# Patient Record
Sex: Female | Born: 1975 | Race: Black or African American | Hispanic: No | Marital: Single | Smoking: Current every day smoker
Health system: Southern US, Community
[De-identification: ages and names within clinical notes are randomized; demographics above are authoritative.]

## PROBLEM LIST (undated history)

## (undated) DIAGNOSIS — Z720 Tobacco use: Secondary | ICD-10-CM

## (undated) DIAGNOSIS — D61818 Other pancytopenia: Secondary | ICD-10-CM

## (undated) DIAGNOSIS — J449 Chronic obstructive pulmonary disease, unspecified: Secondary | ICD-10-CM

## (undated) DIAGNOSIS — N183 Chronic kidney disease, stage 3 unspecified: Secondary | ICD-10-CM

## (undated) DIAGNOSIS — B20 Human immunodeficiency virus [HIV] disease: Secondary | ICD-10-CM

## (undated) DIAGNOSIS — N186 End stage renal disease: Secondary | ICD-10-CM

## (undated) DIAGNOSIS — Z91199 Patient's noncompliance with other medical treatment and regimen due to unspecified reason: Secondary | ICD-10-CM

## (undated) DIAGNOSIS — I429 Cardiomyopathy, unspecified: Secondary | ICD-10-CM

## (undated) DIAGNOSIS — I420 Dilated cardiomyopathy: Secondary | ICD-10-CM

## (undated) DIAGNOSIS — I5020 Unspecified systolic (congestive) heart failure: Secondary | ICD-10-CM

## (undated) DIAGNOSIS — K7460 Unspecified cirrhosis of liver: Secondary | ICD-10-CM

## (undated) DIAGNOSIS — R9431 Abnormal electrocardiogram [ECG] [EKG]: Secondary | ICD-10-CM

## (undated) DIAGNOSIS — Z992 Dependence on renal dialysis: Secondary | ICD-10-CM

## (undated) DIAGNOSIS — I447 Left bundle-branch block, unspecified: Secondary | ICD-10-CM

## (undated) DIAGNOSIS — K769 Liver disease, unspecified: Secondary | ICD-10-CM

## (undated) DIAGNOSIS — I509 Heart failure, unspecified: Secondary | ICD-10-CM

## (undated) DIAGNOSIS — Z7901 Long term (current) use of anticoagulants: Secondary | ICD-10-CM

## (undated) DIAGNOSIS — I10 Essential (primary) hypertension: Secondary | ICD-10-CM

## (undated) DIAGNOSIS — K219 Gastro-esophageal reflux disease without esophagitis: Secondary | ICD-10-CM

## (undated) DIAGNOSIS — U071 COVID-19: Secondary | ICD-10-CM

## (undated) DIAGNOSIS — I749 Embolism and thrombosis of unspecified artery: Secondary | ICD-10-CM

## (undated) DIAGNOSIS — B1920 Unspecified viral hepatitis C without hepatic coma: Secondary | ICD-10-CM

## (undated) DIAGNOSIS — G4700 Insomnia, unspecified: Secondary | ICD-10-CM

## (undated) DIAGNOSIS — R161 Splenomegaly, not elsewhere classified: Secondary | ICD-10-CM

## (undated) HISTORY — DX: Unspecified systolic (congestive) heart failure: I50.20

## (undated) HISTORY — DX: Tobacco use: Z72.0

## (undated) HISTORY — DX: Embolism and thrombosis of unspecified artery: I74.9

## (undated) HISTORY — DX: Left bundle-branch block, unspecified: I44.7

## (undated) HISTORY — DX: Other pancytopenia: D61.818

## (undated) HISTORY — PX: CHOLECYSTECTOMY: SHX55

## (undated) HISTORY — DX: Human immunodeficiency virus (HIV) disease: B20

## (undated) HISTORY — DX: Chronic kidney disease, stage 3 unspecified: N18.30

## (undated) HISTORY — DX: Cardiomyopathy, unspecified: I42.9

---

## 2013-06-15 DIAGNOSIS — Z21 Asymptomatic human immunodeficiency virus [HIV] infection status: Secondary | ICD-10-CM

## 2013-06-15 DIAGNOSIS — B20 Human immunodeficiency virus [HIV] disease: Secondary | ICD-10-CM

## 2013-06-15 HISTORY — DX: Asymptomatic human immunodeficiency virus (hiv) infection status: Z21

## 2013-06-15 HISTORY — DX: Human immunodeficiency virus (HIV) disease: B20

## 2013-11-26 DIAGNOSIS — I5043 Acute on chronic combined systolic (congestive) and diastolic (congestive) heart failure: Secondary | ICD-10-CM | POA: Insufficient documentation

## 2013-11-26 DIAGNOSIS — I509 Heart failure, unspecified: Secondary | ICD-10-CM | POA: Insufficient documentation

## 2013-11-26 DIAGNOSIS — I10 Essential (primary) hypertension: Secondary | ICD-10-CM | POA: Insufficient documentation

## 2013-11-26 DIAGNOSIS — I5023 Acute on chronic systolic (congestive) heart failure: Secondary | ICD-10-CM | POA: Insufficient documentation

## 2013-11-27 DIAGNOSIS — R161 Splenomegaly, not elsewhere classified: Secondary | ICD-10-CM | POA: Insufficient documentation

## 2013-11-27 DIAGNOSIS — J189 Pneumonia, unspecified organism: Secondary | ICD-10-CM | POA: Insufficient documentation

## 2013-11-27 DIAGNOSIS — E871 Hypo-osmolality and hyponatremia: Secondary | ICD-10-CM | POA: Insufficient documentation

## 2013-11-27 DIAGNOSIS — R591 Generalized enlarged lymph nodes: Secondary | ICD-10-CM | POA: Insufficient documentation

## 2013-11-27 DIAGNOSIS — E876 Hypokalemia: Secondary | ICD-10-CM | POA: Insufficient documentation

## 2013-11-28 DIAGNOSIS — Z21 Asymptomatic human immunodeficiency virus [HIV] infection status: Secondary | ICD-10-CM | POA: Insufficient documentation

## 2013-11-28 DIAGNOSIS — Z720 Tobacco use: Secondary | ICD-10-CM | POA: Insufficient documentation

## 2013-11-28 DIAGNOSIS — B20 Human immunodeficiency virus [HIV] disease: Secondary | ICD-10-CM | POA: Insufficient documentation

## 2013-11-29 DIAGNOSIS — B1920 Unspecified viral hepatitis C without hepatic coma: Secondary | ICD-10-CM | POA: Insufficient documentation

## 2015-03-14 DIAGNOSIS — Z9119 Patient's noncompliance with other medical treatment and regimen: Secondary | ICD-10-CM | POA: Insufficient documentation

## 2015-03-14 DIAGNOSIS — Z91199 Patient's noncompliance with other medical treatment and regimen due to unspecified reason: Secondary | ICD-10-CM | POA: Insufficient documentation

## 2015-03-14 DIAGNOSIS — R9431 Abnormal electrocardiogram [ECG] [EKG]: Secondary | ICD-10-CM | POA: Insufficient documentation

## 2015-03-14 DIAGNOSIS — N179 Acute kidney failure, unspecified: Secondary | ICD-10-CM | POA: Insufficient documentation

## 2015-03-14 DIAGNOSIS — D696 Thrombocytopenia, unspecified: Secondary | ICD-10-CM | POA: Insufficient documentation

## 2015-07-12 DIAGNOSIS — R809 Proteinuria, unspecified: Secondary | ICD-10-CM | POA: Insufficient documentation

## 2016-03-03 DIAGNOSIS — R87610 Atypical squamous cells of undetermined significance on cytologic smear of cervix (ASC-US): Secondary | ICD-10-CM | POA: Insufficient documentation

## 2016-03-03 DIAGNOSIS — N879 Dysplasia of cervix uteri, unspecified: Secondary | ICD-10-CM | POA: Insufficient documentation

## 2017-03-17 DIAGNOSIS — R519 Headache, unspecified: Secondary | ICD-10-CM | POA: Insufficient documentation

## 2017-08-07 ENCOUNTER — Inpatient Hospital Stay: Payer: Self-pay

## 2017-08-07 ENCOUNTER — Encounter: Payer: Self-pay | Admitting: Emergency Medicine

## 2017-08-07 ENCOUNTER — Other Ambulatory Visit: Payer: Self-pay

## 2017-08-07 ENCOUNTER — Emergency Department: Payer: Self-pay

## 2017-08-07 ENCOUNTER — Inpatient Hospital Stay
Admission: EM | Admit: 2017-08-07 | Discharge: 2017-08-09 | DRG: 292 | Disposition: A | Discharge status: Home / Self Care | Payer: Medicaid Other | Attending: Internal Medicine | Admitting: Internal Medicine

## 2017-08-07 DIAGNOSIS — N179 Acute kidney failure, unspecified: Secondary | ICD-10-CM | POA: Diagnosis present

## 2017-08-07 DIAGNOSIS — I5022 Chronic systolic (congestive) heart failure: Secondary | ICD-10-CM | POA: Diagnosis present

## 2017-08-07 DIAGNOSIS — F17200 Nicotine dependence, unspecified, uncomplicated: Secondary | ICD-10-CM | POA: Diagnosis present

## 2017-08-07 DIAGNOSIS — E875 Hyperkalemia: Secondary | ICD-10-CM | POA: Diagnosis present

## 2017-08-07 DIAGNOSIS — I447 Left bundle-branch block, unspecified: Secondary | ICD-10-CM | POA: Diagnosis present

## 2017-08-07 DIAGNOSIS — Z9119 Patient's noncompliance with other medical treatment and regimen: Secondary | ICD-10-CM

## 2017-08-07 DIAGNOSIS — I10 Essential (primary) hypertension: Secondary | ICD-10-CM | POA: Diagnosis present

## 2017-08-07 DIAGNOSIS — R918 Other nonspecific abnormal finding of lung field: Secondary | ICD-10-CM | POA: Diagnosis present

## 2017-08-07 DIAGNOSIS — Z79899 Other long term (current) drug therapy: Secondary | ICD-10-CM

## 2017-08-07 DIAGNOSIS — Z882 Allergy status to sulfonamides status: Secondary | ICD-10-CM

## 2017-08-07 DIAGNOSIS — R0789 Other chest pain: Secondary | ICD-10-CM | POA: Diagnosis present

## 2017-08-07 DIAGNOSIS — R809 Proteinuria, unspecified: Secondary | ICD-10-CM | POA: Diagnosis present

## 2017-08-07 DIAGNOSIS — Z9114 Patient's other noncompliance with medication regimen: Secondary | ICD-10-CM

## 2017-08-07 DIAGNOSIS — Z8619 Personal history of other infectious and parasitic diseases: Secondary | ICD-10-CM

## 2017-08-07 DIAGNOSIS — I429 Cardiomyopathy, unspecified: Secondary | ICD-10-CM | POA: Diagnosis present

## 2017-08-07 DIAGNOSIS — Z862 Personal history of diseases of the blood and blood-forming organs and certain disorders involving the immune mechanism: Secondary | ICD-10-CM

## 2017-08-07 DIAGNOSIS — R9431 Abnormal electrocardiogram [ECG] [EKG]: Secondary | ICD-10-CM

## 2017-08-07 DIAGNOSIS — I160 Hypertensive urgency: Secondary | ICD-10-CM | POA: Diagnosis present

## 2017-08-07 DIAGNOSIS — I130 Hypertensive heart and chronic kidney disease with heart failure and stage 1 through stage 4 chronic kidney disease, or unspecified chronic kidney disease: Principal | ICD-10-CM | POA: Diagnosis present

## 2017-08-07 DIAGNOSIS — Z21 Asymptomatic human immunodeficiency virus [HIV] infection status: Secondary | ICD-10-CM | POA: Diagnosis present

## 2017-08-07 DIAGNOSIS — N183 Chronic kidney disease, stage 3 (moderate): Secondary | ICD-10-CM | POA: Diagnosis present

## 2017-08-07 HISTORY — DX: Essential (primary) hypertension: I10

## 2017-08-07 LAB — POCT PREGNANCY, URINE: PREG TEST UR: NEGATIVE

## 2017-08-07 LAB — POTASSIUM: POTASSIUM: 4.3 mmol/L (ref 3.5–5.1)

## 2017-08-07 LAB — BASIC METABOLIC PANEL
Anion gap: 5 (ref 5–15)
BUN: 32 mg/dL — AB (ref 6–20)
CHLORIDE: 108 mmol/L (ref 101–111)
CO2: 21 mmol/L — ABNORMAL LOW (ref 22–32)
Calcium: 8.6 mg/dL — ABNORMAL LOW (ref 8.9–10.3)
Creatinine, Ser: 1.9 mg/dL — ABNORMAL HIGH (ref 0.44–1.00)
GFR calc Af Amer: 37 mL/min — ABNORMAL LOW (ref >60)
GFR calc non Af Amer: 32 mL/min — ABNORMAL LOW (ref >60)
Glucose, Bld: 89 mg/dL (ref 65–99)
Potassium: 5.9 mmol/L — ABNORMAL HIGH (ref 3.5–5.1)
SODIUM: 134 mmol/L — AB (ref 135–145)

## 2017-08-07 LAB — URINALYSIS, COMPLETE (UACMP) WITH MICROSCOPIC
Bacteria, UA: NONE SEEN
Bilirubin Urine: NEGATIVE
Glucose, UA: NEGATIVE mg/dL
KETONES UR: NEGATIVE mg/dL
LEUKOCYTES UA: NEGATIVE
Nitrite: NEGATIVE
PROTEIN: 30 mg/dL — AB
Specific Gravity, Urine: 1.011 (ref 1.005–1.030)
pH: 6 (ref 5.0–8.0)

## 2017-08-07 LAB — CBC
HCT: 39.7 % (ref 35.0–47.0)
HCT: 39.7 % (ref 35.0–47.0)
Hemoglobin: 13.4 g/dL (ref 12.0–16.0)
Hemoglobin: 13.3 g/dL (ref 12.0–16.0)
MCH: 34.3 pg — ABNORMAL HIGH (ref 26.0–34.0)
MCH: 33.6 pg (ref 26.0–34.0)
MCHC: 33.8 g/dL (ref 32.0–36.0)
MCHC: 33.5 g/dL (ref 32.0–36.0)
MCV: 101.3 fL — AB (ref 80.0–100.0)
MCV: 100.4 fL — AB (ref 80.0–100.0)
PLATELETS: 112 10*3/uL — AB (ref 150–440)
Platelets: 126 10*3/uL — ABNORMAL LOW (ref 150–440)
RBC: 3.92 MIL/uL (ref 3.80–5.20)
RBC: 3.96 MIL/uL (ref 3.80–5.20)
RDW: 13.9 % (ref 11.5–14.5)
RDW: 14.2 % (ref 11.5–14.5)
WBC: 4.9 10*3/uL (ref 3.6–11.0)
WBC: 6.6 10*3/uL (ref 3.6–11.0)

## 2017-08-07 LAB — TROPONIN I
Troponin I: <0.03 ng/mL (ref <0.03)
Troponin I: <0.03 ng/mL (ref <0.03)

## 2017-08-07 LAB — CREATININE, SERUM
Creatinine, Ser: 1.53 mg/dL — ABNORMAL HIGH (ref 0.44–1.00)
GFR calc Af Amer: 48 mL/min — ABNORMAL LOW (ref >60)
GFR calc non Af Amer: 42 mL/min — ABNORMAL LOW (ref >60)

## 2017-08-07 LAB — URINE DRUG SCREEN, QUALITATIVE (ARMC ONLY)
AMPHETAMINES, UR SCREEN: NOT DETECTED
BARBITURATES, UR SCREEN: NOT DETECTED
BENZODIAZEPINE, UR SCRN: NOT DETECTED
CANNABINOID 50 NG, UR ~~LOC~~: POSITIVE — AB
Cocaine Metabolite,Ur ~~LOC~~: NOT DETECTED
MDMA (Ecstasy)Ur Screen: NOT DETECTED
Methadone Scn, Ur: NOT DETECTED
Opiate, Ur Screen: NOT DETECTED
Phencyclidine (PCP) Ur S: NOT DETECTED
Tricyclic, Ur Screen: NOT DETECTED

## 2017-08-07 LAB — LIPASE, BLOOD
LIPASE: 57 U/L — AB (ref 11–51)
Lipase: 50 U/L (ref 11–51)

## 2017-08-07 LAB — HEPATIC FUNCTION PANEL
ALBUMIN: 3.7 g/dL (ref 3.5–5.0)
ALK PHOS: 57 U/L (ref 38–126)
ALT: 24 U/L (ref 14–54)
AST: 35 U/L (ref 15–41)
BILIRUBIN TOTAL: 0.6 mg/dL (ref 0.3–1.2)
Bilirubin, Direct: <0.1 mg/dL — ABNORMAL LOW (ref 0.1–0.5)
TOTAL PROTEIN: 8.8 g/dL — AB (ref 6.5–8.1)

## 2017-08-07 LAB — BRAIN NATRIURETIC PEPTIDE: B Natriuretic Peptide: 378 pg/mL — ABNORMAL HIGH (ref 0.0–100.0)

## 2017-08-07 LAB — GLUCOSE, CAPILLARY
GLUCOSE-CAPILLARY: 99 mg/dL (ref 65–99)
Glucose-Capillary: 58 mg/dL — ABNORMAL LOW (ref 65–99)

## 2017-08-07 LAB — PROCALCITONIN

## 2017-08-07 MED ORDER — CALCIUM GLUCONATE 10 % IV SOLN
1.0000 g | Freq: Once | INTRAVENOUS | Status: AC
Start: 2017-08-07 — End: 2017-08-07
  Administered 2017-08-07: 1 g via INTRAVENOUS
  Filled 2017-08-07: qty 10

## 2017-08-07 MED ORDER — ASPIRIN EC 325 MG PO TBEC
325.0000 mg | DELAYED_RELEASE_TABLET | Freq: Every day | ORAL | Status: DC
  Administered 2017-08-07 – 2017-08-09 (×3): 325 mg via ORAL
  Filled 2017-08-07 (×3): qty 1

## 2017-08-07 MED ORDER — SODIUM CHLORIDE 0.9% FLUSH
3.0000 mL | Freq: Two times a day (BID) | INTRAVENOUS | Status: DC
  Administered 2017-08-07 – 2017-08-09 (×4): 3 mL via INTRAVENOUS

## 2017-08-07 MED ORDER — SODIUM BICARBONATE 8.4 % IV SOLN
50.0000 meq | Freq: Once | INTRAVENOUS | Status: AC
  Administered 2017-08-07: 50 meq via INTRAVENOUS
  Filled 2017-08-07: qty 50

## 2017-08-07 MED ORDER — SODIUM CHLORIDE 0.9% FLUSH
3.0000 mL | INTRAVENOUS | Status: DC | PRN
  Administered 2017-08-08: 3 mL via INTRAVENOUS
  Filled 2017-08-07: qty 3

## 2017-08-07 MED ORDER — DEXTROSE 50 % IV SOLN
1.0000 | Freq: Once | INTRAVENOUS | Status: AC
  Administered 2017-08-07: 50 mL via INTRAVENOUS
  Filled 2017-08-07: qty 50

## 2017-08-07 MED ORDER — HYDROCODONE-ACETAMINOPHEN 5-325 MG PO TABS
1.0000 | ORAL_TABLET | ORAL | Status: DC | PRN
  Administered 2017-08-08 – 2017-08-09 (×3): 2 via ORAL
  Filled 2017-08-07: qty 2
  Filled 2017-08-07 (×2): qty 1
  Filled 2017-08-07: qty 2

## 2017-08-07 MED ORDER — SODIUM POLYSTYRENE SULFONATE 15 GM/60ML PO SUSP
30.0000 g | Freq: Once | ORAL | Status: AC
  Administered 2017-08-07: 30 g via ORAL
  Filled 2017-08-07: qty 120

## 2017-08-07 MED ORDER — AMLODIPINE BESYLATE 10 MG PO TABS
10.0000 mg | ORAL_TABLET | Freq: Every day | ORAL | Status: DC
  Administered 2017-08-07 – 2017-08-09 (×3): 10 mg via ORAL
  Filled 2017-08-07: qty 2
  Filled 2017-08-07 (×2): qty 1

## 2017-08-07 MED ORDER — SODIUM CHLORIDE 0.9 % IV SOLN: 250.0000 mL | INTRAVENOUS | Status: DC | PRN

## 2017-08-07 MED ORDER — MORPHINE SULFATE (PF) 2 MG/ML IV SOLN: 2.0000 mg | INTRAVENOUS | Status: DC | PRN

## 2017-08-07 MED ORDER — HYDRALAZINE HCL 20 MG/ML IJ SOLN
10.0000 mg | INTRAMUSCULAR | Status: DC | PRN
  Administered 2017-08-07: 10 mg via INTRAVENOUS
  Filled 2017-08-07: qty 1

## 2017-08-07 MED ORDER — CLONIDINE HCL 0.1 MG PO TABS: 0.1000 mg | ORAL_TABLET | Freq: Once | ORAL | Status: DC

## 2017-08-07 MED ORDER — HEPARIN SODIUM (PORCINE) 5000 UNIT/ML IJ SOLN
5000.0000 [IU] | Freq: Three times a day (TID) | INTRAMUSCULAR | Status: DC
  Administered 2017-08-07 – 2017-08-09 (×6): 5000 [IU] via SUBCUTANEOUS
  Filled 2017-08-07 (×6): qty 1

## 2017-08-07 MED ORDER — NITROGLYCERIN 0.4 MG SL SUBL
0.4000 mg | SUBLINGUAL_TABLET | SUBLINGUAL | Status: DC | PRN
Start: 2017-08-07 — End: 2017-08-09

## 2017-08-07 MED ORDER — HYDRALAZINE HCL 50 MG PO TABS
50.0000 mg | ORAL_TABLET | ORAL | Status: AC
  Administered 2017-08-07: 50 mg via ORAL
  Filled 2017-08-07: qty 1

## 2017-08-07 MED ORDER — ONDANSETRON HCL 4 MG/2ML IJ SOLN: 4.0000 mg | Freq: Four times a day (QID) | INTRAMUSCULAR | Status: DC | PRN

## 2017-08-07 MED ORDER — HYDRALAZINE HCL 20 MG/ML IJ SOLN
20.0000 mg | INTRAMUSCULAR | Status: DC | PRN
  Administered 2017-08-07 – 2017-08-08 (×2): 20 mg via INTRAVENOUS
  Filled 2017-08-07 (×2): qty 1

## 2017-08-07 MED ORDER — NICOTINE 14 MG/24HR TD PT24
14.0000 mg | MEDICATED_PATCH | Freq: Every day | TRANSDERMAL | Status: DC
  Administered 2017-08-07 – 2017-08-09 (×3): 14 mg via TRANSDERMAL
  Filled 2017-08-07 (×3): qty 1

## 2017-08-07 MED ORDER — CARVEDILOL 12.5 MG PO TABS
12.5000 mg | ORAL_TABLET | Freq: Two times a day (BID) | ORAL | Status: DC
  Administered 2017-08-07 – 2017-08-08 (×2): 12.5 mg via ORAL
  Filled 2017-08-07 (×2): qty 1

## 2017-08-07 MED ORDER — ACETAMINOPHEN 325 MG PO TABS
650.0000 mg | ORAL_TABLET | Freq: Four times a day (QID) | ORAL | Status: DC | PRN
Start: 2017-08-07 — End: 2017-08-09
  Filled 2017-08-07: qty 2

## 2017-08-07 MED ORDER — INSULIN ASPART 100 UNIT/ML ~~LOC~~ SOLN
4.0000 [IU] | Freq: Once | SUBCUTANEOUS | Status: AC
  Administered 2017-08-07: 4 [IU] via INTRAVENOUS
  Filled 2017-08-07: qty 1

## 2017-08-07 MED ORDER — ACETAMINOPHEN 650 MG RE SUPP: 650.0000 mg | Freq: Four times a day (QID) | RECTAL | Status: DC | PRN

## 2017-08-07 MED ORDER — ONDANSETRON HCL 4 MG PO TABS: 4.0000 mg | ORAL_TABLET | Freq: Four times a day (QID) | ORAL | Status: DC | PRN

## 2017-08-07 MED ORDER — DOCUSATE SODIUM 100 MG PO CAPS
100.0000 mg | ORAL_CAPSULE | Freq: Two times a day (BID) | ORAL | Status: DC
  Filled 2017-08-07 (×3): qty 1

## 2017-08-07 NOTE — Consult Note (Signed)
Reason for Consult: Chest pain Referring Physician: [facility] hospitalist primary care at [place] is an 42 y.o. female.  HPI: Patient 42 year old female known congestive heart failure hepatitis C noncompliance chronic renal insufficiency smoking severe hypertension reportedly complains of poorly controlled hypertension over the last 5 days.  Patient also complains of vague left chest pressure made better with activity and ambulation worse with sitting still.  Denies much in way of shortness of breath or diaphoresis but came to the emergency room and most recent EKG now suggested left bundle branch block which had not been noticed before.  Patient also also was found to have hyperkalemia denies any known coronary disease.  Past Medical History:  Diagnosis Date  . Hypertension     Past Surgical History:  Procedure Laterality Date  . CHOLECYSTECTOMY      No family history on file.  Social History:  reports that she has been smoking.  she has never used smokeless tobacco. She reports that she does not drink alcohol or use drugs.  Allergies:  Allergies  Allergen Reactions  . Sulfa Antibiotics     Medications: I have reviewed the patient's current medications.  Results for orders placed or performed during the hospital encounter of 08/07/17 (from the past 48 hour(s))  Basic metabolic panel     Status: Abnormal   Collection Time: 08/07/17 10:20 AM  Result Value Ref Range   Sodium 134 (L) 135 - 145 mmol/L   Potassium 5.9 (H) 3.5 - 5.1 mmol/L   Chloride 108 101 - 111 mmol/L   CO2 21 (L) 22 - 32 mmol/L   Glucose, Bld 89 65 - 99 mg/dL   BUN 32 (H) 6 - 20 mg/dL   Creatinine, Ser 1.90 (H) 0.44 - 1.00 mg/dL   Calcium 8.6 (L) 8.9 - 10.3 mg/dL   GFR calc non Af Amer 32 (L) >60 mL/min   GFR calc Af Amer 37 (L) >60 mL/min    Comment: (NOTE) The eGFR has been calculated using the CKD EPI equation. This calculation has not been validated in all clinical situations. eGFR's  persistently <60 mL/min signify possible Chronic Kidney Disease.    Anion gap 5 5 - 15    Comment: Performed at [facility], [address] [place], [place] [address]  CBC     Status: Abnormal   Collection Time: 08/07/17 10:20 AM  Result Value Ref Range   WBC 4.9 3.6 - 11.0 K/uL   RBC 3.92 3.80 - 5.20 MIL/uL   Hemoglobin 13.4 12.0 - 16.0 g/dL   HCT 39.7 35.0 - 47.0 %   MCV 101.3 (H) 80.0 - 100.0 fL   MCH 34.3 (H) 26.0 - 34.0 pg   MCHC 33.8 32.0 - 36.0 g/dL   RDW 13.9 11.5 - 14.5 %   Platelets 126 (L) 150 - 440 K/uL    Comment: Performed at [facility], [address] [place], [place] [address]  Troponin I     Status: None   Collection Time: 08/07/17 10:20 AM  Result Value Ref Range   Troponin I <0.03 <0.03 ng/mL    Comment: Performed at [facility], [address] [place], [place] [address]  Hepatic function panel     Status: Abnormal   Collection Time: 08/07/17 10:20 AM  Result Value Ref Range   Total Protein 8.8 (H) 6.5 - 8.1 g/dL   Albumin 3.7 3.5 - 5.0 g/dL   AST 35 15 - 41 U/L  ALT 24 14 - 54 U/L   Alkaline Phosphatase 57 38 - 126 U/L   Total Bilirubin 0.6 0.3 - 1.2 mg/dL   Bilirubin, Direct <0.1 (L) 0.1 - 0.5 mg/dL   Indirect Bilirubin NOT CALCULATED 0.3 - 0.9 mg/dL    Comment: Performed at [facility], [address] [place], [place] [address]  Lipase, blood     Status: None   Collection Time: 08/07/17 10:20 AM  Result Value Ref Range   Lipase 50 11 - 51 U/L    Comment: Performed at [facility], [address] [place], [place] [address]  Urinalysis, Complete w Microscopic     Status: Abnormal   Collection Time: 08/07/17 11:35 AM  Result Value Ref Range   Color, Urine YELLOW (A) YELLOW   APPearance CLEAR (A) CLEAR   Specific Gravity, Urine 1.011 1.005 - 1.030   pH 6.0 5.0 - 8.0   Glucose, UA NEGATIVE NEGATIVE mg/dL   Hgb urine dipstick MODERATE (A) NEGATIVE   Bilirubin Urine NEGATIVE NEGATIVE    Ketones, ur NEGATIVE NEGATIVE mg/dL   Protein, ur 30 (A) NEGATIVE mg/dL   Nitrite NEGATIVE NEGATIVE   Leukocytes, UA NEGATIVE NEGATIVE   RBC / HPF 0-5 0 - 5 RBC/hpf   WBC, UA 0-5 0 - 5 WBC/hpf   Bacteria, UA NONE SEEN NONE SEEN   Squamous Epithelial / LPF 0-5 (A) NONE SEEN    Comment: Performed at [facility], [address] [place], [place] [address]  Pregnancy, urine POC     Status: None   Collection Time: 08/07/17 12:37 PM  Result Value Ref Range   Preg Test, Ur NEGATIVE NEGATIVE    Comment:        THE SENSITIVITY OF THIS METHODOLOGY IS >24 mIU/mL   Glucose, capillary     Status: Abnormal   Collection Time: 08/07/17  1:02 PM  Result Value Ref Range   Glucose-Capillary 58 (L) 65 - 99 mg/dL  Potassium     Status: None   Collection Time: 08/07/17  3:30 PM  Result Value Ref Range   Potassium 4.3 3.5 - 5.1 mmol/L    Comment: Performed at [facility], [address] [place], [place] [address]  Lipase, blood     Status: Abnormal   Collection Time: 08/07/17  3:30 PM  Result Value Ref Range   Lipase 57 (H) 11 - 51 U/L    Comment: Performed at [facility], [address] [place], [place] [address]  CBC     Status: Abnormal   Collection Time: 08/07/17  3:30 PM  Result Value Ref Range   WBC 6.6 3.6 - 11.0 K/uL   RBC 3.96 3.80 - 5.20 MIL/uL   Hemoglobin 13.3 12.0 - 16.0 g/dL   HCT 39.7 35.0 - 47.0 %   MCV 100.4 (H) 80.0 - 100.0 fL   MCH 33.6 26.0 - 34.0 pg   MCHC 33.5 32.0 - 36.0 g/dL   RDW 14.2 11.5 - 14.5 %   Platelets 112 (L) 150 - 440 K/uL    Comment: Performed at [facility], [address] [place], [place] [address]  Creatinine, serum     Status: Abnormal   Collection Time: 08/07/17  3:30 PM  Result Value Ref Range   Creatinine, Ser 1.53 (H) 0.44 - 1.00 mg/dL   GFR calc non Af Amer 42 (L) >60 mL/min   GFR calc Af Amer 48 (L) >60 mL/min    Comment: (NOTE) The  eGFR has been calculated using the CKD EPI equation. This  calculation has not been validated in all clinical situations. eGFR's persistently <60 mL/min signify possible Chronic Kidney Disease. Performed at [facility], [address] [place], [place] [address]   Troponin I     Status: None   Collection Time: 08/07/17  3:30 PM  Result Value Ref Range   Troponin I <0.03 <0.03 ng/mL    Comment: Performed at [facility], [address] [place], [place] [address]  Brain natriuretic peptide     Status: Abnormal   Collection Time: 08/07/17  3:30 PM  Result Value Ref Range   B Natriuretic Peptide 378.0 (H) 0.0 - 100.0 pg/mL    Comment: Performed at [facility], [address] [place], [place] [address]  Procalcitonin - Baseline     Status: None   Collection Time: 08/07/17  3:30 PM  Result Value Ref Range   Procalcitonin <0.10 ng/mL    Comment:        Interpretation: PCT (Procalcitonin) <= 0.5 ng/mL: Systemic infection (sepsis) is not likely. Local bacterial infection is possible. (NOTE)       Sepsis PCT Algorithm           Lower Respiratory Tract                                      Infection PCT Algorithm    ----------------------------     ----------------------------         PCT < 0.25 ng/mL                PCT < 0.10 ng/mL         Strongly encourage             Strongly discourage   discontinuation of antibiotics    initiation of antibiotics    ----------------------------     -----------------------------       PCT 0.25 - 0.50 ng/mL            PCT 0.10 - 0.25 ng/mL               OR       >80% decrease in PCT            Discourage initiation of                                            antibiotics      Encourage discontinuation           of antibiotics    ----------------------------     -----------------------------         PCT >= 0.50 ng/mL              PCT 0.26 - 0.50 ng/mL               AND        <80% decrease in PCT             Encourage initiation of                                              antibiotics  Encourage continuation           of antibiotics    ----------------------------     -----------------------------        PCT >= 0.50 ng/mL                  PCT > 0.50 ng/mL               AND         increase in PCT                  Strongly encourage                                      initiation of antibiotics    Strongly encourage escalation           of antibiotics                                     -----------------------------                                           PCT <= 0.25 ng/mL                                                 OR                                        > 80% decrease in PCT                                     Discontinue / Do not initiate                                             antibiotics Performed at [facility], [address] [place], [place] [address]   Glucose, capillary     Status: None   Collection Time: 08/07/17  5:24 PM  Result Value Ref Range   Glucose-Capillary 99 65 - 99 mg/dL    Dg Chest 2 View  Result Date: 08/07/2017 CLINICAL DATA:  Left chest pain EXAM: CHEST  2 VIEW COMPARISON:  None FINDINGS: Indistinct asymmetric opacities at the left lung base best appreciated on the frontal projection. The lungs appear otherwise clear. Cardiac and mediastinal margins appear normal. No pleural effusion. IMPRESSION: 1. Indistinct streaky opacities at the left lung base. The appearance is nonspecific but early bronchopneumonia is a possibility. Electronically Signed   By: Van Clines M.D.   On: 08/07/2017 11:12   Ct Chest Wo Contrast  Result Date: 08/07/2017 CLINICAL DATA:  42 year old female with LEFT chest pain for 4 days. EXAM: CT CHEST WITHOUT CONTRAST TECHNIQUE: Multidetector CT imaging of the chest was performed following the standard protocol without IV contrast. COMPARISON:  08/07/2017 chest radiograph FINDINGS:  Cardiovascular: Heart size normal. No evidence of thoracic aortic aneurysm or pericardial  effusion. Mediastinum/Nodes: No enlarged mediastinal or axillary lymph nodes. Thyroid gland, trachea, and esophagus demonstrate no significant findings. Lungs/Pleura: Lungs are clear. No pleural effusion or pneumothorax. Upper Abdomen: No acute abnormality. Musculoskeletal: No chest wall mass or suspicious bone lesions identified. IMPRESSION: Unremarkable noncontrast chest CT.  No evidence of pneumonia. Electronically Signed   By: Margarette Canada M.D.   On: 08/07/2017 18:49    Review of Systems  Constitutional: Positive for malaise/fatigue.  HENT: Positive for congestion.   Eyes: Negative.   Respiratory: Positive for shortness of breath.   Cardiovascular: Positive for chest pain.  Gastrointestinal: Positive for heartburn.  Genitourinary: Negative.   Musculoskeletal: Positive for myalgias.  Skin: Negative.   Neurological: Positive for weakness.  Endo/Heme/Allergies: Negative.   Psychiatric/Behavioral: Negative.    Blood pressure (!) 171/97, pulse 93, temperature 98.6 F (37 C), temperature source Oral, resp. rate 18, height 5' 3"  (1.6 m), weight 142 lb 11.2 oz (64.7 kg), last menstrual period 08/06/2017, SpO2 100 %. Physical Exam  Nursing note and vitals reviewed. Constitutional: She is oriented to person, place, and time. She appears well-developed and well-nourished.  HENT:  Head: Normocephalic and atraumatic.  Eyes: Conjunctivae and EOM are normal. Pupils are equal, round, and reactive to light.  Neck: Normal range of motion. Neck supple.  Cardiovascular: Normal rate and regular rhythm.  Murmur heard. Respiratory: Effort normal and breath sounds normal.  GI: Soft. Bowel sounds are normal.  Musculoskeletal: Normal range of motion.  Neurological: She is alert and oriented to person, place, and time. She has normal reflexes.  Skin: Skin is warm and dry.  Psychiatric: She has a normal mood and affect.    Assessment/Plan: Chest pain Congestive heart failure by history  compensated Hypertension Hepatitis C Chronic renal insufficiency Hyperkalemia New left bundle branch block Abnormal chest x-ray Smoking . Plan Agree with admit to telemetry follow-up EKGs troponin Consider echocardiogram for assessment of left ventricular function Consider nephrology consult for renal insufficiency Atypical chest pain unclear consider functional study do not recommend cardiac cath Recommend patient refrain from tobacco abuse Treat hyperkalemia with Kayexalate Consider functional study either as an inpatient outpatient  Laiden Milles D Tracy Gerken 08/07/2017, 7:39 PM

## 2017-08-07 NOTE — ED Notes (Signed)
Attempted to call report and was advised that nurse would need to return the call

## 2017-08-07 NOTE — ED Notes (Signed)
Admitting doctor notified of pt BP and he states that he will increase apresoline orders

## 2017-08-07 NOTE — H&P (Signed)
[place] [facility] [place] NAME: Adaliah Hiegel    MR#:  [id number]  DATE OF BIRTH:  1976-04-08  DATE OF ADMISSION:  08/07/2017  PRIMARY CARE PHYSICIAN: No primary care provider on file.   REQUESTING/REFERRING PHYSICIAN:   CHIEF COMPLAINT:   Chief Complaint  Patient presents with  . chest discomfort  Informant-the patient, the patient's significant other, medical records from [place]: Karigan Cloninger  is a 42 y.o. female with a known history systolic congestive heart failure, hepatitis C, HIV diagnosed in 2015-patient denies this, noncompliance of medical management, chronic kidney disease with proteinuria, chronic tobacco smoking abuse/dependency, splenomegaly, severe hypertension, presented to the emergency room with 4-5-day history of left chest pressure/pain, made better with activity/ambulation, worse when sitting still, not associated with shortness of breath or diaphoresis, in the emergency room patient was found to have new left bundle branch block on EKG with peaked T waves, chest x-ray noted for left early pneumonia, potassium 5.9, creatinine 1.9-baseline unknown, no other records in our system other than today's studies, we do have records via care everywhere with [facility], patient evaluated in the emergency room with her significant other, patient stated that she moved to the area within the last month, patient is now been admitted for acute malignant hypertension, atypical chest pain with new left bundle branch block, abnormal x-ray of the chest, and hyperkalemia.  PAST MEDICAL HISTORY:   Past Medical History:  Diagnosis Date  . Hypertension   See HPI  PAST SURGICAL HISTORY:  Past Surgical History:  Procedure Laterality Date  . CHOLECYSTECTOMY      SOCIAL HISTORY:  Social History   Tobacco Use  . Smoking status: Current Some Day Smoker  . Smokeless tobacco: Never Used  Substance Use Topics   . Alcohol use: No    Frequency: Never    FAMILY HISTORY:  Hypertension  DRUG ALLERGIES:  Allergies  Allergen Reactions  . Sulfa Antibiotics     REVIEW OF SYSTEMS:   CONSTITUTIONAL: No fever, fatigue or weakness.  EYES: No blurred or double vision.  EARS, NOSE, AND THROAT: No tinnitus or ear pain.  RESPIRATORY: No cough, shortness of breath, wheezing or hemoptysis.  CARDIOVASCULAR: + chest pain,no orthopnea, edema.  GASTROINTESTINAL: No nausea, vomiting, diarrhea or abdominal pain.  GENITOURINARY: No dysuria, hematuria.  ENDOCRINE: No polyuria, nocturia,  HEMATOLOGY: No anemia, easy bruising or bleeding SKIN: No rash or lesion. MUSCULOSKELETAL: No joint pain or arthritis.   NEUROLOGIC: No tingling, numbness, weakness.  PSYCHIATRY: No anxiety or depression.   MEDICATIONS AT HOME:  Prior to Admission medications   Not on File      PHYSICAL EXAMINATION:   VITAL SIGNS: Blood pressure (!) 203/123, pulse 82, temperature 98.6 F (37 C), temperature source Oral, resp. rate 15, weight 60.3 kg (133 lb), last menstrual period 08/06/2017, SpO2 98 %.  GENERAL:  42 y.o.-year-old patient lying in the bed with no acute distress.  EYES: Pupils equal, round, reactive to light and accommodation. No scleral icterus. Extraocular muscles intact.  HEENT: Head atraumatic, normocephalic. Oropharynx and nasopharynx clear.  NECK:  Supple, no jugular venous distention. No thyroid enlargement, no tenderness.  LUNGS: Normal breath sounds bilaterally, no wheezing, rales,rhonchi or crepitation. No use of accessory muscles of respiration.  CARDIOVASCULAR: S1, S2 normal. No murmurs, rubs, or gallops.  ABDOMEN: Soft, nontender, nondistended. Bowel sounds present. No organomegaly or mass.  EXTREMITIES: No pedal edema, cyanosis, or clubbing.  NEUROLOGIC:  Cranial nerves II through XII are intact. Muscle strength 5/5 in all extremities. Sensation intact. Gait not checked.  PSYCHIATRIC: The patient is  alert and oriented x 3.  SKIN: No obvious rash, lesion, or ulcer.   LABORATORY PANEL:   CBC Recent Labs  Lab 08/07/17 1020  WBC 4.9  HGB 13.4  HCT 39.7  PLT 126*  MCV 101.3*  MCH 34.3*  MCHC 33.8  RDW 13.9   ------------------------------------------------------------------------------------------------------------------  Chemistries  Recent Labs  Lab 08/07/17 1020  NA 134*  K 5.9*  CL 108  CO2 21*  GLUCOSE 89  BUN 32*  CREATININE 1.90*  CALCIUM 8.6*  AST 35  ALT 24  ALKPHOS 57  BILITOT 0.6   ------------------------------------------------------------------------------------------------------------------ CrCl cannot be calculated (Unknown ideal weight.). ------------------------------------------------------------------------------------------------------------------ No results for input(s): TSH, T4TOTAL, T3FREE, THYROIDAB in the last 72 hours.  Invalid input(s): FREET3   Coagulation profile No results for input(s): INR, PROTIME in the last 168 hours. ------------------------------------------------------------------------------------------------------------------- No results for input(s): DDIMER in the last 72 hours. -------------------------------------------------------------------------------------------------------------------  Cardiac Enzymes Recent Labs  Lab 08/07/17 1020  TROPONINI <0.03   ------------------------------------------------------------------------------------------------------------------ Invalid input(s): POCBNP  ---------------------------------------------------------------------------------------------------------------  Urinalysis    Component Value Date/Time   COLORURINE YELLOW (A) 08/07/2017 1135   APPEARANCEUR CLEAR (A) 08/07/2017 1135   LABSPEC 1.011 08/07/2017 1135   PHURINE 6.0 08/07/2017 1135   GLUCOSEU NEGATIVE 08/07/2017 1135   HGBUR MODERATE (A) 08/07/2017 1135   BILIRUBINUR NEGATIVE 08/07/2017 1135    KETONESUR NEGATIVE 08/07/2017 1135   PROTEINUR 30 (A) 08/07/2017 1135   NITRITE NEGATIVE 08/07/2017 1135   LEUKOCYTESUR NEGATIVE 08/07/2017 1135     RADIOLOGY: Dg Chest 2 View  Result Date: 08/07/2017 CLINICAL DATA:  Left chest pain EXAM: CHEST  2 VIEW COMPARISON:  None FINDINGS: Indistinct asymmetric opacities at the left lung base best appreciated on the frontal projection. The lungs appear otherwise clear. Cardiac and mediastinal margins appear normal. No pleural effusion. IMPRESSION: 1. Indistinct streaky opacities at the left lung base. The appearance is nonspecific but early bronchopneumonia is a possibility. Electronically Signed   By: Van Clines M.D.   On: 08/07/2017 11:12    EKG: Orders placed or performed during the hospital encounter of 08/07/17  . ED EKG within 10 minutes  . ED EKG within 10 minutes    IMPRESSION AND PLAN: 1 acute malignant hypertension With associated atypical chest pain, chronic kidney disease-most likely representing hypertensive nephrosclerosis, and chronic systolic congestive heart failure Most likely secondary to chronic noncompliance of medical management Admit to regular nursing for bed, Coreg twice daily given history of systolic congestive heart failure, Norvasc daily, IV hydralazine as needed systolic blood pressure greater than 160, low-sodium diet, vitals per routine, make changes as per necessary  2 acute atypical CP w / new left BBB ?  Secondary to above Aspirin daily, supplemental oxygen as needed, nitrates as needed, morphine IV as needed breakthrough pain, Coreg, avoid ACE/ARB therapy given renal insufficiency, check echocardiogram, rule out acute coronary syndrome with cardiac enzymes x3 sets, consult cardiology for expert opinion, and all other plans as stated above  3 acute hyperkalemia Did receive IV sodium bicarb, Kayexalate, IV insulin, calcium gluconate in the emergency room Recheck potassium level at 1500 hrs.  4 probable  chronic hypertensive nephrosclerosis Nephrology consult with expert opinion, strict I&O monitoring, daily weights  5 history of HIV, per [facility] records Patient disputes this Check HIV screen, CD4 count, and consider infectious disease consultation if confirmed  6 acute abnormal  chest x-ray ?  Early left pneumonia though patient is without infectious symptomatology Check CT chest for further evaluation  7 chronic tobacco smoking abuse/dependency Nicotine patch daily with cessation counseling  8 chronic systolic congestive heart failure without exacerbation Ejection fraction 45% based on [facility] records Will repeat echocardiogram, aspirin daily  9 history of hepatitis C Stable  10 history of splenomegaly Stable   All the records are reviewed and case discussed with ED provider. Management plans discussed with the patient, family and they are in agreement.  CODE STATUS:full Code Status History    This patient does not have a recorded code status. Please follow your organizational policy for patients in this situation.       TOTAL TIME TAKING CARE OF THIS PATIENT: 45 minutes of critical care time was used to interview patient and her significant other, discuss plan of care with subspecialty staff/ancillary staff, review outside medical records as well as medical chart, all questions answered.    Avel Peace Isabeau Mccalla M.D on 08/07/2017   Between 7am to 6pm - Pager - [phone]  After 6pm go to [email or web address] - password [email or web address] [facility]  Office  [phone]  CC: Primary care physician; No primary care provider on file.   Note: This dictation was prepared with Dragon dictation along with smaller phrase technology. Any transcriptional errors that result from this process are unintentional.

## 2017-08-07 NOTE — ED Provider Notes (Signed)
[facility] Department Provider Note   ____________________________________________   First MD Initiated Contact with Patient 08/07/17 1124     (approximate)  I have reviewed the triage vital signs and the nursing notes.   HISTORY  Chief Complaint chest discomfort    HPI Rebecca Bridges is a 42 y.o. female a history of cardia myopathy, hepatitis, renal insufficiency, severe hypertension  Patient presents today for evaluation of slight discomfort in her left mid chest.  She reports is very hard to understand the feeling and describe it.  Does not radiate.  Not associated any shortness of breath.  Spin persistent for about 4-5 days.  She reports it feels like a "gas bubble" underneath her left breast.  It is not notably painful, she reported seems to have some relief with taking things like antacids.  Also discussed the patient, she reports she had follow-up with [facility], they have noted that she has had slightly elevated potassium in the past.  Also has been working to try to get her blood pressure improved.  She recently ran out of her lisinopril.  Denies headache.  No nausea or vomiting.  No fevers or chills.  Denies pregnancy.  Currently on her menstrual cycle.  Reports no abdominal pain, or other feels like a "bubble" feeling just below her left breast.  Past Medical History:  Diagnosis Date  . Hypertension     Patient Active Problem List   Diagnosis Date Noted  . HTN (hypertension), malignant 08/07/2017    Past Surgical History:  Procedure Laterality Date  . CHOLECYSTECTOMY      Prior to Admission medications   Medication Sig Start Date End Date Taking? Authorizing Provider  amLODipine (NORVASC) 5 MG tablet Take 5 mg by mouth daily.   Yes [provider]  dapsone 100 MG tablet Take 100 mg by mouth daily.   Yes [provider]  dolutegravir (TIVICAY) 50 MG tablet Take 50 mg by mouth daily.   Yes [provider]  emtricitabine-tenofovir AF (DESCOVY) 200-25 MG tablet Take 1 tablet by mouth daily.   Yes [provider]  lisinopril (PRINIVIL,ZESTRIL) 40 MG tablet Take 40 mg by mouth daily.   Yes [provider]    Allergies Sulfa antibiotics  No family history on file.  Social History Social History   Tobacco Use  . Smoking status: Current Some Day Smoker  . Smokeless tobacco: Never Used  Substance Use Topics  . Alcohol use: No    Frequency: Never  . Drug use: No    Review of Systems Constitutional: No fever/chills Eyes: No visual changes. ENT: No sore throat. Cardiovascular: See HPI Respiratory: Denies shortness of breath. Gastrointestinal: No abdominal pain.  No nausea, no vomiting.  No diarrhea.  No constipation. Genitourinary: Negative for dysuria. Musculoskeletal: Negative for back pain. Skin: Negative for rash. Neurological: Negative for headaches, focal weakness or numbness.    ____________________________________________   PHYSICAL EXAM:  VITAL SIGNS: ED Triage Vitals  Enc Vitals Group     BP 08/07/17 1021 (!) 180/123     Pulse Rate 08/07/17 1021 85     Resp 08/07/17 1021 18     Temp 08/07/17 1021 98.6 F (37 C)     Temp Source 08/07/17 1021 Oral     SpO2 08/07/17 1021 100 %     Weight 08/07/17 1005 133 lb (60.3 kg)     Height --      Head Circumference --  Peak Flow --      Pain Score 08/07/17 1005 7     Pain Loc --      Pain Edu? --      Excl. in [place]? --     Constitutional: Alert and oriented. Well appearing and in no acute distress. Eyes: Conjunctivae are normal. Head: Atraumatic. Nose: No congestion/rhinnorhea. Mouth/Throat: Mucous membranes are moist. Neck: No stridor.   Cardiovascular: Normal rate, regular rhythm. Grossly normal heart sounds.  Good peripheral circulation.  Keloid noted across the anterior chest. Respiratory: Normal respiratory effort.  No retractions. Lungs CTAB. Gastrointestinal: Soft and  nontender. No distention. Musculoskeletal: No lower extremity tenderness nor edema. Neurologic:  Normal speech and language. No gross focal neurologic deficits are appreciated.  Skin:  Skin is warm, dry and intact. No rash noted. Psychiatric: Mood and affect are normal. Speech and behavior are normal.  ____________________________________________   LABS (all labs ordered are listed, but only abnormal results are displayed)  Labs Reviewed  BASIC METABOLIC PANEL - Abnormal; Notable for the following components:      Result Value   Sodium 134 (*)    Potassium 5.9 (*)    CO2 21 (*)    BUN 32 (*)    Creatinine, Ser 1.90 (*)    Calcium 8.6 (*)    GFR calc non Af Amer 32 (*)    GFR calc Af Amer 37 (*)    All other components within normal limits  CBC - Abnormal; Notable for the following components:   MCV 101.3 (*)    MCH 34.3 (*)    Platelets 126 (*)    All other components within normal limits  HEPATIC FUNCTION PANEL - Abnormal; Notable for the following components:   Total Protein 8.8 (*)    Bilirubin, Direct <0.1 (*)    All other components within normal limits  URINALYSIS, COMPLETE (UACMP) WITH MICROSCOPIC - Abnormal; Notable for the following components:   Color, Urine YELLOW (*)    APPearance CLEAR (*)    Hgb urine dipstick MODERATE (*)    Protein, ur 30 (*)    Squamous Epithelial / LPF 0-5 (*)    All other components within normal limits  TROPONIN I  LIPASE, BLOOD  HELPER T-LYMPH-CD4 ([facility] ONLY)  POTASSIUM  LIPASE, BLOOD  POCT PREGNANCY, URINE  POC URINE PREG, ED  CBG MONITORING, ED   ____________________________________________  EKG  EKG reviewed interrupt me at 10:20 AM Heart rate 90 QRS 120 500 Normal sinus rhythm, notable peaking of the T waves in V3 through V5.  Left bundle branch appearance.  No scar Bosa criteria are noted.  Concerning for the possibility of hyperacute T wave versus hyperkalemia.  No old is available for  comparison. ____________________________________________  RADIOLOGY    Chest x-ray reviewed, streaky opacity left lung base.  In review, the patient denies any systemic symptoms, denies fever, normal white count, no productive cough.  I do not believe this clinically represents infectious etiology, though I cannot be certain it seems unlikely.  She does also report she had previous fluid removed from his lung, this could be scarring in nature I do not know for certain.  Discussed with the hospitalist, made aware of x-ray findings. ____________________________________________   PROCEDURES  Procedure(s) performed: None  Procedures  Critical Care performed: Yes, see critical care note(s)  CRITICAL CARE Performed by: Delman Kitten   Total critical care time: 35 minutes  Critical care time was exclusive of separately billable procedures and treating other  patients.  Critical care was necessary to treat or prevent imminent or life-threatening deterioration.  Critical care was time spent personally by me on the following activities: development of treatment plan with patient and/or surrogate as well as nursing, discussions with consultants, evaluation of patient's response to treatment, examination of patient, obtaining history from patient or surrogate, ordering and performing treatments and interventions, ordering and review of laboratory studies, ordering and review of radiographic studies, pulse oximetry and re-evaluation of patient's condition.  ____________________________________________   INITIAL IMPRESSION / ASSESSMENT AND PLAN / ED COURSE  Pertinent labs & imaging results that were available during my care of the patient were reviewed by me and considered in my medical decision making (see chart for details).  Patient presents for evaluation of left-sided chest discomfort is very hard to describe is been ongoing for 4-5 days.  Review of her symptoms a seem very atypical of ACS,  her troponin is normal.  EKG demonstrates a peak T waves appearance and her potassium is notably elevated at 5.9.  I reviewed [facility] records, and this appears to be a acute hyperkalemia.  I am not certain of why chest discomfort, may not be related, given her very reassuring clinical examination I would be concerned about both her chest pressure and also the hyperkalemia which may or may not be related.  Discussed her case and care with Dr. Candiss Norse of nephrology, advises treatment of hyperkalemia, admission and starting hydralazine for blood pressure management at this time, with holding her other antihypertensives.  Clinical Course as of Aug 08 1243  Sat Aug 07, 2017  1020 Reviewed triage EKG.  Normal sinus rhythm with a left bundle branch block, no old for comparison, however no evidence of meeting [place] for STEMI or obvious ischemia noted.  [MQ]  1124 Potassium: (!) 5.9 [MQ]  1137 Creatinine: (!) 1.90 [MQ]    Clinical Course User Index [MQ] Delman Kitten, MD   ----------------------------------------- 12:50 PM on 08/07/2017 -----------------------------------------  Patient admitted to the hospital.  Repeat glucose ordered at this time.  Alert oriented, in no distress.   ----------------------------------------- 2:22 PM on 08/07/2017 -----------------------------------------  Patient given juice, slight asymptomatic hypoglycemia did occur earlier after insulin and dextrose.  Patient blood pressure improved 180/24.  Patient be admitted to hospital for further workup.  Currently resting comfortably in no distress.  ____________________________________________   FINAL CLINICAL IMPRESSION(S) / ED DIAGNOSES  Final diagnoses:  Hyperkalemia  Electrocardiogram showing T wave abnormalities  Chest discomfort      NEW MEDICATIONS STARTED DURING THIS VISIT:  New Prescriptions   No medications on file     Note:  This document was prepared using Dragon voice recognition software and may  include unintentional dictation errors.     Delman Kitten, MD 08/07/17 [phone]

## 2017-08-07 NOTE — ED Notes (Signed)
Pt had hamburger and fries in room - advised to eat at this time - pt also drinking sprite and orange juice given

## 2017-08-07 NOTE — ED Notes (Signed)
Pt reports chest pain for 3-4 days that has increasingly gotten worse with no radiation - no relief from gas-x - denies N/V - denies dizziness or headaches

## 2017-08-07 NOTE — ED Triage Notes (Signed)
Chest discomfort for four days.

## 2017-08-07 NOTE — ED Notes (Signed)
Floor need BP more controlled to take report - releasing hydralazine IV order at this time

## 2017-08-08 ENCOUNTER — Inpatient Hospital Stay (HOSPITAL_COMMUNITY)
Admit: 2017-08-08 | Discharge: 2017-08-08 | Disposition: A | Discharge status: Home / Self Care | Payer: Self-pay | Attending: Family Medicine | Admitting: Family Medicine

## 2017-08-08 DIAGNOSIS — I517 Cardiomegaly: Secondary | ICD-10-CM

## 2017-08-08 LAB — BASIC METABOLIC PANEL
ANION GAP: 7 (ref 5–15)
BUN: 23 mg/dL — ABNORMAL HIGH (ref 6–20)
CALCIUM: 8.4 mg/dL — AB (ref 8.9–10.3)
CO2: 21 mmol/L — AB (ref 22–32)
CREATININE: 1.48 mg/dL — AB (ref 0.44–1.00)
Chloride: 110 mmol/L (ref 101–111)
GFR calc Af Amer: 50 mL/min — ABNORMAL LOW (ref >60)
GFR calc non Af Amer: 43 mL/min — ABNORMAL LOW (ref >60)
GLUCOSE: 89 mg/dL (ref 65–99)
Potassium: 4.2 mmol/L (ref 3.5–5.1)
Sodium: 138 mmol/L (ref 135–145)

## 2017-08-08 LAB — ECHOCARDIOGRAM COMPLETE
Area-P 1/2: 5.5 cm2
CHL CUP MV DEC (S): 137
E decel time: 137 msec
E/e' ratio: 23.23
FS: 22 % — AB (ref 28–44)
HEIGHTINCHES: 63 in
IV/PV OW: 0.83
LA diam end sys: 34 mm
LA vol A4C: 21.6 ml
LA vol: 33.1 mL
LADIAMINDEX: 2.01 cm/m2
LASIZE: 34 mm
LAVOLIN: 19.6 mL/m2
LV E/e' medial: 23.23
LV e' LATERAL: 4.03 cm/s
LVEEAVG: 23.23
Lateral S' vel: 15.6 cm/s
MV Peak grad: 4 mmHg
MV pk E vel: 93.6 m/s
MVPKAVEL: 108 m/s
MVSPHT: 40 ms
PW: 14.5 mm — AB (ref 0.6–1.1)
TAPSE: 24.8 mm
TDI e' lateral: 4.03
TDI e' medial: 4.57
WEIGHTICAEL: 2241.6 [oz_av]

## 2017-08-08 LAB — TROPONIN I

## 2017-08-08 LAB — LIPID PANEL
Cholesterol: 126 mg/dL (ref 0–200)
HDL: 40 mg/dL — AB (ref >40)
LDL Cholesterol: 61 mg/dL (ref 0–99)
Total CHOL/HDL Ratio: 3.2 RATIO
Triglycerides: 126 mg/dL (ref <150)
VLDL: 25 mg/dL (ref 0–40)

## 2017-08-08 MED ORDER — FLUTICASONE PROPIONATE 50 MCG/ACT NA SUSP
1.0000 | Freq: Every day | NASAL | Status: DC
  Administered 2017-08-09: 1 via NASAL
  Filled 2017-08-08: qty 16

## 2017-08-08 MED ORDER — LORATADINE 10 MG PO TABS
10.0000 mg | ORAL_TABLET | Freq: Every day | ORAL | Status: DC
  Administered 2017-08-08 – 2017-08-09 (×2): 10 mg via ORAL
  Filled 2017-08-08 (×2): qty 1

## 2017-08-08 MED ORDER — LISINOPRIL 20 MG PO TABS: 40.0000 mg | ORAL_TABLET | Freq: Every day | ORAL | Status: DC

## 2017-08-08 MED ORDER — LISINOPRIL 20 MG PO TABS
40.0000 mg | ORAL_TABLET | Freq: Every day | ORAL | Status: DC
  Administered 2017-08-08 – 2017-08-09 (×2): 40 mg via ORAL
  Filled 2017-08-08 (×2): qty 2

## 2017-08-08 MED ORDER — CARVEDILOL 25 MG PO TABS
25.0000 mg | ORAL_TABLET | Freq: Two times a day (BID) | ORAL | Status: DC
  Administered 2017-08-08 – 2017-08-09 (×2): 25 mg via ORAL
  Filled 2017-08-08 (×2): qty 1

## 2017-08-08 MED ORDER — ISOSORBIDE MONONITRATE ER 30 MG PO TB24
30.0000 mg | ORAL_TABLET | Freq: Every day | ORAL | Status: DC
  Administered 2017-08-08: 30 mg via ORAL
  Filled 2017-08-08: qty 1

## 2017-08-08 NOTE — Consult Note (Signed)
Date: 08/08/2017                  Patient Name:  Rebecca Bridges  MRN: [id number]  DOB: Dec 21, 1975  Age / Sex: 42 y.o., female         PCP: Delton See, RN                 Service Requesting Consult: IM/ Demetrios Loll, MD                 Reason for Consult: HTN, ARF            History of Present Illness: Patient is a 42 y.o. female with medical problems of severe hypertension, who was admitted to [facility] on 08/07/2017 for evaluation of uncontrolled blood pressure and chest pain.  According to her chart, patient also has chronic systolic congestive heart failure, hepatitis C and HIV which patient did not volunteer during her interview Patient states that she has been having worsening chest pain off and on for the past 3-4 days. In the ER evaluation showed a severely elevated blood pressure up to 223/122.  Patient was admitted for further evaluation and management.  Patient did admit that she ran out of her lisinopril.  She states she was taking amlodipine and carvedilol.  Medications: Outpatient medications: Medications Prior to Admission  Medication Sig Dispense Refill Last Dose  . amLODipine (NORVASC) 5 MG tablet Take 5 mg by mouth daily.   Past Week at Unknown time  . dapsone 100 MG tablet Take 100 mg by mouth daily.   Past Week at Unknown time  . dolutegravir (TIVICAY) 50 MG tablet Take 50 mg by mouth daily.   Past Week at Unknown time  . emtricitabine-tenofovir AF (DESCOVY) 200-25 MG tablet Take 1 tablet by mouth daily.   Past Week at Unknown time  . lisinopril (PRINIVIL,ZESTRIL) 40 MG tablet Take 40 mg by mouth daily.   Past Week at Unknown time    Current medications: Current Facility-Administered Medications  Medication Dose Route Frequency Provider Last Rate Last Dose  . 0.9 %  sodium chloride infusion  250 mL Intravenous PRN Salary, Montell D, MD      . acetaminophen (TYLENOL) tablet 650 mg  650 mg Oral Q6H PRN Salary, Montell D, MD       Or  . acetaminophen (TYLENOL)  suppository 650 mg  650 mg Rectal Q6H PRN Salary, Montell D, MD      . amLODipine (NORVASC) tablet 10 mg  10 mg Oral Daily Salary, Montell D, MD   10 mg at 08/08/17 0906  . aspirin EC tablet 325 mg  325 mg Oral Daily Salary, Holly Bodily D, MD   325 mg at 08/08/17 0906  . carvedilol (COREG) tablet 25 mg  25 mg Oral BID WC Demetrios Loll, MD      . docusate sodium (COLACE) capsule 100 mg  100 mg Oral BID Salary, Montell D, MD      . heparin injection 5,000 Units  5,000 Units Subcutaneous Q8H Salary, Montell D, MD   5,000 Units at 08/08/17 0531  . hydrALAZINE (APRESOLINE) injection 20 mg  20 mg Intravenous Q4H PRN Salary, Holly Bodily D, MD   20 mg at 08/08/17 0532  . HYDROcodone-acetaminophen (NORCO/VICODIN) 5-325 MG per tablet 1-2 tablet  1-2 tablet Oral Q4H PRN Gorden Harms, MD   2 tablet at 08/08/17 0907  . lisinopril (PRINIVIL,ZESTRIL) tablet 40 mg  40 mg Oral Daily Demetrios Loll, MD      .  loratadine (CLARITIN) tablet 10 mg  10 mg Oral Daily Salary, Montell D, MD   10 mg at 08/08/17 0906  . morphine 2 MG/ML injection 2 mg  2 mg Intravenous Q2H PRN Salary, Montell D, MD      . nicotine (NICODERM CQ - dosed in mg/24 hours) patch 14 mg  14 mg Transdermal Daily Salary, Montell D, MD   14 mg at 08/08/17 0907  . nitroGLYCERIN (NITROSTAT) SL tablet 0.4 mg  0.4 mg Sublingual Q5 min PRN Salary, Montell D, MD      . ondansetron (ZOFRAN) tablet 4 mg  4 mg Oral Q6H PRN Salary, Montell D, MD       Or  . ondansetron (ZOFRAN) injection 4 mg  4 mg Intravenous Q6H PRN Salary, Montell D, MD      . sodium chloride flush (NS) 0.9 % injection 3 mL  3 mL Intravenous Q12H Salary, Montell D, MD   3 mL at 08/08/17 0908  . sodium chloride flush (NS) 0.9 % injection 3 mL  3 mL Intravenous PRN Salary, Holly Bodily D, MD   3 mL at 08/08/17 0532      Allergies: Allergies  Allergen Reactions  . Sulfa Antibiotics       Past Medical History: Past Medical History:  Diagnosis Date  . Hypertension      Past Surgical  History: Past Surgical History:  Procedure Laterality Date  . CHOLECYSTECTOMY       Family History: No family history on file.   Social History: Social History   Socioeconomic History  . Marital status: Single    Spouse name: Not on file  . Number of children: Not on file  . Years of education: Not on file  . Highest education level: Not on file  Social Needs  . Financial resource strain: Not on file  . Food insecurity - worry: Not on file  . Food insecurity - inability: Not on file  . Transportation needs - medical: Not on file  . Transportation needs - non-medical: Not on file  Occupational History  . Not on file  Tobacco Use  . Smoking status: Current Some Day Smoker  . Smokeless tobacco: Never Used  Substance and Sexual Activity  . Alcohol use: No    Frequency: Never  . Drug use: No  . Sexual activity: Not on file  Other Topics Concern  . Not on file  Social History Narrative  . Not on file     Review of Systems: Gen: Denies fevers or chills HEENT: Denies vision or hearing problems CV: Chest pain upon presentation but resolved at present Resp: No shortness of breath or cough or sputum production GI: No nausea or vomiting GU : No blood in the urine, never had kidney stone MS: Denies acute complaints Derm:  No problems reported Psych: No complaints Heme: No complaints Neuro: No complaints Endocrine.  No complaints  Vital Signs: Blood pressure (!) 175/108, pulse 96, temperature 98 F (36.7 C), temperature source Oral, resp. rate 18, height 5\' 3"  (1.6 m), weight 63.5 kg (140 lb 1.6 oz), last menstrual period 08/06/2017, SpO2 100 %.   Intake/Output Summary (Last 24 hours) at 08/08/2017 1000 Last data filed at 08/08/2017 1062 Gross per 24 hour  Intake 580 ml  Output -  Net 580 ml    Weight trends: Filed Weights   08/07/17 1005 08/07/17 1529 08/08/17 0449  Weight: 60.3 kg (133 lb) 64.7 kg (142 lb 11.2 oz) 63.5 kg (140 lb 1.6 oz)  Physical  Exam: General:  Well-appearing, sitting up in the bed  HEENT  anicteric, moist oral mucous membranes  Neck:  Supple  Lungs:  Clear to auscultation bilaterally  Heart::  Regular rhythm, no rub or gallop  Abdomen:  Soft, nontender  Extremities:  No peripheral edema  Neurologic:  Alert, oriented  Skin:  No acute rashes             Lab results: Basic Metabolic Panel: Recent Labs  Lab 08/07/17 1020 08/07/17 1530 08/08/17 0423  NA 134*  --  138  K 5.9* 4.3 4.2  CL 108  --  110  CO2 21*  --  21*  GLUCOSE 89  --  89  BUN 32*  --  23*  CREATININE 1.90* 1.53* 1.48*  CALCIUM 8.6*  --  8.4*    Liver Function Tests: Recent Labs  Lab 08/07/17 1020  AST 35  ALT 24  ALKPHOS 57  BILITOT 0.6  PROT 8.8*  ALBUMIN 3.7   Recent Labs  Lab 08/07/17 1530  LIPASE 57*   No results for input(s): AMMONIA in the last 168 hours.  CBC: Recent Labs  Lab 08/07/17 1020 08/07/17 1530  WBC 4.9 6.6  HGB 13.4 13.3  HCT 39.7 39.7  MCV 101.3* 100.4*  PLT 126* 112*    Cardiac Enzymes: Recent Labs  Lab 08/08/17 0423  TROPONINI <0.03    BNP: Invalid input(s): POCBNP  CBG: Recent Labs  Lab 08/07/17 1302 08/07/17 1724  GLUCAP 58* 99    Microbiology: No results found for this or any previous visit (from the past 720 hour(s)).   Coagulation Studies: No results for input(s): LABPROT, INR in the last 72 hours.  Urinalysis: Recent Labs    08/07/17 1135  COLORURINE YELLOW*  LABSPEC 1.011  PHURINE 6.0  GLUCOSEU NEGATIVE  HGBUR MODERATE*  BILIRUBINUR NEGATIVE  KETONESUR NEGATIVE  PROTEINUR 30*  NITRITE NEGATIVE  LEUKOCYTESUR NEGATIVE        Imaging: Dg Chest 2 View  Result Date: 08/07/2017 CLINICAL DATA:  Left chest pain EXAM: CHEST  2 VIEW COMPARISON:  None FINDINGS: Indistinct asymmetric opacities at the left lung base best appreciated on the frontal projection. The lungs appear otherwise clear. Cardiac and mediastinal margins appear normal. No pleural  effusion. IMPRESSION: 1. Indistinct streaky opacities at the left lung base. The appearance is nonspecific but early bronchopneumonia is a possibility. Electronically Signed   By: Van Clines M.D.   On: 08/07/2017 11:12   Ct Chest Wo Contrast  Result Date: 08/07/2017 CLINICAL DATA:  42 year old female with LEFT chest pain for 4 days. EXAM: CT CHEST WITHOUT CONTRAST TECHNIQUE: Multidetector CT imaging of the chest was performed following the standard protocol without IV contrast. COMPARISON:  08/07/2017 chest radiograph FINDINGS: Cardiovascular: Heart size normal. No evidence of thoracic aortic aneurysm or pericardial effusion. Mediastinum/Nodes: No enlarged mediastinal or axillary lymph nodes. Thyroid gland, trachea, and esophagus demonstrate no significant findings. Lungs/Pleura: Lungs are clear. No pleural effusion or pneumothorax. Upper Abdomen: No acute abnormality. Musculoskeletal: No chest wall mass or suspicious bone lesions identified. IMPRESSION: Unremarkable noncontrast chest CT.  No evidence of pneumonia. Electronically Signed   By: Margarette Canada M.D.   On: 08/07/2017 18:49      Assessment & Plan: Pt is a 42 y.o. African-American  female with severe hypertension, admitted for evaluation of chest pain and accelerated hypertension.  Review of records from care everywhere show patient has CHF, HIV, hepatitis C.  Patient did not volunteer this information  during interview  1.  Acute kidney injury 2.  Chronic kidney disease stage III, baseline creatinine 1.3/GFR 55 from October 2018 3.  Severe hypertension  Severe hypertension is likely secondary to patient not taking her medications as prescribed.  She states she ran out of lisinopril.  Blood pressure is now much better controlled with restarting her home medications.  Serum creatinine is also improving. Obtain renal artery duplex Low-salt diet Patient will follow up with her PMD at [facility].    LOS: [place] 2/24/201910:00  AM  [place], [place]  Note: This note was prepared with Dragon dictation. Any transcription errors are unintentional

## 2017-08-08 NOTE — Progress Notes (Addendum)
[place] at [place] NAME: Rebecca Bridges    MR#:  [id number]  DATE OF BIRTH:  May 06, 1976  SUBJECTIVE:  CHIEF COMPLAINT:   Chief Complaint  Patient presents with  . chest discomfort   The patient has no complaints.  Blood pressure was still high this morning. REVIEW OF SYSTEMS:  Review of Systems  Constitutional: Negative for chills, fever and malaise/fatigue.  HENT: Negative for sore throat.   Eyes: Negative for blurred vision and double vision.  Respiratory: Negative for cough, hemoptysis, shortness of breath, wheezing and stridor.   Cardiovascular: Negative for chest pain, palpitations, orthopnea and leg swelling.  Gastrointestinal: Negative for abdominal pain, blood in stool, diarrhea, melena, nausea and vomiting.  Genitourinary: Negative for dysuria, flank pain and hematuria.  Musculoskeletal: Negative for back pain and joint pain.  Neurological: Negative for dizziness, sensory change, focal weakness, seizures, loss of consciousness, weakness and headaches.  Endo/Heme/Allergies: Negative for polydipsia.  Psychiatric/Behavioral: Negative for depression. The patient is not nervous/anxious.     DRUG ALLERGIES:   Allergies  Allergen Reactions  . Sulfa Antibiotics    VITALS:  Blood pressure 120/79, pulse (!) 101, temperature 98 F (36.7 C), temperature source Oral, resp. rate 18, height 5\' 3"  (1.6 m), weight 140 lb 1.6 oz (63.5 kg), last menstrual period 08/06/2017, SpO2 99 %. PHYSICAL EXAMINATION:  Physical Exam  Constitutional: She is oriented to person, place, and time and well-developed, well-nourished, and in no distress.  HENT:  Head: Normocephalic.  Mouth/Throat: Oropharynx is clear and moist.  Eyes: Conjunctivae and EOM are normal. Pupils are equal, round, and reactive to light. No scleral icterus.  Neck: Normal range of motion. Neck supple. No JVD present. No tracheal deviation present.  Cardiovascular: Normal rate,  regular rhythm and normal heart sounds. Exam reveals no gallop.  No murmur heard. Pulmonary/Chest: Effort normal and breath sounds normal. No respiratory distress. She has no wheezes. She has no rales.  Abdominal: Soft. Bowel sounds are normal. She exhibits no distension. There is no tenderness. There is no rebound.  Musculoskeletal: Normal range of motion. She exhibits no edema or tenderness.  Neurological: She is alert and oriented to person, place, and time. No cranial nerve deficit.  Skin: No rash noted. No erythema.  Psychiatric: Affect normal.   LABORATORY PANEL:  Female CBC Recent Labs  Lab 08/07/17 1530  WBC 6.6  HGB 13.3  HCT 39.7  PLT 112*   ------------------------------------------------------------------------------------------------------------------ Chemistries  Recent Labs  Lab 08/07/17 1020  08/08/17 0423  NA 134*  --  138  K 5.9*   < > 4.2  CL 108  --  110  CO2 21*  --  21*  GLUCOSE 89  --  89  BUN 32*  --  23*  CREATININE 1.90*   < > 1.48*  CALCIUM 8.6*  --  8.4*  AST 35  --   --   ALT 24  --   --   ALKPHOS 57  --   --   BILITOT 0.6  --   --    < > = values in this interval not displayed.   RADIOLOGY:  Ct Chest Wo Contrast  Result Date: 08/07/2017 CLINICAL DATA:  42 year old female with LEFT chest pain for 4 days. EXAM: CT CHEST WITHOUT CONTRAST TECHNIQUE: Multidetector CT imaging of the chest was performed following the standard protocol without IV contrast. COMPARISON:  08/07/2017 chest radiograph FINDINGS: Cardiovascular: Heart size normal. No evidence of thoracic aortic  aneurysm or pericardial effusion. Mediastinum/Nodes: No enlarged mediastinal or axillary lymph nodes. Thyroid gland, trachea, and esophagus demonstrate no significant findings. Lungs/Pleura: Lungs are clear. No pleural effusion or pneumothorax. Upper Abdomen: No acute abnormality. Musculoskeletal: No chest wall mass or suspicious bone lesions identified. IMPRESSION: Unremarkable  noncontrast chest CT.  No evidence of pneumonia. Electronically Signed   By: Margarette Canada M.D.   On: 08/07/2017 18:49   ASSESSMENT AND PLAN:   1 acute malignant hypertension With associated atypical chest pain Most likely secondary to noncompliance of medical management She said that she ran out of hypertension medication. Increased Coreg to 25 mg twice daily, resumed lisinopril, continue Norvasc daily, IV hydralazine as needed. Patient will follow up with her PMD at [facility].  2 acute atypical CP w / new left BBB ?  Secondary to above Aspirin daily, nitrates as needed, morphine IV as needed  Echocardiogram: EF 40-45%, uled out acute coronary syndrome. No further cardiac workup at this time and may need functional workup as outpatient per Dr. Clayborn Bigness.  3 Acute hyperkalemia Improved with IV sodium bicarb, Kayexalate, IV insulin, calcium gluconate in the emergency room  4 ARF on CKD, improving. Follow-up renal arterial duplex and BMP,  resume lisinopril per Dr. Candiss Norse.  5 history of HIV, per [facility] records Patient disputes this Check HIV screen, CD4 count, and consider infectious disease consultation if confirmed  6 acute abnormal chest x-ray  CT chest is unremarkable.  7  tobacco abuse.  Smoking cessation was counseled for 4 minutes, nicotine patch.  8 chronic systolic congestive heart failure without exacerbation Ejection fraction 45% based on [facility] records The patient was on Lasix and spironolactone per [facility] records. The patient has been taking the medications.  9 history of hepatitis C Stable  10 history of splenomegaly Stable Discussed with Dr. Candiss Norse. All the records are reviewed and case discussed with Care Management/Social Worker. Management plans discussed with the patient, family and they are in agreement.  CODE STATUS: Full Code  TOTAL TIME TAKING CARE OF THIS PATIENT: 32 minutes.   More than 50% of the time was spent in  counseling/coordination of care: YES  POSSIBLE D/C IN 1 DAYS, DEPENDING ON CLINICAL CONDITION.   Demetrios Loll M.D on 08/08/2017 at 3:50 PM  Between 7am to 6pm - Pager - [phone]  After 6pm go to [email or web address] - [facility]

## 2017-08-08 NOTE — Plan of Care (Signed)
Blood pressure trending down.  Will continue to monitor.

## 2017-08-09 ENCOUNTER — Inpatient Hospital Stay: Payer: Self-pay

## 2017-08-09 LAB — BASIC METABOLIC PANEL
ANION GAP: 8 (ref 5–15)
BUN: 20 mg/dL (ref 6–20)
CALCIUM: 8.5 mg/dL — AB (ref 8.9–10.3)
CHLORIDE: 107 mmol/L (ref 101–111)
CO2: 19 mmol/L — ABNORMAL LOW (ref 22–32)
CREATININE: 1.74 mg/dL — AB (ref 0.44–1.00)
GFR calc non Af Amer: 36 mL/min — ABNORMAL LOW (ref >60)
GFR, EST AFRICAN AMERICAN: 41 mL/min — AB (ref >60)
Glucose, Bld: 90 mg/dL (ref 65–99)
Potassium: 4.2 mmol/L (ref 3.5–5.1)
SODIUM: 134 mmol/L — AB (ref 135–145)

## 2017-08-09 LAB — HELPER T-LYMPH-CD4 (ARMC ONLY)
% CD 4 Pos. Lymph.: 19.8 % — ABNORMAL LOW (ref 30.8–58.5)
Absolute CD 4 Helper: 158 /uL — ABNORMAL LOW (ref 359–1519)
BASOS: 0 %
Basophils Absolute: 0 10*3/uL (ref 0.0–0.2)
EOS (ABSOLUTE): 0.1 10*3/uL (ref 0.0–0.4)
Eos: 1 %
HEMATOCRIT: 39.2 % (ref 34.0–46.6)
Hemoglobin: 13 g/dL (ref 11.1–15.9)
Immature Grans (Abs): 0 10*3/uL (ref 0.0–0.1)
Immature Granulocytes: 0 %
Lymphocytes Absolute: 0.8 10*3/uL (ref 0.7–3.1)
Lymphs: 11 %
MCH: 33.1 pg — AB (ref 26.6–33.0)
MCHC: 33.2 g/dL (ref 31.5–35.7)
MCV: 100 fL — AB (ref 79–97)
MONOS ABS: 0.4 10*3/uL (ref 0.1–0.9)
Monocytes: 6 %
NEUTROS ABS: 5.7 10*3/uL (ref 1.4–7.0)
NEUTROS PCT: 82 %
Platelets: 145 10*3/uL — ABNORMAL LOW (ref 150–379)
RBC: 3.93 x10E6/uL (ref 3.77–5.28)
RDW: 14.2 % (ref 12.3–15.4)
WBC: 7 10*3/uL (ref 3.4–10.8)

## 2017-08-09 MED ORDER — HYDRALAZINE HCL 50 MG PO TABS
50.0000 mg | ORAL_TABLET | Freq: Three times a day (TID) | ORAL | Status: DC
  Administered 2017-08-09: 50 mg via ORAL
  Filled 2017-08-09: qty 1

## 2017-08-09 MED ORDER — HYDRALAZINE HCL 50 MG PO TABS: 50.0000 mg | ORAL_TABLET | Freq: Three times a day (TID) | ORAL | 1 refills | Status: DC

## 2017-08-09 MED ORDER — CARVEDILOL 25 MG PO TABS: 25.0000 mg | ORAL_TABLET | Freq: Two times a day (BID) | ORAL | 1 refills | Status: DC

## 2017-08-09 MED ORDER — LISINOPRIL 40 MG PO TABS: 40.0000 mg | ORAL_TABLET | Freq: Every day | ORAL | 0 refills | Status: DC

## 2017-08-09 MED ORDER — HYDRALAZINE HCL 25 MG PO TABS: 25.0000 mg | ORAL_TABLET | Freq: Three times a day (TID) | ORAL | Status: DC

## 2017-08-09 MED ORDER — AMLODIPINE BESYLATE 10 MG PO TABS: 10.0000 mg | ORAL_TABLET | Freq: Every day | ORAL | 1 refills | Status: DC

## 2017-08-09 NOTE — Progress Notes (Signed)
[facility]  ROUNDING NOTE   Subjective:   BP 145/93  Objective:  Vital signs in last 24 hours:  Temp:  [98.7 F (37.1 C)] 98.7 F (37.1 C) (02/25 0335) Pulse Rate:  [77-95] 90 (02/25 1435) Resp:  [16-18] 16 (02/25 1435) BP: (130-171)/(86-111) 145/93 (02/25 1435) SpO2:  [97 %-98 %] 97 % (02/25 0827)  Weight change:  Filed Weights   08/07/17 1005 08/07/17 1529 08/08/17 0449  Weight: 60.3 kg (133 lb) 64.7 kg (142 lb 11.2 oz) 63.5 kg (140 lb 1.6 oz)    Intake/Output: I/O last 3 completed shifts: In: 240 [P.O.:240] Out: 900 [Urine:900]   Intake/Output this shift:  No intake/output data recorded.  Physical Exam: General: NAD,   Head: Normocephalic, atraumatic. Moist oral mucosal membranes  Eyes: Anicteric, PERRL  Neck: Supple, trachea midline  Lungs:  Clear to auscultation  Heart: Regular rate and rhythm  Abdomen:  Soft, nontender,   Extremities: no peripheral edema.  Neurologic: Nonfocal, moving all four extremities  Skin: No lesions        Basic Metabolic Panel: Recent Labs  Lab 08/07/17 1020 08/07/17 1530 08/08/17 0423 08/09/17 0528  NA 134*  --  138 134*  K 5.9* 4.3 4.2 4.2  CL 108  --  110 107  CO2 21*  --  21* 19*  GLUCOSE 89  --  89 90  BUN 32*  --  23* 20  CREATININE 1.90* 1.53* 1.48* 1.74*  CALCIUM 8.6*  --  8.4* 8.5*    Liver Function Tests: Recent Labs  Lab 08/07/17 1020  AST 35  ALT 24  ALKPHOS 57  BILITOT 0.6  PROT 8.8*  ALBUMIN 3.7   Recent Labs  Lab 08/07/17 1020 08/07/17 1530  LIPASE 50 57*   No results for input(s): AMMONIA in the last 168 hours.  CBC: Recent Labs  Lab 08/07/17 1020 08/07/17 1530  WBC 4.9 7.0  6.6  NEUTROABS  --  5.7  HGB 13.4 13.3  13.0  HCT 39.7 39.7  39.2  MCV 101.3* 100.4*  100*  PLT 126* 112*  145*    Cardiac Enzymes: Recent Labs  Lab 08/07/17 1020 08/07/17 1530 08/07/17 2147 08/08/17 0423  TROPONINI <0.03 <0.03 <0.03 <0.03    BNP: Invalid input(s):  POCBNP  CBG: Recent Labs  Lab 08/07/17 1302 08/07/17 1724  GLUCAP 58* 99    Microbiology: No results found for this or any previous visit.  Coagulation Studies: No results for input(s): LABPROT, INR in the last 72 hours.  Urinalysis: Recent Labs    08/07/17 1135  COLORURINE YELLOW*  LABSPEC 1.011  PHURINE 6.0  GLUCOSEU NEGATIVE  HGBUR MODERATE*  BILIRUBINUR NEGATIVE  KETONESUR NEGATIVE  PROTEINUR 30*  NITRITE NEGATIVE  LEUKOCYTESUR NEGATIVE      Imaging: Ct Chest Wo Contrast  Result Date: 08/07/2017 CLINICAL DATA:  42 year old female with LEFT chest pain for 4 days. EXAM: CT CHEST WITHOUT CONTRAST TECHNIQUE: Multidetector CT imaging of the chest was performed following the standard protocol without IV contrast. COMPARISON:  08/07/2017 chest radiograph FINDINGS: Cardiovascular: Heart size normal. No evidence of thoracic aortic aneurysm or pericardial effusion. Mediastinum/Nodes: No enlarged mediastinal or axillary lymph nodes. Thyroid gland, trachea, and esophagus demonstrate no significant findings. Lungs/Pleura: Lungs are clear. No pleural effusion or pneumothorax. Upper Abdomen: No acute abnormality. Musculoskeletal: No chest wall mass or suspicious bone lesions identified. IMPRESSION: Unremarkable noncontrast chest CT.  No evidence of pneumonia. Electronically Signed   By: Margarette Canada M.D.   On:  08/07/2017 18:49   US Renal Artery Duplex Complete  Result Date: 08/09/2017 CLINICAL DATA:  Hypertension and chronic kidney disease. EXAM: RENAL/URINARY TRACT ULTRASOUND RENAL DUPLEX DOPPLER ULTRASOUND COMPARISON:  None. FINDINGS: Right Kidney: Length: 8.9 cm. Mildly increased cortical echogenicity without evidence of significant atrophy, hydronephrosis or focal lesion. Left Kidney: Length: 9.4 cm. Mildly increased cortical echogenicity without evidence of significant atrophy, hydronephrosis or focal lesion. Bladder:  Unremarkable. RENAL DUPLEX ULTRASOUND Right Renal Artery  Velocities: Origin:  167 cm/sec Mid:  89 cm/sec Hilum:  85 cm/sec Interlobar:  29 cm/sec Arcuate:  20 cm/sec Left Renal Artery Velocities: Origin:  88 cm/sec Mid:  145 cm/sec Hilum:  69 cm/sec Interlobar:  34 cm/sec Arcuate:  19 2 cm/sec Aortic Velocity:  114 cm/sec Right Renal-Aortic Ratios: Origin: 1.5 Mid:  0.8 Hilum: 0.7 Interlobar: 0.3 Arcuate: 0.2 Left Renal-Aortic Ratios: Origin: 0.8 Mid: 1.3 Hilum: 0.6 Interlobar: 0.3 Arcuate: 0.2 Renal veins are normally patent bilaterally. No evidence of significant velocity elevation or elevated renal artery ratios to suggest significant renal artery stenosis bilaterally. IMPRESSION: 1. Both kidneys demonstrate increased cortical echogenicity suggestive of underlying chronic kidney disease. No evidence of hydronephrosis. 2. No evidence of renal artery stenosis bilaterally by duplex ultrasound. Electronically Signed   By: Aletta Edouard M.D.   On: 08/09/2017 11:21     Medications:   . sodium chloride     . amLODipine  10 mg Oral Daily  . aspirin EC  325 mg Oral Daily  . carvedilol  25 mg Oral BID WC  . docusate sodium  100 mg Oral BID  . fluticasone  1 spray Each Nare Daily  . heparin  5,000 Units Subcutaneous Q8H  . hydrALAZINE  50 mg Oral Q8H  . lisinopril  40 mg Oral Daily  . loratadine  10 mg Oral Daily  . nicotine  14 mg Transdermal Daily  . sodium chloride flush  3 mL Intravenous Q12H   sodium chloride, acetaminophen **OR** acetaminophen, hydrALAZINE, HYDROcodone-acetaminophen, morphine injection, nitroGLYCERIN, ondansetron **OR** ondansetron (ZOFRAN) IV, sodium chloride flush  Assessment/ Plan:  Ms. Braylie Badami is a 42 y.o. black female with hypertension, congestive heart failure, HIV, hepatitis C admitted for hypertensive urgency.   Restarted on her home blood pressure medications  1. Acute renal failure on chronic kidney disease stage III with proteinuria. Baseline creatinine 1.3, GFR of 55 on 03/2017.  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   2. Hypertension: urgency with chest pain on admissio - Restart home regimen of lisinopril, furosemide, amlodipine and carvedilol.    LOS: 2 Valerian Jewel 2/25/20194:06 PM

## 2017-08-09 NOTE — Progress Notes (Signed)
Recheck BP 165/103/ MD made aware/ orders to monitor and recheck in 1 hour

## 2017-08-09 NOTE — Discharge Instructions (Signed)
Heart healthy diet Compliance to medication Smoking cessation.

## 2017-08-09 NOTE — Discharge Summary (Signed)
[place] at [place] NAME: Rebecca Bridges    MR#:  [id number]  DATE OF BIRTH:  Jan 01, 1976  DATE OF ADMISSION:  08/07/2017   ADMITTING PHYSICIAN: Gorden Harms, MD  DATE OF DISCHARGE: 08/09/2017 PRIMARY CARE PHYSICIAN: Delton See, RN   ADMISSION DIAGNOSIS:  Hyperkalemia [E87.5] Chest discomfort [R07.89] Electrocardiogram showing T wave abnormalities [R94.31] DISCHARGE DIAGNOSIS:  Active Problems:   HTN (hypertension), malignant  SECONDARY DIAGNOSIS:   Past Medical History:  Diagnosis Date  . Hypertension    HOSPITAL COURSE:   1acute malignant hypertension.  Better controlled. With associated atypical chest pain Most likely secondary to noncompliance of medical management She said that she ran out of hypertension medication. Increased Coreg to 25 mg twice daily, resumed lisinopril, continue Norvasc daily, IV hydralazine as needed. Added P.o. hydralazine 50 mg every 8 hours. Patient will follow up with her PMD at [facility].  2acute atypical CP w /new leftBBB ?Secondary to above Aspirin daily, nitrates as needed, morphine IV as needed  Echocardiogram: EF 40-45%, uled out acute coronary syndrome. No further cardiac workup at this time and may need functional workup as outpatient per Dr. Clayborn Bigness.  3Acute hyperkalemia Improved with IV sodium bicarb, Kayexalate, IV insulin, calcium gluconate in the emergency room  4ARF on CKD, stable. Unremarkable renal arterial duplex, resumed lisinopril per Dr. Candiss Norse.  5history of [facility] records Follow-up with [facility] physician.  Continue home HIV medication.  6acute abnormal chest x-ray  CT chest is unremarkable.  7 tobacco abuse.  Smoking cessation was counseled for 4 minutes, nicotine patch.  8chronic systolic congestive heart failure without exacerbation Ejection fraction 45% based on [facility] records The patient was on Lasix and  spironolactone per [facility] records. The patient said she has not taking his medications.  Hold on Lasix and spironolactone due to hyperkalemia and renal function.  9history of hepatitis C Stable  10history of splenomegaly Stable DISCHARGE CONDITIONS:  Stable, discharge to home today. CONSULTS OBTAINED:  Treatment Team:  Yolonda Kida, MD Murlean Iba, MD DRUG ALLERGIES:   Allergies  Allergen Reactions  . Sulfa Antibiotics    DISCHARGE MEDICATIONS:   Allergies as of 08/09/2017      Reactions   Sulfa Antibiotics       Medication List    TAKE these medications   amLODipine 10 MG tablet Commonly known as:  NORVASC Take 1 tablet (10 mg total) by mouth daily. What changed:    medication strength  how much to take   carvedilol 25 MG tablet Commonly known as:  COREG Take 1 tablet (25 mg total) by mouth 2 (two) times daily with a meal.   dapsone 100 MG tablet Take 100 mg by mouth daily.   DESCOVY 200-25 MG tablet Generic drug:  emtricitabine-tenofovir AF Take 1 tablet by mouth daily.   hydrALAZINE 50 MG tablet Commonly known as:  APRESOLINE Take 1 tablet (50 mg total) by mouth every 8 (eight) hours.   lisinopril 40 MG tablet Commonly known as:  PRINIVIL,ZESTRIL Take 1 tablet (40 mg total) by mouth daily.   TIVICAY 50 MG tablet Generic drug:  dolutegravir Take 50 mg by mouth daily.        DISCHARGE INSTRUCTIONS:  See AVS.  If you experience worsening of your admission symptoms, develop shortness of breath, life threatening emergency, suicidal or homicidal thoughts you must seek medical attention immediately by calling 911 or calling your MD immediately  if symptoms less  severe.  You Must read complete instructions/literature along with all the possible adverse reactions/side effects for all the Medicines you take and that have been prescribed to you. Take any new Medicines after you have completely understood and accpet all the possible adverse  reactions/side effects.   Please note  You were cared for by a hospitalist during your hospital stay. If you have any questions about your discharge medications or the care you received while you were in the hospital after you are discharged, you can call the unit and asked to speak with the hospitalist on call if the hospitalist that took care of you is not available. Once you are discharged, your primary care physician will handle any further medical issues. Please note that NO REFILLS for any discharge medications will be authorized once you are discharged, as it is imperative that you return to your primary care physician (or establish a relationship with a primary care physician if you do not have one) for your aftercare needs so that they can reassess your need for medications and monitor your lab values.    On the day of Discharge:  VITAL SIGNS:  Blood pressure (!) 145/93, pulse 90, temperature 98.7 F (37.1 C), temperature source Oral, resp. rate 16, height 5\' 3"  (1.6 m), weight 140 lb 1.6 oz (63.5 kg), last menstrual period 08/06/2017, SpO2 97 %. PHYSICAL EXAMINATION:  GENERAL:  42 y.o.-year-old patient lying in the bed with no acute distress.  EYES: Pupils equal, round, reactive to light and accommodation. No scleral icterus. Extraocular muscles intact.  HEENT: Head atraumatic, normocephalic. Oropharynx and nasopharynx clear.  NECK:  Supple, no jugular venous distention. No thyroid enlargement, no tenderness.  LUNGS: Normal breath sounds bilaterally, no wheezing, rales,rhonchi or crepitation. No use of accessory muscles of respiration.  CARDIOVASCULAR: S1, S2 normal. No murmurs, rubs, or gallops.  ABDOMEN: Soft, non-tender, non-distended. Bowel sounds present. No organomegaly or mass.  EXTREMITIES: No pedal edema, cyanosis, or clubbing.  NEUROLOGIC: Cranial nerves II through XII are intact. Muscle strength 5/5 in all extremities. Sensation intact. Gait not checked.  PSYCHIATRIC: The  patient is alert and oriented x 3.  SKIN: No obvious rash, lesion, or ulcer.  DATA REVIEW:   CBC Recent Labs  Lab 08/07/17 1530  WBC 6.6  HGB 13.3  HCT 39.7  PLT 112*    Chemistries  Recent Labs  Lab 08/07/17 1020  08/09/17 0528  NA 134*   < > 134*  K 5.9*   < > 4.2  CL 108   < > 107  CO2 21*   < > 19*  GLUCOSE 89   < > 90  BUN 32*   < > 20  CREATININE 1.90*   < > 1.74*  CALCIUM 8.6*   < > 8.5*  AST 35  --   --   ALT 24  --   --   ALKPHOS 57  --   --   BILITOT 0.6  --   --    < > = values in this interval not displayed.     Microbiology Results  No results found for this or any previous visit.  RADIOLOGY:  US Renal Artery Duplex Complete  Result Date: 08/09/2017 CLINICAL DATA:  Hypertension and chronic kidney disease. EXAM: RENAL/URINARY TRACT ULTRASOUND RENAL DUPLEX DOPPLER ULTRASOUND COMPARISON:  None. FINDINGS: Right Kidney: Length: 8.9 cm. Mildly increased cortical echogenicity without evidence of significant atrophy, hydronephrosis or focal lesion. Left Kidney: Length: 9.4 cm. Mildly increased cortical echogenicity without  evidence of significant atrophy, hydronephrosis or focal lesion. Bladder:  Unremarkable. RENAL DUPLEX ULTRASOUND Right Renal Artery Velocities: Origin:  167 cm/sec Mid:  89 cm/sec Hilum:  85 cm/sec Interlobar:  29 cm/sec Arcuate:  20 cm/sec Left Renal Artery Velocities: Origin:  88 cm/sec Mid:  145 cm/sec Hilum:  69 cm/sec Interlobar:  34 cm/sec Arcuate:  19 2 cm/sec Aortic Velocity:  114 cm/sec Right Renal-Aortic Ratios: Origin: 1.5 Mid:  0.8 Hilum: 0.7 Interlobar: 0.3 Arcuate: 0.2 Left Renal-Aortic Ratios: Origin: 0.8 Mid: 1.3 Hilum: 0.6 Interlobar: 0.3 Arcuate: 0.2 Renal veins are normally patent bilaterally. No evidence of significant velocity elevation or elevated renal artery ratios to suggest significant renal artery stenosis bilaterally. IMPRESSION: 1. Both kidneys demonstrate increased cortical echogenicity suggestive of underlying chronic  kidney disease. No evidence of hydronephrosis. 2. No evidence of renal artery stenosis bilaterally by duplex ultrasound. Electronically Signed   By: Aletta Edouard M.D.   On: 08/09/2017 11:21     Management plans discussed with the patient, family and they are in agreement.  CODE STATUS: Full Code   TOTAL TIME TAKING CARE OF THIS PATIENT: 35 minutes.    Demetrios Loll M.D on 08/09/2017 at 3:13 PM  Between 7am to 6pm - Pager - [phone]  After 6pm go to [email or web address] - Proofreader  [facility]  Office  [phone]  CC: Primary care physician; Delton See, RN   Note: This dictation was prepared with Dragon dictation along with smaller phrase technology. Any transcriptional errors that result from this process are unintentional.

## 2017-08-09 NOTE — Progress Notes (Signed)
Discharge instructions explained to pt/ verbalized an understanding/ iv and tele removed / RX given to pt/ care mag assisted with RX fill/ will transport off unit when ride arrives

## 2017-08-09 NOTE — Care Management (Addendum)
Patient moved to [place] one month ago.  She does not have insurance "but I had something when I lived in [place] but I don't know what it was."  Says she receives assistance through [employer] for her hep C medications.  Provided patient with application for [facility]. Instructed her on how to complete.  [facility] and instructed her on location so can obtain meds at discharge. Also had her an appointment made at sliding scale clinic- [facility].  It is documented in patient record patient is HIV positive. Patient denies she is HIV positive

## 2017-08-10 LAB — HIV 1/2 AB DIFFERENTIATION
HIV 1 Ab: POSITIVE — AB
HIV 2 Ab: NEGATIVE

## 2017-08-10 LAB — HIV ANTIBODY (ROUTINE TESTING W REFLEX): HIV SCREEN 4TH GENERATION: REACTIVE — AB

## 2017-08-11 ENCOUNTER — Telehealth: Payer: Self-pay | Admitting: Infectious Disease

## 2017-08-11 NOTE — Telephone Encounter (Signed)
Patient with known HIV. Had test done as part of admission orders but is followed at [facility] where last VL was <20

## 2017-08-23 ENCOUNTER — Ambulatory Visit: Payer: Medicaid Other | Admitting: Pharmacy Technician

## 2017-09-02 ENCOUNTER — Telehealth: Payer: Self-pay | Admitting: Pharmacy Technician

## 2017-09-02 NOTE — Telephone Encounter (Signed)
Patient failed to provide requested financial information.  Patient had an eligibility appt on 08/23/17 at 9:00a.m.  Patient was a no show to the appointment.  Attempted to call patient.  Patient has not returned phone call.  No additional medication assistance will be provided by [facility] without the required proof of income documentation.  Patient notified by letter.  [facility]

## 2018-05-05 ENCOUNTER — Emergency Department
Admission: EM | Admit: 2018-05-05 | Discharge: 2018-05-05 | Disposition: A | Discharge status: Home / Self Care | Payer: Medicaid Other | Attending: Emergency Medicine | Admitting: Emergency Medicine

## 2018-05-05 ENCOUNTER — Encounter: Payer: Self-pay | Admitting: Emergency Medicine

## 2018-05-05 ENCOUNTER — Emergency Department: Payer: Medicaid Other

## 2018-05-05 DIAGNOSIS — D696 Thrombocytopenia, unspecified: Secondary | ICD-10-CM | POA: Insufficient documentation

## 2018-05-05 DIAGNOSIS — F17200 Nicotine dependence, unspecified, uncomplicated: Secondary | ICD-10-CM | POA: Insufficient documentation

## 2018-05-05 DIAGNOSIS — I10 Essential (primary) hypertension: Secondary | ICD-10-CM

## 2018-05-05 DIAGNOSIS — Z79899 Other long term (current) drug therapy: Secondary | ICD-10-CM | POA: Insufficient documentation

## 2018-05-05 DIAGNOSIS — J40 Bronchitis, not specified as acute or chronic: Secondary | ICD-10-CM

## 2018-05-05 DIAGNOSIS — B20 Human immunodeficiency virus [HIV] disease: Secondary | ICD-10-CM | POA: Insufficient documentation

## 2018-05-05 LAB — CBC WITH DIFFERENTIAL/PLATELET
Abs Immature Granulocytes: 0.01 10*3/uL (ref 0.00–0.07)
Basophils Absolute: 0 10*3/uL (ref 0.0–0.1)
Basophils Relative: 1 %
EOS ABS: 0.1 10*3/uL (ref 0.0–0.5)
EOS PCT: 2 %
HEMATOCRIT: 40.9 % (ref 36.0–46.0)
HEMOGLOBIN: 13.3 g/dL (ref 12.0–15.0)
Immature Granulocytes: 0 %
LYMPHS ABS: 0.9 10*3/uL (ref 0.7–4.0)
LYMPHS PCT: 19 %
MCH: 31.7 pg (ref 26.0–34.0)
MCHC: 32.5 g/dL (ref 30.0–36.0)
MCV: 97.4 fL (ref 80.0–100.0)
MONO ABS: 0.4 10*3/uL (ref 0.1–1.0)
Monocytes Relative: 8 %
NRBC: 0 % (ref 0.0–0.2)
Neutro Abs: 3.4 10*3/uL (ref 1.7–7.7)
Neutrophils Relative %: 70 %
PLATELETS: 118 10*3/uL — AB (ref 150–400)
RBC: 4.2 MIL/uL (ref 3.87–5.11)
RDW: 13.3 % (ref 11.5–15.5)
WBC: 4.9 10*3/uL (ref 4.0–10.5)

## 2018-05-05 LAB — BRAIN NATRIURETIC PEPTIDE: B NATRIURETIC PEPTIDE 5: 587 pg/mL — AB (ref 0.0–100.0)

## 2018-05-05 LAB — COMPREHENSIVE METABOLIC PANEL
ALT: 19 U/L (ref 0–44)
ANION GAP: 5 (ref 5–15)
AST: 35 U/L (ref 15–41)
Albumin: 3.4 g/dL — ABNORMAL LOW (ref 3.5–5.0)
Alkaline Phosphatase: 51 U/L (ref 38–126)
BUN: 23 mg/dL — ABNORMAL HIGH (ref 6–20)
CHLORIDE: 110 mmol/L (ref 98–111)
CO2: 19 mmol/L — AB (ref 22–32)
CREATININE: 1.57 mg/dL — AB (ref 0.44–1.00)
Calcium: 8 mg/dL — ABNORMAL LOW (ref 8.9–10.3)
GFR calc non Af Amer: 40 mL/min — ABNORMAL LOW (ref >60)
GFR, EST AFRICAN AMERICAN: 46 mL/min — AB (ref >60)
Glucose, Bld: 96 mg/dL (ref 70–99)
Potassium: 5.1 mmol/L (ref 3.5–5.1)
SODIUM: 134 mmol/L — AB (ref 135–145)
Total Bilirubin: 0.6 mg/dL (ref 0.3–1.2)
Total Protein: 9.2 g/dL — ABNORMAL HIGH (ref 6.5–8.1)

## 2018-05-05 LAB — TROPONIN I: Troponin I: <0.03 ng/mL (ref <0.03)

## 2018-05-05 MED ORDER — LISINOPRIL 40 MG PO TABS: 40.0000 mg | ORAL_TABLET | Freq: Every day | ORAL | 0 refills | Status: DC

## 2018-05-05 MED ORDER — CARVEDILOL 25 MG PO TABS: 25.0000 mg | ORAL_TABLET | Freq: Two times a day (BID) | ORAL | 1 refills | Status: DC

## 2018-05-05 MED ORDER — AMLODIPINE BESYLATE 10 MG PO TABS: 10.0000 mg | ORAL_TABLET | Freq: Every day | ORAL | 0 refills | Status: DC

## 2018-05-05 MED ORDER — LISINOPRIL 10 MG PO TABS
40.0000 mg | ORAL_TABLET | Freq: Once | ORAL | Status: AC
  Administered 2018-05-05: 40 mg via ORAL
  Filled 2018-05-05: qty 4

## 2018-05-05 MED ORDER — AMLODIPINE BESYLATE 10 MG PO TABS: 10.0000 mg | ORAL_TABLET | Freq: Every day | ORAL | 1 refills | Status: DC

## 2018-05-05 MED ORDER — AMLODIPINE BESYLATE 5 MG PO TABS
10.0000 mg | ORAL_TABLET | Freq: Once | ORAL | Status: AC
  Administered 2018-05-05: 10 mg via ORAL
  Filled 2018-05-05: qty 2

## 2018-05-05 NOTE — ED Notes (Signed)
First Nurse Note: Patient complaining of [place] worse when she lies down.  Hx of CHF.  Alert and oriented, speaking in full sentences.

## 2018-05-05 NOTE — ED Triage Notes (Signed)
Pt reports this am she started with SOB at 4am. Pt reports when she tries to lay down it gets worse and she cant breathe. Denies CP, dizziness, cough or other sx's.

## 2018-05-05 NOTE — ED Provider Notes (Signed)
[facility] Emergency Department Provider Note ____________________________________________   I have reviewed the triage vital signs and the triage nursing note.  HISTORY  Chief Complaint Shortness of Breath   Historian Patient  HPI Rebecca Bridges is a 42 y.o. female with a history of hypertension, been off of lisinopril sounds like for several months, presents to the ED with shortness of breath that she noticed while laying flat when she woke up this morning.  She was wondering whether or not she may have fluid on her lungs.  No cough or fevers or sputum production.  No lower extremity edema.  No chest pain.  Symptoms are mild and currently at rest not short of breath.   HIV pos -has not been on medications for over 4 months.  She is recently returned to this area.  She had previously followed at [facility] but has not followed in quite some time.    Past Medical History:  Diagnosis Date  . Hypertension     Patient Active Problem List   Diagnosis Date Noted  . HTN (hypertension), malignant 08/07/2017    Past Surgical History:  Procedure Laterality Date  . CHOLECYSTECTOMY      Prior to Admission medications   Medication Sig Start Date End Date Taking? Authorizing Provider  amLODipine (NORVASC) 10 MG tablet Take 1 tablet (10 mg total) by mouth daily. 05/05/18   Lisa Roca, MD  carvedilol (COREG) 25 MG tablet Take 1 tablet (25 mg total) by mouth 2 (two) times daily with a meal. 05/05/18   Lisa Roca, MD  dapsone 100 MG tablet Take 100 mg by mouth daily.    [provider]  dolutegravir (TIVICAY) 50 MG tablet Take 50 mg by mouth daily.    [provider]  emtricitabine-tenofovir AF (DESCOVY) 200-25 MG tablet Take 1 tablet by mouth daily.    [provider]  hydrALAZINE (APRESOLINE) 50 MG tablet Take 1 tablet (50 mg total) by mouth every 8 (eight) hours. 08/09/17   Demetrios Loll, MD  lisinopril (PRINIVIL,ZESTRIL) 40 MG tablet  Take 1 tablet (40 mg total) by mouth daily. 05/05/18   Lisa Roca, MD    Allergies  Allergen Reactions  . Sulfa Antibiotics     No family history on file.  Social History Social History   Tobacco Use  . Smoking status: Current Some Day Smoker  . Smokeless tobacco: Never Used  Substance Use Topics  . Alcohol use: No    Frequency: Never  . Drug use: No    Review of Systems  Constitutional: Negative for fever. Eyes: Negative for visual changes. ENT: Negative for sore throat. Cardiovascular: Negative for chest pain. Respiratory: Positive as per HPI for shortness of breath. Gastrointestinal: Negative for abdominal pain, vomiting and diarrhea. Genitourinary: Negative for dysuria. Musculoskeletal: Negative for back pain. Skin: Negative for rash. Neurological: Negative for headache.  ____________________________________________   PHYSICAL EXAM:  VITAL SIGNS: ED Triage Vitals  Enc Vitals Group     BP 05/05/18 0939 (!) 187/115     Pulse Rate 05/05/18 0939 (!) 115     Resp 05/05/18 0939 19     Temp 05/05/18 0939 98.2 F (36.8 C)     Temp Source 05/05/18 0939 Oral     SpO2 05/05/18 0939 100 %     Weight 05/05/18 0939 122 lb (55.3 kg)     Height 05/05/18 0939 5\' 3"  (1.6 m)     Head Circumference --      Peak Flow --  Pain Score 05/05/18 0946 0     Pain Loc --      Pain Edu? --      Excl. in [place]? --      Constitutional: Alert and oriented.  HEENT      Head: Normocephalic and atraumatic.      Eyes: Conjunctivae are normal. Pupils equal and round.       Ears:         Nose: No congestion/rhinnorhea.      Mouth/Throat: Mucous membranes are moist.      Neck: No stridor. Cardiovascular/Chest: Normal rate, regular rhythm.  No murmurs, rubs, or gallops. Respiratory: Normal respiratory effort without tachypnea nor retractions. Breath sounds are clear and equal bilaterally. No wheezes/rales/rhonchi. Gastrointestinal: Soft. No distention, no guarding, no rebound.  Nontender.    Genitourinary/rectal:Deferred Musculoskeletal: Nontender with normal range of motion in all extremities. No joint effusions.  No lower extremity tenderness.  No edema. Neurologic:  Normal speech and language. No gross or focal neurologic deficits are appreciated. Skin:  Skin is warm, dry and intact. No rash noted. Psychiatric: Mood and affect are normal. Speech and behavior are normal. Patient exhibits appropriate insight and judgment.   ____________________________________________  LABS (pertinent positives/negatives) I, Lisa Roca, MD the attending physician have reviewed the labs noted below.  Labs Reviewed  CBC WITH DIFFERENTIAL/PLATELET - Abnormal; Notable for the following components:      Result Value   Platelets 118 (*)    All other components within normal limits  COMPREHENSIVE METABOLIC PANEL - Abnormal; Notable for the following components:   Sodium 134 (*)    CO2 19 (*)    BUN 23 (*)    Creatinine, Ser 1.57 (*)    Calcium 8.0 (*)    Total Protein 9.2 (*)    Albumin 3.4 (*)    GFR calc non Af Amer 40 (*)    GFR calc Af Amer 46 (*)    All other components within normal limits  BRAIN NATRIURETIC PEPTIDE - Abnormal; Notable for the following components:   B Natriuretic Peptide 587.0 (*)    All other components within normal limits  TROPONIN I    ____________________________________________    EKG I, Lisa Roca, MD, the attending physician have personally viewed and interpreted all ECGs.  106 bpm.  Sinus tachycardia.  Left axis deviation.  Left bundle branch block.  EKG is similar to prior. ____________________________________________  RADIOLOGY   Chest x-ray two-view:  IMPRESSION: Mild bilateral peribronchial cuffing noted suggesting mild bronchitis. __________________________________________  PROCEDURES  Procedure(s) performed: None  Procedures  Critical Care performed: None   ____________________________________________  ED  COURSE / ASSESSMENT AND PLAN  Pertinent labs & imaging results that were available during my care of the patient were reviewed by me and considered in my medical decision making (see chart for details).    Asymptomatic here, she does have severe hypertension and has been off medications which she states was lisinopril and she was not sure the dose.  In chart review, it appears that earlier this year she had been discharged from the hospital on Coreg, lisinopril, as well as Norvasc.  I am to go ahead and give her a dose of lisinopril in Norvasc right now.  Shortness of breath upon lying flat this morning, but not at rest now.  EKG shows left bundle branch block which is similar to prior.  No pleuritic chest pain or chest pain at all.  No leg swelling.  Not suspicious for PE.  Chest x-ray without signs of pneumonia.  I do not have high suspicion for PCP.  Discussed with infectious disease physician, given that we do not have any recent CD4 count or viral load, however given symptoms does not seem highly likely and was not recommended to pursue this further at this point time.  I was given the number to give the patient for following up.  Social work was able to discuss with patient resources for medication management, primary care physician, as well as going to [facility] for infectious disease.  Pancytopenia appears to be chronic.  CONSULTATIONS:   Infectious disease physician -- ok to treat as bronchitis -low suspicion clinically for PCP based on x-ray and no hypoxia and no fevers.  Did give me the number for Almyra Free the case manager at [facility] for infectious disease follow-up.   Patient / Family / Caregiver informed of clinical course, medical decision-making process, and agree with plan.   I discussed return precautions, follow-up instructions, and discharge instructions with patient and/or family.  Discharge Instructions: Return to the emergency  department immediately for any fever, shortness of breath, chest pain, dizziness or passing out, or any other symptoms concerning to you.  ___________________________________________   FINAL CLINICAL IMPRESSION(S) / ED DIAGNOSES   Final diagnoses:  Essential hypertension  Bronchitis  Thrombocytopenia ([place])      ___________________________________________         Note: This dictation was prepared with Dragon dictation. Any transcriptional errors that result from this process are unintentional    Lisa Roca, MD 05/05/18 1520

## 2018-05-05 NOTE — Care Management Note (Signed)
Case Management Note  Patient Details  Name: Rebecca Bridges MRN: [id number] Date of Birth: 07-17-1975  Subjective/Objective:   Patient is being seen in the ED for shortness of breath.  Patient has been off of her BP medications, she does not have insurance.  Patient reports she has not seen a doctor since the end of last year.  RNCM gave patient ODC/MM application and a list of other [employer].  Information for Home Care providers HIV case management also given, patient has not been seeing a specialist and needs to follow up for HIV management.  Number for HIV Case manager is [phone].  Patient gave permission for [facility] to refer to MM and [place].  Patient reports that she lives alone, she drives, and she works at [employer]  Dr. Reita Cliche is discharging patient home with prescriptions for lisinopril, norvasc and metoprolol.  All three of the medications are available on the $4 list at [facility].  Patient reports that she can afford the $4 medications.                 Doran Clay [facility] [phone]  Action/Plan: Patient will follow up outpatient.    Expected Discharge Date:                  Expected Discharge Plan:  Home/Self Care  In-House Referral:     Discharge planning Services  CM Consult  Post Acute Care Choice:    Choice offered to:     DME Arranged:    DME Agency:     HH Arranged:    [place] Agency:     Status of Service:  Completed, signed off  If discussed at [employer] of Stay Meetings, dates discussed:    Additional Comments:  Shelbie Hutching, RN 05/05/2018, 3:27 PM

## 2018-05-05 NOTE — Discharge Instructions (Signed)
Return to the emergency department immediately for any fever, shortness of breath, chest pain, dizziness or passing out, or any other symptoms concerning to you.

## 2018-05-09 ENCOUNTER — Telehealth: Payer: Self-pay | Admitting: Pharmacy Technician

## 2018-05-09 NOTE — Telephone Encounter (Signed)
Provided patient with new patient packet to obtain [facility] services.  Patient understands that [facility] must receive 2019 financial documentation in order to determine eligibility and continued medication assistance.  [place] [facility]

## 2018-05-18 ENCOUNTER — Telehealth: Payer: Self-pay

## 2018-05-18 NOTE — Telephone Encounter (Signed)
Called and left msg about eligibility.

## 2018-11-21 ENCOUNTER — Other Ambulatory Visit: Payer: Self-pay

## 2018-11-21 ENCOUNTER — Emergency Department
Admission: EM | Admit: 2018-11-21 | Discharge: 2018-11-21 | Disposition: A | Discharge status: Home / Self Care | Payer: Medicaid Other | Attending: Emergency Medicine | Admitting: Emergency Medicine

## 2018-11-21 DIAGNOSIS — R109 Unspecified abdominal pain: Secondary | ICD-10-CM | POA: Insufficient documentation

## 2018-11-21 DIAGNOSIS — Z5321 Procedure and treatment not carried out due to patient leaving prior to being seen by health care provider: Secondary | ICD-10-CM | POA: Insufficient documentation

## 2018-11-21 LAB — CBC
HCT: 40.7 % (ref 36.0–46.0)
Hemoglobin: 13.3 g/dL (ref 12.0–15.0)
MCH: 32.4 pg (ref 26.0–34.0)
MCHC: 32.7 g/dL (ref 30.0–36.0)
MCV: 99 fL (ref 80.0–100.0)
Platelets: 116 10*3/uL — ABNORMAL LOW (ref 150–400)
RBC: 4.11 MIL/uL (ref 3.87–5.11)
RDW: 15 % (ref 11.5–15.5)
WBC: 3.5 10*3/uL — ABNORMAL LOW (ref 4.0–10.5)
nRBC: 0 % (ref 0.0–0.2)

## 2018-11-21 LAB — COMPREHENSIVE METABOLIC PANEL
ALT: 11 U/L (ref 0–44)
AST: 21 U/L (ref 15–41)
Albumin: 3.3 g/dL — ABNORMAL LOW (ref 3.5–5.0)
Alkaline Phosphatase: 57 U/L (ref 38–126)
Anion gap: 7 (ref 5–15)
BUN: 25 mg/dL — ABNORMAL HIGH (ref 6–20)
CO2: 19 mmol/L — ABNORMAL LOW (ref 22–32)
Calcium: 8.1 mg/dL — ABNORMAL LOW (ref 8.9–10.3)
Chloride: 105 mmol/L (ref 98–111)
Creatinine, Ser: 1.77 mg/dL — ABNORMAL HIGH (ref 0.44–1.00)
GFR calc Af Amer: 40 mL/min — ABNORMAL LOW (ref >60)
GFR calc non Af Amer: 35 mL/min — ABNORMAL LOW (ref >60)
Glucose, Bld: 103 mg/dL — ABNORMAL HIGH (ref 70–99)
Potassium: 4.3 mmol/L (ref 3.5–5.1)
Sodium: 131 mmol/L — ABNORMAL LOW (ref 135–145)
Total Bilirubin: 0.8 mg/dL (ref 0.3–1.2)
Total Protein: 9.3 g/dL — ABNORMAL HIGH (ref 6.5–8.1)

## 2018-11-21 LAB — LIPASE, BLOOD: Lipase: 36 U/L (ref 11–51)

## 2018-11-21 LAB — POCT PREGNANCY, URINE: Preg Test, Ur: NEGATIVE

## 2018-11-21 LAB — URINALYSIS, COMPLETE (UACMP) WITH MICROSCOPIC
Bilirubin Urine: NEGATIVE
Glucose, UA: NEGATIVE mg/dL
Ketones, ur: NEGATIVE mg/dL
Leukocytes,Ua: NEGATIVE
Nitrite: NEGATIVE
Protein, ur: 100 mg/dL — AB
Specific Gravity, Urine: 1.014 (ref 1.005–1.030)
pH: 6 (ref 5.0–8.0)

## 2018-11-21 MED ORDER — SODIUM CHLORIDE 0.9% FLUSH: 3.0000 mL | Freq: Once | INTRAVENOUS | Status: DC

## 2018-11-21 NOTE — ED Triage Notes (Signed)
Pt in from home d/t diarrhea and L sided abd pain/tightness; denies vomiting but states nausea. A&Ox4.

## 2018-11-21 NOTE — ED Notes (Signed)
Lav/grn tubes sent to lab.

## 2018-11-21 NOTE — ED Notes (Signed)
Pt unable to eat well as of last 2 days; N/D; LUQ pain. States mother has had kidney stones. Pt has had gallbladder removed.

## 2019-01-08 ENCOUNTER — Other Ambulatory Visit: Payer: Self-pay

## 2019-01-08 ENCOUNTER — Emergency Department: Payer: Self-pay

## 2019-01-08 ENCOUNTER — Encounter: Payer: Self-pay | Admitting: Emergency Medicine

## 2019-01-08 ENCOUNTER — Emergency Department
Admission: EM | Admit: 2019-01-08 | Discharge: 2019-01-08 | Disposition: A | Discharge status: Home / Self Care | Payer: Self-pay | Attending: Emergency Medicine | Admitting: Emergency Medicine

## 2019-01-08 DIAGNOSIS — Z20828 Contact with and (suspected) exposure to other viral communicable diseases: Secondary | ICD-10-CM | POA: Insufficient documentation

## 2019-01-08 DIAGNOSIS — I110 Hypertensive heart disease with heart failure: Secondary | ICD-10-CM | POA: Insufficient documentation

## 2019-01-08 DIAGNOSIS — R0602 Shortness of breath: Secondary | ICD-10-CM | POA: Insufficient documentation

## 2019-01-08 DIAGNOSIS — F17200 Nicotine dependence, unspecified, uncomplicated: Secondary | ICD-10-CM | POA: Insufficient documentation

## 2019-01-08 DIAGNOSIS — I10 Essential (primary) hypertension: Secondary | ICD-10-CM

## 2019-01-08 DIAGNOSIS — F419 Anxiety disorder, unspecified: Secondary | ICD-10-CM | POA: Insufficient documentation

## 2019-01-08 DIAGNOSIS — I509 Heart failure, unspecified: Secondary | ICD-10-CM | POA: Insufficient documentation

## 2019-01-08 DIAGNOSIS — Z79899 Other long term (current) drug therapy: Secondary | ICD-10-CM | POA: Insufficient documentation

## 2019-01-08 HISTORY — DX: Heart failure, unspecified: I50.9

## 2019-01-08 LAB — TROPONIN I (HIGH SENSITIVITY): Troponin I (High Sensitivity): 18 ng/L — ABNORMAL HIGH (ref <18)

## 2019-01-08 MED ORDER — CLONIDINE HCL 0.1 MG PO TABS
0.2000 mg | ORAL_TABLET | Freq: Once | ORAL | Status: AC
  Administered 2019-01-08: 0.2 mg via ORAL
  Filled 2019-01-08: qty 2

## 2019-01-08 MED ORDER — AMLODIPINE BESYLATE 10 MG PO TABS: 10.0000 mg | ORAL_TABLET | Freq: Every day | ORAL | 0 refills | Status: DC

## 2019-01-08 MED ORDER — LISINOPRIL 40 MG PO TABS: 40.0000 mg | ORAL_TABLET | Freq: Every day | ORAL | 0 refills | Status: DC

## 2019-01-08 MED ORDER — HYDROCHLOROTHIAZIDE 12.5 MG PO TABS: 12.5000 mg | ORAL_TABLET | Freq: Every day | ORAL | 0 refills | Status: DC

## 2019-01-08 NOTE — ED Triage Notes (Signed)
Pt states that she has hx/o HTN, pt has been out of blood pressure medication x 1 month. Pt states that she just moved to [place] and her PCP was at [facility].

## 2019-01-08 NOTE — ED Notes (Signed)
Ambulated pt. PT states she feels short of breath but no obvious work of breathing noted. Oxygen saturation greater than 93%

## 2019-01-08 NOTE — ED Triage Notes (Signed)
Pt to ED via POV c/o shortness of breath. Pt states that she feels more short of breath when laying flat. Pt states that she has also had some chest tightness. Pt reports hx/o CHF. Pt denies swelling in her extremities. Pt appears anxious in triage and is tearful.

## 2019-01-08 NOTE — Discharge Instructions (Signed)
Your COVID test results should be back within the next couple of days.  You should quarantine yourself until you await the results.  If the test is negative, you may resume your normal daily activities.  If the test is positive, you will need to quarantine yourself for 2 weeks +72 hours after symptoms have resolved without any medications.  You need to establish a primary care provider as soon as possible.  You have been given another medication for 1 month which should provide you with enough time to get an appointment.  Return to the emergency department for any symptom that changes or worsens if you are unable to schedule appointment.

## 2019-01-08 NOTE — ED Provider Notes (Signed)
[facility] Emergency Department Provider Note  ___________________________________________   None    (approximate)  I have reviewed the triage vital signs and the nursing notes.   HISTORY  Chief Complaint Shortness of Breath  HPI Rebecca Bridges is a 43 y.o. female who presents to the emergency department for evaluation of hypertension. She has been out of amlodipine and carvedilol for the past month since she moved to [place].  Her primary care provider is at [facility].  She states that over the past 2 or 3 nights, she has been very short of breath when lying down.  This scares her and causes her to have significant anxiety.  She also states that she has a history of congestive heart failure.  She denies increase in shortness of breath with exertion and denies any extremity swelling.  She is also denying chest pain.  No known exposure to COVID-19, but she states that she does work in [employer].     Past Medical History:  Diagnosis Date  . CHF (congestive heart failure) ([place])   . Hypertension     Patient Active Problem List   Diagnosis Date Noted  . HTN (hypertension), malignant 08/07/2017    Past Surgical History:  Procedure Laterality Date  . CHOLECYSTECTOMY      Prior to Admission medications   Medication Sig Start Date End Date Taking? Authorizing Provider  amLODipine (NORVASC) 10 MG tablet Take 1 tablet (10 mg total) by mouth daily. 01/08/19   Laquan Ludden, Johnette Abraham B, FNP  carvedilol (COREG) 25 MG tablet Take 1 tablet (25 mg total) by mouth 2 (two) times daily with a meal. 05/05/18   Lisa Roca, MD  dapsone 100 MG tablet Take 100 mg by mouth daily.    [provider]  dolutegravir (TIVICAY) 50 MG tablet Take 50 mg by mouth daily.    [provider]  emtricitabine-tenofovir AF (DESCOVY) 200-25 MG tablet Take 1 tablet by mouth daily.    [provider]  hydrALAZINE (APRESOLINE) 50 MG tablet Take 1 tablet (50 mg total) by mouth  every 8 (eight) hours. 08/09/17   Demetrios Loll, MD  hydrochlorothiazide (HYDRODIURIL) 12.5 MG tablet Take 1 tablet (12.5 mg total) by mouth daily. 01/08/19   Marton Malizia B, FNP  lisinopril (ZESTRIL) 40 MG tablet Take 1 tablet (40 mg total) by mouth daily. 01/08/19   Criston Chancellor, Johnette Abraham B, FNP    Allergies Sulfa antibiotics  No family history on file.  Social History Social History   Tobacco Use  . Smoking status: Current Every Day Smoker  . Smokeless tobacco: Never Used  Substance Use Topics  . Alcohol use: No    Frequency: Never  . Drug use: No    Review of Systems  Constitutional: No fever/chills Eyes: No visual changes. ENT: No sore throat. Cardiovascular: Denies chest pain. Respiratory: Positive for shortness of breath. Gastrointestinal: No abdominal pain.  No nausea, no vomiting.  No diarrhea.  No constipation. Genitourinary: Negative for dysuria. Musculoskeletal: Negative for back pain. Skin: Negative for rash. Neurological: Negative for headaches, focal weakness or numbness. ____________________________________________   PHYSICAL EXAM:  VITAL SIGNS: ED Triage Vitals  Enc Vitals Group     BP 01/08/19 1415 (!) 163/118     Pulse Rate 01/08/19 1415 (!) 104     Resp --      Temp 01/08/19 1415 98.7 F (37.1 C)     Temp Source 01/08/19 1415 Oral     SpO2 01/08/19 1415 100 %  Weight 01/08/19 1417 115 lb (52.2 kg)     Height 01/08/19 1417 5\' 3"  (1.6 m)     Head Circumference --      Peak Flow --      Pain Score 01/08/19 1417 0     Pain Loc --      Pain Edu? --      Excl. in [place]? --     Constitutional: Alert and oriented. Well appearing and in no acute distress. Eyes: Conjunctivae are normal. PERRL. Head: Atraumatic. Nose: No congestion/rhinnorhea. Mouth/Throat: Mucous membranes are moist.  Oropharynx non-erythematous. Neck: No stridor.   Cardiovascular: Normal rate, regular rhythm. Grossly normal heart sounds.  Good peripheral circulation. No edema of the  lower extremities. Respiratory: Normal respiratory effort.  No retractions. Lungs CTAB. Gastrointestinal: Soft and nontender. No distention. No abdominal bruits. No CVA tenderness. Musculoskeletal: No lower extremity tenderness nor edema.  No joint effusions. Neurologic:  Normal speech and language. No gross focal neurologic deficits are appreciated. No gait instability. Skin:  Skin is warm, dry and intact. No rash noted. Psychiatric: Mood and affect are normal. Speech and behavior are normal.  ____________________________________________   LABS (all labs ordered are listed, but only abnormal results are displayed)  Labs Reviewed  NOVEL CORONAVIRUS, NAA (HOSPITAL ORDER, SEND-OUT TO REF LAB)  CBC WITH DIFFERENTIAL/PLATELET  BASIC METABOLIC PANEL  BRAIN NATRIURETIC PEPTIDE  TROPONIN I (HIGH SENSITIVITY)  TROPONIN I (HIGH SENSITIVITY)   ____________________________________________  EKG  ED ECG REPORT I, Abrham Maslowski, FNP-BC personally viewed and interpreted this ECG.   Date: 01/08/2019  EKG Time: 1426  Rate: 100  Rhythm: unchanged from previous tracings, sinus tachycardia  Axis: left  Intervals:none  ST&T Change: no ST change. No ST elevation  ____________________________________________  RADIOLOGY  ED MD interpretation: Cardiomegaly without acute changes.  Official radiology report(s): Dg Chest 2 View  Result Date: 01/08/2019 CLINICAL DATA:  History of CHF, shortness of breath EXAM: CHEST - 2 VIEW COMPARISON:  05/05/2018 FINDINGS: Cardiomegaly. Both lungs are clear. The visualized skeletal structures are unremarkable. IMPRESSION: Cardiomegaly without acute abnormality of the lungs. Electronically Signed   By: Eddie Candle M.D.   On: 01/08/2019 14:46    ____________________________________________   PROCEDURES  Procedure(s) performed: None  Procedures  Critical Care performed: No  ____________________________________________   INITIAL IMPRESSION /  ASSESSMENT AND PLAN / ED COURSE     43 year old female presenting to the emergency department for treatment and evaluation of hypertension and shortness of breath that happens at night when lying down.  She has not been taking her blood pressure medications for the past month or so.  Protocol labs were started.  Chest x-ray results are back and reassuring.  Catapres will be given and we will keep an eye on her blood pressure.  Because of the shortness of breath and the fact that she does work outside the home, a send out COVID-19 test will be performed as well.  Patient felt better after blood pressure started to come down. I will refill her BP meds but advised her that she will have to find a PCP for additional refills. She was advised to return to the ER for any symptom of concern. She was advised to stay home until her COVID-19 test is back and if negative she can resume her normal daily activities.  If the test is positive, she will need to quarantine herself for 2 weeks +72 hours after her last symptom without medication.  Patient verbalized understanding.  Rebecca Bridges  was evaluated in Emergency Department on 01/08/2019 for the symptoms described in the history of present illness. She was evaluated in the context of the global COVID-19 pandemic, which necessitated consideration that the patient might be at risk for infection with the SARS-CoV-2 virus that causes COVID-19. Institutional protocols and algorithms that pertain to the evaluation of patients at risk for COVID-19 are in a state of rapid change based on information released by regulatory bodies including the CDC and federal and state organizations. These policies and algorithms were followed during the patient's care in the ED.  ____________________________________________   FINAL CLINICAL IMPRESSION(S) / ED DIAGNOSES  Final diagnoses:  Shortness of breath  Hypertension, unspecified type     ED Discharge Orders         Ordered     amLODipine (NORVASC) 10 MG tablet  Daily     01/08/19 1723    lisinopril (ZESTRIL) 40 MG tablet  Daily     01/08/19 1723    hydrochlorothiazide (HYDRODIURIL) 12.5 MG tablet  Daily     01/08/19 1723           Note:  This document was prepared using Dragon voice recognition software and may include unintentional dictation errors.    Victorino Dike, FNP 01/08/19 1913    Earleen Newport, MD 01/08/19 2020

## 2019-01-10 LAB — NOVEL CORONAVIRUS, NAA (HOSP ORDER, SEND-OUT TO REF LAB; TAT 18-24 HRS): SARS-CoV-2, NAA: NOT DETECTED

## 2019-01-24 ENCOUNTER — Other Ambulatory Visit: Payer: Self-pay

## 2019-01-24 ENCOUNTER — Inpatient Hospital Stay: Payer: Medicaid Other

## 2019-01-24 ENCOUNTER — Emergency Department: Payer: Medicaid Other

## 2019-01-24 ENCOUNTER — Encounter: Payer: Self-pay | Admitting: Emergency Medicine

## 2019-01-24 ENCOUNTER — Inpatient Hospital Stay
Admission: EM | Admit: 2019-01-24 | Discharge: 2019-01-27 | DRG: 271 | Disposition: A | Discharge status: Home / Self Care | Payer: Medicaid Other | Attending: Internal Medicine | Admitting: Internal Medicine

## 2019-01-24 DIAGNOSIS — I700 Atherosclerosis of aorta: Secondary | ICD-10-CM | POA: Diagnosis present

## 2019-01-24 DIAGNOSIS — I743 Embolism and thrombosis of arteries of the lower extremities: Principal | ICD-10-CM

## 2019-01-24 DIAGNOSIS — I151 Hypertension secondary to other renal disorders: Secondary | ICD-10-CM

## 2019-01-24 DIAGNOSIS — B1920 Unspecified viral hepatitis C without hepatic coma: Secondary | ICD-10-CM | POA: Diagnosis present

## 2019-01-24 DIAGNOSIS — I5022 Chronic systolic (congestive) heart failure: Secondary | ICD-10-CM | POA: Diagnosis present

## 2019-01-24 DIAGNOSIS — Z882 Allergy status to sulfonamides status: Secondary | ICD-10-CM | POA: Diagnosis not present

## 2019-01-24 DIAGNOSIS — I130 Hypertensive heart and chronic kidney disease with heart failure and stage 1 through stage 4 chronic kidney disease, or unspecified chronic kidney disease: Secondary | ICD-10-CM | POA: Diagnosis present

## 2019-01-24 DIAGNOSIS — I998 Other disorder of circulatory system: Secondary | ICD-10-CM

## 2019-01-24 DIAGNOSIS — N179 Acute kidney failure, unspecified: Secondary | ICD-10-CM

## 2019-01-24 DIAGNOSIS — I447 Left bundle-branch block, unspecified: Secondary | ICD-10-CM | POA: Diagnosis present

## 2019-01-24 DIAGNOSIS — E871 Hypo-osmolality and hyponatremia: Secondary | ICD-10-CM | POA: Diagnosis present

## 2019-01-24 DIAGNOSIS — B159 Hepatitis A without hepatic coma: Secondary | ICD-10-CM | POA: Diagnosis present

## 2019-01-24 DIAGNOSIS — D72819 Decreased white blood cell count, unspecified: Secondary | ICD-10-CM | POA: Diagnosis present

## 2019-01-24 DIAGNOSIS — F17210 Nicotine dependence, cigarettes, uncomplicated: Secondary | ICD-10-CM | POA: Diagnosis present

## 2019-01-24 DIAGNOSIS — Z8249 Family history of ischemic heart disease and other diseases of the circulatory system: Secondary | ICD-10-CM

## 2019-01-24 DIAGNOSIS — M549 Dorsalgia, unspecified: Secondary | ICD-10-CM | POA: Diagnosis present

## 2019-01-24 DIAGNOSIS — I420 Dilated cardiomyopathy: Secondary | ICD-10-CM | POA: Diagnosis present

## 2019-01-24 DIAGNOSIS — I509 Heart failure, unspecified: Secondary | ICD-10-CM

## 2019-01-24 DIAGNOSIS — Z7901 Long term (current) use of anticoagulants: Secondary | ICD-10-CM

## 2019-01-24 DIAGNOSIS — I513 Intracardiac thrombosis, not elsewhere classified: Secondary | ICD-10-CM | POA: Diagnosis present

## 2019-01-24 DIAGNOSIS — I739 Peripheral vascular disease, unspecified: Secondary | ICD-10-CM | POA: Diagnosis present

## 2019-01-24 DIAGNOSIS — Z20828 Contact with and (suspected) exposure to other viral communicable diseases: Secondary | ICD-10-CM | POA: Diagnosis present

## 2019-01-24 DIAGNOSIS — N189 Chronic kidney disease, unspecified: Secondary | ICD-10-CM

## 2019-01-24 DIAGNOSIS — M79605 Pain in left leg: Secondary | ICD-10-CM

## 2019-01-24 DIAGNOSIS — N183 Chronic kidney disease, stage 3 (moderate): Secondary | ICD-10-CM | POA: Diagnosis present

## 2019-01-24 LAB — COMPREHENSIVE METABOLIC PANEL
ALT: 19 U/L (ref 0–44)
AST: 35 U/L (ref 15–41)
Albumin: 3.4 g/dL — ABNORMAL LOW (ref 3.5–5.0)
Alkaline Phosphatase: 63 U/L (ref 38–126)
Anion gap: 5 (ref 5–15)
BUN: 43 mg/dL — ABNORMAL HIGH (ref 6–20)
CO2: 21 mmol/L — ABNORMAL LOW (ref 22–32)
Calcium: 8.2 mg/dL — ABNORMAL LOW (ref 8.9–10.3)
Chloride: 103 mmol/L (ref 98–111)
Creatinine, Ser: 2.22 mg/dL — ABNORMAL HIGH (ref 0.44–1.00)
GFR calc Af Amer: 31 mL/min — ABNORMAL LOW (ref >60)
GFR calc non Af Amer: 26 mL/min — ABNORMAL LOW (ref >60)
Glucose, Bld: 94 mg/dL (ref 70–99)
Potassium: 4.4 mmol/L (ref 3.5–5.1)
Sodium: 129 mmol/L — ABNORMAL LOW (ref 135–145)
Total Bilirubin: 0.6 mg/dL (ref 0.3–1.2)
Total Protein: 9.8 g/dL — ABNORMAL HIGH (ref 6.5–8.1)

## 2019-01-24 LAB — CBC WITH DIFFERENTIAL/PLATELET
Abs Immature Granulocytes: 0.01 10*3/uL (ref 0.00–0.07)
Basophils Absolute: 0 10*3/uL (ref 0.0–0.1)
Basophils Relative: 1 %
Eosinophils Absolute: 0.1 10*3/uL (ref 0.0–0.5)
Eosinophils Relative: 2 %
HCT: 39 % (ref 36.0–46.0)
Hemoglobin: 12.8 g/dL (ref 12.0–15.0)
Immature Granulocytes: 0 %
Lymphocytes Relative: 13 %
Lymphs Abs: 0.6 10*3/uL — ABNORMAL LOW (ref 0.7–4.0)
MCH: 33.3 pg (ref 26.0–34.0)
MCHC: 32.8 g/dL (ref 30.0–36.0)
MCV: 101.6 fL — ABNORMAL HIGH (ref 80.0–100.0)
Monocytes Absolute: 0.3 10*3/uL (ref 0.1–1.0)
Monocytes Relative: 7 %
Neutro Abs: 3.4 10*3/uL (ref 1.7–7.7)
Neutrophils Relative %: 77 %
Platelets: 108 10*3/uL — ABNORMAL LOW (ref 150–400)
RBC: 3.84 MIL/uL — ABNORMAL LOW (ref 3.87–5.11)
RDW: 13.4 % (ref 11.5–15.5)
WBC: 4.4 10*3/uL (ref 4.0–10.5)
nRBC: 0 % (ref 0.0–0.2)

## 2019-01-24 LAB — PROTIME-INR
INR: 1.1 (ref 0.8–1.2)
Prothrombin Time: 13.9 seconds (ref 11.4–15.2)

## 2019-01-24 LAB — BRAIN NATRIURETIC PEPTIDE: B Natriuretic Peptide: 854 pg/mL — ABNORMAL HIGH (ref 0.0–100.0)

## 2019-01-24 LAB — HEPARIN LEVEL (UNFRACTIONATED): Heparin Unfractionated: 0.21 IU/mL — ABNORMAL LOW (ref 0.30–0.70)

## 2019-01-24 LAB — APTT: aPTT: 36 seconds (ref 24–36)

## 2019-01-24 LAB — SARS CORONAVIRUS 2 (TAT 6-24 HRS): SARS Coronavirus 2: NEGATIVE

## 2019-01-24 LAB — POCT PREGNANCY, URINE: Preg Test, Ur: NEGATIVE

## 2019-01-24 MED ORDER — MORPHINE SULFATE (PF) 2 MG/ML IV SOLN: 2.0000 mg | INTRAVENOUS | Status: DC | PRN

## 2019-01-24 MED ORDER — NICOTINE 21 MG/24HR TD PT24
21.0000 mg | MEDICATED_PATCH | Freq: Every day | TRANSDERMAL | Status: DC
  Administered 2019-01-24 – 2019-01-27 (×2): 21 mg via TRANSDERMAL
  Filled 2019-01-24 (×4): qty 1

## 2019-01-24 MED ORDER — MORPHINE SULFATE (PF) 4 MG/ML IV SOLN
4.0000 mg | INTRAVENOUS | Status: DC | PRN
  Administered 2019-01-24 (×2): 4 mg via INTRAVENOUS
  Filled 2019-01-24 (×2): qty 1

## 2019-01-24 MED ORDER — SODIUM CHLORIDE 0.9 % IV SOLN: INTRAVENOUS | Status: AC

## 2019-01-24 MED ORDER — HEPARIN (PORCINE) 25000 UT/250ML-% IV SOLN
750.0000 [IU]/h | INTRAVENOUS | Status: DC
  Administered 2019-01-24: 14:00:00 600 [IU]/h via INTRAVENOUS
  Filled 2019-01-24: qty 250

## 2019-01-24 MED ORDER — HEPARIN BOLUS VIA INFUSION
800.0000 [IU] | Freq: Once | INTRAVENOUS | Status: AC
  Administered 2019-01-24: 23:00:00 800 [IU] via INTRAVENOUS
  Filled 2019-01-24: qty 800

## 2019-01-24 MED ORDER — CEFAZOLIN SODIUM-DEXTROSE 2-4 GM/100ML-% IV SOLN
2.0000 g | INTRAVENOUS | Status: DC
  Filled 2019-01-24: qty 100

## 2019-01-24 MED ORDER — ONDANSETRON HCL 4 MG/2ML IJ SOLN
4.0000 mg | Freq: Once | INTRAMUSCULAR | Status: AC
  Administered 2019-01-24: 13:00:00 4 mg via INTRAVENOUS

## 2019-01-24 MED ORDER — SODIUM BICARBONATE 8.4 % IV SOLN
INTRAVENOUS | Status: DC
  Administered 2019-01-24 – 2019-01-25 (×2): via INTRAVENOUS
  Filled 2019-01-24 (×4): qty 100

## 2019-01-24 MED ORDER — IOHEXOL 350 MG/ML SOLN
100.0000 mL | Freq: Once | INTRAVENOUS | Status: AC | PRN
  Administered 2019-01-24: 100 mL via INTRAVENOUS

## 2019-01-24 MED ORDER — SODIUM CHLORIDE 0.9 % IV BOLUS
250.0000 mL | Freq: Once | INTRAVENOUS | Status: AC
  Administered 2019-01-24: 250 mL via INTRAVENOUS

## 2019-01-24 MED ORDER — CARVEDILOL 25 MG PO TABS
25.0000 mg | ORAL_TABLET | Freq: Two times a day (BID) | ORAL | Status: DC
  Administered 2019-01-24 – 2019-01-27 (×6): 25 mg via ORAL
  Filled 2019-01-24 (×6): qty 1

## 2019-01-24 MED ORDER — ONDANSETRON HCL 4 MG/2ML IJ SOLN
INTRAMUSCULAR | Status: AC
  Administered 2019-01-24: 13:00:00 4 mg via INTRAVENOUS
  Filled 2019-01-24: qty 2

## 2019-01-24 MED ORDER — HEPARIN BOLUS VIA INFUSION
3000.0000 [IU] | Freq: Once | INTRAVENOUS | Status: AC
  Administered 2019-01-24: 3000 [IU] via INTRAVENOUS
  Filled 2019-01-24: qty 3000

## 2019-01-24 MED ORDER — DOCUSATE SODIUM 100 MG PO CAPS: 100.0000 mg | ORAL_CAPSULE | Freq: Two times a day (BID) | ORAL | Status: DC | PRN

## 2019-01-24 MED ORDER — AMLODIPINE BESYLATE 10 MG PO TABS
10.0000 mg | ORAL_TABLET | Freq: Every day | ORAL | Status: DC
  Administered 2019-01-25 – 2019-01-27 (×3): 10 mg via ORAL
  Filled 2019-01-24: qty 2
  Filled 2019-01-24 (×3): qty 1

## 2019-01-24 MED ORDER — OXYCODONE-ACETAMINOPHEN 5-325 MG PO TABS
1.0000 | ORAL_TABLET | ORAL | Status: DC | PRN
  Administered 2019-01-24 – 2019-01-26 (×4): 1 via ORAL
  Filled 2019-01-24 (×4): qty 1

## 2019-01-24 NOTE — ED Provider Notes (Addendum)
[facility] Emergency Department Provider Note    First MD Initiated Contact with Patient 01/24/19 1007     (approximate)  I have reviewed the triage vital signs and the nursing notes.   HISTORY  Chief Complaint Leg Pain    HPI Rebecca Bridges is a 43 y.o. female below listed past medical history as well as a history of smoking presents to the ER for evaluation of sudden onset of worsening left foot pain coldness and achiness.  States that she has had similar issues in the past but was generally frequent in nature this is the most severe enlarged induration.  Denies any chest pain.  States she been compliant with her medications but is not on anticoagulation.  Denies any nausea or vomiting.  No recent fevers.    Past Medical History:  Diagnosis Date   CHF (congestive heart failure) (HCC)    Chronic kidney disease    Hypertension    History reviewed. No pertinent family history. 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 Authorizing Provider  amLODipine (NORVASC) 10 MG tablet Take 1 tablet (10 mg total) by mouth daily. 01/08/19  Yes Triplett, Cari B, FNP  carvedilol (COREG) 25 MG tablet Take 1 tablet (25 mg total) by mouth 2 (two) times daily with a meal. 05/05/18  Yes Lisa Roca, MD  hydrochlorothiazide (HYDRODIURIL) 12.5 MG tablet Take 1 tablet (12.5 mg total) by mouth daily. 01/08/19  Yes Triplett, Cari B, FNP  lisinopril (ZESTRIL) 40 MG tablet Take 1 tablet (40 mg total) by mouth daily. 01/08/19  Yes Triplett, Cari B, FNP    Allergies Sulfa antibiotics    Social History Social History   Tobacco Use   Smoking status: Current Every Day Smoker   Smokeless tobacco: Never Used  Substance Use Topics   Alcohol use: No    Frequency:  Never   Drug use: No    Review of Systems Patient denies headaches, rhinorrhea, blurry vision, numbness, shortness of breath, chest pain, edema, cough, abdominal pain, nausea, vomiting, diarrhea, dysuria, fevers, rashes or hallucinations unless otherwise stated above in HPI. ____________________________________________   PHYSICAL EXAM:  VITAL SIGNS: Vitals:   01/24/19 1330 01/24/19 1400  BP: (!) 160/107 (!) 149/115  Pulse: 79 89  Resp: (!) 26 (!) 24  Temp:    SpO2: 100% 99%    Constitutional: Alert and oriented.  Eyes: Conjunctivae are normal.  Head: Atraumatic. Nose: No congestion/rhinnorhea. Mouth/Throat: Mucous membranes are moist.   Neck: No stridor. Painless ROM.  Cardiovascular: Normal rate, regular rhythm. Grossly normal heart sounds.  Good peripheral circulation. Respiratory: Normal respiratory effort.  No retractions. Lungs CTAB. Gastrointestinal: Soft and nontender. No distention. No abdominal bruits. No CVA tenderness. Genitourinary: deferred Musculoskeletal: Both feet are slightly cool to touch with delayed cap refill greater than 3 seconds.  Does have some erythematous changes to the toes of the left leg.  Unable to palpate DP pulse of the left foot.  Absent DP Doppler signal but there is triphasic PT.  Palpable popliteal bilaterally.  Calf compartments are soft. Neurologic:  Normal speech and language. No gross focal neurologic deficits are appreciated. No facial droop Skin:  Skin is warm, dry and intact. No rash noted. Psychiatric: Mood and affect are normal. Speech and  behavior are normal.  ____________________________________________   LABS (all labs ordered are listed, but only abnormal results are displayed)  Results for orders placed or performed during the hospital encounter of 01/24/19 (from the past 24 hour(s))  CBC with Differential     Status: Abnormal   Collection Time: 01/24/19  9:12 AM  Result Value Ref Range   WBC 4.4 4.0 - 10.5 K/uL   RBC  3.84 (L) 3.87 - 5.11 MIL/uL   Hemoglobin 12.8 12.0 - 15.0 g/dL   HCT 39.0 36.0 - 46.0 %   MCV 101.6 (H) 80.0 - 100.0 fL   MCH 33.3 26.0 - 34.0 pg   MCHC 32.8 30.0 - 36.0 g/dL   RDW 13.4 11.5 - 15.5 %   Platelets 108 (L) 150 - 400 K/uL   nRBC 0.0 0.0 - 0.2 %   Neutrophils Relative % 77 %   Neutro Abs 3.4 1.7 - 7.7 K/uL   Lymphocytes Relative 13 %   Lymphs Abs 0.6 (L) 0.7 - 4.0 K/uL   Monocytes Relative 7 %   Monocytes Absolute 0.3 0.1 - 1.0 K/uL   Eosinophils Relative 2 %   Eosinophils Absolute 0.1 0.0 - 0.5 K/uL   Basophils Relative 1 %   Basophils Absolute 0.0 0.0 - 0.1 K/uL   Immature Granulocytes 0 %   Abs Immature Granulocytes 0.01 0.00 - 0.07 K/uL  Comprehensive metabolic panel     Status: Abnormal   Collection Time: 01/24/19  9:12 AM  Result Value Ref Range   Sodium 129 (L) 135 - 145 mmol/L   Potassium 4.4 3.5 - 5.1 mmol/L   Chloride 103 98 - 111 mmol/L   CO2 21 (L) 22 - 32 mmol/L   Glucose, Bld 94 70 - 99 mg/dL   BUN 43 (H) 6 - 20 mg/dL   Creatinine, Ser 2.22 (H) 0.44 - 1.00 mg/dL   Calcium 8.2 (L) 8.9 - 10.3 mg/dL   Total Protein 9.8 (H) 6.5 - 8.1 g/dL   Albumin 3.4 (L) 3.5 - 5.0 g/dL   AST 35 15 - 41 U/L   ALT 19 0 - 44 U/L   Alkaline Phosphatase 63 38 - 126 U/L   Total Bilirubin 0.6 0.3 - 1.2 mg/dL   GFR calc non Af Amer 26 (L) >60 mL/min   GFR calc Af Amer 31 (L) >60 mL/min   Anion gap 5 5 - 15  Brain natriuretic peptide     Status: Abnormal   Collection Time: 01/24/19 11:23 AM  Result Value Ref Range   B Natriuretic Peptide 854.0 (H) 0.0 - 100.0 pg/mL  APTT     Status: None   Collection Time: 01/24/19 12:58 PM  Result Value Ref Range   aPTT 36 24 - 36 seconds  Protime-INR     Status: None   Collection Time: 01/24/19 12:58 PM  Result Value Ref Range   Prothrombin Time 13.9 11.4 - 15.2 seconds   INR 1.1 0.8 - 1.2  Pregnancy, urine POC     Status: None   Collection Time: 01/24/19  2:26 PM  Result Value Ref Range   Preg Test, Ur NEGATIVE NEGATIVE    ____________________________________________  EKG My review and personal interpretation at Time: 10:39   Indication: leg pain  Rate: 90  Rhythm: sinus Axis: left Other: lbbb, st and t wave abn in V6 otherwise no significant changes since previous ____________________________________________  RADIOLOGY  I personally reviewed all radiographic images ordered to evaluate for the above acute complaints  and reviewed radiology reports and findings.  These findings were personally discussed with the patient.  Please see medical record for radiology report.  ____________________________________________   PROCEDURES  Procedure(s) performed:  Procedures    Critical Care performed: no ____________________________________________   INITIAL IMPRESSION / ASSESSMENT AND PLAN / ED COURSE  Pertinent labs & imaging results that were available during my care of the patient were reviewed by me and considered in my medical decision making (see chart for details).   DDX: limb ischemia, cellulitis, chf, aki, electrolyte abn, dissection  Rebecca Bridges is a 43 y.o. who presents to the ED with worsening rest pain with evidence of decreased perfusion in the left lower extremity.  Does not seem to have critical limb ischemia at this point as she does have present PT Doppler signal but absent DP and given her rest pain certainly concerning for PAD.  Also concerning for possible thromboembolic process given her history of CHF.  Not currently on any anticoagulation.  She is in a sinus rhythm.  Denies any chest pain or pressure at this time.  Does have evidence of AKI which may be resulting in decreased perfusion distally.  Nevertheless given her comorbidities and acute worsening of pain do feel she would benefit from hospitalization for heparinization IV fluids and consultation.  Clinical Course as of Jan 23 1506  Tue Jan 24, 2019  1351 Patient now complaining of worsening back pain.  Will give additional pain  medication.  She is having new onset back pain with hypertension and left leg ischemia will order CT angiogram to evaluate for dissection.  Given patient's renal dysfunction discussed the risk and benefits and I do believe that we need to emergently exclude dissection.  Bedside ultrasound does not show any evidence of AAA.   [PR]  [id number] Fortunately do not see any evidence of aneurysm or dissection on CTA.  Patient will be appropriate for admission to hospitalist for further medical management.   [PR]    Clinical Course User Index [PR] Merlyn Lot, MD    The patient was evaluated in Emergency Department today for the symptoms described in the history of present illness. He/she was evaluated in the context of the global COVID-19 pandemic, which necessitated consideration that the patient might be at risk for infection with the SARS-CoV-2 virus that causes COVID-19. Institutional protocols and algorithms that pertain to the evaluation of patients at risk for COVID-19 are in a state of rapid change based on information released by regulatory bodies including the CDC and federal and state organizations. These policies and algorithms were followed during the patient's care in the ED.  As part of my medical decision making, I reviewed the following data within the [place] notes reviewed and incorporated, Labs reviewed, notes from prior ED visits and [place] Controlled Substance Database   ____________________________________________   FINAL CLINICAL IMPRESSION(S) / ED DIAGNOSES  Final diagnoses:  Left leg pain      NEW MEDICATIONS STARTED DURING THIS VISIT:  New Prescriptions   No medications on file     Note:  This document was prepared using Dragon voice recognition software and may include unintentional dictation errors.    Merlyn Lot, MD 01/24/19 1304    Merlyn Lot, MD 01/24/19 [phone]

## 2019-01-24 NOTE — ED Notes (Signed)
Spoke with Dr. Corky Downs regarding patient care, see orders.

## 2019-01-24 NOTE — ED Notes (Signed)
To pt's room to start 2nd IV and begin heparin drip.  Pt c/o severe back pain that hurts "all over"  Pt is unable to get comfortable in bed and is rolling from side to side.  Hospitalist in room to evaluate for admission and aware of same.  Discussed pt condition with Dr. Quentin Cornwall, verbal order to repeat prn Morphine and will CT to evaluate for dissection.

## 2019-01-24 NOTE — Progress Notes (Signed)
[place] for heparin Indication: limb ischemia  Allergies  Allergen Reactions  . Sulfa Antibiotics Rash    Patient Measurements: Height: 5\' 3"  (160 cm) Weight: 115 lb (52.2 kg) IBW/kg (Calculated) : 52.4 Heparin Dosing Weight: 52.2 kg  Vital Signs: Temp: 98 F (36.7 C) (08/11 2034) Temp Source: Oral (08/11 2034) BP: 114/88 (08/11 2034) Pulse Rate: 80 (08/11 2034)  Labs: Recent Labs    01/24/19 0912 01/24/19 1258 01/24/19 2209  HGB 12.8  --   --   HCT 39.0  --   --   PLT 108*  --   --   APTT  --  36  --   LABPROT  --  13.9  --   INR  --  1.1  --   HEPARINUNFRC  --   --  0.21*  CREATININE 2.22*  --   --     Estimated Creatinine Clearance: 27.2 mL/min (A) (by C-G formula based on SCr of 2.22 mg/dL (H)).   Medical History: Past Medical History:  Diagnosis Date  . CHF (congestive heart failure) ([place])   . Chronic kidney disease   . Hypertension     Assessment: 43 year old female presented with leg numbness, feet cold to touch. No anticoagulation listed on home meds. Pharmacy consulted to dose heparin for limb ischemia.  Goal of Therapy:  Heparin level 0.3-0.7 units/ml Monitor platelets by anticoagulation protocol: Yes   Plan:  08/11 @ 2300 HL 0.21 subtherapeutic. Will rebolus w/ heparin 800 units IV x 1 and increase rate to 750 units/hr and will recheck HL w/ am labs, baseline labs were WNL, will recheck CBC w/ am labs.  Pharmacy to follow and adjust as indicated.  Tobie Lords, PharmD Clinical Pharmacist 01/24/2019,11:00 PM

## 2019-01-24 NOTE — Progress Notes (Signed)
[place] for heparin Indication: limb ischemia  Allergies  Allergen Reactions  . Sulfa Antibiotics     Patient Measurements: Height: 5\' 3"  (160 cm) Weight: 115 lb (52.2 kg) IBW/kg (Calculated) : 52.4 Heparin Dosing Weight: 52.2 kg  Vital Signs: Temp: 98.9 F (37.2 C) (08/11 0859) Temp Source: Oral (08/11 0859) BP: 133/97 (08/11 1113) Pulse Rate: 87 (08/11 1113)  Labs: Recent Labs    01/24/19 0912  HGB 12.8  HCT 39.0  PLT 108*  CREATININE 2.22*    Estimated Creatinine Clearance: 27.2 mL/min (A) (by C-G formula based on SCr of 2.22 mg/dL (H)).   Medical History: Past Medical History:  Diagnosis Date  . CHF (congestive heart failure) ([place])   . Hypertension     Assessment: 43 year old female presented with leg numbness, feet cold to touch. No anticoagulation listed on home meds. Pharmacy consulted to dose heparin for limb ischemia.  Goal of Therapy:  Heparin level 0.3-0.7 units/ml Monitor platelets by anticoagulation protocol: Yes   Plan:  Baseline labs ordered. Will give heparin 3000 unit bolus followed by heparin drip at 600 units/hr. HL ordered for 2200. Will monitor CBC closely as platelets 108, Hg normal. CBC with morning labs.  Pharmacy to follow and adjust as indicated.  Tawnya Crook, PharmD Clinical Pharmacist 01/24/2019,12:53 PM

## 2019-01-24 NOTE — ED Notes (Signed)
Attempted to call report

## 2019-01-24 NOTE — ED Triage Notes (Addendum)
Pt presents to ED via POV with c/o leg numbness, pt states L leg cold to touch and numbness to her toes. +pedal pulses, +sensation, +movement on assessment. Pt ambulatory without difficulty at this time. Bilateral feet noted to be cool to the touch, bilateral legs noted to be warm to palpation.

## 2019-01-24 NOTE — ED Notes (Signed)
ED TO INPATIENT HANDOFF REPORT  ED Nurse Name and Phone #: Damen [place] [phone]  S Name/Age/Gender Rebecca Bridges 43 y.o. female Room/Bed: ED09A/ED09A  Code Status   Code Status: Prior  Home/SNF/Other Home Patient oriented to: self, place, time and situation Is this baseline? Yes   Triage Complete: Triage complete  Chief Complaint left leg numbness;pain  Triage Note Pt presents to ED via POV with c/o leg numbness, pt states L leg cold to touch and numbness to her toes. +pedal pulses, +sensation, +movement on assessment. Pt ambulatory without difficulty at this time. Bilateral feet noted to be cool to the touch, bilateral legs noted to be warm to palpation.    Allergies Allergies  Allergen Reactions  . Sulfa Antibiotics Rash    Level of Care/Admitting Diagnosis ED Disposition    ED Disposition Condition [facility] Area: [place] [100120]  Level of Care: Med-Surg [16]  Covid Evaluation: Asymptomatic Screening Protocol (No Symptoms)  Diagnosis: Left leg pain [321812]  Admitting Physician: Vaughan Basta [id number]  Attending Physician: Vaughan Basta [id number]  Estimated length of stay: past midnight tomorrow  Certification:: I certify this patient will need inpatient services for at least 2 midnights  PT Class (Do Not Modify): Inpatient [101]  PT Acc Code (Do Not Modify): Private [1]       B Medical/Surgery History Past Medical History:  Diagnosis Date  . CHF (congestive heart failure) ([place])   . Chronic kidney disease   . Hypertension    Past Surgical History:  Procedure Laterality Date  . CHOLECYSTECTOMY       A IV Location/Drains/Wounds Patient Lines/Drains/Airways Status   Active Line/Drains/Airways    Name:   Placement date:   Placement time:   Site:   Days:   Peripheral IV 01/24/19 Right Forearm   01/24/19    1124    Forearm   less than 1   Peripheral IV 01/24/19 Wrist   01/24/19    1343    Wrist   less than  1          Intake/Output Last 24 hours No intake or output data in the 24 hours ending 01/24/19 1551  Labs/Imaging Results for orders placed or performed during the hospital encounter of 01/24/19 (from the past 48 hour(s))  CBC with Differential     Status: Abnormal   Collection Time: 01/24/19  9:12 AM  Result Value Ref Range   WBC 4.4 4.0 - 10.5 K/uL   RBC 3.84 (L) 3.87 - 5.11 MIL/uL   Hemoglobin 12.8 12.0 - 15.0 g/dL   HCT 39.0 36.0 - 46.0 %   MCV 101.6 (H) 80.0 - 100.0 fL   MCH 33.3 26.0 - 34.0 pg   MCHC 32.8 30.0 - 36.0 g/dL   RDW 13.4 11.5 - 15.5 %   Platelets 108 (L) 150 - 400 K/uL    Comment: Immature Platelet Fraction may be clinically indicated, consider ordering this additional test [id number]    nRBC 0.0 0.0 - 0.2 %   Neutrophils Relative % 77 %   Neutro Abs 3.4 1.7 - 7.7 K/uL   Lymphocytes Relative 13 %   Lymphs Abs 0.6 (L) 0.7 - 4.0 K/uL   Monocytes Relative 7 %   Monocytes Absolute 0.3 0.1 - 1.0 K/uL   Eosinophils Relative 2 %   Eosinophils Absolute 0.1 0.0 - 0.5 K/uL   Basophils Relative 1 %   Basophils Absolute 0.0 0.0 - 0.1 K/uL   Immature Granulocytes  0 %   Abs Immature Granulocytes 0.01 0.00 - 0.07 K/uL    Comment: Performed at [facility], [address] [place], [place] [address]  Comprehensive metabolic panel     Status: Abnormal   Collection Time: 01/24/19  9:12 AM  Result Value Ref Range   Sodium 129 (L) 135 - 145 mmol/L   Potassium 4.4 3.5 - 5.1 mmol/L   Chloride 103 98 - 111 mmol/L   CO2 21 (L) 22 - 32 mmol/L   Glucose, Bld 94 70 - 99 mg/dL   BUN 43 (H) 6 - 20 mg/dL   Creatinine, Ser 2.22 (H) 0.44 - 1.00 mg/dL   Calcium 8.2 (L) 8.9 - 10.3 mg/dL   Total Protein 9.8 (H) 6.5 - 8.1 g/dL   Albumin 3.4 (L) 3.5 - 5.0 g/dL   AST 35 15 - 41 U/L   ALT 19 0 - 44 U/L   Alkaline Phosphatase 63 38 - 126 U/L   Total Bilirubin 0.6 0.3 - 1.2 mg/dL   GFR calc non Af Amer 26 (L) >60 mL/min   GFR calc Af Amer 31 (L) >60 mL/min   Anion gap 5  5 - 15    Comment: Performed at [facility], [address] [place], [place] [address]  Brain natriuretic peptide     Status: Abnormal   Collection Time: 01/24/19 11:23 AM  Result Value Ref Range   B Natriuretic Peptide 854.0 (H) 0.0 - 100.0 pg/mL    Comment: Performed at [facility], [address] [place], [place] [address]  APTT     Status: None   Collection Time: 01/24/19 12:58 PM  Result Value Ref Range   aPTT 36 24 - 36 seconds    Comment: Performed at [facility], [address] [place], [place] [address]  Protime-INR     Status: None   Collection Time: 01/24/19 12:58 PM  Result Value Ref Range   Prothrombin Time 13.9 11.4 - 15.2 seconds   INR 1.1 0.8 - 1.2    Comment: (NOTE) INR goal varies based on device and disease states. Performed at [facility], [address] [place], [place] [address]   Pregnancy, urine POC     Status: None   Collection Time: 01/24/19  2:26 PM  Result Value Ref Range   Preg Test, Ur NEGATIVE NEGATIVE    Comment:        THE SENSITIVITY OF THIS METHODOLOGY IS >24 mIU/mL    US Venous Img Lower Unilateral Left  Result Date: 01/24/2019 CLINICAL DATA:  Pain EXAM: LEFT LOWER EXTREMITY VENOUS DOPPLER ULTRASOUND TECHNIQUE: Gray-scale sonography with compression, as well as color and duplex ultrasound, were performed to evaluate the deep venous system from the level of the common femoral vein through the popliteal and proximal calf veins. COMPARISON:  None FINDINGS: Normal compressibility of the common femoral, superficial femoral, and popliteal veins, as well as the proximal calf veins. No filling defects to suggest DVT on grayscale or color Doppler imaging. Doppler waveforms show normal direction of venous flow, normal respiratory phasicity and response to augmentation. Survey views of the contralateral common femoral vein are unremarkable. IMPRESSION: No femoropopliteal and no calf DVT in the visualized calf  veins. If clinical symptoms are inconsistent or if there are persistent or worsening symptoms, further imaging (possibly involving the iliac veins) may be warranted. Electronically Signed   By: Lucrezia Europe M.D.   On: 01/24/2019 10:07   Dg Chest Portable 1  View  Result Date: 01/24/2019 CLINICAL DATA:  Congestive heart failure EXAM: PORTABLE CHEST 1 VIEW COMPARISON:  01/08/2019 FINDINGS: Cardiomegaly. Pulmonary vasculature is within normal limits. No focal consolidation. No pleural effusion. No pneumothorax. No acute osseous findings. IMPRESSION: Cardiomegaly.  Lungs are clear. Electronically Signed   By: Davina Poke M.D.   On: 01/24/2019 11:04    Pending Labs Unresulted Labs (From admission, onward)    Start     Ordered   01/25/19 0500  CBC  Tomorrow morning,   STAT     01/24/19 1301   01/24/19 2200  Heparin level (unfractionated)  Once-Timed,   STAT     01/24/19 1301   01/24/19 1050  SARS CORONAVIRUS 2 Nasal Swab Aptima Multi Swab  (Asymptomatic/Tier 2 Patients Labs)  ONCE - STAT,   STAT    Question Answer Comment  Is this test for diagnosis or screening Screening   Symptomatic for COVID-19 as defined by CDC No   Hospitalized for COVID-19 No   Admitted to ICU for COVID-19 No   Previously tested for COVID-19 Yes   Resident in a congregate (group) care setting No   Employed in healthcare setting No   Pregnant No      01/24/19 1050   Signed and Held  HIV antibody (Routine Testing)  Once,   R     Signed and Held   Signed and Held  Basic metabolic panel  Tomorrow morning,   R     Signed and Held   Signed and Held  CBC  Tomorrow morning,   R     Signed and Held          Vitals/Pain Today's Vitals   01/24/19 1300 01/24/19 1330 01/24/19 1330 01/24/19 1400  BP: (!) 143/106 (!) 160/107  (!) 149/115  Pulse:  79  89  Resp: 15 (!) 26  (!) 24  Temp:      TempSrc:      SpO2:  100%  99%  Weight:      Height:      PainSc:   4      Isolation Precautions No active  isolations  Medications Medications  heparin ADULT infusion 100 units/mL (25000 units/227mL sodium chloride 0.45%) (600 Units/hr Intravenous New Bag/Given 01/24/19 1353)  0.9 %  sodium chloride infusion (has no administration in time range)  morphine 2 MG/ML injection 2 mg (has no administration in time range)  oxyCODONE-acetaminophen (PERCOCET/ROXICET) 5-325 MG per tablet 1 tablet (has no administration in time range)  nicotine (NICODERM CQ - dosed in mg/24 hours) patch 21 mg (has no administration in time range)  sodium bicarbonate 100 mEq in dextrose 5 % 1,000 mL infusion (has no administration in time range)  heparin bolus via infusion 3,000 Units (3,000 Units Intravenous Bolus from Bag 01/24/19 1354)  sodium chloride 0.9 % bolus 250 mL (250 mLs Intravenous New Bag/Given 01/24/19 1351)  ondansetron (ZOFRAN) injection 4 mg (4 mg Intravenous Given 01/24/19 1320)  iohexol (OMNIPAQUE) 350 MG/ML injection 100 mL (100 mLs Intravenous Contrast Given 01/24/19 1434)    Mobility walks with person assist Low fall risk   Focused Assessments no dorsal pedal pulse palpable   R Recommendations: See Admitting Provider Note  Report given to:   Additional Notes:

## 2019-01-24 NOTE — H&P (Signed)
[facility] [place] [facility] NAME: Rebecca Bridges    MR#:  [id number]  DATE OF BIRTH:  1975-08-14  DATE OF ADMISSION:  01/24/2019  PRIMARY CARE PHYSICIAN: Patient, No Pcp Per   REQUESTING/REFERRING PHYSICIAN: Quentin Cornwall  CHIEF COMPLAINT:   Chief Complaint  Patient presents with  . Leg Pain    HISTORY OF PRESENT ILLNESS: Rebecca Bridges  is a 43 y.o. female with a known history of CHF, chronic kidney disease stage III, hypertension-started having left leg pain suddenly today and concern with this she came to emergency room.  She denies any injuries to her leg.  She denies any associated fever chills cough or shortness of breath. In the ER they found her left leg to be slightly cold so ultrasound was ordered which did not show any DVT. ER physician suggested to admit to hospitalist service after starting on heparin drip for suspected vascular issues.  They also noted her renal function was slightly worse than her baseline. Meanwhile she had received 1 injection for her pain by ER and when I went to see her she was complaining of severe back pain all over her back, she was not able to stay steady in her bed with the pain and constantly moving.  I spoke to ER physician about this and we agreed to get a CT angiogram to rule out aortic aneurysm rupture.  She received the CT scan and that was reported negative as per ER physician and then he suggested to admit to hospitalist service to monitor her. By the time I saw her again, her back pain was resolved.  Her leg pain was much better.  PAST MEDICAL HISTORY:   Past Medical History:  Diagnosis Date  . CHF (congestive heart failure) ([place])   . Chronic kidney disease   . Hypertension     PAST SURGICAL HISTORY:  Past Surgical History:  Procedure Laterality Date  . CHOLECYSTECTOMY      SOCIAL HISTORY:  Social History   Tobacco Use  . Smoking status: Current Every Day Smoker  . Smokeless tobacco: Never Used   Substance Use Topics  . Alcohol use: No    Frequency: Never    FAMILY HISTORY:  Family History  Problem Relation Age of Onset  . Hypertension Mother     DRUG ALLERGIES:  Allergies  Allergen Reactions  . Sulfa Antibiotics Rash    REVIEW OF SYSTEMS:   CONSTITUTIONAL: No fever, fatigue or weakness.  EYES: No blurred or double vision.  EARS, NOSE, AND THROAT: No tinnitus or ear pain.  RESPIRATORY: No cough, shortness of breath, wheezing or hemoptysis.  CARDIOVASCULAR: No chest pain, orthopnea, edema.  GASTROINTESTINAL: No nausea, vomiting, diarrhea or abdominal pain.  GENITOURINARY: No dysuria, hematuria.  ENDOCRINE: No polyuria, nocturia,  HEMATOLOGY: No anemia, easy bruising or bleeding SKIN: No rash or lesion. MUSCULOSKELETAL: No joint pain or arthritis.   NEUROLOGIC: No tingling, numbness, weakness.  PSYCHIATRY: No anxiety or depression.   MEDICATIONS AT HOME:  Prior to Admission medications   Medication Sig Start Date End Date Taking? Authorizing Provider  amLODipine (NORVASC) 10 MG tablet Take 1 tablet (10 mg total) by mouth daily. 01/08/19  Yes Triplett, Cari B, FNP  carvedilol (COREG) 25 MG tablet Take 1 tablet (25 mg total) by mouth 2 (two) times daily with a meal. 05/05/18  Yes Lisa Roca, MD  hydrochlorothiazide (HYDRODIURIL) 12.5 MG tablet Take 1 tablet (12.5 mg total) by mouth daily. 01/08/19  Yes Triplett, Johnette Abraham  B, FNP  lisinopril (ZESTRIL) 40 MG tablet Take 1 tablet (40 mg total) by mouth daily. 01/08/19  Yes Triplett, Cari B, FNP      PHYSICAL EXAMINATION:   VITAL SIGNS: Blood pressure (!) 149/115, pulse 89, temperature 98.9 F (37.2 C), temperature source Oral, resp. rate (!) 24, height 5\' 3"  (1.6 m), weight 52.2 kg, last menstrual period 01/14/2019, SpO2 99 %.  GENERAL:  43 y.o.-year-old patient lying in the bed with no acute distress.  EYES: Pupils equal, round, reactive to light and accommodation. No scleral icterus. Extraocular muscles intact.   HEENT: Head atraumatic, normocephalic. Oropharynx and nasopharynx clear.  NECK:  Supple, no jugular venous distention. No thyroid enlargement, no tenderness.  LUNGS: Normal breath sounds bilaterally, no wheezing, rales,rhonchi or crepitation. No use of accessory muscles of respiration.  CARDIOVASCULAR: S1, S2 normal. No murmurs, rubs, or gallops.  ABDOMEN: Soft, nontender, nondistended. Bowel sounds present. No organomegaly or mass.  EXTREMITIES: No pedal edema, cyanosis, or clubbing.  Left leg is slightly cool to touch and pulses are weak. NEUROLOGIC: Cranial nerves II through XII are intact. Muscle strength 5/5 in all extremities. Sensation intact. Gait not checked.  PSYCHIATRIC: The patient is alert and oriented x 3.  SKIN: No obvious rash, lesion, or ulcer.   LABORATORY PANEL:   CBC Recent Labs  Lab 01/24/19 0912  WBC 4.4  HGB 12.8  HCT 39.0  PLT 108*  MCV 101.6*  MCH 33.3  MCHC 32.8  RDW 13.4  LYMPHSABS 0.6*  MONOABS 0.3  EOSABS 0.1  BASOSABS 0.0   ------------------------------------------------------------------------------------------------------------------  Chemistries  Recent Labs  Lab 01/24/19 0912  NA 129*  K 4.4  CL 103  CO2 21*  GLUCOSE 94  BUN 43*  CREATININE 2.22*  CALCIUM 8.2*  AST 35  ALT 19  ALKPHOS 63  BILITOT 0.6   ------------------------------------------------------------------------------------------------------------------ estimated creatinine clearance is 27.2 mL/min (A) (by C-G formula based on SCr of 2.22 mg/dL (H)). ------------------------------------------------------------------------------------------------------------------ No results for input(s): TSH, T4TOTAL, T3FREE, THYROIDAB in the last 72 hours.  Invalid input(s): FREET3   Coagulation profile Recent Labs  Lab 01/24/19 1258  INR 1.1   ------------------------------------------------------------------------------------------------------------------- No results  for input(s): DDIMER in the last 72 hours. -------------------------------------------------------------------------------------------------------------------  Cardiac Enzymes No results for input(s): CKMB, TROPONINI, MYOGLOBIN in the last 168 hours.  Invalid input(s): CK ------------------------------------------------------------------------------------------------------------------ Invalid input(s): POCBNP  ---------------------------------------------------------------------------------------------------------------  Urinalysis    Component Value Date/Time   COLORURINE YELLOW (A) 11/21/2018 1722   APPEARANCEUR CLOUDY (A) 11/21/2018 1722   LABSPEC 1.014 11/21/2018 1722   PHURINE 6.0 11/21/2018 1722   GLUCOSEU NEGATIVE 11/21/2018 1722   HGBUR LARGE (A) 11/21/2018 1722   BILIRUBINUR NEGATIVE 11/21/2018 1722   KETONESUR NEGATIVE 11/21/2018 1722   PROTEINUR 100 (A) 11/21/2018 1722   NITRITE NEGATIVE 11/21/2018 1722   LEUKOCYTESUR NEGATIVE 11/21/2018 1722     RADIOLOGY: US Venous Img Lower Unilateral Left  Result Date: 01/24/2019 CLINICAL DATA:  Pain EXAM: LEFT LOWER EXTREMITY VENOUS DOPPLER ULTRASOUND TECHNIQUE: Gray-scale sonography with compression, as well as color and duplex ultrasound, were performed to evaluate the deep venous system from the level of the common femoral vein through the popliteal and proximal calf veins. COMPARISON:  None FINDINGS: Normal compressibility of the common femoral, superficial femoral, and popliteal veins, as well as the proximal calf veins. No filling defects to suggest DVT on grayscale or color Doppler imaging. Doppler waveforms show normal direction of venous flow, normal respiratory phasicity and response to augmentation. Survey views of the contralateral common  femoral vein are unremarkable. IMPRESSION: No femoropopliteal and no calf DVT in the visualized calf veins. If clinical symptoms are inconsistent or if there are persistent or worsening  symptoms, further imaging (possibly involving the iliac veins) may be warranted. Electronically Signed   By: Lucrezia Europe M.D.   On: 01/24/2019 10:07   Dg Chest Portable 1 View  Result Date: 01/24/2019 CLINICAL DATA:  Congestive heart failure EXAM: PORTABLE CHEST 1 VIEW COMPARISON:  01/08/2019 FINDINGS: Cardiomegaly. Pulmonary vasculature is within normal limits. No focal consolidation. No pleural effusion. No pneumothorax. No acute osseous findings. IMPRESSION: Cardiomegaly.  Lungs are clear. Electronically Signed   By: Davina Poke M.D.   On: 01/24/2019 11:04    EKG: Orders placed or performed during the hospital encounter of 01/24/19  . ED EKG  . ED EKG    IMPRESSION AND PLAN:  *Left leg pain Nontraumatic Suspected embolic event by ER physician. Started on heparin IV drip. I will give PRN pain medication for support. I have contacted vascular doctor for consult.  *Back pain She had this complaint immediately after receiving morphine injection. The pain was significant. CT scan angiogram was done which did not report any acute abnormalities like aneurysm or rupture as per ER physician. Official report is still awaited. Currently the pain is resolved.  *Acute on chronic renal failure Baseline creatinine appears to be around 1.5. CKD stage III As she also had CT angiogram, I would start on bicarb drip and IV fluid Nephrology consult. Hold hydrochlorothiazide and lisinopril. Monitor renal function and get ultrasound renal.  *Hypertension Continue amlodipine and carvedilol. Hold hydrochlorothiazide and lisinopril.  *Active smoking Counseled to quit smoking for 4 minutes and offered nicotine patch.    All the records are reviewed and case discussed with ED provider. Management plans discussed with the patient, family and they are in agreement.  CODE STATUS: Full code Code Status History    Date Active Date Inactive Code Status Order ID Comments User Context    08/07/2017 1529 08/09/2017 1854 Full Code [id number]  SalaryAvel Peace, MD Inpatient   Advance Care Planning Activity       TOTAL TIME TAKING CARE OF THIS PATIENT: 45 minutes.    Vaughan Basta M.D on 01/24/2019   Between 7am to 6pm - Pager - [phone]  After 6pm go to [email or web address] - password [email or web address] [facility]  Office  [phone]  CC: Primary care physician; Patient, No Pcp Per   Note: This dictation was prepared with Dragon dictation along with smaller phrase technology. Any transcriptional errors that result from this process are unintentional.

## 2019-01-24 NOTE — Progress Notes (Signed)
Family Meeting Note  Advance Directive:yes  Today a meeting took place with the Patient.   The following clinical team members were present during this meeting:MD  The following were discussed:Patient's diagnosis: Acute on chronic renal failure stage III, left leg pain, hypertension, active smoking, Patient's progosis: Unable to determine and Goals for treatment: Full Code  Patient states she would like her father to make her decisions in any adverse event. She would like to be a full code.  Additional follow-up to be provided: Vascular, nephrology  Time spent during discussion:20 minutes  Vaughan Basta, MD

## 2019-01-24 NOTE — Consult Note (Signed)
[facility] Vascular Consult Note  MRN : [id number]  Rebecca Bridges is a 43 y.o. ([birth year]) female who presents with chief complaint of  Chief Complaint  Patient presents with  . Leg Pain   History of Present Illness:  The patient is a 43 year old female with a past medical history of hypertension and acute on chronic renal failure who presented [facility]'s emergency department with a chief complaint of left lower extremity pain.  The patient endorses a history of her discomfort starting earlier this a.m.  The patient notes she has experienced this type of pain in the past however this morning it became progressively more painful which is prompted her to seek medical attention.  Once in the ED the patient began to experience severe back pain and subsequently underwent a CT angio of the chest, abdomen, and pelvis with runoff.   Patient denies any recent surgery or trauma to the legs.  Patient denies any recent immobility.  Patient denies any bleeding/clotting disorders.  She denies any fever, nausea vomiting.  [place] duplex negative for DVT to the bilateral legs CTA of the chest abdomen and pelvis without any vascular pathology. CTA with runoff shows   Right Lower Extremity: Anterior tibial, peroneal and posterior tibial patent  Left lower extremity: Anterior tibial and peroneal are patent with distal  occlusion of the posterior tibial.  The patient is doing much better now.  Patient was sleeping upon arrival to her room.  Seems as if her pain has resolved at this time.  Vascular surgery was consulted by Dr. Anselm Jungling for further recommendations Current Facility-Administered Medications  Medication Dose Route Frequency Provider Last Rate Last Dose  . 0.9 %  sodium chloride infusion   Intravenous Continuous Vaughan Basta, MD      . heparin ADULT infusion 100 units/mL (25000 units/264mL sodium chloride 0.45%)  600 Units/hr Intravenous  Continuous Tawnya Crook, RPH 6 mL/hr at 01/24/19 1353 600 Units/hr at 01/24/19 1353  . morphine 2 MG/ML injection 2 mg  2 mg Intravenous Q4H PRN Vaughan Basta, MD      . nicotine (NICODERM CQ - dosed in mg/24 hours) patch 21 mg  21 mg Transdermal Daily Vaughan Basta, MD      . oxyCODONE-acetaminophen (PERCOCET/ROXICET) 5-325 MG per tablet 1 tablet  1 tablet Oral Q4H PRN Vaughan Basta, MD      . sodium bicarbonate 100 mEq in dextrose 5 % 1,000 mL infusion   Intravenous Continuous Vaughan Basta, MD       Current Outpatient Medications  Medication Sig Dispense Refill  . amLODipine (NORVASC) 10 MG tablet Take 1 tablet (10 mg total) by mouth daily. 30 tablet 0  . carvedilol (COREG) 25 MG tablet Take 1 tablet (25 mg total) by mouth 2 (two) times daily with a meal. 60 tablet 1  . hydrochlorothiazide (HYDRODIURIL) 12.5 MG tablet Take 1 tablet (12.5 mg total) by mouth daily. 30 tablet 0  . lisinopril (ZESTRIL) 40 MG tablet Take 1 tablet (40 mg total) by mouth daily. 30 tablet 0   Past Medical History:  Diagnosis Date  . CHF (congestive heart failure) ([place])   . Chronic kidney disease   . Hypertension    Past Surgical History:  Procedure Laterality Date  . CHOLECYSTECTOMY     Social History Social History   Tobacco Use  . Smoking status: Current Every Day Smoker  . Smokeless tobacco: Never Used  Substance Use Topics  . Alcohol use: No  Frequency: Never  . Drug use: No   Family History Family History  Problem Relation Age of Onset  . Hypertension Mother   Denies any family history of peripheral artery disease, venous disease or renal disease.  Allergies  Allergen Reactions  . Sulfa Antibiotics Rash   REVIEW OF SYSTEMS (Negative unless checked)  Constitutional: [] Weight loss  [] Fever  [] Chills Cardiac: [] Chest pain   [] Chest pressure   [] Palpitations   [] Shortness of breath when laying flat   [] Shortness of breath at rest   [] Shortness of  breath with exertion. Vascular:  [x] Pain in legs with walking   [x] Pain in legs at rest   [x] Pain in legs when laying flat   [] Claudication   [] Pain in feet when walking  [] Pain in feet at rest  [] Pain in feet when laying flat   [] History of DVT   [] Phlebitis   [] Swelling in legs   [] Varicose veins   [] Non-healing ulcers Pulmonary:   [] Uses home oxygen   [] Productive cough   [] Hemoptysis   [] Wheeze  [] COPD   [] Asthma Neurologic:  [] Dizziness  [] Blackouts   [] Seizures   [] History of stroke   [] History of TIA  [] Aphasia   [] Temporary blindness   [] Dysphagia   [] Weakness or numbness in arms   [] Weakness or numbness in legs Musculoskeletal:  [] Arthritis   [] Joint swelling   [] Joint pain   [] Low back pain Hematologic:  [] Easy bruising  [] Easy bleeding   [] Hypercoagulable state   [] Anemic  [] Hepatitis Gastrointestinal:  [] Blood in stool   [] Vomiting blood  [] Gastroesophageal reflux/heartburn   [] Difficulty swallowing. Genitourinary:  [x] Chronic kidney disease   [] Difficult urination  [] Frequent urination  [] Burning with urination   [] Blood in urine Skin:  [] Rashes   [] Ulcers   [] Wounds Psychological:  [] History of anxiety   []  History of major depression.  Physical Examination  Vitals:   01/24/19 1230 01/24/19 1300 01/24/19 1330 01/24/19 1400  BP: (!) 149/107 (!) 143/106 (!) 160/107 (!) 149/115  Pulse: 88  79 89  Resp: 18 15 (!) 26 (!) 24  Temp:      TempSrc:      SpO2: 100%  100% 99%  Weight:      Height:       Body mass index is 20.37 kg/m. Gen:  WD/WN, NAD Head: [place]/AT, No temporalis wasting. Prominent temp pulse not noted. Ear/Nose/Throat: Hearing grossly intact, nares w/o erythema or drainage, oropharynx w/o Erythema/Exudate Eyes: Sclera non-icteric, conjunctiva clear Neck: Trachea midline.  No JVD.  Pulmonary:  Good air movement, respirations not labored, equal bilaterally.  Cardiac: RRR, normal S1, S2. Vascular:  Vessel Right Left  Radial Palpable Palpable  Ulnar Palpable Palpable   Brachial Palpable Palpable  Carotid Palpable, without bruit Palpable, without bruit  Aorta Not palpable N/A  Femoral Palpable Palpable  Popliteal Palpable Palpable  PT Palpable Faint  DP Palpable Faint   Left Lower Extremity: Thigh soft.  Calf soft.  Extremity is warm distally approximately to about mid calf and becomes cooler.  Faint pedal pulses.  Reddish tint to the tips of the toes.  Motor/sensory is intact.  Right lower extremity: Thigh soft.  Calf soft.  Extremities warm distally approximately at about mid calf and becomes cooler.  Palpable pedal pulses noted.  Motor/sensory is intact.  Gastrointestinal: soft, non-tender/non-distended. No guarding/reflex.  Musculoskeletal: M/S 5/5 throughout.  Extremities without ischemic changes.  No deformity or atrophy. Minimal edema. Neurologic: Sensation grossly intact in extremities.  Symmetrical.  Speech is fluent. Motor exam  as listed above. Psychiatric: Judgment intact, Mood & affect appropriate for pt's clinical situation. Dermatologic: No rashes or ulcers noted.  No cellulitis or open wounds. Lymph : No Cervical, Axillary, or Inguinal lymphadenopathy.  CBC Lab Results  Component Value Date   WBC 4.4 01/24/2019   HGB 12.8 01/24/2019   HCT 39.0 01/24/2019   MCV 101.6 (H) 01/24/2019   PLT 108 (L) 01/24/2019   BMET    Component Value Date/Time   NA 129 (L) 01/24/2019 0912   K 4.4 01/24/2019 0912   CL 103 01/24/2019 0912   CO2 21 (L) 01/24/2019 0912   GLUCOSE 94 01/24/2019 0912   BUN 43 (H) 01/24/2019 0912   CREATININE 2.22 (H) 01/24/2019 0912   CALCIUM 8.2 (L) 01/24/2019 0912   GFRNONAA 26 (L) 01/24/2019 0912   GFRAA 31 (L) 01/24/2019 0912   Estimated Creatinine Clearance: 27.2 mL/min (A) (by C-G formula based on SCr of 2.22 mg/dL (H)).  COAG Lab Results  Component Value Date   INR 1.1 01/24/2019   Radiology Dg Chest 2 View  Result Date: 01/08/2019 CLINICAL DATA:  History of CHF, shortness of breath EXAM: CHEST - 2  VIEW COMPARISON:  05/05/2018 FINDINGS: Cardiomegaly. Both lungs are clear. The visualized skeletal structures are unremarkable. IMPRESSION: Cardiomegaly without acute abnormality of the lungs. Electronically Signed   By: Eddie Candle M.D.   On: 01/08/2019 14:46   US Venous Img Lower Unilateral Left  Result Date: 01/24/2019 CLINICAL DATA:  Pain EXAM: LEFT LOWER EXTREMITY VENOUS DOPPLER ULTRASOUND TECHNIQUE: Gray-scale sonography with compression, as well as color and duplex ultrasound, were performed to evaluate the deep venous system from the level of the common femoral vein through the popliteal and proximal calf veins. COMPARISON:  None FINDINGS: Normal compressibility of the common femoral, superficial femoral, and popliteal veins, as well as the proximal calf veins. No filling defects to suggest DVT on grayscale or color Doppler imaging. Doppler waveforms show normal direction of venous flow, normal respiratory phasicity and response to augmentation. Survey views of the contralateral common femoral vein are unremarkable. IMPRESSION: No femoropopliteal and no calf DVT in the visualized calf veins. If clinical symptoms are inconsistent or if there are persistent or worsening symptoms, further imaging (possibly involving the iliac veins) may be warranted. Electronically Signed   By: Lucrezia Europe M.D.   On: 01/24/2019 10:07   Dg Chest Portable 1 View  Result Date: 01/24/2019 CLINICAL DATA:  Congestive heart failure EXAM: PORTABLE CHEST 1 VIEW COMPARISON:  01/08/2019 FINDINGS: Cardiomegaly. Pulmonary vasculature is within normal limits. No focal consolidation. No pleural effusion. No pneumothorax. No acute osseous findings. IMPRESSION: Cardiomegaly.  Lungs are clear. Electronically Signed   By: Davina Poke M.D.   On: 01/24/2019 11:04   Assessment/Plan The patient is a 43 year old female with a past medical history of hypertension and acute on chronic renal failure who presented [place] emergency department with a chief complaint of left lower extremity pain. 1.  Possible peripheral artery disease: Patient states left lower extremity pain occurs on an intermittent basis.  Acute onset of left lower extremity pain this a.m. prompting her to seek medical attention.  At this time, the pain has resolved.  Faint pedal pulses to the left foot on exam.  CTA with patent anterior tib and peroneal arteries with distal occlusion of the posterior tibial (reviewed CTA with [place]).  There are no signs of emergent ischemia to the left lower extremity at this time.  Agree with  initiation of heparin and observation overnight.  If her physical exam remains unchanged and patient continues to be asymptomatic do not recommend any vascular intervention at this time and would see in the outpatient setting for continued surveillance. 2.  Hypertension: Seems to be noncompliant at times with medication.  Strongly encouraged patient to take medications to prevent any further damage to her heart and kidneys.  Last renal ultrasound without any renal artery stenosis.  Awaiting renal ultrasound from today. 3.  Acute on chronic renal failure: Progression of renal failure with increasing creatinine.  Patient is currently undergoing an ultrasound.   Discussed with Dr. Francene Castle, PA-C  01/24/2019 4:10 PM  This note was created with Dragon medical transcription system.  Any error is purely unintentional

## 2019-01-24 NOTE — Progress Notes (Signed)
[facility]  Daily Progress Note   Subjective   Patient seen and reexamined.  At the present time she states her left foot is not in the severe pain that it was earlier today.  "It just feels cold".  I have personally reviewed the CT angiogram and I have also discussed the study with Dr. Vernard Gambles.  We concur that there appears to be occlusion of the posterior tibial artery as well as the tibioperoneal trunk.  There is poor reconstitution of the posterior tibial but the peroneal reconstitutes quite proximally and appears to be patent down to the ankle.  Dr. Vernard Gambles pointed out for me although the anterior tibial appears to be patent with fairly good filling the entire way down essentially at the origin on a single slice of the scan there is a gap this to could be consistent with embolic material causing a short segment occlusion.  After discussion with Dr. Vernard Gambles I do concur with his findings.  Objective Vitals:   01/24/19 1330 01/24/19 1400 01/24/19 1530 01/24/19 1652  BP: (!) 160/107 (!) 149/115 118/90 (!) 142/108  Pulse: 79 89 81 82  Resp: (!) 26 (!) 24 18 20   Temp:    (!) 97.5 F (36.4 C)  TempSrc:    Oral  SpO2: 100% 99% 97% 100%  Weight:      Height:       No intake or output data in the 24 hours ending 01/24/19 1748  PULM  Normal effort , no use of accessory muscles CV  No JVD, RRR Abd      No distended, nontender VASC  the left popliteal is a 2+ and very strong pulse dorsalis pedis and posterior tibial pulses are nonpalpable on the left.  The left foot is cooler than the right but not dramatically so.  The foot is not mottled.  She is motor and sensory intact.  Laboratory CBC    Component Value Date/Time   WBC 4.4 01/24/2019 0912   HGB 12.8 01/24/2019 0912   HGB 13.0 08/07/2017 1530   HCT 39.0 01/24/2019 0912   HCT 39.2 08/07/2017 1530   PLT 108 (L) 01/24/2019 0912   PLT 145 (L) 08/07/2017 1530    BMET    Component Value Date/Time   NA 129  (L) 01/24/2019 0912   K 4.4 01/24/2019 0912   CL 103 01/24/2019 0912   CO2 21 (L) 01/24/2019 0912   GLUCOSE 94 01/24/2019 0912   BUN 43 (H) 01/24/2019 0912   CREATININE 2.22 (H) 01/24/2019 0912   CALCIUM 8.2 (L) 01/24/2019 0912   GFRNONAA 26 (L) 01/24/2019 0912   GFRAA 31 (L) 01/24/2019 0912    Assessment/Planning:  Arterial embolization to the tibial vessels left lower extremity:    At this point the patient has evidence by both physical examination as well as radiologic studies that is most consistent with embolization to the distal left lower extremity.  Given the findings of the CT scan there does not appear to be a more proximal arterial source and I would have to suspect a cardiac etiology.    At the present time her foot is compensated the pain that she was experiencing this morning has essentially subsided she has motor sensory intact and by my examination does not appear to back have a threatened foot.  Given her renal insufficiency with her creatinine this morning of 2.2 I would elect to hydrate her overnight as she is already had 100+ cc of dye for  her CT angiogram I would maintain her on a heparin drip to prevent further embolization and aid with distal perfusion of her left lower extremity.  I will plan pending tomorrow's labs for left lower extremity angiography with intervention and revascularization.    Risks and benefits were reviewed with the patient all questions have been answered patient agrees to proceed.    Hortencia Pilar  01/24/2019, 5:48 PM

## 2019-01-25 ENCOUNTER — Other Ambulatory Visit (INDEPENDENT_AMBULATORY_CARE_PROVIDER_SITE_OTHER): Payer: Self-pay | Admitting: Vascular Surgery

## 2019-01-25 ENCOUNTER — Inpatient Hospital Stay: Payer: Medicaid Other

## 2019-01-25 ENCOUNTER — Encounter
Admission: EM | Disposition: A | Discharge status: Home / Self Care | Payer: Self-pay | Source: Home / Self Care | Attending: Internal Medicine

## 2019-01-25 DIAGNOSIS — I998 Other disorder of circulatory system: Secondary | ICD-10-CM

## 2019-01-25 DIAGNOSIS — I749 Embolism and thrombosis of unspecified artery: Secondary | ICD-10-CM

## 2019-01-25 HISTORY — PX: LOWER EXTREMITY ANGIOGRAPHY: CATH118251

## 2019-01-25 HISTORY — DX: Embolism and thrombosis of unspecified artery: I74.9

## 2019-01-25 LAB — CBC
HCT: 36.7 % (ref 36.0–46.0)
Hemoglobin: 11.9 g/dL — ABNORMAL LOW (ref 12.0–15.0)
MCH: 33 pg (ref 26.0–34.0)
MCHC: 32.4 g/dL (ref 30.0–36.0)
MCV: 101.7 fL — ABNORMAL HIGH (ref 80.0–100.0)
Platelets: 86 10*3/uL — ABNORMAL LOW (ref 150–400)
RBC: 3.61 MIL/uL — ABNORMAL LOW (ref 3.87–5.11)
RDW: 13.3 % (ref 11.5–15.5)
WBC: 2.1 10*3/uL — ABNORMAL LOW (ref 4.0–10.5)
nRBC: 0 % (ref 0.0–0.2)

## 2019-01-25 LAB — BASIC METABOLIC PANEL
Anion gap: 7 (ref 5–15)
BUN: 33 mg/dL — ABNORMAL HIGH (ref 6–20)
CO2: 22 mmol/L (ref 22–32)
Calcium: 7.6 mg/dL — ABNORMAL LOW (ref 8.9–10.3)
Chloride: 101 mmol/L (ref 98–111)
Creatinine, Ser: 1.9 mg/dL — ABNORMAL HIGH (ref 0.44–1.00)
GFR calc Af Amer: 37 mL/min — ABNORMAL LOW (ref >60)
GFR calc non Af Amer: 32 mL/min — ABNORMAL LOW (ref >60)
Glucose, Bld: 99 mg/dL (ref 70–99)
Potassium: 4.7 mmol/L (ref 3.5–5.1)
Sodium: 130 mmol/L — ABNORMAL LOW (ref 135–145)

## 2019-01-25 LAB — HEPARIN LEVEL (UNFRACTIONATED)
Heparin Unfractionated: 0.32 IU/mL (ref 0.30–0.70)
Heparin Unfractionated: 0.13 IU/mL — ABNORMAL LOW (ref 0.30–0.70)

## 2019-01-25 SURGERY — LOWER EXTREMITY ANGIOGRAPHY
Anesthesia: Moderate Sedation | Laterality: Left

## 2019-01-25 MED ORDER — OXYCODONE HCL 5 MG PO TABS: 5.0000 mg | ORAL_TABLET | ORAL | Status: DC | PRN

## 2019-01-25 MED ORDER — FENTANYL CITRATE (PF) 100 MCG/2ML IJ SOLN
INTRAMUSCULAR | Status: AC
  Administered 2019-01-25: 16:00:00
  Filled 2019-01-25: qty 2

## 2019-01-25 MED ORDER — MIDAZOLAM HCL 5 MG/5ML IJ SOLN
INTRAMUSCULAR | Status: AC
  Administered 2019-01-25: 16:00:00
  Filled 2019-01-25: qty 5

## 2019-01-25 MED ORDER — SODIUM CHLORIDE 0.9% FLUSH
3.0000 mL | Freq: Two times a day (BID) | INTRAVENOUS | Status: DC
  Administered 2019-01-25 – 2019-01-27 (×4): 3 mL via INTRAVENOUS

## 2019-01-25 MED ORDER — IODIXANOL 320 MG/ML IV SOLN
INTRAVENOUS | Status: DC | PRN
  Administered 2019-01-25: 17:00:00 30 mL via INTRAVENOUS

## 2019-01-25 MED ORDER — MIDAZOLAM HCL 2 MG/ML PO SYRP
8.0000 mg | ORAL_SOLUTION | Freq: Once | ORAL | Status: DC | PRN
  Filled 2019-01-25: qty 4

## 2019-01-25 MED ORDER — FAMOTIDINE 20 MG PO TABS: 40.0000 mg | ORAL_TABLET | Freq: Once | ORAL | Status: DC | PRN

## 2019-01-25 MED ORDER — SODIUM CHLORIDE 0.9% FLUSH: 3.0000 mL | INTRAVENOUS | Status: DC | PRN

## 2019-01-25 MED ORDER — ACETAMINOPHEN 325 MG PO TABS: 650.0000 mg | ORAL_TABLET | Freq: Four times a day (QID) | ORAL | Status: DC | PRN

## 2019-01-25 MED ORDER — HEPARIN SODIUM (PORCINE) 1000 UNIT/ML IJ SOLN
INTRAMUSCULAR | Status: DC | PRN
  Administered 2019-01-25: 5000 [IU] via INTRAVENOUS

## 2019-01-25 MED ORDER — HYDROMORPHONE HCL 1 MG/ML IJ SOLN: 1.0000 mg | Freq: Once | INTRAMUSCULAR | Status: DC | PRN

## 2019-01-25 MED ORDER — HEPARIN (PORCINE) 25000 UT/250ML-% IV SOLN
750.0000 [IU]/h | INTRAVENOUS | Status: DC
  Administered 2019-01-25 (×2): 750 [IU]/h via INTRAVENOUS
  Filled 2019-01-25: qty 250

## 2019-01-25 MED ORDER — CEFAZOLIN SODIUM-DEXTROSE 1-4 GM/50ML-% IV SOLN
1.0000 g | Freq: Once | INTRAVENOUS | Status: AC
  Administered 2019-01-25: 1 g via INTRAVENOUS
  Filled 2019-01-25: qty 50

## 2019-01-25 MED ORDER — SODIUM CHLORIDE 0.9 % IV BOLUS
INTRAVENOUS | Status: AC | PRN
  Administered 2019-01-25: 500 mL via INTRAVENOUS

## 2019-01-25 MED ORDER — NITROGLYCERIN 1 MG/10 ML FOR IR/CATH LAB
INTRA_ARTERIAL | Status: DC | PRN
  Administered 2019-01-25 (×2): 100 ug via INTRA_ARTERIAL

## 2019-01-25 MED ORDER — SODIUM CHLORIDE 0.9 % IV SOLN: 250.0000 mL | INTRAVENOUS | Status: DC | PRN

## 2019-01-25 MED ORDER — SODIUM CHLORIDE 0.9 % IV SOLN
INTRAVENOUS | Status: DC
  Administered 2019-01-25: 14:00:00 via INTRAVENOUS

## 2019-01-25 MED ORDER — ACETAMINOPHEN 325 MG PO TABS: 650.0000 mg | ORAL_TABLET | ORAL | Status: DC | PRN

## 2019-01-25 MED ORDER — CLOPIDOGREL BISULFATE 75 MG PO TABS
75.0000 mg | ORAL_TABLET | Freq: Every day | ORAL | Status: DC
  Administered 2019-01-26 – 2019-01-27 (×2): 75 mg via ORAL
  Filled 2019-01-25 (×2): qty 1

## 2019-01-25 MED ORDER — DIPHENHYDRAMINE HCL 50 MG/ML IJ SOLN: 50.0000 mg | Freq: Once | INTRAMUSCULAR | Status: DC | PRN

## 2019-01-25 MED ORDER — HEPARIN SODIUM (PORCINE) 1000 UNIT/ML IJ SOLN
INTRAMUSCULAR | Status: AC
  Administered 2019-01-25: 16:00:00
  Filled 2019-01-25: qty 1

## 2019-01-25 MED ORDER — FENTANYL CITRATE (PF) 100 MCG/2ML IJ SOLN
INTRAMUSCULAR | Status: DC | PRN
  Administered 2019-01-25: 25 ug via INTRAVENOUS
  Administered 2019-01-25: 50 ug via INTRAVENOUS

## 2019-01-25 MED ORDER — METHYLPREDNISOLONE SODIUM SUCC 125 MG IJ SOLR: 125.0000 mg | Freq: Once | INTRAMUSCULAR | Status: DC | PRN

## 2019-01-25 MED ORDER — ONDANSETRON HCL 4 MG/2ML IJ SOLN: 4.0000 mg | Freq: Four times a day (QID) | INTRAMUSCULAR | Status: DC | PRN

## 2019-01-25 MED ORDER — MIDAZOLAM HCL 2 MG/2ML IJ SOLN
INTRAMUSCULAR | Status: DC | PRN
  Administered 2019-01-25: 1 mg via INTRAVENOUS
  Administered 2019-01-25: 2 mg via INTRAVENOUS

## 2019-01-25 MED ORDER — MORPHINE SULFATE (PF) 2 MG/ML IV SOLN: 2.0000 mg | INTRAVENOUS | Status: DC | PRN

## 2019-01-25 MED ORDER — SODIUM CHLORIDE 0.9 % IV SOLN
INTRAVENOUS | Status: AC
  Administered 2019-01-25: 18:00:00 via INTRAVENOUS

## 2019-01-25 SURGICAL SUPPLY — 23 items
BALLN ULTRVRSE 2.5X40X150 (BALLOONS) ×3
BALLN ULTRVRSE 3X100X150 (BALLOONS) ×3
BALLOON ULTRVRSE 150X40X2.5 (BALLOONS) ×1 IMPLANT
BALLOON ULTRVRSE 3X100X150 (BALLOONS) ×1 IMPLANT
CANISTER PENUMBRA ENGINE (MISCELLANEOUS) ×3 IMPLANT
CATH BEACON 5 .035 65 RIM TIP (CATHETERS) ×3 IMPLANT
CATH INDIGO CAT RX KIT (CATHETERS) ×3 IMPLANT
CATH INDIGO CAT6 KIT (CATHETERS) ×3 IMPLANT
CATH VERT 5FR 125CM (CATHETERS) ×3 IMPLANT
DEVICE PRESTO INFLATION (MISCELLANEOUS) ×3 IMPLANT
DEVICE STARCLOSE SE CLOSURE (Vascular Products) ×3 IMPLANT
GUIDEWIRE PFTE-COATED .018X300 (WIRE) ×3 IMPLANT
NEEDLE ENTRY 21GA 7CM ECHOTIP (NEEDLE) ×3 IMPLANT
PACK ANGIOGRAPHY (CUSTOM PROCEDURE TRAY) ×3 IMPLANT
SET INTRO CAPELLA COAXIAL (SET/KITS/TRAYS/PACK) ×3 IMPLANT
SHEATH BRITE TIP 5FRX11 (SHEATH) ×3 IMPLANT
SHEATH PINNACLE ST 6F 45CM (SHEATH) ×3 IMPLANT
SYR MEDRAD MARK 7 150ML (SYRINGE) ×3 IMPLANT
TUBING CONTRAST HIGH PRESS 72 (TUBING) ×3 IMPLANT
VALVE HEMO TOUHY BORST Y (ADAPTER) ×3 IMPLANT
WIRE J 3MM .035X145CM (WIRE) ×3 IMPLANT
WIRE MAGIC TORQUE 260C (WIRE) ×3 IMPLANT
WIRE RUNTHROUGH .014X300CM (WIRE) ×3 IMPLANT

## 2019-01-25 NOTE — Progress Notes (Signed)
[facility] for heparin Indication: limb ischemia  Allergies  Allergen Reactions  . Sulfa Antibiotics Rash    Patient Measurements: Height: 5\' 3"  (160 cm) Weight: 115 lb (52.2 kg) IBW/kg (Calculated) : 52.4 Heparin Dosing Weight: 52.2 kg  Vital Signs: Temp: 97.8 F (36.6 C) (08/12 0439) Temp Source: Oral (08/12 0439) BP: 122/92 (08/12 0439) Pulse Rate: 71 (08/12 0439)  Labs: Recent Labs    01/24/19 0912 01/24/19 1258 01/24/19 2209 01/25/19 0503  HGB 12.8  --   --  11.9*  HCT 39.0  --   --  36.7  PLT 108*  --   --  86*  APTT  --  36  --   --   LABPROT  --  13.9  --   --   INR  --  1.1  --   --   HEPARINUNFRC  --   --  0.21* 0.32  CREATININE 2.22*  --   --  1.90*    Estimated Creatinine Clearance: 31.8 mL/min (A) (by C-G formula based on SCr of 1.9 mg/dL (H)).   Medical History: Past Medical History:  Diagnosis Date  . CHF (congestive heart failure) ([place])   . Chronic kidney disease   . Hypertension     Assessment: 43 year old female presented with leg numbness, feet cold to touch. No anticoagulation listed on home meds. Pharmacy consulted to dose heparin for limb ischemia.  Goal of Therapy:  Heparin level 0.3-0.7 units/ml Monitor platelets by anticoagulation protocol: Yes   Plan:  08/12 @ 0500 HL 0.32 therapeutic. Will continue rate at 750 units/hr and will recheck HL @ 1100, f/u CBC stable; however, plts went down from 108 >> 86, will continue to monitor.  Pharmacy to follow and adjust as indicated.  Tobie Lords, PharmD Clinical Pharmacist 01/25/2019,6:30 AM

## 2019-01-25 NOTE — Progress Notes (Signed)
[facility] for heparin Indication: limb ischemia  Patient Measurements: Height: 5\' 3"  (160 cm) Weight: 115 lb (52.2 kg) IBW/kg (Calculated) : 52.4 Heparin Dosing Weight: 52.2 kg  Vital Signs: Temp: 97.8 F (36.6 C) (08/12 0439) Temp Source: Oral (08/12 0439) BP: 122/92 (08/12 0439) Pulse Rate: 71 (08/12 0439)  Labs: Recent Labs    01/24/19 0912 01/24/19 1258 01/24/19 2209 01/25/19 0503  HGB 12.8  --   --  11.9*  HCT 39.0  --   --  36.7  PLT 108*  --   --  86*  APTT  --  36  --   --   LABPROT  --  13.9  --   --   INR  --  1.1  --   --   HEPARINUNFRC  --   --  0.21* 0.32  CREATININE 2.22*  --   --  1.90*    Estimated Creatinine Clearance: 31.8 mL/min (A) (by C-G formula based on SCr of 1.9 mg/dL (H)).   Medical History: Past Medical History:  Diagnosis Date  . CHF (congestive heart failure) ([place])   . Chronic kidney disease   . Hypertension     Assessment: 43 year old female presented with leg numbness, feet cold to touch. No anticoagulation listed on home meds. Pharmacy consulted to dose heparin for limb ischemia.  Goal of Therapy:  Heparin level 0.3-0.7 units/ml Monitor platelets by anticoagulation protocol: Yes   Plan:   HL 0.13 at 1101: subtherapeutic:   Vascular surgery intervention requiring lower extremity angiography prior to any rate change  If vascular surgery wants to continue heparin after procedure rate will be adjusted based on previous level  Pharmacy to follow and adjust as indicated.  Dallie Piles, PharmD Clinical Pharmacist 01/25/2019,8:54 AM

## 2019-01-25 NOTE — Progress Notes (Signed)
Per Dr. Delana Meyer to restart heparin gtt at previous rate 750 units/hr, no bolus at 1800. Reported off to [facility] RN 2 C

## 2019-01-25 NOTE — Op Note (Signed)
[facility] Percutaneous Study/Intervention Procedural Note   Date of Surgery: 01/25/2019  Surgeon: Hortencia Pilar  Pre-operative Diagnosis: Ischemia left lower extremity likely secondary to embolic disease bilateral  left lower extremity  Post-operative diagnosis: Same  Procedure(s) Performed: 1. Introduction catheter into left lower extremity 3rd order catheter placement  2. Contrast injection left lower extremity for distal runoff with additional 3rd order  3. Thromboembolectomy using the penumbra Cat Rx              4. Percutaneous transluminal angioplasty left anterior tibial to 3 mm  5. Star close closure right common femoral arteriotomy  Anesthesia: Conscious sedation was administered under my direct supervision by the interventional radiology RN. IV Versed plus fentanyl were utilized. Continuous ECG, pulse oximetry and blood pressure was monitored throughout the entire procedure.  Conscious sedation was for a total of approximately 1 hour.  Sheath: 6 [place] destination sheath right common femoral retrograde  Contrast: 30 cc  Fluoroscopy Time: 6.2 minutes  Indications: Rebecca Bridges presents with increasing pain of the left lower extremity.  CT angiogram is consistent with embolization of the posterior tibial, peroneal proximally, tibioperoneal trunk and the origin of the anterior tibial.  This suggests the patient is having limb threatening ischemia. The risks and benefits are reviewed all questions answered patient agrees to proceed.  Procedure:Rebecca Bridges is a 43 y.o. y.o. female who was identified and appropriate procedural time out was performed. The patient was then placed supine on the table and prepped and draped in the usual sterile fashion.   Ultrasound was placed in the sterile sleeve and the right groin was evaluated the right common femoral artery was echolucent and pulsatile  indicating patency. Image was recorded for the permanent record and under real-time visualization a microneedle was inserted into the common femoral artery followed by the microwire and then the micro-sheath. A J-wire was then advanced through the micro-sheath and a 5 [place] sheath was then inserted over a J-wire. J-wire was then advanced and a rim catheter is used to hook the aortic bifurcation.  J-wire was then advanced down to the groin and the catheter is advanced.  Wires then negotiated into the SFA followed by the rim catheter.  This is done without contrast.  This is because the CT angiogram demonstrated that the aorta iliac and femoral-popliteal systems appeared to be free of any significant disease.  Stiff angled Glidewire was then introduced in the rim catheter was removed.  5000 units of heparin was then given and allowed to circulate and a 6 [place] destination sheath was advanced up and over the bifurcation and positioned in the femoral artery  Kumpe catheter was then advanced down to the level of the tibial plateau and hand-injection of contrast using dilute Visipaque was performed this demonstrated patency of the distal popliteal there is occlusion of the tibioperoneal trunk there is occlusion of the posterior tibial and peroneal which are nonvisualized throughout this field there is occlusion of the anterior tibial at its origin extending for 2 to 3 cm the anterior tibial then reconstitutes and appears patent down to the ankle this confirms findings from the CT angiogram.  A 0.018 advantage wire was then negotiated into the anterior tibial.  Initially the CAT 6 device was utilized at the origin of the anterior tibial but this would not track across the lesion.  I selected a 2.5 mm x 40 mm ultra versed balloon and angioplasty was performed of the proximal anterior tibial.  Inflation  was to 10 atm for 1 minute.  Follow-up imaging demonstrated very modest improvement.  I then introduced the  penumbra Cat Rx and made 3 passes through the anterior tibial.  I also instilled approximately 200 mcg of nitroglycerin intra-arterially.  Follow-up imaging now demonstrated there was patency but greater than 70% residual stenosis throughout the proximal anterior tibial.  I selected a 3 mm x 100 mm Ultraverse balloon advanced across the affected areas and inflated it to 6 atm for 1 full minute.  Follow-up imaging demonstrated wide patency of the anterior tibial.  There is less than 5% residual stenosis throughout the artery.  Distal runoff is also assessed and there is now been recruitment of the distal two thirds of the peroneal artery as well.  Pedal arch fills via the anterior tibial and dorsalis pedis.  Given her renal dysfunction and the very modest amount of contrast were used I elected to terminate the procedure at this point and can discuss attempts at salvaging the peroneal artery with the patient at another day which will minimize the contrast exposure.  After review of the final images the sheath is pulled into the right external iliac oblique of the common femoral is obtained and a Star close device deployed. There no immediate Complications.  Findings:  As noted above the distal half of the popliteal artery is widely patent consistent with the findings of the CT angiogram.  There is occlusion over a fairly short segment of the anterior tibial from its origin extending approximately 2 to 3 cm.  There is occlusion of the distal tibioperoneal trunk posterior tibial and on the initial images the peroneal is completely nonvisualized as well.  Following a combination of thromboembolectomy and angioplasty of the anterior tibial to a maximum of 3 mm in diameter there is now wide patency of the anterior tibial with recruitment of the peroneal and filling of the pedal arch.   Summary: Successful recanalization left lower extremity for limb salvage   Disposition: Patient was  taken to the recovery room in stable condition having tolerated the procedure well.  Belenda Cruise  01/25/2019,5:17 PM

## 2019-01-25 NOTE — Progress Notes (Signed)
[facility] [place] NAME: Rebecca Bridges    MR#:  [id number]  DATE OF BIRTH:  [birth year]  SUBJECTIVE:  CHIEF COMPLAINT:   Chief Complaint  Patient presents with   Leg Pain   Better left leg pain.  On heparin drip. REVIEW OF SYSTEMS:  Review of Systems  Constitutional: Negative for chills, fever and malaise/fatigue.  HENT: Negative for sore throat.   Eyes: Negative for blurred vision and double vision.  Respiratory: Negative for cough, hemoptysis, shortness of breath, wheezing and stridor.   Cardiovascular: Negative for chest pain, palpitations, orthopnea and leg swelling.  Gastrointestinal: Negative for abdominal pain, blood in stool, diarrhea, melena, nausea and vomiting.  Genitourinary: Negative for dysuria, flank pain and hematuria.  Musculoskeletal: Negative for back pain and joint pain.       Left leg pain.  Skin: Negative for rash.  Neurological: Negative for dizziness, sensory change, focal weakness, seizures, loss of consciousness, weakness and headaches.  Endo/Heme/Allergies: Negative for polydipsia.  Psychiatric/Behavioral: Negative for depression. The patient is not nervous/anxious.     DRUG ALLERGIES:   Allergies  Allergen Reactions   Sulfa Antibiotics Rash   VITALS:  Blood pressure 122/90, pulse 75, temperature 98.3 F (36.8 C), temperature source Oral, resp. rate 16, height 5\' 3"  (1.6 m), weight 52.2 kg, last menstrual period 01/14/2019, SpO2 99 %. PHYSICAL EXAMINATION:  Physical Exam Constitutional:      General: She is not in acute distress. HENT:     Head: Normocephalic.     Mouth/Throat:     Mouth: Mucous membranes are moist.  Eyes:     General: No scleral icterus.    Conjunctiva/sclera: Conjunctivae normal.     Pupils: Pupils are equal, round, and reactive to light.  Neck:     Musculoskeletal: Normal range of motion and neck supple.     Vascular: No JVD.     Trachea: No tracheal deviation.    Cardiovascular:     Rate and Rhythm: Normal rate and regular rhythm.     Heart sounds: Normal heart sounds. No murmur. No gallop.   Pulmonary:     Effort: Pulmonary effort is normal. No respiratory distress.     Breath sounds: Normal breath sounds. No wheezing or rales.  Abdominal:     General: Bowel sounds are normal. There is no distension.     Palpations: Abdomen is soft.     Tenderness: There is no abdominal tenderness. There is no rebound.  Musculoskeletal: Normal range of motion.        General: No tenderness.     Right lower leg: No edema.     Left lower leg: No edema.  Skin:    Findings: No erythema or rash.  Neurological:     Mental Status: She is alert and oriented to person, place, and time.     Cranial Nerves: No cranial nerve deficit.  Psychiatric:        Mood and Affect: Mood normal.    LABORATORY PANEL:  Female CBC Recent Labs  Lab 01/25/19 0503  WBC 2.1*  HGB 11.9*  HCT 36.7  PLT 86*   ------------------------------------------------------------------------------------------------------------------ Chemistries  Recent Labs  Lab 01/24/19 0912 01/25/19 0503  NA 129* 130*  K 4.4 4.7  CL 103 101  CO2 21* 22  GLUCOSE 94 99  BUN 43* 33*  CREATININE 2.22* 1.90*  CALCIUM 8.2* 7.6*  AST 35  --   ALT 19  --   ALKPHOS  63  --   BILITOT 0.6  --    RADIOLOGY:  Dg Chest 2 View  Result Date: 01/25/2019 CLINICAL DATA:  CHF EXAM: CHEST - 2 VIEW COMPARISON:  Chest CT from yesterday FINDINGS: Cardiomegaly. There is no edema, consolidation, effusion, or pneumothorax. Cholecystectomy clips. IMPRESSION: Cardiomegaly without failure. Electronically Signed   By: Monte Fantasia M.D.   On: 01/25/2019 07:10   US Renal  Result Date: 01/24/2019 CLINICAL DATA:  Acute on chronic renal failure EXAM: RENAL / URINARY TRACT ULTRASOUND COMPLETE COMPARISON:  August 09, 2017 FINDINGS: Right Kidney: Renal measurements: 9.0 x 3.8 x 5.3 cm = volume: 95 mL. There is increased  echogenicity seen throughout the renal parenchyma. No mass or hydronephrosis. Left Kidney: Renal measurements: 9.8 x 4.5 x 4.7 cm = volume: 106 mL. There is increased echogenicity within the renal parenchyma. No mass or hydronephrosis. Bladder: Appears normal for degree of bladder distention. Normal bilateral ureteral jets. IMPRESSION: Findings which could be suggestive of acute kidney injury. No hydronephrosis. Electronically Signed   By: Prudencio Pair M.D.   On: 01/24/2019 17:10   ASSESSMENT AND PLAN:   Arterial embolization to the tibial vessels left lower extremity. Continue heparin IV drip.  PRN pain medication for support. CT angiogram  1. 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 2. Mild aortoiliac  atherosclerosis Dr. Clinton Quant a cardiac etiology.  *Back pain Resolved.  *Acute on CKD stage III. Baseline creatinine appears to be around 1.5. Improving with IV fluid Hold hydrochlorothiazide and lisinopril. Renal ultrasound did not report hydronephrosis.  Hyponatremia.  Normal saline IV and follow-up sodium level.  *Hypertension Continue amlodipine and carvedilol. Hold hydrochlorothiazide and lisinopril.  *Active smoking Counseled to quit smoking for 4 minutes and offered nicotine patch.  All the records are reviewed and case discussed with Care Management/Social Worker. Management plans discussed with the patient, family and they are in agreement.  CODE STATUS: Full Code  TOTAL TIME TAKING CARE OF THIS PATIENT: 27 minutes.   More than 50% of the time was spent in counseling/coordination of care: YES  POSSIBLE D/C IN 2 DAYS, DEPENDING ON CLINICAL CONDITION.   Demetrios Loll M.D on 01/25/2019 at 3:20 PM  Between 7am to 6pm - Pager - [phone]  After 6pm go to [email or web address] - [facility]

## 2019-01-25 NOTE — Progress Notes (Signed)
[facility]  ROUNDING NOTE   Subjective:  Patient known to [place] from prior admission. Presents now with LLE embolic disease. Found to have acute renal failure in the setting chronic kidney disease.  Objective:  Vital signs in last 24 hours:  Temp:  [97.5 F (36.4 C)-98.3 F (36.8 C)] 98.3 F (36.8 C) (08/12 1341) Pulse Rate:  [71-82] 75 (08/12 1341) Resp:  [16-20] 16 (08/12 1341) BP: (113-142)/(88-108) 122/90 (08/12 1341) SpO2:  [99 %-100 %] 99 % (08/12 1341)  Weight change:  Filed Weights   01/24/19 0900  Weight: 52.2 kg    Intake/Output: I/O last 3 completed shifts: In: 1205 [I.V.:1205] Out: -    Intake/Output this shift:  No intake/output data recorded.  Physical Exam: General: No acute distress  Head: Normocephalic, atraumatic. Moist oral mucosal membranes  Eyes: Anicteric  Neck: Supple, trachea midline  Lungs:  Clear to auscultation, normal effort  Heart: S1S2 no rubs  Abdomen:  Soft, nontender, bowel sounds present  Extremities: No peripheral edema.  Neurologic: Awake, alert, following commands  Skin: No lesions       Basic Metabolic Panel: Recent Labs  Lab 01/24/19 0912 01/25/19 0503  NA 129* 130*  K 4.4 4.7  CL 103 101  CO2 21* 22  GLUCOSE 94 99  BUN 43* 33*  CREATININE 2.22* 1.90*  CALCIUM 8.2* 7.6*    Liver Function Tests: Recent Labs  Lab 01/24/19 0912  AST 35  ALT 19  ALKPHOS 63  BILITOT 0.6  PROT 9.8*  ALBUMIN 3.4*   No results for input(s): LIPASE, AMYLASE in the last 168 hours. No results for input(s): AMMONIA in the last 168 hours.  CBC: Recent Labs  Lab 01/24/19 0912 01/25/19 0503  WBC 4.4 2.1*  NEUTROABS 3.4  --   HGB 12.8 11.9*  HCT 39.0 36.7  MCV 101.6* 101.7*  PLT 108* 86*    Cardiac Enzymes: No results for input(s): CKTOTAL, CKMB, CKMBINDEX, TROPONINI in the last 168 hours.  BNP: Invalid input(s): POCBNP  CBG: No results for input(s): GLUCAP in the last 168  hours.  Microbiology: Results for orders placed or performed during the hospital encounter of 01/24/19  SARS CORONAVIRUS 2 Nasal Swab Aptima Multi Swab     Status: None   Collection Time: 01/24/19 11:23 AM   Specimen: Aptima Multi Swab; Nasal Swab  Result Value Ref Range Status   SARS Coronavirus 2 NEGATIVE NEGATIVE Final    Comment: (NOTE) SARS-CoV-2 target nucleic acids are NOT DETECTED. The SARS-CoV-2 RNA is generally detectable in upper and lower respiratory specimens during the acute phase of infection. Negative results do not preclude SARS-CoV-2 infection, do not rule out co-infections with other pathogens, and should not be used as the sole basis for treatment or other patient management decisions. Negative results must be combined with clinical observations, patient history, and epidemiological information. The expected result is Negative. Fact Sheet for Patients: SugarRoll.be Fact Sheet for Healthcare Providers: https://www.woods-mathews.com/ This test is not yet approved or cleared by the [place] FDA and  has been authorized for detection and/or diagnosis of SARS-CoV-2 by FDA under an Emergency Use Authorization (EUA). This EUA will remain  in effect (meaning this test can be used) for the duration of the COVID-19 declaration under Section 56 4(b)(1) of the Act, 21 U.S.C. section 360bbb-3(b)(1), unless the authorization is terminated or revoked sooner. Performed at [facility], [place] [address] [place], [place] [address]     Coagulation Studies: Recent Labs  01/24/19 1258  LABPROT 13.9  INR 1.1    Urinalysis: No results for input(s): COLORURINE, LABSPEC, PHURINE, GLUCOSEU, HGBUR, BILIRUBINUR, KETONESUR, PROTEINUR, UROBILINOGEN, NITRITE, LEUKOCYTESUR in the last 72 hours.  Invalid input(s): APPERANCEUR    Imaging: Dg Chest 2 View  Result Date: 01/25/2019 CLINICAL DATA:  CHF EXAM: CHEST - 2 VIEW  COMPARISON:  Chest CT from yesterday FINDINGS: Cardiomegaly. There is no edema, consolidation, effusion, or pneumothorax. Cholecystectomy clips. IMPRESSION: Cardiomegaly without failure. Electronically Signed   By: Monte Fantasia M.D.   On: 01/25/2019 07:10   Ct Angio Aortobifemoral W And/or Wo Contrast  Result Date: 01/24/2019 CLINICAL DATA:  Chest pain.  Left foot pain. EXAM: CT ANGIOGRAM CHEST CT ANGIOGRAPHY OF ABDOMINAL AORTA WITH ILIOFEMORAL RUNOFF TECHNIQUE: Multidetector CT imaging of the chest, abdomen, pelvis and lower extremities was performed using the standard protocol during bolus administration of intravenous contrast. Multiplanar CT image reconstructions and MIPs were obtained to evaluate the vascular anatomy. CONTRAST:  129mL OMNIPAQUE IOHEXOL 350 MG/ML SOLN COMPARISON:  08/07/2017 FINDINGS: CTA CHEST FINDINGS Cardiovascular: Mild cardiomegaly with suspected left ventricular enlargement. No pericardial effusion. The RV is nondilated. Satisfactory opacification of pulmonary arteries noted, and there is no evidence of pulmonary emboli. Adequate contrast opacification of the thoracic aorta with no evidence of dissection, aneurysm, or stenosis. There is classic 3-vessel brachiocephalic arch anatomy without proximal stenosis. Mild atheromatous plaque in the descending thoracic aorta. Mediastinum/Nodes: No hilar or mediastinal adenopathy. Lungs/Pleura: No pleural effusion. No pneumothorax. Lungs are clear. Musculoskeletal: No chest wall abnormality. No acute or significant osseous findings. Review of the MIP images confirms the above findings. CTA ABDOMEN AND PELVIS FINDINGS VASCULAR Aorta: Mild calcified atheromatous plaque. No dissection, aneurysm, or stenosis. Celiac: Patent without evidence of aneurysm, dissection, vasculitis or significant stenosis. SMA: Patent without evidence of aneurysm, dissection, vasculitis or significant stenosis. Renals: Both renal arteries are patent without evidence of  aneurysm, dissection, vasculitis, fibromuscular dysplasia or significant stenosis. IMA: Patent without evidence of aneurysm, dissection, vasculitis or significant stenosis. RIGHT Lower Extremity Inflow: Mild nonocclusive plaque in the common and internal iliac arteries. External iliac widely patent. No aneurysm or stenosis. Outflow: Common femoral patent Deep femoral branches patent SFA patent Popliteal patent Runoff: Dominant posterior tibial runoff across the ankle. Peroneal is seen to the ankle. Anterior tibial is diminutive, not seen beyond the lower calf. LEFT Lower Extremity Inflow: Minimal calcified plaque.  No aneurysm or stenosis. Outflow: Common femoral patent. Deep femoral branches patent. SFA patent Popliteal patent Runoff: Anterior tibial origin occlusion over approximately 2 cm, reconstituted distally and patent to the ankle. Occlusion of the tibioperoneal trunk with collateral reconstitution of contiguous peroneal runoff. A diminutive posterior tibial artery is seen to above the ankle. Veins: No obvious venous abnormality within the limitations of this arterial phase study. Review of the MIP images confirms the above findings. NON-VASCULAR Hepatobiliary: No focal liver abnormality is seen. Status post cholecystectomy. No biliary dilatation. Pancreas: Unremarkable. No pancreatic ductal dilatation or surrounding inflammatory changes. Spleen: Normal in size without focal abnormality. Adrenals/Urinary Tract: Adrenals and kidneys unremarkable. Urinary bladder incompletely distended, mildly thick-walled. Stomach/Bowel: Stomach, small bowel unremarkable. Appendix not visualized. No pericecal inflammatory/edematous change. The colon is nondistended, unremarkable. Lymphatic: No abdominal or pelvic adenopathy. A few subcentimeter left para-aortic and pericaval nodes. Reproductive: Uterus and bilateral adnexa are unremarkable. Other: No ascites.  No free air. Musculoskeletal: No acute or significant osseous  findings. IMPRESSION: VASCULAR 1. 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 2.  Mild aortoiliac  atherosclerosis (ICD10-170.0). NON-VASCULAR 1. No acute findings. Electronically Signed   By: Lucrezia Europe M.D.   On: 01/24/2019 16:33   US Renal  Result Date: 01/24/2019 CLINICAL DATA:  Acute on chronic renal failure EXAM: RENAL / URINARY TRACT ULTRASOUND COMPLETE COMPARISON:  August 09, 2017 FINDINGS: Right Kidney: Renal measurements: 9.0 x 3.8 x 5.3 cm = volume: 95 mL. There is increased echogenicity seen throughout the renal parenchyma. No mass or hydronephrosis. Left Kidney: Renal measurements: 9.8 x 4.5 x 4.7 cm = volume: 106 mL. There is increased echogenicity within the renal parenchyma. No mass or hydronephrosis. Bladder: Appears normal for degree of bladder distention. Normal bilateral ureteral jets. IMPRESSION: Findings which could be suggestive of acute kidney injury. No hydronephrosis. Electronically Signed   By: Prudencio Pair M.D.   On: 01/24/2019 17:10   US Venous Img Lower Unilateral Left  Result Date: 01/24/2019 CLINICAL DATA:  Pain EXAM: LEFT LOWER EXTREMITY VENOUS DOPPLER ULTRASOUND TECHNIQUE: Gray-scale sonography with compression, as well as color and duplex ultrasound, were performed to evaluate the deep venous system from the level of the common femoral vein through the popliteal and proximal calf veins. COMPARISON:  None FINDINGS: Normal compressibility of the common femoral, superficial femoral, and popliteal veins, as well as the proximal calf veins. No filling defects to suggest DVT on grayscale or color Doppler imaging. Doppler waveforms show normal direction of venous flow, normal respiratory phasicity and response to augmentation. Survey views of the contralateral common femoral vein are unremarkable. IMPRESSION: No femoropopliteal and no calf DVT in the visualized calf veins. If clinical symptoms are inconsistent or if there are  persistent or worsening symptoms, further imaging (possibly involving the iliac veins) may be warranted. Electronically Signed   By: Lucrezia Europe M.D.   On: 01/24/2019 10:07   Dg Chest Portable 1 View  Result Date: 01/24/2019 CLINICAL DATA:  Congestive heart failure EXAM: PORTABLE CHEST 1 VIEW COMPARISON:  01/08/2019 FINDINGS: Cardiomegaly. Pulmonary vasculature is within normal limits. No focal consolidation. No pleural effusion. No pneumothorax. No acute osseous findings. IMPRESSION: Cardiomegaly.  Lungs are clear. Electronically Signed   By: Davina Poke M.D.   On: 01/24/2019 11:04   Ct Angio Chest/abd/pel For Dissection W And/or Wo Contrast  Result Date: 01/24/2019 CLINICAL DATA:  Chest pain.  Left foot pain. EXAM: CT ANGIOGRAM CHEST CT ANGIOGRAPHY OF ABDOMINAL AORTA WITH ILIOFEMORAL RUNOFF TECHNIQUE: Multidetector CT imaging of the chest, abdomen, pelvis and lower extremities was performed using the standard protocol during bolus administration of intravenous contrast. Multiplanar CT image reconstructions and MIPs were obtained to evaluate the vascular anatomy. CONTRAST:  152mL OMNIPAQUE IOHEXOL 350 MG/ML SOLN COMPARISON:  08/07/2017 FINDINGS: CTA CHEST FINDINGS Cardiovascular: Mild cardiomegaly with suspected left ventricular enlargement. No pericardial effusion. The RV is nondilated. Satisfactory opacification of pulmonary arteries noted, and there is no evidence of pulmonary emboli. Adequate contrast opacification of the thoracic aorta with no evidence of dissection, aneurysm, or stenosis. There is classic 3-vessel brachiocephalic arch anatomy without proximal stenosis. Mild atheromatous plaque in the descending thoracic aorta. Mediastinum/Nodes: No hilar or mediastinal adenopathy. Lungs/Pleura: No pleural effusion. No pneumothorax. Lungs are clear. Musculoskeletal: No chest wall abnormality. No acute or significant osseous findings. Review of the MIP images confirms the above findings. CTA ABDOMEN  AND PELVIS FINDINGS VASCULAR Aorta: Mild calcified atheromatous plaque. No dissection, aneurysm, or stenosis. Celiac: Patent without evidence of aneurysm, dissection, vasculitis or significant stenosis. SMA: Patent without evidence of aneurysm, dissection, vasculitis or significant stenosis.  Renals: Both renal arteries are patent without evidence of aneurysm, dissection, vasculitis, fibromuscular dysplasia or significant stenosis. IMA: Patent without evidence of aneurysm, dissection, vasculitis or significant stenosis. RIGHT Lower Extremity Inflow: Mild nonocclusive plaque in the common and internal iliac arteries. External iliac widely patent. No aneurysm or stenosis. Outflow: Common femoral patent Deep femoral branches patent SFA patent Popliteal patent Runoff: Dominant posterior tibial runoff across the ankle. Peroneal is seen to the ankle. Anterior tibial is diminutive, not seen beyond the lower calf. LEFT Lower Extremity Inflow: Minimal calcified plaque.  No aneurysm or stenosis. Outflow: Common femoral patent. Deep femoral branches patent. SFA patent Popliteal patent Runoff: Anterior tibial origin occlusion over approximately 2 cm, reconstituted distally and patent to the ankle. Occlusion of the tibioperoneal trunk with collateral reconstitution of contiguous peroneal runoff. A diminutive posterior tibial artery is seen to above the ankle. Veins: No obvious venous abnormality within the limitations of this arterial phase study. Review of the MIP images confirms the above findings. NON-VASCULAR Hepatobiliary: No focal liver abnormality is seen. Status post cholecystectomy. No biliary dilatation. Pancreas: Unremarkable. No pancreatic ductal dilatation or surrounding inflammatory changes. Spleen: Normal in size without focal abnormality. Adrenals/Urinary Tract: Adrenals and kidneys unremarkable. Urinary bladder incompletely distended, mildly thick-walled. Stomach/Bowel: Stomach, small bowel unremarkable. Appendix  not visualized. No pericecal inflammatory/edematous change. The colon is nondistended, unremarkable. Lymphatic: No abdominal or pelvic adenopathy. A few subcentimeter left para-aortic and pericaval nodes. Reproductive: Uterus and bilateral adnexa are unremarkable. Other: No ascites.  No free air. Musculoskeletal: No acute or significant osseous findings. IMPRESSION: VASCULAR 1. 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 2. Mild aortoiliac  atherosclerosis (ICD10-170.0). NON-VASCULAR 1. No acute findings. Electronically Signed   By: Lucrezia Europe M.D.   On: 01/24/2019 16:33     Medications:   . sodium chloride    . sodium chloride 75 mL/hr at 01/25/19 1353  .  ceFAZolin (ANCEF) IV    . [MAR Hold]  ceFAZolin (ANCEF) IV    . heparin 750 Units/hr (01/25/19 0600)   . [MAR Hold] amLODipine  10 mg Oral Daily  . [MAR Hold] carvedilol  25 mg Oral BID WC  . [MAR Hold] nicotine  21 mg Transdermal Daily   [MAR Hold] acetaminophen, diphenhydrAMINE, [MAR Hold] docusate sodium, famotidine, [MAR Hold]  HYDROmorphone (DILAUDID) injection, methylPREDNISolone (SOLU-MEDROL) injection, midazolam, [MAR Hold]  morphine injection, [MAR Hold] ondansetron (ZOFRAN) IV, [MAR Hold] oxyCODONE-acetaminophen  Assessment/ Plan:  43 y.o. female with hypertension, congestive heart failure, HIV, hepatitis C admitted with LLE embolic disease.    1.  Acute renal failure/chronic kidney disease stage III baseline creatinine 1.7.the patient's presenting creatinine was found to be 2.22 and now down to 1.9.  Baseline creatinine appears to be 1.7.  Continue IV fluid hydration with 0.9 normal saline at 75 cc per hour.  Patient to be exposed to contrast today.  Continue to monitor renal parameters closely after vascular evaluation.  2.  Hypertension.blood pressure significantly improved and currently 106/80.  Maintain the patient on amlodipine and carvedilol.  Consider adding an ARB once  renal function is stabilized.      LOS: 1 Rebecca Bridges 8/12/20203:43 PM

## 2019-01-26 ENCOUNTER — Other Ambulatory Visit: Payer: Self-pay

## 2019-01-26 ENCOUNTER — Encounter: Payer: Self-pay | Admitting: Vascular Surgery

## 2019-01-26 DIAGNOSIS — N179 Acute kidney failure, unspecified: Secondary | ICD-10-CM

## 2019-01-26 DIAGNOSIS — I420 Dilated cardiomyopathy: Secondary | ICD-10-CM

## 2019-01-26 DIAGNOSIS — N183 Chronic kidney disease, stage 3 (moderate): Secondary | ICD-10-CM

## 2019-01-26 DIAGNOSIS — I743 Embolism and thrombosis of arteries of the lower extremities: Principal | ICD-10-CM

## 2019-01-26 DIAGNOSIS — I998 Other disorder of circulatory system: Secondary | ICD-10-CM

## 2019-01-26 DIAGNOSIS — F17200 Nicotine dependence, unspecified, uncomplicated: Secondary | ICD-10-CM

## 2019-01-26 LAB — BASIC METABOLIC PANEL
Anion gap: 4 — ABNORMAL LOW (ref 5–15)
BUN: 27 mg/dL — ABNORMAL HIGH (ref 6–20)
CO2: 24 mmol/L (ref 22–32)
Calcium: 7.5 mg/dL — ABNORMAL LOW (ref 8.9–10.3)
Chloride: 102 mmol/L (ref 98–111)
Creatinine, Ser: 1.83 mg/dL — ABNORMAL HIGH (ref 0.44–1.00)
GFR calc Af Amer: 39 mL/min — ABNORMAL LOW (ref >60)
GFR calc non Af Amer: 33 mL/min — ABNORMAL LOW (ref >60)
Glucose, Bld: 92 mg/dL (ref 70–99)
Potassium: 4.9 mmol/L (ref 3.5–5.1)
Sodium: 130 mmol/L — ABNORMAL LOW (ref 135–145)

## 2019-01-26 LAB — TROPONIN I (HIGH SENSITIVITY)
Troponin I (High Sensitivity): 13 ng/L (ref <18)
Troponin I (High Sensitivity): 13 ng/L (ref <18)

## 2019-01-26 LAB — LIPID PANEL
Cholesterol: 123 mg/dL (ref 0–200)
HDL: 34 mg/dL — ABNORMAL LOW (ref >40)
LDL Cholesterol: 61 mg/dL (ref 0–99)
Total CHOL/HDL Ratio: 3.6 RATIO
Triglycerides: 138 mg/dL (ref <150)
VLDL: 28 mg/dL (ref 0–40)

## 2019-01-26 LAB — HEMOGLOBIN A1C
Hgb A1c MFr Bld: 5.5 % (ref 4.8–5.6)
Mean Plasma Glucose: 111.15 mg/dL

## 2019-01-26 LAB — CBC
HCT: 35.3 % — ABNORMAL LOW (ref 36.0–46.0)
Hemoglobin: 11.5 g/dL — ABNORMAL LOW (ref 12.0–15.0)
MCH: 33.3 pg (ref 26.0–34.0)
MCHC: 32.6 g/dL (ref 30.0–36.0)
MCV: 102.3 fL — ABNORMAL HIGH (ref 80.0–100.0)
Platelets: 90 10*3/uL — ABNORMAL LOW (ref 150–400)
RBC: 3.45 MIL/uL — ABNORMAL LOW (ref 3.87–5.11)
RDW: 13.3 % (ref 11.5–15.5)
WBC: 2.7 10*3/uL — ABNORMAL LOW (ref 4.0–10.5)
nRBC: 0 % (ref 0.0–0.2)

## 2019-01-26 LAB — HEPARIN LEVEL (UNFRACTIONATED)
Heparin Unfractionated: 0.51 IU/mL (ref 0.30–0.70)
Heparin Unfractionated: 0.52 IU/mL (ref 0.30–0.70)

## 2019-01-26 LAB — TSH: TSH: 2.831 u[IU]/mL (ref 0.350–4.500)

## 2019-01-26 LAB — MAGNESIUM: Magnesium: 1.9 mg/dL (ref 1.7–2.4)

## 2019-01-26 MED ORDER — CLOPIDOGREL BISULFATE 75 MG PO TABS: 75.0000 mg | ORAL_TABLET | Freq: Every day | ORAL | Status: DC

## 2019-01-26 MED ORDER — HYDRALAZINE HCL 25 MG PO TABS
25.0000 mg | ORAL_TABLET | Freq: Three times a day (TID) | ORAL | Status: DC
  Administered 2019-01-26 – 2019-01-27 (×4): 25 mg via ORAL
  Filled 2019-01-26 (×4): qty 1

## 2019-01-26 MED ORDER — APIXABAN 5 MG PO TABS
5.0000 mg | ORAL_TABLET | Freq: Two times a day (BID) | ORAL | Status: DC
  Administered 2019-01-26 – 2019-01-27 (×3): 5 mg via ORAL
  Filled 2019-01-26 (×3): qty 1

## 2019-01-26 MED ORDER — APIXABAN 5 MG PO TABS: 5.0000 mg | ORAL_TABLET | Freq: Two times a day (BID) | ORAL | 1 refills | Status: DC

## 2019-01-26 MED ORDER — CLOPIDOGREL BISULFATE 75 MG PO TABS: 75.0000 mg | ORAL_TABLET | Freq: Every day | ORAL | 1 refills | Status: DC

## 2019-01-26 MED ORDER — HYDRALAZINE HCL 25 MG PO TABS: 25.0000 mg | ORAL_TABLET | Freq: Three times a day (TID) | ORAL | 0 refills | Status: DC

## 2019-01-26 NOTE — Progress Notes (Signed)
[facility] NAME: Rebecca Bridges    MR#:  [id number]  DATE OF BIRTH:  01/11/1976  SUBJECTIVE:  CHIEF COMPLAINT:   Chief Complaint  Patient presents with  . Leg Pain   The patient has no leg pain.  Heparin drip is changed to Eliquis. REVIEW OF SYSTEMS:  Review of Systems  Constitutional: Negative for chills, fever and malaise/fatigue.  HENT: Negative for sore throat.   Eyes: Negative for blurred vision and double vision.  Respiratory: Negative for cough, hemoptysis, shortness of breath, wheezing and stridor.   Cardiovascular: Negative for chest pain, palpitations, orthopnea and leg swelling.  Gastrointestinal: Negative for abdominal pain, blood in stool, diarrhea, melena, nausea and vomiting.  Genitourinary: Negative for dysuria, flank pain and hematuria.  Musculoskeletal: Negative for back pain and joint pain.       .  Skin: Negative for rash.  Neurological: Negative for dizziness, sensory change, focal weakness, seizures, loss of consciousness, weakness and headaches.  Endo/Heme/Allergies: Negative for polydipsia.  Psychiatric/Behavioral: Negative for depression. The patient is not nervous/anxious.     DRUG ALLERGIES:   Allergies  Allergen Reactions  . Sulfa Antibiotics Rash   VITALS:  Blood pressure 108/83, pulse 71, temperature 98.3 F (36.8 C), temperature source Oral, resp. rate 20, height 5\' 3"  (1.6 m), weight 52.2 kg, last menstrual period 01/14/2019, SpO2 100 %. PHYSICAL EXAMINATION:  Physical Exam Constitutional:      General: She is not in acute distress. HENT:     Head: Normocephalic.     Mouth/Throat:     Mouth: Mucous membranes are moist.  Eyes:     General: No scleral icterus.    Conjunctiva/sclera: Conjunctivae normal.     Pupils: Pupils are equal, round, and reactive to light.  Neck:     Musculoskeletal: Normal range of motion and neck supple.     Vascular: No JVD.     Trachea: No tracheal  deviation.  Cardiovascular:     Rate and Rhythm: Normal rate and regular rhythm.     Pulses: Normal pulses.     Heart sounds: Normal heart sounds. No murmur. No gallop.   Pulmonary:     Effort: Pulmonary effort is normal. No respiratory distress.     Breath sounds: Normal breath sounds. No wheezing or rales.  Abdominal:     General: Bowel sounds are normal. There is no distension.     Palpations: Abdomen is soft.     Tenderness: There is no abdominal tenderness. There is no rebound.  Musculoskeletal: Normal range of motion.        General: No tenderness.     Right lower leg: No edema.     Left lower leg: No edema.  Skin:    Findings: No erythema or rash.  Neurological:     Mental Status: She is alert and oriented to person, place, and time.     Cranial Nerves: No cranial nerve deficit.  Psychiatric:        Mood and Affect: Mood normal.    LABORATORY PANEL:  Female CBC Recent Labs  Lab 01/26/19 0035  WBC 2.7*  HGB 11.5*  HCT 35.3*  PLT 90*   ------------------------------------------------------------------------------------------------------------------ Chemistries  Recent Labs  Lab 01/24/19 0912  01/26/19 0035  NA 129*   < > 130*  K 4.4   < > 4.9  CL 103   < > 102  CO2 21*   < > 24  GLUCOSE 94   < >  92  BUN 43*   < > 27*  CREATININE 2.22*   < > 1.83*  CALCIUM 8.2*   < > 7.5*  MG  --   --  1.9  AST 35  --   --   ALT 19  --   --   ALKPHOS 63  --   --   BILITOT 0.6  --   --    < > = values in this interval not displayed.   RADIOLOGY:  No results found. ASSESSMENT AND PLAN:   Arterial embolization to the tibial vessels left lower extremity. She is treated with heparin IV drip.  Changed to Eliquis.  PRN pain medication for support. CT angiogram  1. 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 2. Mild aortoiliac  atherosclerosis Dr. Clinton Quant a cardiac etiology.   Cardiology consult and  echocardiogram.  Chronic systolic CHF with ejection fraction 40% to 50%. Stable.  Diuretics is on hold due to acute on chronic renal failure.  *Back pain Resolved.  *Acute on CKD stage III. Baseline creatinine appears to be around 1.5. Improving with IV fluid Hold hydrochlorothiazide and lisinopril. Renal ultrasound did not report hydronephrosis.  Hyponatremia.  Treated with normal saline.  Looks like chronic.  *Hypertension Continue amlodipine and carvedilol. Hold hydrochlorothiazide and lisinopril.  *Active smoking Counseled to quit smoking for 4 minutes and offered nicotine patch.  Leukopenia.  Stable.  Unclear etiology.  May follow-up hematologist as outpatient.  All the records are reviewed and case discussed with Care Management/Social Worker. Management plans discussed with the patient, family and they are in agreement.  CODE STATUS: Full Code  TOTAL TIME TAKING CARE OF THIS PATIENT: 27 minutes.   More than 50% of the time was spent in counseling/coordination of care: YES  POSSIBLE D/C IN 2 DAYS, DEPENDING ON CLINICAL CONDITION.   Demetrios Loll M.D on 01/26/2019 at 3:08 PM  Between 7am to 6pm - Pager - [phone]  After 6pm go to [email or web address] - [facility]

## 2019-01-26 NOTE — TOC Initial Note (Signed)
Transition of Care [facility]) - Initial/Assessment Note    Patient Details  Name: Rebecca Bridges MRN: [id number] Date of Birth: 10-22-1975  Transition of [facility]) CM/SW Contact:    Beverly Sessions, RN Phone Number: 01/26/2019, 4:03 PM  Clinical Narrative:                 Patient status post Arterial embolization to the tibial vessels left lower extremity.  Initially plan was for patient to discharge today on Eliquis and Plavix.  RNCM confirmed both of these medications are available at [facility]  And faxed in prescriptions.   Due to Cardiology recommendations discharge was cancelled for today.  RNCM did not obtain medications from [facility]  In the event that discharge medications change prior to discharge.  RNCM will reassess   Patient states that she does not have a PCP.  She is willing to be set up with local sliding scale clinic.  Added this information to follow up section for appointment to be made at [facility] or "sister" clinic prior to discharge.   RNCM explained to the patient that we would obtain her first 30 days of medication from [facility] , however she would need a PCP in order to continue to follow her to write for any additional medication or refills.       Expected Discharge Plan: Home/Self Care Barriers to Discharge: Continued Medical Work up   Patient Goals and CMS Choice        Expected Discharge Plan and Services Expected Discharge Plan: Home/Self Care   Discharge Planning Services: CM Consult     Expected Discharge Date: 01/26/19                                    Prior Living Arrangements/Services     Patient language and need for interpreter reviewed:: Yes Do you feel safe going back to the place where you live?: Yes            Criminal Activity/Legal Involvement Pertinent to Current Situation/Hospitalization: No - Comment as needed  Activities of Daily Living Home Assistive  Devices/Equipment: None ADL Screening (condition at time of admission) Patient's cognitive ability adequate to safely complete daily activities?: Yes Is the patient deaf or have difficulty hearing?: No Does the patient have difficulty seeing, even when wearing glasses/contacts?: No Does the patient have difficulty concentrating, remembering, or making decisions?: No Patient able to express need for assistance with ADLs?: Yes Does the patient have difficulty dressing or bathing?: No Independently performs ADLs?: Yes (appropriate for developmental age) Does the patient have difficulty walking or climbing stairs?: No Weakness of Legs: Left Weakness of Arms/Hands: None  Permission Sought/Granted                  Emotional Assessment       Orientation: : Oriented to Self, Oriented to Place, Oriented to  Time, Oriented to Situation   Psych Involvement: No (comment)  Admission diagnosis:  CHF (congestive heart failure) (HCC) [I50.9] Left leg pain [M79.605] Acute on chronic renal failure ([place]) [N17.9, N18.9] Patient Active Problem List   Diagnosis Date Noted  . Left leg pain 01/24/2019  . Acute on chronic renal failure ([place]) 01/24/2019  . HTN (hypertension), malignant 08/07/2017   PCP:  Patient, No Pcp Per Pharmacy:   [facility] [place], [address] [place]  [address] [place] [address] Phone: [phone] Fax: [phone]  [facility] [id number], [place] [address] [place] [address] Phone: [phone] Fax: [phone]     Social Determinants of Health (SDOH) Interventions    Readmission Risk Interventions No flowsheet data found.

## 2019-01-26 NOTE — Consult Note (Signed)
Cardiology Consultation:   Patient ID: Rebecca Bridges MRN: [id number]; DOB: 09/14/1975  Admit date: 01/24/2019 Date of Consult: 01/26/2019  Primary Care Provider: Patient, No Pcp Per Primary Cardiologist: [facility], Dr. Rockey Situ Primary Electrophysiologist:  None    Patient Profile:   Rebecca Bridges is a 43 y.o. female with a hx of congestive heart failure with reduced ejection fraction, HTN, current smoker (1 pack daily for 2 days), hepatitis A, noncompliant/chronic renal insufficiency,  who is being seen today for the evaluation of cardiac etiology of lower extremity thrombotic event at the request of Dr. Bridgett Larsson.  History of Present Illness:   Rebecca Bridges is a 43 yo female with PMH as above.  She has a current history of smoking at 1 pack every 2 days; however, she reported that she intends to quit and stay on the nicotine patch.  She reports occasional alcohol use with 3-4 shots/ day once every other week.  She was seen 08/07/2017 by [facility] cardiology for complaints of vague left chest pressure that improved with activity and ambulation but felt worse at rest.  She denied shortness of breath or diaphoresis.  EKG showed possible left bundle branch block, which was presumed to be new.  Echocardiogram was performed and showed mild concentric hypertrophy with EF reduced 45% to 50%.  No regional wall motion abnormalities.  Doppler parameters were consistent with abnormal left ventricular relaxation (G1 DD).  She was not felt to need a cardiac catheterization that admission.  Recommendations were to quit smoking and consider a functional study as an inpatient or outpatient.  Since that time, patient denied chest pain, shortness of breath, dyspnea on exertion, presyncope, or syncope.  She reported that she sometimes feels her heart racing at rest for an unknown amount of time with associated palpitations or "fluttering."  This racing HR and palpitations used to occur frequently; however, since restarting her blood  pressure medications, she felt the frequency of racing HR/palpitations had decreased to once every 3 weeks. She denied associated chest pain, presyncope, syncope, or SOB/DOE with her racing HR and palpitations. She reported she has never been told that she has atrial fibrillation or an abnormal heart rhythm. She did note her snoring occasionally wakes her from sleep but is uncertain if she stops breathing at night. She never has had a sleep study performed and has no known h/o OSA. She reported she has not followed with a cardiologist, as well as intermittent noncompliance with her antihypertensive medications; however, she denied worsening heart failure symptoms. No reported orthopnea, LEE, abdominal distention, SOB, DOE, or early satiety. She reported she does not exercise regularly or make conscious diet changes. Rebecca Bridges been compliant with her antihypertensives since restarting them earlier in August 2020.   On 01/24/2019, she presented to [facility] ER with onset of progressive L foot pain with associated coolness. The pain was not new for her but had become significantly worse since earlier that morning. No reported chest pain, SOB, racing HR, palpitations, presyncope, or syncope.  Duplex US of lower extremities was performed and negative for bilateral lower extremities.  In the ED, she also had severe back pain and underwent a CTA of chest, abdomen, and pelvis without acute findings. CTA with runoff showed occlusion of the posterior tibial artery, as well as the tibioperoneal trunk.. She was seen by vascular surgery for ischemia of the left lower extremity, which was thought likely secondary to embolic disease. Given the CT scans were without a more proximal arterial source of the occlusion,  suspicion was for cardiac etiology.  On 8/13, she underwent thromboembolectomy and percutaneous transluminal angioplasty of the left anterior tibial artery to 3 mm.  She was then transitioned from heparin to Eliquis 5 mg twice  daily and considered to be stable from a vascular standpoint. Cardiology was then consulted for further evaluation regarding possible cardiac etiology of the arterial embolization.   Today, the patient continues to deny CP. No recent racing HR or palpitations. No SOB or DOE with ambulation earlier in the day. EKG was performed and NSR, 76 bpm, LAD, severe LVH (progressed from earlier LAD with IVCD), and changes associated with repolarization but no acute ST/T changes from previous EKG. Echo ordered and pending. HS Tn negative at 13.    Heart Pathway Score:     Past Medical History:  Diagnosis Date   CHF (congestive heart failure) (HCC)    Chronic kidney disease    Hypertension     Past Surgical History:  Procedure Laterality Date   CHOLECYSTECTOMY     LOWER EXTREMITY ANGIOGRAPHY Left 01/25/2019   Procedure: Lower Extremity Angiography;  Surgeon: Katha Cabal, MD;  Location: [facility];  Service: Cardiovascular;  Laterality: Left;     Home Medications:  Prior to Admission medications   Medication Sig Start Date End Date Taking? Authorizing Provider  amLODipine (NORVASC) 10 MG tablet Take 1 tablet (10 mg total) by mouth daily. 01/08/19  Yes Triplett, Cari B, FNP  carvedilol (COREG) 25 MG tablet Take 1 tablet (25 mg total) by mouth 2 (two) times daily with a meal. 05/05/18  Yes Lisa Roca, MD  hydrochlorothiazide (HYDRODIURIL) 12.5 MG tablet Take 1 tablet (12.5 mg total) by mouth daily. 01/08/19  Yes Triplett, Cari B, FNP  lisinopril (ZESTRIL) 40 MG tablet Take 1 tablet (40 mg total) by mouth daily. 01/08/19  Yes Triplett, Cari B, FNP  apixaban (ELIQUIS) 5 MG TABS tablet Take 1 tablet (5 mg total) by mouth 2 (two) times daily. 01/26/19   Demetrios Loll, MD  clopidogrel (PLAVIX) 75 MG tablet Take 1 tablet (75 mg total) by mouth daily with breakfast. 01/27/19   Demetrios Loll, MD  hydrALAZINE (APRESOLINE) 25 MG tablet Take 1 tablet (25 mg total) by mouth every 8 (eight) hours.  01/26/19   Demetrios Loll, MD    Inpatient Medications: Scheduled Meds:  amLODipine  10 mg Oral Daily   apixaban  5 mg Oral BID   carvedilol  25 mg Oral BID WC   clopidogrel  75 mg Oral Q breakfast   hydrALAZINE  25 mg Oral Q8H   nicotine  21 mg Transdermal Daily   sodium chloride flush  3 mL Intravenous Q12H   Continuous Infusions:  sodium chloride     PRN Meds: sodium chloride, acetaminophen, docusate sodium, HYDROmorphone (DILAUDID) injection, morphine injection, morphine injection, ondansetron (ZOFRAN) IV, oxyCODONE, oxyCODONE-acetaminophen, sodium chloride flush  Allergies:    Allergies  Allergen Reactions   Sulfa Antibiotics Rash    Social History:   Social History   Socioeconomic History   Marital status: Single    Spouse name: Not on file   Number of children: Not on file   Years of education: Not on file   Highest education level: Not on file  Occupational History   Not on file  Social Needs   Financial resource strain: Not on file   Food insecurity    Worry: Not on file    Inability: Not on file   Transportation needs  Medical: Not on file    Non-medical: Not on file  Tobacco Use   Smoking status: Current Every Day Smoker   Smokeless tobacco: Never Used  Substance and Sexual Activity   Alcohol use: No    Frequency: Never   Drug use: No   Sexual activity: Not on file  Lifestyle   Physical activity    Days per week: Not on file    Minutes per session: Not on file   Stress: Not on file  Relationships   Social connections    Talks on phone: Not on file    Gets together: Not on file    Attends religious service: Not on file    Active member of club or organization: Not on file    Attends meetings of clubs or organizations: Not on file    Relationship status: Not on file   Intimate partner violence    Fear of current or ex partner: Not on file    Emotionally abused: Not on file    Physically abused: Not on file    Forced  sexual activity: Not on file  Other Topics Concern   Not on file  Social History Narrative   Not on file    Family History:    Family History  Problem Relation Age of Onset   Hypertension Mother      ROS:  Please see the history of present illness.  Review of Systems  Constitutional: Negative for diaphoresis.  Respiratory: Negative for hemoptysis and shortness of breath.   Cardiovascular: Negative for chest pain, palpitations, orthopnea and leg swelling.  Gastrointestinal: Negative for abdominal pain, blood in stool, melena, nausea and vomiting.  Musculoskeletal: Negative for falls.  Neurological: Negative for dizziness, loss of consciousness and weakness.  All other systems reviewed and are negative.   All other ROS reviewed and negative.     Physical Exam/Data:   Vitals:   01/25/19 2016 01/26/19 0503 01/26/19 0800 01/26/19 1231  BP: 103/79 (!) 120/94 (!) 131/100 108/83  Pulse: 75 75  71  Resp: 18 18  20   Temp: 98.6 F (37 C) 98.1 F (36.7 C)  98.3 F (36.8 C)  TempSrc: Oral Oral  Oral  SpO2: 100% 100%  100%  Weight:      Height:        Intake/Output Summary (Last 24 hours) at 01/26/2019 1540 Last data filed at 01/26/2019 0300 Gross per 24 hour  Intake 2844.27 ml  Output --  Net 2844.27 ml   Last 3 Weights 01/24/2019 01/08/2019 11/21/2018  Weight (lbs) 115 lb 115 lb 120 lb  Weight (kg) 52.164 kg 52.164 kg 54.432 kg     Body mass index is 20.37 kg/m.  General:  Well nourished, well developed, in no acute distress HEENT: normal Neck: no JVD Vascular: No carotid bruits; radial pulses 1+ bilaterally Cardiac:  normal S1, S2; RRR; no murmur  Lungs:  clear to auscultation bilaterally, no wheezing, rhonchi or rales  Abd: soft, nontender, no hepatomegaly  Ext: no edema Musculoskeletal:  No deformities, BUE and BLE strength normal and equal Skin: warm and dry  Neuro:  No focal abnormalities noted Psych:  Normal affect   EKG:  The EKG was personally reviewed  and demonstrates:  EKG was performed and NSR, 76 bpm, LAD, severe LVH (progressed from earlier LAD with IVCD), and changes associated with repolarization but no acute ST/T changes from previous EKG.   Telemetry:  Telemetry was personally reviewed and demonstrates:  Not on telemetry.  Nurses currently putting on telemetry.  Relevant CV Studies: Updated echo pending  TTE 08/08/2017 Study Conclusions - Left ventricle: The cavity size was normal. There was mild   concentric hypertrophy. Systolic function was mildly reduced. The   estimated ejection fraction was in the range of 45% to 50%. Wall   motion was normal; there were no regional wall motion   abnormalities. Doppler parameters are consistent with abnormal   left ventricular relaxation (grade 1 diastolic dysfunction). - Right ventricle: Systolic function was normal. - Pulmonary arteries: Systolic pressure could not be accurately   estimated.  Laboratory Data:  High Sensitivity Troponin:   Recent Labs  Lab 01/08/19 1431  TROPONINIHS 18*     Cardiac EnzymesNo results for input(s): TROPONINI in the last 168 hours. No results for input(s): TROPIPOC in the last 168 hours.  Chemistry Recent Labs  Lab 01/24/19 0912 01/25/19 0503 01/26/19 0035  NA 129* 130* 130*  K 4.4 4.7 4.9  CL 103 101 102  CO2 21* 22 24  GLUCOSE 94 99 92  BUN 43* 33* 27*  CREATININE 2.22* 1.90* 1.83*  CALCIUM 8.2* 7.6* 7.5*  GFRNONAA 26* 32* 33*  GFRAA 31* 37* 39*  ANIONGAP 5 7 4*    Recent Labs  Lab 01/24/19 0912  PROT 9.8*  ALBUMIN 3.4*  AST 35  ALT 19  ALKPHOS 63  BILITOT 0.6   Hematology Recent Labs  Lab 01/24/19 0912 01/25/19 0503 01/26/19 0035  WBC 4.4 2.1* 2.7*  RBC 3.84* 3.61* 3.45*  HGB 12.8 11.9* 11.5*  HCT 39.0 36.7 35.3*  MCV 101.6* 101.7* 102.3*  MCH 33.3 33.0 33.3  MCHC 32.8 32.4 32.6  RDW 13.4 13.3 13.3  PLT 108* 86* 90*   BNP Recent Labs  Lab 01/24/19 1123  BNP 854.0*    DDimer No results for input(s): DDIMER  in the last 168 hours.   Radiology/Studies:  Dg Chest 2 View  Result Date: 01/25/2019 CLINICAL DATA:  CHF EXAM: CHEST - 2 VIEW COMPARISON:  Chest CT from yesterday FINDINGS: Cardiomegaly. There is no edema, consolidation, effusion, or pneumothorax. Cholecystectomy clips. IMPRESSION: Cardiomegaly without failure. Electronically Signed   By: Monte Fantasia M.D.   On: 01/25/2019 07:10   Ct Angio Aortobifemoral W And/or Wo Contrast  Result Date: 01/24/2019 CLINICAL DATA:  Chest pain.  Left foot pain. EXAM: CT ANGIOGRAM CHEST CT ANGIOGRAPHY OF ABDOMINAL AORTA WITH ILIOFEMORAL RUNOFF TECHNIQUE: Multidetector CT imaging of the chest, abdomen, pelvis and lower extremities was performed using the standard protocol during bolus administration of intravenous contrast. Multiplanar CT image reconstructions and MIPs were obtained to evaluate the vascular anatomy. CONTRAST:  115mL OMNIPAQUE IOHEXOL 350 MG/ML SOLN COMPARISON:  08/07/2017 FINDINGS: CTA CHEST FINDINGS Cardiovascular: Mild cardiomegaly with suspected left ventricular enlargement. No pericardial effusion. The RV is nondilated. Satisfactory opacification of pulmonary arteries noted, and there is no evidence of pulmonary emboli. Adequate contrast opacification of the thoracic aorta with no evidence of dissection, aneurysm, or stenosis. There is classic 3-vessel brachiocephalic arch anatomy without proximal stenosis. Mild atheromatous plaque in the descending thoracic aorta. Mediastinum/Nodes: No hilar or mediastinal adenopathy. Lungs/Pleura: No pleural effusion. No pneumothorax. Lungs are clear. Musculoskeletal: No chest wall abnormality. No acute or significant osseous findings. Review of the MIP images confirms the above findings. CTA ABDOMEN AND PELVIS FINDINGS VASCULAR Aorta: Mild calcified atheromatous plaque. No dissection, aneurysm, or stenosis. Celiac: Patent without evidence of aneurysm, dissection, vasculitis or significant stenosis. SMA: Patent  without evidence of aneurysm, dissection, vasculitis or significant  stenosis. Renals: Both renal arteries are patent without evidence of aneurysm, dissection, vasculitis, fibromuscular dysplasia or significant stenosis. IMA: Patent without evidence of aneurysm, dissection, vasculitis or significant stenosis. RIGHT Lower Extremity Inflow: Mild nonocclusive plaque in the common and internal iliac arteries. External iliac widely patent. No aneurysm or stenosis. Outflow: Common femoral patent Deep femoral branches patent SFA patent Popliteal patent Runoff: Dominant posterior tibial runoff across the ankle. Peroneal is seen to the ankle. Anterior tibial is diminutive, not seen beyond the lower calf. LEFT Lower Extremity Inflow: Minimal calcified plaque.  No aneurysm or stenosis. Outflow: Common femoral patent. Deep femoral branches patent. SFA patent Popliteal patent Runoff: Anterior tibial origin occlusion over approximately 2 cm, reconstituted distally and patent to the ankle. Occlusion of the tibioperoneal trunk with collateral reconstitution of contiguous peroneal runoff. A diminutive posterior tibial artery is seen to above the ankle. Veins: No obvious venous abnormality within the limitations of this arterial phase study. Review of the MIP images confirms the above findings. NON-VASCULAR Hepatobiliary: No focal liver abnormality is seen. Status post cholecystectomy. No biliary dilatation. Pancreas: Unremarkable. No pancreatic ductal dilatation or surrounding inflammatory changes. Spleen: Normal in size without focal abnormality. Adrenals/Urinary Tract: Adrenals and kidneys unremarkable. Urinary bladder incompletely distended, mildly thick-walled. Stomach/Bowel: Stomach, small bowel unremarkable. Appendix not visualized. No pericecal inflammatory/edematous change. The colon is nondistended, unremarkable. Lymphatic: No abdominal or pelvic adenopathy. A few subcentimeter left para-aortic and pericaval nodes.  Reproductive: Uterus and bilateral adnexa are unremarkable. Other: No ascites.  No free air. Musculoskeletal: No acute or significant osseous findings. IMPRESSION: VASCULAR 1. 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 2. Mild aortoiliac  atherosclerosis (ICD10-170.0). NON-VASCULAR 1. No acute findings. Electronically Signed   By: Lucrezia Europe M.D.   On: 01/24/2019 16:33   US Renal  Result Date: 01/24/2019 CLINICAL DATA:  Acute on chronic renal failure EXAM: RENAL / URINARY TRACT ULTRASOUND COMPLETE COMPARISON:  August 09, 2017 FINDINGS: Right Kidney: Renal measurements: 9.0 x 3.8 x 5.3 cm = volume: 95 mL. There is increased echogenicity seen throughout the renal parenchyma. No mass or hydronephrosis. Left Kidney: Renal measurements: 9.8 x 4.5 x 4.7 cm = volume: 106 mL. There is increased echogenicity within the renal parenchyma. No mass or hydronephrosis. Bladder: Appears normal for degree of bladder distention. Normal bilateral ureteral jets. IMPRESSION: Findings which could be suggestive of acute kidney injury. No hydronephrosis. Electronically Signed   By: Prudencio Pair M.D.   On: 01/24/2019 17:10   US Venous Img Lower Unilateral Left  Result Date: 01/24/2019 CLINICAL DATA:  Pain EXAM: LEFT LOWER EXTREMITY VENOUS DOPPLER ULTRASOUND TECHNIQUE: Gray-scale sonography with compression, as well as color and duplex ultrasound, were performed to evaluate the deep venous system from the level of the common femoral vein through the popliteal and proximal calf veins. COMPARISON:  None FINDINGS: Normal compressibility of the common femoral, superficial femoral, and popliteal veins, as well as the proximal calf veins. No filling defects to suggest DVT on grayscale or color Doppler imaging. Doppler waveforms show normal direction of venous flow, normal respiratory phasicity and response to augmentation. Survey views of the contralateral common femoral vein are  unremarkable. IMPRESSION: No femoropopliteal and no calf DVT in the visualized calf veins. If clinical symptoms are inconsistent or if there are persistent or worsening symptoms, further imaging (possibly involving the iliac veins) may be warranted. Electronically Signed   By: Lucrezia Europe M.D.   On: 01/24/2019 10:07   Dg Chest  Portable 1 View  Result Date: 01/24/2019 CLINICAL DATA:  Congestive heart failure EXAM: PORTABLE CHEST 1 VIEW COMPARISON:  01/08/2019 FINDINGS: Cardiomegaly. Pulmonary vasculature is within normal limits. No focal consolidation. No pleural effusion. No pneumothorax. No acute osseous findings. IMPRESSION: Cardiomegaly.  Lungs are clear. Electronically Signed   By: Davina Poke M.D.   On: 01/24/2019 11:04   Ct Angio Chest/abd/pel For Dissection W And/or Wo Contrast  Result Date: 01/24/2019 CLINICAL DATA:  Chest pain.  Left foot pain. EXAM: CT ANGIOGRAM CHEST CT ANGIOGRAPHY OF ABDOMINAL AORTA WITH ILIOFEMORAL RUNOFF TECHNIQUE: Multidetector CT imaging of the chest, abdomen, pelvis and lower extremities was performed using the standard protocol during bolus administration of intravenous contrast. Multiplanar CT image reconstructions and MIPs were obtained to evaluate the vascular anatomy. CONTRAST:  171mL OMNIPAQUE IOHEXOL 350 MG/ML SOLN COMPARISON:  08/07/2017 FINDINGS: CTA CHEST FINDINGS Cardiovascular: Mild cardiomegaly with suspected left ventricular enlargement. No pericardial effusion. The RV is nondilated. Satisfactory opacification of pulmonary arteries noted, and there is no evidence of pulmonary emboli. Adequate contrast opacification of the thoracic aorta with no evidence of dissection, aneurysm, or stenosis. There is classic 3-vessel brachiocephalic arch anatomy without proximal stenosis. Mild atheromatous plaque in the descending thoracic aorta. Mediastinum/Nodes: No hilar or mediastinal adenopathy. Lungs/Pleura: No pleural effusion. No pneumothorax. Lungs are clear.  Musculoskeletal: No chest wall abnormality. No acute or significant osseous findings. Review of the MIP images confirms the above findings. CTA ABDOMEN AND PELVIS FINDINGS VASCULAR Aorta: Mild calcified atheromatous plaque. No dissection, aneurysm, or stenosis. Celiac: Patent without evidence of aneurysm, dissection, vasculitis or significant stenosis. SMA: Patent without evidence of aneurysm, dissection, vasculitis or significant stenosis. Renals: Both renal arteries are patent without evidence of aneurysm, dissection, vasculitis, fibromuscular dysplasia or significant stenosis. IMA: Patent without evidence of aneurysm, dissection, vasculitis or significant stenosis. RIGHT Lower Extremity Inflow: Mild nonocclusive plaque in the common and internal iliac arteries. External iliac widely patent. No aneurysm or stenosis. Outflow: Common femoral patent Deep femoral branches patent SFA patent Popliteal patent Runoff: Dominant posterior tibial runoff across the ankle. Peroneal is seen to the ankle. Anterior tibial is diminutive, not seen beyond the lower calf. LEFT Lower Extremity Inflow: Minimal calcified plaque.  No aneurysm or stenosis. Outflow: Common femoral patent. Deep femoral branches patent. SFA patent Popliteal patent Runoff: Anterior tibial origin occlusion over approximately 2 cm, reconstituted distally and patent to the ankle. Occlusion of the tibioperoneal trunk with collateral reconstitution of contiguous peroneal runoff. A diminutive posterior tibial artery is seen to above the ankle. Veins: No obvious venous abnormality within the limitations of this arterial phase study. Review of the MIP images confirms the above findings. NON-VASCULAR Hepatobiliary: No focal liver abnormality is seen. Status post cholecystectomy. No biliary dilatation. Pancreas: Unremarkable. No pancreatic ductal dilatation or surrounding inflammatory changes. Spleen: Normal in size without focal abnormality. Adrenals/Urinary Tract:  Adrenals and kidneys unremarkable. Urinary bladder incompletely distended, mildly thick-walled. Stomach/Bowel: Stomach, small bowel unremarkable. Appendix not visualized. No pericecal inflammatory/edematous change. The colon is nondistended, unremarkable. Lymphatic: No abdominal or pelvic adenopathy. A few subcentimeter left para-aortic and pericaval nodes. Reproductive: Uterus and bilateral adnexa are unremarkable. Other: No ascites.  No free air. Musculoskeletal: No acute or significant osseous findings. IMPRESSION: VASCULAR 1. 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 2. Mild aortoiliac  atherosclerosis (ICD10-170.0). NON-VASCULAR 1. No acute findings. Electronically Signed   By: Lucrezia Europe M.D.   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 No telemetry to evaluate for arrhythmia including atrial fibrillation or flutter. Nurse instruction provided to place on telemetry.  --If no acute findings on telemetry or updated echo this admission, will plan for outpatient Zio monitor for one month total, given patient reported she only experiences this feeling 1x every 3 weeks since starting back on her antihypertensive medications. Placement of Zio will also allow for assessment of Afib burden with updated echo pending. If no acute changes or reduction of EF on echo, can likely discharge. TSH 2.831.  --Stressed compliance on Eliquis 5mg  BID (started on by vascular surgery given recent LLE occlusion with etiology unknown). CHA2DS2VASc score of at least 4 (CHF, HTN, vascular, female); therefore, recommend continue Eliquis 5mg  BID for both PVD and as suspected atrial fibrillation. Continue rate control with Coreg and amlodipine. With breakthrough palpitations, consider transition from amlodipine to Cardizem. Will schedule for outpatient  follow-up with [facility] for 2 weeks after completion of Zio to allow for processing.   HFrEF (EF 45-50%, 2019) --No SOB/DOE or volume overload. Previous echo showed mild reduced EF, concentric hypertrophy, G1DD. Updated echo pending. --No known history of CAD. EKG shows severe LVH and repolarization changes with IVCD progressed to LBBB but no acute changes from previous EKG. HS Tn negative. No chest pain this admission.LDL 61, HDL 34, TG 138, total cholesterol 123; Hgb A1C pending for further risk stratification.  --Given risk factors for CAD (including current smoker, vascular disease, uncontrolled HTN), as well as slightly reduced EF on previous echo, recommend future ischemic workup as outpatient. Risk factor modification recommended, including control of HTN with medication compliance. Increase activity level, dietary changes also discussed.  --Continue medical management with BB and ACE. No ASA as started on Plavix (in addition to Eliquis) by vascular surgery for PVD.  In future, consider addition of low dose statin given low HDL. Medication compliance stressed. --If no acute structural findings or decrease in EF on echo, recommend outpatient follow-up as above and outpatient stress test as above.   HTN --Well controlled at this time. Compliance with medications stressed --Continue amlodipine 10mg  daily, Coreg 25mg  BID, HCTZ 12.5mg  daily, lisinopril 40mg  daily. Recommend close monitoring of renal function and potassium / electrolytes on ACE and HCTZ. Consider transition from amlodipine to Cardizem given breakthrough palpitations. Follow-up in office as above.  CKD --Daily BMET. Monitor renal function / K on ACE and HCTZ. BP control strongly recommended (and medication compliance). Cr 1.83, which is around her baseline. K 4.9 and should be monitored. Mg 1.9 with goal 2.0.  Anemia --Consider anemia of chronic disease. Monitor on Plavix and Eliquis. Hgb 11.5 with baseline recently Hgb  12.0-13.0. Daily CBC.  Current smoker --Cessation advised. Patient wants to quit and intends to remain on nicotine patch.   For questions or updates, please contact [place] Please consult [email or web address] for contact info under     Signed, Arvil Chaco, PA-C  01/26/2019 3:40 PM

## 2019-01-26 NOTE — Progress Notes (Signed)
[facility]  ROUNDING NOTE   Subjective:  Patient now off IV fluids. Creatinine is improved slightly to 1.83. Denies any left lower extremity pain at the moment.  Objective:  Vital signs in last 24 hours:  Temp:  [97.5 F (36.4 C)-98.6 F (37 C)] 98.1 F (36.7 C) (08/13 0503) Pulse Rate:  [62-75] 75 (08/13 0503) Resp:  [11-20] 18 (08/13 0503) BP: (91-131)/(69-100) 131/100 (08/13 0800) SpO2:  [98 %-100 %] 100 % (08/13 0503)  Weight change:  Filed Weights   01/24/19 0900  Weight: 52.2 kg    Intake/Output: I/O last 3 completed shifts: In: 4049.3 [P.O.:120; I.V.:2474.9; IV Piggyback:1454.4] Out: -    Intake/Output this shift:  No intake/output data recorded.  Physical Exam: General: No acute distress  Head: Normocephalic, atraumatic. Moist oral mucosal membranes  Eyes: Anicteric  Neck: Supple, trachea midline  Lungs:  Clear to auscultation, normal effort  Heart: S1S2 no rubs  Abdomen:  Soft, nontender, bowel sounds present  Extremities: No peripheral edema.  Neurologic: Awake, alert, following commands  Skin: No lesions       Basic Metabolic Panel: Recent Labs  Lab 01/24/19 0912 01/25/19 0503 01/26/19 0035  NA 129* 130* 130*  K 4.4 4.7 4.9  CL 103 101 102  CO2 21* 22 24  GLUCOSE 94 99 92  BUN 43* 33* 27*  CREATININE 2.22* 1.90* 1.83*  CALCIUM 8.2* 7.6* 7.5*  MG  --   --  1.9    Liver Function Tests: Recent Labs  Lab 01/24/19 0912  AST 35  ALT 19  ALKPHOS 63  BILITOT 0.6  PROT 9.8*  ALBUMIN 3.4*   No results for input(s): LIPASE, AMYLASE in the last 168 hours. No results for input(s): AMMONIA in the last 168 hours.  CBC: Recent Labs  Lab 01/24/19 0912 01/25/19 0503 01/26/19 0035  WBC 4.4 2.1* 2.7*  NEUTROABS 3.4  --   --   HGB 12.8 11.9* 11.5*  HCT 39.0 36.7 35.3*  MCV 101.6* 101.7* 102.3*  PLT 108* 86* 90*    Cardiac Enzymes: No results for input(s): CKTOTAL, CKMB, CKMBINDEX, TROPONINI in the last 168  hours.  BNP: Invalid input(s): POCBNP  CBG: No results for input(s): GLUCAP in the last 168 hours.  Microbiology: Results for orders placed or performed during the hospital encounter of 01/24/19  SARS CORONAVIRUS 2 Nasal Swab Aptima Multi Swab     Status: None   Collection Time: 01/24/19 11:23 AM   Specimen: Aptima Multi Swab; Nasal Swab  Result Value Ref Range Status   SARS Coronavirus 2 NEGATIVE NEGATIVE Final    Comment: (NOTE) SARS-CoV-2 target nucleic acids are NOT DETECTED. The SARS-CoV-2 RNA is generally detectable in upper and lower respiratory specimens during the acute phase of infection. Negative results do not preclude SARS-CoV-2 infection, do not rule out co-infections with other pathogens, and should not be used as the sole basis for treatment or other patient management decisions. Negative results must be combined with clinical observations, patient history, and epidemiological information. The expected result is Negative. Fact Sheet for Patients: SugarRoll.be Fact Sheet for Healthcare Providers: https://www.woods-mathews.com/ This test is not yet approved or cleared by the [place] FDA and  has been authorized for detection and/or diagnosis of SARS-CoV-2 by FDA under an Emergency Use Authorization (EUA). This EUA will remain  in effect (meaning this test can be used) for the duration of the COVID-19 declaration under Section 56 4(b)(1) of the Act, 21 U.S.C. section 360bbb-3(b)(1), unless the authorization  is terminated or revoked sooner. Performed at [facility], [place] [address], [place], [place] [address]     Coagulation Studies: Recent Labs    01/24/19 1258  LABPROT 13.9  INR 1.1    Urinalysis: No results for input(s): COLORURINE, LABSPEC, PHURINE, GLUCOSEU, HGBUR, BILIRUBINUR, KETONESUR, PROTEINUR, UROBILINOGEN, NITRITE, LEUKOCYTESUR in the last 72 hours.  Invalid input(s): APPERANCEUR     Imaging: Dg Chest 2 View  Result Date: 01/25/2019 CLINICAL DATA:  CHF EXAM: CHEST - 2 VIEW COMPARISON:  Chest CT from yesterday FINDINGS: Cardiomegaly. There is no edema, consolidation, effusion, or pneumothorax. Cholecystectomy clips. IMPRESSION: Cardiomegaly without failure. Electronically Signed   By: Monte Fantasia M.D.   On: 01/25/2019 07:10   Ct Angio Aortobifemoral W And/or Wo Contrast  Result Date: 01/24/2019 CLINICAL DATA:  Chest pain.  Left foot pain. EXAM: CT ANGIOGRAM CHEST CT ANGIOGRAPHY OF ABDOMINAL AORTA WITH ILIOFEMORAL RUNOFF TECHNIQUE: Multidetector CT imaging of the chest, abdomen, pelvis and lower extremities was performed using the standard protocol during bolus administration of intravenous contrast. Multiplanar CT image reconstructions and MIPs were obtained to evaluate the vascular anatomy. CONTRAST:  124mL OMNIPAQUE IOHEXOL 350 MG/ML SOLN COMPARISON:  08/07/2017 FINDINGS: CTA CHEST FINDINGS Cardiovascular: Mild cardiomegaly with suspected left ventricular enlargement. No pericardial effusion. The RV is nondilated. Satisfactory opacification of pulmonary arteries noted, and there is no evidence of pulmonary emboli. Adequate contrast opacification of the thoracic aorta with no evidence of dissection, aneurysm, or stenosis. There is classic 3-vessel brachiocephalic arch anatomy without proximal stenosis. Mild atheromatous plaque in the descending thoracic aorta. Mediastinum/Nodes: No hilar or mediastinal adenopathy. Lungs/Pleura: No pleural effusion. No pneumothorax. Lungs are clear. Musculoskeletal: No chest wall abnormality. No acute or significant osseous findings. Review of the MIP images confirms the above findings. CTA ABDOMEN AND PELVIS FINDINGS VASCULAR Aorta: Mild calcified atheromatous plaque. No dissection, aneurysm, or stenosis. Celiac: Patent without evidence of aneurysm, dissection, vasculitis or significant stenosis. SMA: Patent without evidence of aneurysm,  dissection, vasculitis or significant stenosis. Renals: Both renal arteries are patent without evidence of aneurysm, dissection, vasculitis, fibromuscular dysplasia or significant stenosis. IMA: Patent without evidence of aneurysm, dissection, vasculitis or significant stenosis. RIGHT Lower Extremity Inflow: Mild nonocclusive plaque in the common and internal iliac arteries. External iliac widely patent. No aneurysm or stenosis. Outflow: Common femoral patent Deep femoral branches patent SFA patent Popliteal patent Runoff: Dominant posterior tibial runoff across the ankle. Peroneal is seen to the ankle. Anterior tibial is diminutive, not seen beyond the lower calf. LEFT Lower Extremity Inflow: Minimal calcified plaque.  No aneurysm or stenosis. Outflow: Common femoral patent. Deep femoral branches patent. SFA patent Popliteal patent Runoff: Anterior tibial origin occlusion over approximately 2 cm, reconstituted distally and patent to the ankle. Occlusion of the tibioperoneal trunk with collateral reconstitution of contiguous peroneal runoff. A diminutive posterior tibial artery is seen to above the ankle. Veins: No obvious venous abnormality within the limitations of this arterial phase study. Review of the MIP images confirms the above findings. NON-VASCULAR Hepatobiliary: No focal liver abnormality is seen. Status post cholecystectomy. No biliary dilatation. Pancreas: Unremarkable. No pancreatic ductal dilatation or surrounding inflammatory changes. Spleen: Normal in size without focal abnormality. Adrenals/Urinary Tract: Adrenals and kidneys unremarkable. Urinary bladder incompletely distended, mildly thick-walled. Stomach/Bowel: Stomach, small bowel unremarkable. Appendix not visualized. No pericecal inflammatory/edematous change. The colon is nondistended, unremarkable. Lymphatic: No abdominal or pelvic adenopathy. A few subcentimeter left para-aortic and pericaval nodes. Reproductive: Uterus and bilateral  adnexa are unremarkable. Other: No  ascites.  No free air. Musculoskeletal: No acute or significant osseous findings. IMPRESSION: VASCULAR 1. 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 2. Mild aortoiliac  atherosclerosis (ICD10-170.0). NON-VASCULAR 1. No acute findings. Electronically Signed   By: Lucrezia Europe M.D.   On: 01/24/2019 16:33   US Renal  Result Date: 01/24/2019 CLINICAL DATA:  Acute on chronic renal failure EXAM: RENAL / URINARY TRACT ULTRASOUND COMPLETE COMPARISON:  August 09, 2017 FINDINGS: Right Kidney: Renal measurements: 9.0 x 3.8 x 5.3 cm = volume: 95 mL. There is increased echogenicity seen throughout the renal parenchyma. No mass or hydronephrosis. Left Kidney: Renal measurements: 9.8 x 4.5 x 4.7 cm = volume: 106 mL. There is increased echogenicity within the renal parenchyma. No mass or hydronephrosis. Bladder: Appears normal for degree of bladder distention. Normal bilateral ureteral jets. IMPRESSION: Findings which could be suggestive of acute kidney injury. No hydronephrosis. Electronically Signed   By: Prudencio Pair M.D.   On: 01/24/2019 17:10   Ct Angio Chest/abd/pel For Dissection W And/or Wo Contrast  Result Date: 01/24/2019 CLINICAL DATA:  Chest pain.  Left foot pain. EXAM: CT ANGIOGRAM CHEST CT ANGIOGRAPHY OF ABDOMINAL AORTA WITH ILIOFEMORAL RUNOFF TECHNIQUE: Multidetector CT imaging of the chest, abdomen, pelvis and lower extremities was performed using the standard protocol during bolus administration of intravenous contrast. Multiplanar CT image reconstructions and MIPs were obtained to evaluate the vascular anatomy. CONTRAST:  168mL OMNIPAQUE IOHEXOL 350 MG/ML SOLN COMPARISON:  08/07/2017 FINDINGS: CTA CHEST FINDINGS Cardiovascular: Mild cardiomegaly with suspected left ventricular enlargement. No pericardial effusion. The RV is nondilated. Satisfactory opacification of pulmonary arteries noted, and there is no evidence of  pulmonary emboli. Adequate contrast opacification of the thoracic aorta with no evidence of dissection, aneurysm, or stenosis. There is classic 3-vessel brachiocephalic arch anatomy without proximal stenosis. Mild atheromatous plaque in the descending thoracic aorta. Mediastinum/Nodes: No hilar or mediastinal adenopathy. Lungs/Pleura: No pleural effusion. No pneumothorax. Lungs are clear. Musculoskeletal: No chest wall abnormality. No acute or significant osseous findings. Review of the MIP images confirms the above findings. CTA ABDOMEN AND PELVIS FINDINGS VASCULAR Aorta: Mild calcified atheromatous plaque. No dissection, aneurysm, or stenosis. Celiac: Patent without evidence of aneurysm, dissection, vasculitis or significant stenosis. SMA: Patent without evidence of aneurysm, dissection, vasculitis or significant stenosis. Renals: Both renal arteries are patent without evidence of aneurysm, dissection, vasculitis, fibromuscular dysplasia or significant stenosis. IMA: Patent without evidence of aneurysm, dissection, vasculitis or significant stenosis. RIGHT Lower Extremity Inflow: Mild nonocclusive plaque in the common and internal iliac arteries. External iliac widely patent. No aneurysm or stenosis. Outflow: Common femoral patent Deep femoral branches patent SFA patent Popliteal patent Runoff: Dominant posterior tibial runoff across the ankle. Peroneal is seen to the ankle. Anterior tibial is diminutive, not seen beyond the lower calf. LEFT Lower Extremity Inflow: Minimal calcified plaque.  No aneurysm or stenosis. Outflow: Common femoral patent. Deep femoral branches patent. SFA patent Popliteal patent Runoff: Anterior tibial origin occlusion over approximately 2 cm, reconstituted distally and patent to the ankle. Occlusion of the tibioperoneal trunk with collateral reconstitution of contiguous peroneal runoff. A diminutive posterior tibial artery is seen to above the ankle. Veins: No obvious venous abnormality  within the limitations of this arterial phase study. Review of the MIP images confirms the above findings. NON-VASCULAR Hepatobiliary: No focal liver abnormality is seen. Status post cholecystectomy. No biliary dilatation. Pancreas: Unremarkable. No pancreatic ductal dilatation or surrounding inflammatory changes. Spleen: Normal in size without focal abnormality.  Adrenals/Urinary Tract: Adrenals and kidneys unremarkable. Urinary bladder incompletely distended, mildly thick-walled. Stomach/Bowel: Stomach, small bowel unremarkable. Appendix not visualized. No pericecal inflammatory/edematous change. The colon is nondistended, unremarkable. Lymphatic: No abdominal or pelvic adenopathy. A few subcentimeter left para-aortic and pericaval nodes. Reproductive: Uterus and bilateral adnexa are unremarkable. Other: No ascites.  No free air. Musculoskeletal: No acute or significant osseous findings. IMPRESSION: VASCULAR 1. 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 2. Mild aortoiliac  atherosclerosis (ICD10-170.0). NON-VASCULAR 1. No acute findings. Electronically Signed   By: Lucrezia Europe M.D.   On: 01/24/2019 16:33     Medications:   . sodium chloride     . amLODipine  10 mg Oral Daily  . apixaban  5 mg Oral BID  . carvedilol  25 mg Oral BID WC  . clopidogrel  75 mg Oral Q breakfast  . nicotine  21 mg Transdermal Daily  . sodium chloride flush  3 mL Intravenous Q12H   sodium chloride, acetaminophen, docusate sodium, HYDROmorphone (DILAUDID) injection, morphine injection, morphine injection, ondansetron (ZOFRAN) IV, oxyCODONE, oxyCODONE-acetaminophen, sodium chloride flush  Assessment/ Plan:  43 y.o. female with hypertension, congestive heart failure, HIV, hepatitis C admitted with LLE embolic disease.    1.  Acute renal failure/chronic kidney disease stage III baseline creatinine 1.7.renal parameters have improved over the course of the hospitalization.   Creatinine down to 1.83.  She is now off of IV fluid hydration.  Avoid nephrotoxins during renal recovery.  2.  Hypertension.blood pressure still high at 131/100.  We will add hydralazine 25 mg p.o. 3 times daily to her medication regimen.      LOS: 2 Rebecca Bridges 8/13/202011:45 AM

## 2019-01-26 NOTE — Progress Notes (Signed)
ANTICOAGULATION CONSULT NOTE  Pharmacy Consult for heparin Indication: limb ischemia  Patient Measurements: Height: 5\' 3"  (160 cm) Weight: 115 lb (52.2 kg) IBW/kg (Calculated) : 52.4 Heparin Dosing Weight: 52.2 kg  Vital Signs: Temp: 98.6 F (37 C) (08/12 2016) Temp Source: Oral (08/12 2016) BP: 103/79 (08/12 2016) Pulse Rate: 75 (08/12 2016)  Labs: Recent Labs    01/24/19 0912 01/24/19 1258  01/25/19 0503 01/25/19 1101 01/26/19 0035  HGB 12.8  --   --  11.9*  --  11.5*  HCT 39.0  --   --  36.7  --  35.3*  PLT 108*  --   --  86*  --  90*  APTT  --  36  --   --   --   --   LABPROT  --  13.9  --   --   --   --   INR  --  1.1  --   --   --   --   HEPARINUNFRC  --   --    < > 0.32 0.13* 0.51  CREATININE 2.22*  --   --  1.90*  --  1.83*   < > = values in this interval not displayed.    Estimated Creatinine Clearance: 33 mL/min (A) (by C-G formula based on SCr of 1.83 mg/dL (H)).   Medical History: Past Medical History:  Diagnosis Date  . CHF (congestive heart failure) ([place])   . Chronic kidney disease   . Hypertension     Assessment: 43 year old female presented with leg numbness, feet cold to touch. No anticoagulation listed on home meds. Pharmacy consulted to dose heparin for limb ischemia.  Goal of Therapy:  Heparin level 0.3-0.7 units/ml Monitor platelets by anticoagulation protocol: Yes   Plan:  08/13 @ 0000 HL 0.51 therapeutic. Will continue current rate and will recheck HL @ 0600, CBC stable, plts continue to be on the low end, will continue to monitor.  Pharmacy to follow and adjust as indicated.  Tobie Lords, PharmD Clinical Pharmacist 01/26/2019,2:40 AM

## 2019-01-26 NOTE — Progress Notes (Signed)
[facility] Daily Progress Note   Subjective: 1 Day Post-Op: 1. Introduction catheter into left lower extremity 3rd order catheter placement  2. Contrast injection left lower extremity for distal runoff with additional 3rd order  3. Thromboembolectomy using the penumbra Cat Rx              4. Percutaneous transluminal angioplasty left anterior tibial to 3 mm  5. Star close closure right common femoral arteriotomy  Patient without complaint this a.m.  No issues overnight.  Objective: Vitals:   01/25/19 1813 01/25/19 2016 01/26/19 0503 01/26/19 0800  BP: 115/88 103/79 (!) 120/94 (!) 131/100  Pulse: 70 75 75   Resp: 20 18 18    Temp: 98 F (36.7 C) 98.6 F (37 C) 98.1 F (36.7 C)   TempSrc: Oral Oral Oral   SpO2: 100% 100% 100%   Weight:      Height:        Intake/Output Summary (Last 24 hours) at 01/26/2019 1104 Last data filed at 01/26/2019 0300 Gross per 24 hour  Intake 2844.27 ml  Output -  Net 2844.27 ml   Physical Exam: A&Ox3, NAD CV: RRR Pulmonary: CTA Bilaterally Abdomen: Soft, Nontender, Nondistended Right Groin: Access site clean dry and intact.  No swelling or drainage noted. Vascular:  Left lower extremity: Thigh soft.  Calf soft.  Extremities warm distally to the toes.  Good capillary refill.   Laboratory: CBC    Component Value Date/Time   WBC 2.7 (L) 01/26/2019 0035   HGB 11.5 (L) 01/26/2019 0035   HGB 13.0 08/07/2017 1530   HCT 35.3 (L) 01/26/2019 0035   HCT 39.2 08/07/2017 1530   PLT 90 (L) 01/26/2019 0035   PLT 145 (L) 08/07/2017 1530   BMET    Component Value Date/Time   NA 130 (L) 01/26/2019 0035   K 4.9 01/26/2019 0035   CL 102 01/26/2019 0035   CO2 24 01/26/2019 0035   GLUCOSE 92 01/26/2019 0035   BUN 27 (H) 01/26/2019 0035   CREATININE 1.83 (H) 01/26/2019 0035   CALCIUM 7.5 (L) 01/26/2019 0035   GFRNONAA 33 (L) 01/26/2019 0035   GFRAA 39 (L)  01/26/2019 0035   Assessment/Planning: The patient is a 43 year old female with multiple medical issues including peripheral artery disease status post a left lower extremity endovascular intervention POD#1 1) transition from heparin to Plavix / Eliquis 2) from a vascular standpoint patient can be discharged home 3) patient to follow-up in our office as an outpatient  Discussed with Dr. Eber Hong Stegmayer PA-C 01/26/2019 11:04 AM

## 2019-01-26 NOTE — Progress Notes (Signed)
ANTICOAGULATION CONSULT NOTE  Pharmacy Consult for heparin Indication: limb ischemia  Patient Measurements: Height: 5\' 3"  (160 cm) Weight: 115 lb (52.2 kg) IBW/kg (Calculated) : 52.4 Heparin Dosing Weight: 52.2 kg  Vital Signs: Temp: 98.1 F (36.7 C) (08/13 0503) Temp Source: Oral (08/13 0503) BP: 120/94 (08/13 0503) Pulse Rate: 75 (08/13 0503)  Labs: Recent Labs    01/24/19 0912 01/24/19 1258  01/25/19 0503 01/25/19 1101 01/26/19 0035 01/26/19 0604  HGB 12.8  --   --  11.9*  --  11.5*  --   HCT 39.0  --   --  36.7  --  35.3*  --   PLT 108*  --   --  86*  --  90*  --   APTT  --  36  --   --   --   --   --   LABPROT  --  13.9  --   --   --   --   --   INR  --  1.1  --   --   --   --   --   HEPARINUNFRC  --   --    < > 0.32 0.13* 0.51 0.52  CREATININE 2.22*  --   --  1.90*  --  1.83*  --    < > = values in this interval not displayed.    Estimated Creatinine Clearance: 33 mL/min (A) (by C-G formula based on SCr of 1.83 mg/dL (H)).   Medical History: Past Medical History:  Diagnosis Date  . CHF (congestive heart failure) ([place])   . Chronic kidney disease   . Hypertension     Assessment: 43 year old female presented with leg numbness, feet cold to touch. No anticoagulation listed on home meds. Pharmacy consulted to dose heparin for limb ischemia.  Goal of Therapy:  Heparin level 0.3-0.7 units/ml Monitor platelets by anticoagulation protocol: Yes   Plan:  08/13 @ 0600 HL 0.52 therapeutic. Will continue current rate and will recheck HL w/ am labs, CBC stable, plts continue to be on the low end, will continue to monitor.  Pharmacy to follow and adjust as indicated.  Tobie Lords, PharmD Clinical Pharmacist 01/26/2019,7:16 AM

## 2019-01-27 ENCOUNTER — Inpatient Hospital Stay (HOSPITAL_COMMUNITY)
Admit: 2019-01-27 | Discharge: 2019-01-27 | Disposition: A | Discharge status: Home / Self Care | Payer: Medicaid Other | Attending: Internal Medicine | Admitting: Internal Medicine

## 2019-01-27 ENCOUNTER — Telehealth: Payer: Self-pay | Admitting: *Deleted

## 2019-01-27 ENCOUNTER — Telehealth: Payer: Self-pay

## 2019-01-27 DIAGNOSIS — I361 Nonrheumatic tricuspid (valve) insufficiency: Secondary | ICD-10-CM

## 2019-01-27 DIAGNOSIS — I340 Nonrheumatic mitral (valve) insufficiency: Secondary | ICD-10-CM

## 2019-01-27 LAB — CBC
HCT: 34.6 % — ABNORMAL LOW (ref 36.0–46.0)
Hemoglobin: 11.5 g/dL — ABNORMAL LOW (ref 12.0–15.0)
MCH: 33.4 pg (ref 26.0–34.0)
MCHC: 33.2 g/dL (ref 30.0–36.0)
MCV: 100.6 fL — ABNORMAL HIGH (ref 80.0–100.0)
Platelets: 82 10*3/uL — ABNORMAL LOW (ref 150–400)
RBC: 3.44 MIL/uL — ABNORMAL LOW (ref 3.87–5.11)
RDW: 13 % (ref 11.5–15.5)
WBC: 2.7 10*3/uL — ABNORMAL LOW (ref 4.0–10.5)
nRBC: 0 % (ref 0.0–0.2)

## 2019-01-27 LAB — FOLATE: Folate: 8.7 ng/mL (ref >5.9)

## 2019-01-27 LAB — BASIC METABOLIC PANEL
Anion gap: 5 (ref 5–15)
BUN: 22 mg/dL — ABNORMAL HIGH (ref 6–20)
CO2: 23 mmol/L (ref 22–32)
Calcium: 8.1 mg/dL — ABNORMAL LOW (ref 8.9–10.3)
Chloride: 105 mmol/L (ref 98–111)
Creatinine, Ser: 1.73 mg/dL — ABNORMAL HIGH (ref 0.44–1.00)
GFR calc Af Amer: 41 mL/min — ABNORMAL LOW (ref >60)
GFR calc non Af Amer: 36 mL/min — ABNORMAL LOW (ref >60)
Glucose, Bld: 95 mg/dL (ref 70–99)
Potassium: 4.8 mmol/L (ref 3.5–5.1)
Sodium: 133 mmol/L — ABNORMAL LOW (ref 135–145)

## 2019-01-27 LAB — ECHOCARDIOGRAM COMPLETE
Height: 63 in
Weight: 1840.01 oz

## 2019-01-27 LAB — HIV 1/2 AB DIFFERENTIATION
HIV 1 Ab: POSITIVE — AB
HIV 2 Ab: NEGATIVE

## 2019-01-27 LAB — HIV ANTIBODY (ROUTINE TESTING W REFLEX): HIV Screen 4th Generation wRfx: REACTIVE — AB

## 2019-01-27 LAB — VITAMIN B12: Vitamin B-12: 196 pg/mL (ref 180–914)

## 2019-01-27 MED ORDER — LOSARTAN POTASSIUM 25 MG PO TABS: 25.0000 mg | ORAL_TABLET | Freq: Every day | ORAL | 0 refills | Status: DC

## 2019-01-27 MED ORDER — SACUBITRIL-VALSARTAN 24-26 MG PO TABS
1.0000 | ORAL_TABLET | Freq: Two times a day (BID) | ORAL | Status: DC
  Filled 2019-01-27 (×2): qty 1

## 2019-01-27 MED ORDER — LOSARTAN POTASSIUM 25 MG PO TABS: 25.0000 mg | ORAL_TABLET | Freq: Every day | ORAL | Status: DC

## 2019-01-27 MED ORDER — SACUBITRIL-VALSARTAN 24-26 MG PO TABS: 1.0000 | ORAL_TABLET | Freq: Two times a day (BID) | ORAL | 1 refills | Status: DC

## 2019-01-27 NOTE — Progress Notes (Signed)
Rebecca Bridges to be D/C'd home per MD order.  Discussed prescriptions and follow up appointments with the patient. Prescriptions given to patient, medication list explained in detail. Pt verbalized understanding.  Allergies as of 01/27/2019      Reactions   Sulfa Antibiotics Rash      Medication List    STOP taking these medications   lisinopril 40 MG tablet Commonly known as: ZESTRIL     TAKE these medications   amLODipine 10 MG tablet Commonly known as: NORVASC Take 1 tablet (10 mg total) by mouth daily.   apixaban 5 MG Tabs tablet Commonly known as: ELIQUIS Take 1 tablet (5 mg total) by mouth 2 (two) times daily.   carvedilol 25 MG tablet Commonly known as: COREG Take 1 tablet (25 mg total) by mouth 2 (two) times daily with a meal.   clopidogrel 75 MG tablet Commonly known as: PLAVIX Take 1 tablet (75 mg total) by mouth daily with breakfast.   hydrALAZINE 25 MG tablet Commonly known as: APRESOLINE Take 1 tablet (25 mg total) by mouth every 8 (eight) hours.   hydrochlorothiazide 12.5 MG tablet Commonly known as: HYDRODIURIL Take 1 tablet (12.5 mg total) by mouth daily.       Vitals:   01/26/19 2106 01/27/19 0551  BP: 112/80 (!) 119/94  Pulse: 76 77  Resp: 18   Temp: 98.1 F (36.7 C) 97.9 F (36.6 C)  SpO2: 100% 100%    Skin clean, dry and intact without evidence of skin break down, no evidence of skin tears noted. IV catheter discontinued intact. Site without signs and symptoms of complications. Dressing and pressure applied. Pt denies pain at this time. No complaints noted.  An After Visit Summary was printed and given to the patient. Patient escorted via [place], and D/C home via private auto.  Chuck Hint RN [facility] [employer]

## 2019-01-27 NOTE — Progress Notes (Signed)
*  PRELIMINARY RESULTS* Echocardiogram 2D Echocardiogram has been performed.  Sherrie Sport 01/27/2019, 11:57 AM

## 2019-01-27 NOTE — Progress Notes (Signed)
[facility]  ROUNDING NOTE   Subjective:  Renal function slightly better today. Creatinine 1.73. Denies any left lower extremity pain.  Objective:  Vital signs in last 24 hours:  Temp:  [97.9 F (36.6 C)-98.3 F (36.8 C)] 97.9 F (36.6 C) (08/14 0551) Pulse Rate:  [71-77] 77 (08/14 0551) Resp:  [18-20] 18 (08/13 2106) BP: (108-119)/(80-94) 119/94 (08/14 0551) SpO2:  [100 %] 100 % (08/14 0551)  Weight change:  Filed Weights   01/24/19 0900  Weight: 52.2 kg    Intake/Output: I/O last 3 completed shifts: In: 185.4 [P.O.:120; I.V.:65.4] Out: -    Intake/Output this shift:  Total I/O In: 600 [P.O.:600] Out: -   Physical Exam: General: No acute distress  Head: Normocephalic, atraumatic. Moist oral mucosal membranes  Eyes: Anicteric  Neck: Supple, trachea midline  Lungs:  Clear to auscultation, normal effort  Heart: S1S2 no rubs  Abdomen:  Soft, nontender, bowel sounds present  Extremities: No peripheral edema.  Neurologic: Awake, alert, following commands  Skin: No lesions       Basic Metabolic Panel: Recent Labs  Lab 01/24/19 0912 01/25/19 0503 01/26/19 0035 01/27/19 0739  NA 129* 130* 130* 133*  K 4.4 4.7 4.9 4.8  CL 103 101 102 105  CO2 21* 22 24 23   GLUCOSE 94 99 92 95  BUN 43* 33* 27* 22*  CREATININE 2.22* 1.90* 1.83* 1.73*  CALCIUM 8.2* 7.6* 7.5* 8.1*  MG  --   --  1.9  --     Liver Function Tests: Recent Labs  Lab 01/24/19 0912  AST 35  ALT 19  ALKPHOS 63  BILITOT 0.6  PROT 9.8*  ALBUMIN 3.4*   No results for input(s): LIPASE, AMYLASE in the last 168 hours. No results for input(s): AMMONIA in the last 168 hours.  CBC: Recent Labs  Lab 01/24/19 0912 01/25/19 0503 01/26/19 0035 01/27/19 0739  WBC 4.4 2.1* 2.7* 2.7*  NEUTROABS 3.4  --   --   --   HGB 12.8 11.9* 11.5* 11.5*  HCT 39.0 36.7 35.3* 34.6*  MCV 101.6* 101.7* 102.3* 100.6*  PLT 108* 86* 90* 82*    Cardiac Enzymes: No results for input(s): CKTOTAL,  CKMB, CKMBINDEX, TROPONINI in the last 168 hours.  BNP: Invalid input(s): POCBNP  CBG: No results for input(s): GLUCAP in the last 168 hours.  Microbiology: Results for orders placed or performed during the hospital encounter of 01/24/19  SARS CORONAVIRUS 2 Nasal Swab Aptima Multi Swab     Status: None   Collection Time: 01/24/19 11:23 AM   Specimen: Aptima Multi Swab; Nasal Swab  Result Value Ref Range Status   SARS Coronavirus 2 NEGATIVE NEGATIVE Final    Comment: (NOTE) SARS-CoV-2 target nucleic acids are NOT DETECTED. The SARS-CoV-2 RNA is generally detectable in upper and lower respiratory specimens during the acute phase of infection. Negative results do not preclude SARS-CoV-2 infection, do not rule out co-infections with other pathogens, and should not be used as the sole basis for treatment or other patient management decisions. Negative results must be combined with clinical observations, patient history, and epidemiological information. The expected result is Negative. Fact Sheet for Patients: SugarRoll.be Fact Sheet for Healthcare Providers: https://www.woods-mathews.com/ This test is not yet approved or cleared by the [place] FDA and  has been authorized for detection and/or diagnosis of SARS-CoV-2 by FDA under an Emergency Use Authorization (EUA). This EUA will remain  in effect (meaning this test can be used) for the duration of  the COVID-19 declaration under Section 56 4(b)(1) of the Act, 21 U.S.C. section 360bbb-3(b)(1), unless the authorization is terminated or revoked sooner. Performed at [facility], [place] [address], [place], [place] [address]     Coagulation Studies: Recent Labs    01/24/19 1258  LABPROT 13.9  INR 1.1    Urinalysis: No results for input(s): COLORURINE, LABSPEC, PHURINE, GLUCOSEU, HGBUR, BILIRUBINUR, KETONESUR, PROTEINUR, UROBILINOGEN, NITRITE, LEUKOCYTESUR in the last 72  hours.  Invalid input(s): APPERANCEUR    Imaging: No results found.   Medications:   . sodium chloride     . amLODipine  10 mg Oral Daily  . apixaban  5 mg Oral BID  . carvedilol  25 mg Oral BID WC  . clopidogrel  75 mg Oral Q breakfast  . hydrALAZINE  25 mg Oral Q8H  . nicotine  21 mg Transdermal Daily  . sodium chloride flush  3 mL Intravenous Q12H   sodium chloride, acetaminophen, docusate sodium, HYDROmorphone (DILAUDID) injection, morphine injection, morphine injection, ondansetron (ZOFRAN) IV, oxyCODONE, oxyCODONE-acetaminophen, sodium chloride flush  Assessment/ Plan:  43 y.o. female with hypertension, congestive heart failure, HIV, hepatitis C admitted with LLE embolic disease.    1.  Acute renal failure/chronic kidney disease stage III baseline creatinine 1.7.patient appears to be doing well.  Her creatinine is back at her baseline at 1.7.  I have advised the patient to avoid NSAIDs.  She will need good blood pressure control as an outpatient.  2.  Hypertension.hypertension improved.  Blood pressure currently 119/94.  Continue current doses of amlodipine, carvedilol, hydralazine.  Consider adding an ARB as an outpatient.      LOS: 3 Marua Qin 8/14/202011:56 AM

## 2019-01-27 NOTE — Telephone Encounter (Signed)
-----   Message from Rise Mu, PA-C sent at 01/27/2019  8:12 AM EDT ----- Can you please mail Zio x 2 to this patient's house. Dx: lower extremity arterial embolism. She will need follow up with [place] in 2 weeks.

## 2019-01-27 NOTE — Discharge Summary (Addendum)
[facility] [place] NAME: Celsea Heltzel    MR#:  [id number]  DATE OF BIRTH:  May 26, 1976  DATE OF ADMISSION:  01/24/2019   ADMITTING PHYSICIAN: Vaughan Basta, MD  DATE OF DISCHARGE: 01/27/2019  4:12 PM  PRIMARY CARE PHYSICIAN: Patient, No Pcp Per   ADMISSION DIAGNOSIS:  CHF (congestive heart failure) (HCC) [I50.9] Left leg pain [M79.605] Acute on chronic renal failure ([place]) [N17.9, N18.9] DISCHARGE DIAGNOSIS:  Principal Problem:   Acute on chronic renal failure (HCC) Active Problems:   Left leg pain   Ischemic leg   Embolism of artery of lower extremity ([place])  SECONDARY DIAGNOSIS:   Past Medical History:  Diagnosis Date  . CHF (congestive heart failure) ([place])   . Chronic kidney disease   . Hypertension    HOSPITAL COURSE:  Arterial embolization to the tibial vessels left lower extremity. She is treated with heparin IV drip.  Changed to Eliquis.  PRN pain medication for support. CT angiogram  1. 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 2. Mild aortoiliac atherosclerosis Dr. Clinton Quant a cardiac etiology.    Chronic systolic CHF with ejection fraction 40% to 50%. Stable.  Diuretics is on hold due to acute on chronic renal failure. Echocardiogram report ejection fraction only 20 to 25%. Per Dr. Rockey Situ, hold Norvasc and start Entresto; cannot exclude the possibility of a mural thrombus at this time. She remains on Eliquis which was previously started this admission. Moving forward, after her cath, consider changing to Coumadin. She will follow up with our office this up coming week to get cath scheduled and optimize medications.  Due to insurance issue.  Dr. Rockey Situ decided to hold [facility] and start losartan 25 mg p.o. daily.  *Back pain Resolved.  *Acute on CKD stage III. Baseline creatinine appears to be around 1.5. Improving with IV fluid.  Creatinine is  down to 1.73. Hold hydrochlorothiazide and lisinopril.  Resume HCTZ after discharge. Renal ultrasound did not report hydronephrosis.  Hyponatremia.    Improving with normal saline.  Looks like chronic.   *Hypertension Continue Coreg, hydralazine and HCTZ, added Entresto.  Discontinue Norvasc.  Follow-up Dr.Gollan as outpatient.  *Active smoking Counseled to quit smoking for 4 minutes and offered nicotine patch.  Leukopenia.  Stable.  Unclear etiology.  May follow-up CBC with PCP as outpatient. I discussed with Dr. Rockey Situ and Mr. Idolina Primer. DISCHARGE CONDITIONS:  Stable, discharge home today. CONSULTS OBTAINED:  Treatment Team:  Anthonette Legato, MD Minna Merritts, MD DRUG ALLERGIES:   Allergies  Allergen Reactions  . Sulfa Antibiotics Rash   DISCHARGE MEDICATIONS:   Allergies as of 01/27/2019      Reactions   Sulfa Antibiotics Rash      Medication List    STOP taking these medications   amLODipine 10 MG tablet Commonly known as: NORVASC   lisinopril 40 MG tablet Commonly known as: ZESTRIL     TAKE these medications   apixaban 5 MG Tabs tablet Commonly known as: ELIQUIS Take 1 tablet (5 mg total) by mouth 2 (two) times daily.   carvedilol 25 MG tablet Commonly known as: COREG Take 1 tablet (25 mg total) by mouth 2 (two) times daily with a meal.   clopidogrel 75 MG tablet Commonly known as: PLAVIX Take 1 tablet (75 mg total) by mouth daily with breakfast.   hydrALAZINE 25 MG tablet Commonly known as: APRESOLINE Take 1 tablet (25 mg total) by mouth every  8 (eight) hours.   hydrochlorothiazide 12.5 MG tablet Commonly known as: HYDRODIURIL Take 1 tablet (12.5 mg total) by mouth daily.   losartan 25 MG tablet Commonly known as: COZAAR Take 1 tablet (25 mg total) by mouth daily.        DISCHARGE INSTRUCTIONS:  See AVS.  If you experience worsening of your admission symptoms, develop shortness of breath, life threatening emergency, suicidal or  homicidal thoughts you must seek medical attention immediately by calling 911 or calling your MD immediately  if symptoms less severe.  You Must read complete instructions/literature along with all the possible adverse reactions/side effects for all the Medicines you take and that have been prescribed to you. Take any new Medicines after you have completely understood and accpet all the possible adverse reactions/side effects.   Please note  You were cared for by a hospitalist during your hospital stay. If you have any questions about your discharge medications or the care you received while you were in the hospital after you are discharged, you can call the unit and asked to speak with the hospitalist on call if the hospitalist that took care of you is not available. Once you are discharged, your primary care physician will handle any further medical issues. Please note that NO REFILLS for any discharge medications will be authorized once you are discharged, as it is imperative that you return to your primary care physician (or establish a relationship with a primary care physician if you do not have one) for your aftercare needs so that they can reassess your need for medications and monitor your lab values.    On the day of Discharge:  VITAL SIGNS:  Blood pressure (!) 119/94, pulse 77, temperature 97.9 F (36.6 C), temperature source Oral, resp. rate 18, height 5\' 3"  (1.6 m), weight 52.2 kg, last menstrual period 01/14/2019, SpO2 100 %. PHYSICAL EXAMINATION:  GENERAL:  43 y.o.-year-old patient lying in the bed with no acute distress.  EYES: Pupils equal, round, reactive to light and accommodation. No scleral icterus. Extraocular muscles intact.  HEENT: Head atraumatic, normocephalic. Oropharynx and nasopharynx clear.  NECK:  Supple, no jugular venous distention. No thyroid enlargement, no tenderness.  LUNGS: Normal breath sounds bilaterally, no wheezing, rales,rhonchi or crepitation. No use of  accessory muscles of respiration.  CARDIOVASCULAR: S1, S2 normal. No murmurs, rubs, or gallops.  ABDOMEN: Soft, non-tender, non-distended. Bowel sounds present. No organomegaly or mass.  EXTREMITIES: No pedal edema, cyanosis, or clubbing.  NEUROLOGIC: Cranial nerves II through XII are intact. Muscle strength 5/5 in all extremities. Sensation intact. Gait not checked.  PSYCHIATRIC: The patient is alert and oriented x 3.  SKIN: No obvious rash, lesion, or ulcer.  DATA REVIEW:   CBC Recent Labs  Lab 01/27/19 0739  WBC 2.7*  HGB 11.5*  HCT 34.6*  PLT 82*    Chemistries  Recent Labs  Lab 01/24/19 0912  01/26/19 0035 01/27/19 0739  NA 129*   < > 130* 133*  K 4.4   < > 4.9 4.8  CL 103   < > 102 105  CO2 21*   < > 24 23  GLUCOSE 94   < > 92 95  BUN 43*   < > 27* 22*  CREATININE 2.22*   < > 1.83* 1.73*  CALCIUM 8.2*   < > 7.5* 8.1*  MG  --   --  1.9  --   AST 35  --   --   --   ALT  19  --   --   --   ALKPHOS 63  --   --   --   BILITOT 0.6  --   --   --    < > = values in this interval not displayed.     Microbiology Results  Results for orders placed or performed during the hospital encounter of 01/24/19  SARS CORONAVIRUS 2 Nasal Swab Aptima Multi Swab     Status: None   Collection Time: 01/24/19 11:23 AM   Specimen: Aptima Multi Swab; Nasal Swab  Result Value Ref Range Status   SARS Coronavirus 2 NEGATIVE NEGATIVE Final    Comment: (NOTE) SARS-CoV-2 target nucleic acids are NOT DETECTED. The SARS-CoV-2 RNA is generally detectable in upper and lower respiratory specimens during the acute phase of infection. Negative results do not preclude SARS-CoV-2 infection, do not rule out co-infections with other pathogens, and should not be used as the sole basis for treatment or other patient management decisions. Negative results must be combined with clinical observations, patient history, and epidemiological information. The expected result is Negative. Fact Sheet for  Patients: SugarRoll.be Fact Sheet for Healthcare Providers: https://www.woods-mathews.com/ This test is not yet approved or cleared by the [place] FDA and  has been authorized for detection and/or diagnosis of SARS-CoV-2 by FDA under an Emergency Use Authorization (EUA). This EUA will remain  in effect (meaning this test can be used) for the duration of the COVID-19 declaration under Section 56 4(b)(1) of the Act, 21 U.S.C. section 360bbb-3(b)(1), unless the authorization is terminated or revoked sooner. Performed at [facility], [place] [address], [place], [place] [address]     RADIOLOGY:  No results found.   Management plans discussed with the patient, family and they are in agreement.  CODE STATUS: Full Code   TOTAL TIME TAKING CARE OF THIS PATIENT: 36 minutes.    Demetrios Loll M.D on 01/27/2019 at 12:44 PM  Between 7am to 6pm - Pager - [phone]  After 6pm go to [email or web address] - Proofreader  [facility]  Office  [phone]  CC: Primary care physician; Patient, No Pcp Per   Note: This dictation was prepared with Dragon dictation along with smaller phrase technology. Any transcriptional errors that result from this process are unintentional.

## 2019-01-27 NOTE — Progress Notes (Addendum)
Echo reviewed which showed an EF of 20-25%. Dr. Rockey Situ has reviewed images without significant coronary artery calcium noted. She is without symptoms concerning for angina. I discussed these findings with her and explained the typical work up including R/LHC. Given today is a Friday, she does not want to wait all weekend while admitted for a cath on Monday and would like to go home. She has been changed from amlodipine to [facility] and remains on Coreg. We cannot exclude the possibility of a mural thrombus at this time. She remains on Eliquis which was previously started this admission. Moving forward, after her cath, consider changing to Coumadin. She will follow up with our office this up coming week to get cath scheduled and optimize medications. Case was discussed among Drs. Juliann Pulse, and Chen.

## 2019-01-27 NOTE — TOC Transition Note (Signed)
Transition of Care [facility]) - CM/SW Discharge Note   Patient Details  Name: Rebecca Bridges MRN: [id number] Date of Birth: 05-02-1976  Transition of Care [facility]) CM/SW Contact:  Beverly Sessions, RN Phone Number: 01/27/2019, 5:02 PM   Clinical Narrative:     Medications losartan, hydralazine, eliquis, plavix obtained from medication management and delivered to patient prior to discharge  Unit secretary attempted to make new patient appointment at [facility]. They will not have an appointment till September and that schedule will not be available until the last week of August.  Patient is aware that she MUST call to obtain an appointment    Barriers to Discharge: Continued Medical Work up   Patient Goals and CMS Choice        Discharge Placement                       Discharge Plan and Services   Discharge Planning Services: CM Consult                                 Social Determinants of Health (SDOH) Interventions     Readmission Risk Interventions No flowsheet data found.

## 2019-01-27 NOTE — Discharge Instructions (Signed)
You may remove your groin dressing and shower.   Smoking cessation

## 2019-01-27 NOTE — Telephone Encounter (Signed)
-----   Message from Santa Claus Callas, NP sent at 01/27/2019  3:57 PM EDT ----- Rebecca Bridges. This lady had a positive HIV test at [facility] recently - looks like she has been known positive but never in care Dx 2015 or so.  Can you send referral to DIS please? Thank you.

## 2019-01-27 NOTE — Telephone Encounter (Signed)
Order placed for zio x2 to be mailed to the patient's home. Message fwd to scheduling to contact the patient to schedule the 2 week f/u.

## 2019-01-27 NOTE — Progress Notes (Signed)
Progress Note  Patient Name: Rebecca Bridges Date of Encounter: 01/27/2019  Primary Cardiologist:gollan, new to [facility]  Subjective   Denies any significant shortness of breath, PND orthopnea No significant arrhythmia, no atrial fibrillation on telemetry Nights her leg symptoms have resolved  Echocardiogram completed sewing dilated cardiomyopathy ejection fraction 20 to 25%  Inpatient Medications    Scheduled Meds: . apixaban  5 mg Oral BID  . carvedilol  25 mg Oral BID WC  . clopidogrel  75 mg Oral Q breakfast  . hydrALAZINE  25 mg Oral Q8H  . losartan  25 mg Oral Daily  . nicotine  21 mg Transdermal Daily  . sodium chloride flush  3 mL Intravenous Q12H   Continuous Infusions: . sodium chloride     PRN Meds: sodium chloride, acetaminophen, docusate sodium, HYDROmorphone (DILAUDID) injection, morphine injection, morphine injection, ondansetron (ZOFRAN) IV, oxyCODONE, oxyCODONE-acetaminophen, sodium chloride flush   Vital Signs    Vitals:   01/26/19 0800 01/26/19 1231 01/26/19 2106 01/27/19 0551  BP: (!) 131/100 108/83 112/80 (!) 119/94  Pulse:  71 76 77  Resp:  20 18   Temp:  98.3 F (36.8 C) 98.1 F (36.7 C) 97.9 F (36.6 C)  TempSrc:  Oral Oral Oral  SpO2:  100% 100% 100%  Weight:      Height:        Intake/Output Summary (Last 24 hours) at 01/27/2019 1822 Last data filed at 01/27/2019 1031 Gross per 24 hour  Intake 600 ml  Output -  Net 600 ml   Last 3 Weights 01/24/2019 01/08/2019 11/21/2018  Weight (lbs) 115 lb 115 lb 120 lb  Weight (kg) 52.164 kg 52.164 kg 54.432 kg      Telemetry    Normal sinus rhythm- Personally Reviewed  ECG     - Personally Reviewed  Physical Exam   GEN: No acute distress.   Neck: No JVD Cardiac: RRR, no murmurs, rubs, or gallops.  Respiratory: Clear to auscultation bilaterally. GI: Soft, nontender, non-distended  MS: No edema; No deformity. Neuro:  Nonfocal  Psych: Normal affect   Labs    High Sensitivity Troponin:    Recent Labs  Lab 01/08/19 1431 01/26/19 1513 01/26/19 1649  TROPONINIHS 18* 13 13      Cardiac EnzymesNo results for input(s): TROPONINI in the last 168 hours. No results for input(s): TROPIPOC in the last 168 hours.   Chemistry Recent Labs  Lab 01/24/19 0912 01/25/19 0503 01/26/19 0035 01/27/19 0739  NA 129* 130* 130* 133*  K 4.4 4.7 4.9 4.8  CL 103 101 102 105  CO2 21* 22 24 23   GLUCOSE 94 99 92 95  BUN 43* 33* 27* 22*  CREATININE 2.22* 1.90* 1.83* 1.73*  CALCIUM 8.2* 7.6* 7.5* 8.1*  PROT 9.8*  --   --   --   ALBUMIN 3.4*  --   --   --   AST 35  --   --   --   ALT 19  --   --   --   ALKPHOS 63  --   --   --   BILITOT 0.6  --   --   --   GFRNONAA 26* 32* 33* 36*  GFRAA 31* 37* 39* 41*  ANIONGAP 5 7 4* 5     Hematology Recent Labs  Lab 01/25/19 0503 01/26/19 0035 01/27/19 0739  WBC 2.1* 2.7* 2.7*  RBC 3.61* 3.45* 3.44*  HGB 11.9* 11.5* 11.5*  HCT 36.7 35.3* 34.6*  MCV  101.7* 102.3* 100.6*  MCH 33.0 33.3 33.4  MCHC 32.4 32.6 33.2  RDW 13.3 13.3 13.0  PLT 86* 90* 82*    BNP Recent Labs  Lab 01/24/19 1123  BNP 854.0*     DDimer No results for input(s): DDIMER in the last 168 hours.   Radiology    No results found.  Cardiac Studies   Echocardiogram   1. The left ventricle has severely reduced systolic function, with an ejection fraction of 20-25%. The cavity size was moderately dilated. There is mildly increased left ventricular wall thickness. Left ventricular diastolic Doppler parameters are  consistent with impaired relaxation. Left ventricular diffuse hypokinesis.  2. The right ventricle has normal systolic function. The cavity was normal. There is no increase in right ventricular wall thickness. Right ventricular systolic pressure is normal with an estimated pressure of 22.8 mmHg.  3. Mitral valve regurgitation is mild to moderate by color flow Doppler.  4. There is dilatation of the aortic root. 3.5 cm  Patient Profile     43 year old  woman with history of mildly reduced ejection fraction estimated 40%, smoker, renal dysfunction, presenting to the hospital with cold left foot  Assessment & Plan     1) dilated cardiomyopathy Etiology unclear, She does not want to stay this weekend for further work-up such as right and left heart catheterization on Monday She prefers to have all other work-up done as an outpatient -She does not have insurance, case manager reports that they are unable to provide Entresto through case management at low doses -For now we will continue carvedilol, add losartan 25 mg daily with titration upwards as blood pressure tolerates --Low suspicion of underlying coronary ischemia as a cause of her cardiomyopathy though she does have risk factors including smoking  Ischemic leg, embolic occlusion Concern for mural thrombus given severely depressed ejection fraction estimated 2025% On Eliquis, started by vascular team , provided by medical management Zio monitor will be arranged   Total encounter time more than 25 minutes  Greater than 50% was spent in counseling and coordination of care with the patient    For questions or updates, please contact [facility] Please consult [email or web address] for contact info under        Signed, Ida Rogue, MD  01/27/2019, 6:22 PM

## 2019-01-27 NOTE — Progress Notes (Signed)
    Telemetry reviewed with no evidence of arrhythmia or high-grade AV block. Labs are pending this morning. Message sent to our office to have Zio x 2 sent to the patient's house for monitoring. She will follow up in our office.

## 2019-01-27 NOTE — Telephone Encounter (Signed)
Information sent to DIS for follow up and connection to care. 

## 2019-01-30 NOTE — Telephone Encounter (Signed)
Appt scheduled for 8/26 with Ignacia Bayley, NP.

## 2019-01-31 ENCOUNTER — Ambulatory Visit (INDEPENDENT_AMBULATORY_CARE_PROVIDER_SITE_OTHER): Payer: Self-pay

## 2019-01-31 DIAGNOSIS — I749 Embolism and thrombosis of unspecified artery: Secondary | ICD-10-CM

## 2019-02-08 ENCOUNTER — Encounter: Payer: Self-pay | Admitting: Nurse Practitioner

## 2019-02-08 ENCOUNTER — Other Ambulatory Visit
Admission: RE | Admit: 2019-02-08 | Discharge: 2019-02-08 | Disposition: A | Discharge status: Home / Self Care | Payer: Medicaid Other | Source: Ambulatory Visit | Attending: Nurse Practitioner | Admitting: Nurse Practitioner

## 2019-02-08 ENCOUNTER — Ambulatory Visit (INDEPENDENT_AMBULATORY_CARE_PROVIDER_SITE_OTHER): Payer: Self-pay | Admitting: Nurse Practitioner

## 2019-02-08 ENCOUNTER — Other Ambulatory Visit: Payer: Self-pay

## 2019-02-08 VITALS — BP 116/78 | HR 78 | Ht 63.0 in | Wt 114.0 lb

## 2019-02-08 DIAGNOSIS — N183 Chronic kidney disease, stage 3 unspecified: Secondary | ICD-10-CM

## 2019-02-08 DIAGNOSIS — I429 Cardiomyopathy, unspecified: Secondary | ICD-10-CM | POA: Insufficient documentation

## 2019-02-08 DIAGNOSIS — I10 Essential (primary) hypertension: Secondary | ICD-10-CM | POA: Insufficient documentation

## 2019-02-08 DIAGNOSIS — I749 Embolism and thrombosis of unspecified artery: Secondary | ICD-10-CM | POA: Insufficient documentation

## 2019-02-08 DIAGNOSIS — Z720 Tobacco use: Secondary | ICD-10-CM

## 2019-02-08 DIAGNOSIS — I5022 Chronic systolic (congestive) heart failure: Secondary | ICD-10-CM

## 2019-02-08 DIAGNOSIS — I447 Left bundle-branch block, unspecified: Secondary | ICD-10-CM | POA: Insufficient documentation

## 2019-02-08 LAB — CBC
HCT: 34.7 % — ABNORMAL LOW (ref 36.0–46.0)
Hemoglobin: 11.1 g/dL — ABNORMAL LOW (ref 12.0–15.0)
MCH: 33 pg (ref 26.0–34.0)
MCHC: 32 g/dL (ref 30.0–36.0)
MCV: 103.3 fL — ABNORMAL HIGH (ref 80.0–100.0)
Platelets: 134 10*3/uL — ABNORMAL LOW (ref 150–400)
RBC: 3.36 MIL/uL — ABNORMAL LOW (ref 3.87–5.11)
RDW: 13.4 % (ref 11.5–15.5)
WBC: 3.3 10*3/uL — ABNORMAL LOW (ref 4.0–10.5)
nRBC: 0 % (ref 0.0–0.2)

## 2019-02-08 LAB — BASIC METABOLIC PANEL
Anion gap: 3 — ABNORMAL LOW (ref 5–15)
BUN: 32 mg/dL — ABNORMAL HIGH (ref 6–20)
CO2: 18 mmol/L — ABNORMAL LOW (ref 22–32)
Calcium: 8.2 mg/dL — ABNORMAL LOW (ref 8.9–10.3)
Chloride: 111 mmol/L (ref 98–111)
Creatinine, Ser: 1.85 mg/dL — ABNORMAL HIGH (ref 0.44–1.00)
GFR calc Af Amer: 38 mL/min — ABNORMAL LOW (ref >60)
GFR calc non Af Amer: 33 mL/min — ABNORMAL LOW (ref >60)
Glucose, Bld: 103 mg/dL — ABNORMAL HIGH (ref 70–99)
Potassium: 5.6 mmol/L — ABNORMAL HIGH (ref 3.5–5.1)
Sodium: 132 mmol/L — ABNORMAL LOW (ref 135–145)

## 2019-02-08 MED ORDER — HYDROCHLOROTHIAZIDE 12.5 MG PO TABS: 12.5000 mg | ORAL_TABLET | Freq: Every day | ORAL | 3 refills | Status: DC

## 2019-02-08 MED ORDER — ENOXAPARIN SODIUM 60 MG/0.6ML ~~LOC~~ SOLN: 60.0000 mg | Freq: Two times a day (BID) | SUBCUTANEOUS | 1 refills | Status: DC

## 2019-02-08 NOTE — Progress Notes (Addendum)
Cardiology Clinic Note   Patient Name: Rebecca Bridges Date of Encounter: 02/08/2019  Primary Care Provider:  Patient, No Pcp Per Primary Cardiologist:  Ida Rogue, MD  Patient Profile    43 year old female with a history of cardiomyopathy, hypertension, left bundle branch block, pancytopenia and tobacco abuse, who presents for post hospital follow-up after recent admission for acute left lower extremity arterial occlusion and finding of worsening LV dysfunction.  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 L AT thromboembolecomty and PTA.   Cardiomyopathy ([place])    a. a. 07/2017 Echo: EF 45-50%, Gr1 DD; b. 01/2019 Echo: EF 20-25%, mod dil LV. Impaired relaxation. Nl RV fxn. Mild to mod MR. Ao root 3.5cm.   CKD (chronic kidney disease), stage III (HCC)    HFrEF (heart failure with reduced ejection fraction) ([place])    a. 07/2017 Echo: EF 45-50%, Gr1 DD; b. 01/2019 Echo: EF 20-25%.   Hypertension    LBBB (left bundle branch block)    Pancytopenia (HCC)    Tobacco abuse    Past Surgical History:  Procedure Laterality Date   CHOLECYSTECTOMY     LOWER EXTREMITY ANGIOGRAPHY Left 01/25/2019   Procedure: Lower Extremity Angiography;  Surgeon: Katha Cabal, MD;  Location: [facility];  Service: Cardiovascular;  Laterality: Left;    Allergies  Allergies  Allergen Reactions   Sulfa Antibiotics Rash    History of Present Illness    43 year old female with the above past medical history including cardiomyopathy, stage III chronic kidney disease, hypertension, left bundle branch block, pancytopenia, and tobacco abuse.  She also has a history of atypical chest discomfort and was previously evaluated by another cardiology practice in February in the setting of left chest pressure.  Echocardiogram showed an EF of 45 to 50% without regional wall motion abnormalities.  Grade 1 diastolic dysfunction  was noted.  She was advised to quit smoking and consider outpatient stress testing.  It does not appear that she followed up.  She was admitted to [facility] regional on August 11 with acute onset of left foot pain and coolness.  Duplex ultrasound of the lower extremities were negative for DVT.  CTA of the chest, abdomen, and pelvis was without acute findings.  CTA with runoff of the lower extremity showed occlusion of the posterior tibial artery as well as the tibial peroneal trunk.  She was seen by vascular surgery and on August 13 underwent thromboembolectomy and percutaneous transluminal angioplasty of the left anterior tibial artery.  She had been heparinized and subsequently transitioned to Eliquis 5 mg twice daily.  Cardiology was consulted due to concern for a cardioembolic source as CT angiography did not show any other sources of atherosclerosis.  2D echocardiogram showed an EF of 20 to 25% and it was recommended that she undergo diagnostic catheterization.  She would have had to stay in the hospital over the weekend and she preferred to be discharged with plan for outpatient follow-up and subsequent outpatient cath.  Since her hospitalization, she has felt well.  She has noted a heavier menses than usual on both Eliquis and Plavix.  She has some degree of chronic dyspnea which has been stable.  She has not had chest pain, palpitations, PND, orthopnea, dizziness, syncope, edema, or early satiety.  She is eager to have catheterization performed and is willing to schedule this for next week.  She is still smoking  about 5 cigarettes a day.  Home Medications    Prior to Admission medications   Medication Sig Start Date End Date Taking? Authorizing Provider  apixaban (ELIQUIS) 5 MG TABS tablet Take 1 tablet (5 mg total) by mouth 2 (two) times daily. 01/26/19  Yes Demetrios Loll, MD  carvedilol (COREG) 25 MG tablet Take 1 tablet (25 mg total) by mouth 2 (two) times daily with a meal. 05/05/18  Yes Lisa Roca, MD  clopidogrel (PLAVIX) 75 MG tablet Take 1 tablet (75 mg total) by mouth daily with breakfast. 01/27/19  Yes Demetrios Loll, MD  hydrALAZINE (APRESOLINE) 25 MG tablet Take 1 tablet (25 mg total) by mouth every 8 (eight) hours. 01/26/19  Yes Demetrios Loll, MD  hydrochlorothiazide (HYDRODIURIL) 12.5 MG tablet Take 1 tablet (12.5 mg total) by mouth daily. 01/08/19  Yes Triplett, Cari B, FNP  losartan (COZAAR) 25 MG tablet Take 1 tablet (25 mg total) by mouth daily. 01/27/19  Yes Demetrios Loll, MD    Family History    Family History  Problem Relation Age of Onset   Hypertension Mother    Cerebral aneurysm Mother    She indicated that her mother is deceased.  Social History    Social History   Socioeconomic History   Marital status: Single    Spouse name: Not on file   Number of children: Not on file   Years of education: Not on file   Highest education level: Not on file  Occupational History   Not on file  Social Needs   Financial resource strain: Not on file   Food insecurity    Worry: Not on file    Inability: Not on file   Transportation needs    Medical: Not on file    Non-medical: Not on file  Tobacco Use   Smoking status: Current Every Day Smoker   Smokeless tobacco: Never Used  Substance and Sexual Activity   Alcohol use: No    Frequency: Never   Drug use: No   Sexual activity: Not on file  Lifestyle   Physical activity    Days per week: Not on file    Minutes per session: Not on file   Stress: Not on file  Relationships   Social connections    Talks on phone: Not on file    Gets together: Not on file    Attends religious service: Not on file    Active member of club or organization: Not on file    Attends meetings of clubs or organizations: Not on file    Relationship status: Not on file   Intimate partner violence    Fear of current or ex partner: Not on file    Emotionally abused: Not on file    Physically abused: Not on file     Forced sexual activity: Not on file  Other Topics Concern   Not on file  Social History Narrative   Not on file     Review of Systems    General:  No chills, fever, night sweats or weight changes.  Cardiovascular:  No chest pain, +++ dyspnea on exertion, no edema, orthopnea, palpitations, paroxysmal nocturnal dyspnea. Dermatological: No rash, lesions/masses Respiratory: No cough, dyspnea Urologic: No hematuria, dysuria Abdominal:   No nausea, vomiting, diarrhea, bright red blood per rectum, melena, or hematemesis Neurologic:  No visual changes, wkns, changes in mental status. All other systems reviewed and are otherwise negative except as noted above.  Physical Exam  VS:  BP 116/78 (BP Location: Left Arm, Patient Position: Sitting, Cuff Size: Normal)    Pulse 78    Ht 5\' 3"  (1.6 m)    Wt 114 lb (51.7 kg)    LMP 01/14/2019 (Approximate)    SpO2 98%    BMI 20.19 kg/m  , BMI Body mass index is 20.19 kg/m. GEN: Well nourished, well developed, in no acute distress. HEENT: normal. Neck: Supple, no JVD, carotid bruits, or masses. Cardiac: RRR, no murmurs, rubs, or gallops. No clubbing, cyanosis, edema.  Radials/DP/PT 2+ and equal bilaterally.  Respiratory:  Respirations regular and unlabored, clear to auscultation bilaterally. GI: Soft, nontender, nondistended, BS + x 4. MS: no deformity or atrophy. Skin: warm and dry, no rash. Neuro:  Strength and sensation are intact. Psych: Normal affect.  Accessory Clinical Findings    ECG personally reviewed by me today-regular sinus rhythm, left axis deviation, left bundle branch block - No acute changes  Lab Results  Component Value Date   WBC 3.3 (L) 02/08/2019   HGB 11.1 (L) 02/08/2019   HCT 34.7 (L) 02/08/2019   MCV 103.3 (H) 02/08/2019   PLT 134 (L) 02/08/2019   Lab Results  Component Value Date   CREATININE 1.85 (H) 02/08/2019   BUN 32 (H) 02/08/2019   NA 132 (L) 02/08/2019   K 5.6 (H) 02/08/2019   CL 111 02/08/2019   CO2  18 (L) 02/08/2019   Lab Results  Component Value Date   ALT 19 01/24/2019   AST 35 01/24/2019   ALKPHOS 63 01/24/2019   BILITOT 0.6 01/24/2019   Lab Results  Component Value Date   CHOL 123 01/26/2019   HDL 34 (L) 01/26/2019   LDLCALC 61 01/26/2019   TRIG 138 01/26/2019   CHOLHDL 3.6 01/26/2019     Assessment & Plan   1.  Cardiomyopathy/HFrEF: Patient was recently admitted due to left foot pain and was found to have thromboembolic disease involving the left posterior tibial artery and tibial peroneal trunk.  She subsequently underwent thrombectomy and PTA of the left anterior tibial.  In evaluation for cardioembolic source, echo showed an EF of 20 to 25% which was down from 45 to 50% in February of last year.  Recommendation was made for diagnostic right and left heart cardiac catheterization however patient preferred to have this performed as an outpatient.  She has some degree of chronic dyspnea but is euvolemic on examination today.  She has been maintained on beta-blocker, hydralazine, ARB, and low-dose HCTZ.  She is eager to have diagnostic catheterization performed.  We have been able to schedule this for next Wednesday.  She will undergo COVID testing later this week.  Lab work today revealed stable pancytopenia with relatively stable renal function and mild elevation of potassium.  I will have her follow-up a basic metabolic panel when she comes for COVID testing.  As she is currently anticoagulated with Eliquis in the setting of recent thromboembolism, she will require bridging with Lovenox.  I discussed this with our pharmacist today who has recommended that we continue 5 mg of Eliquis for now (question if 2.5 mg twice daily would be more appropriate given weight and creatinine though no clear data on dosing in the setting of concern for LV thrombus) and we will bridge her with Lovenox 60 mg every 12 hours (creatinine clearance 34.23) for 48 hours prior to cath. Given paucity of data  for DOACs in possible LV thrombus, she would like be best served with bridging  post-cath along with transition to warfarin.  The patient understands that risks include but are not limited to stroke (1 in 1000), death (1 in 13), kidney failure [usually temporary] (1 in 500), bleeding (1 in 200), allergic reaction [possibly serious] (1 in 200), and agrees to proceed.    2.  Left lower extremity thromboembolism: Up to this point, presumption for cardioembolic source, especially in the setting of LV dysfunction with concern for LV thrombus.  See comments above regarding Eliquis therapy, which we will continue at 5 mg twice daily for now.  In the setting of recent event, will plan for bridging prior to catheterization with Lovenox as outlined above.  3.  Essential hypertension: Stable on beta-blocker, ARB, hydralazine, and HCTZ.  4.  Stage III chronic kidney disease: Creatinine relatively stable at 1.85.  Will need to minimize contrast at time of catheterization and will also gently hydrate precath, taking heart failure into account.  She was seen by nephrology during recent hospitalization.  Will ask her to hold HCTZ and losartan on the morning of cath.  5.  Tobacco abuse: Complete cessation advised.  Currently smoking 5 cigarettes a day.  6.  Pancytopenia: Stable.  Will need outpatient follow-up and possibly referral to hematology.  7.  Disposition: Diagnostic catheterization next week.  Follow-up in clinic 2 weeks after that  And consider transition to [facility] @ that time.  Murray Hodgkins, NP 02/08/2019, 5:35 PM

## 2019-02-08 NOTE — Patient Instructions (Addendum)
Medication Instructions:  See cath notes for specific changes.  If you need a refill on your cardiac medications before your next appointment, please call your pharmacy.   Lab work: 1- Your physician recommends that you return for lab work TODAY at [facility]. No appt is needed. Hours are M-F 7AM- 6 PM.  2- Covid 19 Pre Screening- Please report to the medical arts for CV 19 pre test on ________. Drive thru testing, stay in your car. Let staff know you have an upcoming procedure and order has been placed for testing.  After your test, you must wear a mask and stay in isolation until after procedure.    If you have labs (blood work) drawn today and your tests are completely normal, you will receive your results only by: Marland Kitchen MyChart Message (if you have MyChart) OR . A paper copy in the mail If you have any lab test that is abnormal or we need to change your treatment, we will call you to review the results.  Testing/Procedures: 1- L/R Heart cath     [address], [place] [address] Dept: [phone] Loc: [place]  02/08/2019  You are scheduled for a Cardiac Catheterization on Wednesday, September 2 with Dr. Ida Rogue.  1. Please arrive at the [place] [facility] at 6:30 AM (This time is one hour before your procedure to ensure your preparation). Free valet parking service is available.   Special note: Every effort is made to have your procedure done on time. Please understand that emergencies sometimes delay scheduled procedures.  2. Diet: Do not eat solid foods after midnight.  The patient may have clear liquids until 5am upon the day of the procedure.  3. Labs: You will need to have blood drawn  TODAY! 4. Medication instructions in preparation for your procedure:  Take last Eliquis dose on Sunday Aug 30th PM dose.   Start Lovenox on Monday  Aug 31st.    On the morning of your procedure, take your Plavix/Clopidogrel and any morning medicines NOT listed above.  You may use sips of water.  5. Plan for one night stay--bring personal belongings. 6. Bring a current list of your medications and current insurance cards. 7. You MUST have a responsible person to drive you home. 8. Someone MUST be with you the first 24 hours after you arrive home or your discharge will be delayed. 9. Please wear clothes that are easy to get on and off and wear slip-on shoes.  Thank you for allowing [place] to care for you!   -- [facility]   Follow-Up: At [facility], you and your health needs are our priority.  As part of our continuing mission to provide you with exceptional heart care, we have created designated Provider Care Teams.  These Care Teams include your primary Cardiologist (physician) and Advanced Practice Providers (APPs -  Physician Assistants and Nurse Practitioners) who all work together to provide you with the care you need, when you need it. You will need a follow up appointment in 3 weeks.  You may see Ida Rogue, MD or Murray Hodgkins, NP.

## 2019-02-09 ENCOUNTER — Telehealth: Payer: Self-pay

## 2019-02-09 DIAGNOSIS — Z01810 Encounter for preprocedural cardiovascular examination: Secondary | ICD-10-CM

## 2019-02-09 DIAGNOSIS — I429 Cardiomyopathy, unspecified: Secondary | ICD-10-CM

## 2019-02-09 NOTE — Telephone Encounter (Signed)
Call to patient to make her aware of date/time for Covid testing this Friday 8/28 from 12:30-2:30 at the [facility] building.  Encouraged pt to call for any further questions or concerns.

## 2019-02-09 NOTE — Telephone Encounter (Signed)
-----   Message from Theora Gianotti, NP sent at 02/08/2019  4:45 PM EDT ----- Na mildly low but stable.  K is sl elevated @ 5.6. Kidney fxn relatively stable w/ creat of 1.85. Blood counts stable w/ mild pancytopenia (low h/h, wbc, platelets). Please have her repeat bmet when she presents for covid testing later this week to reassess potassium. Please have her hold hctz and losartan on day of cath.

## 2019-02-09 NOTE — Telephone Encounter (Signed)
Attempted to call patient. LMTCB 02/09/2019

## 2019-02-10 ENCOUNTER — Other Ambulatory Visit: Payer: Self-pay

## 2019-02-10 ENCOUNTER — Other Ambulatory Visit
Admission: RE | Admit: 2019-02-10 | Discharge: 2019-02-10 | Disposition: A | Discharge status: Home / Self Care | Payer: Medicaid Other | Source: Ambulatory Visit | Attending: Cardiovascular Disease | Admitting: Cardiovascular Disease

## 2019-02-10 DIAGNOSIS — Z20828 Contact with and (suspected) exposure to other viral communicable diseases: Secondary | ICD-10-CM | POA: Diagnosis not present

## 2019-02-10 DIAGNOSIS — Z01812 Encounter for preprocedural laboratory examination: Secondary | ICD-10-CM | POA: Insufficient documentation

## 2019-02-10 NOTE — Telephone Encounter (Signed)
Attempted to call patient. LMTCB 02/10/2019

## 2019-02-11 LAB — SARS CORONAVIRUS 2 (TAT 6-24 HRS): SARS Coronavirus 2: NEGATIVE

## 2019-02-13 NOTE — Telephone Encounter (Signed)
No answer. Left message to call back.   1.  Patient needs results and to know to hold HCTZ/losartan the day of cath. Patient did not get the message in time to get the repeat BMET. Discussed with Dr Rockey Situ who advised patient could have STAT BMET the morning of cath at the [facility].   2.  Patient needs to be made aware of this and that her procedure was pushed to 0930.   3.  She should still plan to arrive around 0800 in order to have enough time for STAT BMET.

## 2019-02-14 ENCOUNTER — Telehealth: Payer: Self-pay | Admitting: Cardiovascular Disease

## 2019-02-14 NOTE — Telephone Encounter (Signed)
Called patient back she verbalized understanding of the following: 1- hold HCTZ/losartan morning of procedure. 2- arrive at 0830 for 0930 procedure. 3- she is aware she will get lab work when she gets there. 4- she has instructions for cath from [facility] and has stopped her Eliquis on Sunday.  She was very Patent attorney.

## 2019-02-14 NOTE — Addendum Note (Signed)
Addended by: Vanessa Ralphs on: 02/14/2019 09:31 AM   Modules accepted: Orders

## 2019-02-14 NOTE — Telephone Encounter (Signed)
Called Spec[facility] at [facility] to make sure ok to draw STAT BMET when patient comes in tomorrow morning. They said this was fine. Order placed. Patient still is fine to come in 1 hour prior to.  No answer with patient's mobile number. Left message to call back.  Attempted to reach other person listed, Craig Guess, who is not on D[place] but thought may be able to help [place] reach the patient. No answer, left message with no identifying info to call the [place] office back.

## 2019-02-14 NOTE — Telephone Encounter (Signed)
Patient is calling back to discuss procedure tomorrow.

## 2019-02-14 NOTE — Telephone Encounter (Signed)
Patient called back and was addressed in another encounter started today. All has been addressed.

## 2019-02-15 ENCOUNTER — Ambulatory Visit
Admission: RE | Admit: 2019-02-15 | Discharge: 2019-02-15 | Disposition: A | Discharge status: Home / Self Care | Payer: Medicaid Other | Attending: Cardiovascular Disease | Admitting: Cardiovascular Disease

## 2019-02-15 ENCOUNTER — Encounter: Payer: Self-pay | Admitting: *Deleted

## 2019-02-15 ENCOUNTER — Encounter
Admission: RE | Disposition: A | Discharge status: Home / Self Care | Payer: Self-pay | Source: Home / Self Care | Attending: Cardiovascular Disease

## 2019-02-15 ENCOUNTER — Other Ambulatory Visit: Payer: Self-pay

## 2019-02-15 DIAGNOSIS — I509 Heart failure, unspecified: Secondary | ICD-10-CM | POA: Diagnosis present

## 2019-02-15 DIAGNOSIS — N183 Chronic kidney disease, stage 3 (moderate): Secondary | ICD-10-CM | POA: Insufficient documentation

## 2019-02-15 DIAGNOSIS — I447 Left bundle-branch block, unspecified: Secondary | ICD-10-CM | POA: Insufficient documentation

## 2019-02-15 DIAGNOSIS — I429 Cardiomyopathy, unspecified: Secondary | ICD-10-CM | POA: Insufficient documentation

## 2019-02-15 DIAGNOSIS — Z79899 Other long term (current) drug therapy: Secondary | ICD-10-CM | POA: Diagnosis not present

## 2019-02-15 DIAGNOSIS — Z7901 Long term (current) use of anticoagulants: Secondary | ICD-10-CM | POA: Insufficient documentation

## 2019-02-15 DIAGNOSIS — I129 Hypertensive chronic kidney disease with stage 1 through stage 4 chronic kidney disease, or unspecified chronic kidney disease: Secondary | ICD-10-CM | POA: Diagnosis not present

## 2019-02-15 DIAGNOSIS — F17210 Nicotine dependence, cigarettes, uncomplicated: Secondary | ICD-10-CM | POA: Insufficient documentation

## 2019-02-15 DIAGNOSIS — Z7902 Long term (current) use of antithrombotics/antiplatelets: Secondary | ICD-10-CM | POA: Diagnosis not present

## 2019-02-15 DIAGNOSIS — I5022 Chronic systolic (congestive) heart failure: Secondary | ICD-10-CM

## 2019-02-15 DIAGNOSIS — I2510 Atherosclerotic heart disease of native coronary artery without angina pectoris: Secondary | ICD-10-CM | POA: Diagnosis not present

## 2019-02-15 DIAGNOSIS — I420 Dilated cardiomyopathy: Secondary | ICD-10-CM

## 2019-02-15 DIAGNOSIS — I513 Intracardiac thrombosis, not elsewhere classified: Secondary | ICD-10-CM

## 2019-02-15 HISTORY — PX: RIGHT/LEFT HEART CATH AND CORONARY ANGIOGRAPHY: CATH118266

## 2019-02-15 HISTORY — DX: Atherosclerotic heart disease of native coronary artery without angina pectoris: I25.10

## 2019-02-15 LAB — BASIC METABOLIC PANEL
Anion gap: 5 (ref 5–15)
BUN: 40 mg/dL — ABNORMAL HIGH (ref 6–20)
CO2: 16 mmol/L — ABNORMAL LOW (ref 22–32)
Calcium: 7.9 mg/dL — ABNORMAL LOW (ref 8.9–10.3)
Chloride: 114 mmol/L — ABNORMAL HIGH (ref 98–111)
Creatinine, Ser: 2.14 mg/dL — ABNORMAL HIGH (ref 0.44–1.00)
GFR calc Af Amer: 32 mL/min — ABNORMAL LOW (ref >60)
GFR calc non Af Amer: 28 mL/min — ABNORMAL LOW (ref >60)
Glucose, Bld: 88 mg/dL (ref 70–99)
Potassium: 5.7 mmol/L — ABNORMAL HIGH (ref 3.5–5.1)
Sodium: 135 mmol/L (ref 135–145)

## 2019-02-15 SURGERY — RIGHT/LEFT HEART CATH AND CORONARY ANGIOGRAPHY
Anesthesia: Moderate Sedation | Laterality: Bilateral

## 2019-02-15 MED ORDER — FENTANYL CITRATE (PF) 100 MCG/2ML IJ SOLN
INTRAMUSCULAR | Status: AC
  Filled 2019-02-15: qty 2

## 2019-02-15 MED ORDER — SODIUM CHLORIDE 0.9 % IV SOLN
INTRAVENOUS | Status: DC
  Administered 2019-02-15: 09:00:00 via INTRAVENOUS

## 2019-02-15 MED ORDER — HEPARIN (PORCINE) IN NACL 1000-0.9 UT/500ML-% IV SOLN
INTRAVENOUS | Status: DC | PRN
  Administered 2019-02-15: 500 mL

## 2019-02-15 MED ORDER — SODIUM CHLORIDE 0.9 % IV SOLN: 250.0000 mL | INTRAVENOUS | Status: DC | PRN

## 2019-02-15 MED ORDER — SODIUM CHLORIDE 0.9% FLUSH: 3.0000 mL | Freq: Two times a day (BID) | INTRAVENOUS | Status: DC

## 2019-02-15 MED ORDER — SODIUM CHLORIDE 0.9% FLUSH: 3.0000 mL | INTRAVENOUS | Status: DC | PRN

## 2019-02-15 MED ORDER — ASPIRIN 81 MG PO CHEW: 81.0000 mg | CHEWABLE_TABLET | ORAL | Status: DC

## 2019-02-15 MED ORDER — HEPARIN (PORCINE) IN NACL 1000-0.9 UT/500ML-% IV SOLN
INTRAVENOUS | Status: AC
  Filled 2019-02-15: qty 1000

## 2019-02-15 MED ORDER — MIDAZOLAM HCL 2 MG/2ML IJ SOLN
INTRAMUSCULAR | Status: DC | PRN
  Administered 2019-02-15: 1 mg via INTRAVENOUS

## 2019-02-15 MED ORDER — MIDAZOLAM HCL 2 MG/2ML IJ SOLN
INTRAMUSCULAR | Status: AC
  Filled 2019-02-15: qty 2

## 2019-02-15 MED ORDER — IOHEXOL 300 MG/ML  SOLN
INTRAMUSCULAR | Status: DC | PRN
  Administered 2019-02-15: 80 mL via INTRA_ARTERIAL

## 2019-02-15 MED ORDER — FENTANYL CITRATE (PF) 100 MCG/2ML IJ SOLN
INTRAMUSCULAR | Status: DC | PRN
  Administered 2019-02-15 (×2): 25 ug via INTRAVENOUS

## 2019-02-15 SURGICAL SUPPLY — 18 items
CATH INFINITI 5 FR 3DRC (CATHETERS) ×3 IMPLANT
CATH INFINITI 5FR ANG PIGTAIL (CATHETERS) ×3 IMPLANT
CATH INFINITI 5FR JL4 (CATHETERS) ×3 IMPLANT
CATH INFINITI JR4 5F (CATHETERS) ×3 IMPLANT
CATH SWANZ 7F THERMO (CATHETERS) ×3 IMPLANT
DEVICE CLOSURE MYNXGRIP 5F (Vascular Products) ×3 IMPLANT
DEVICE INFLAT 30 PLUS (MISCELLANEOUS) IMPLANT
GLIDESHEATH SLEND SS 6F .021 (SHEATH) IMPLANT
GUIDEWIRE EMER 3M J .025X150CM (WIRE) ×3 IMPLANT
KIT MANI 3VAL PERCEP (MISCELLANEOUS) ×3 IMPLANT
KIT RIGHT HEART (MISCELLANEOUS) ×3 IMPLANT
NEEDLE PERC 18GX7CM (NEEDLE) ×3 IMPLANT
NEEDLE SMART 18G ACCESS (NEEDLE) ×3 IMPLANT
PACK CARDIAC CATH (CUSTOM PROCEDURE TRAY) ×3 IMPLANT
SHEATH AVANTI 5FR X 11CM (SHEATH) ×3 IMPLANT
SHEATH AVANTI 7FRX11 (SHEATH) ×3 IMPLANT
WIRE GUIDERIGHT .035X150 (WIRE) ×3 IMPLANT
WIRE ROSEN-J .035X260CM (WIRE) IMPLANT

## 2019-02-15 NOTE — OR Nursing (Signed)
Pt denied having sex since last pregnancy test last week. Last period last week. Signed waiver.

## 2019-02-15 NOTE — OR Nursing (Signed)
Pt instructed to stop eating banana and potassium elevating medications at least until sees Dr Rockey Situ in office on Sept 15

## 2019-02-17 ENCOUNTER — Ambulatory Visit: Payer: Self-pay | Admitting: Physician Assistant

## 2019-02-23 ENCOUNTER — Telehealth: Payer: Self-pay | Admitting: *Deleted

## 2019-02-23 NOTE — Telephone Encounter (Signed)
Patient information sent to [facility] to follow up. Patient has stated she is in care at [facility] however there is no record or treatment for HIV since 2016.

## 2019-02-24 ENCOUNTER — Other Ambulatory Visit (INDEPENDENT_AMBULATORY_CARE_PROVIDER_SITE_OTHER): Payer: Self-pay | Admitting: Vascular Surgery

## 2019-02-24 DIAGNOSIS — I739 Peripheral vascular disease, unspecified: Secondary | ICD-10-CM

## 2019-02-24 NOTE — Progress Notes (Addendum)
Cardiology Office Note    Date:  02/28/2019   ID:  Rebecca Bridges, DOB [birth year], MRN [id number]  PCP:  Patient, No Pcp Per  Cardiologist:  Ida Rogue, MD  Electrophysiologist:  None   Chief Complaint: Follow-up diagnostic cath  History of Present Illness:   Rebecca Bridges is a 43 y.o. female with history of nonischemic cardiomyopathy, left lower extremity arterial occlusion, CKD stage III, left bundle branch block, pancytopenia, hypertension, and tobacco abuse who presents for follow-up of diagnostic cath.  Patient was previously evaluated at outside cardiology clinic for atypical chest pain in 07/2018 with echo showing an EF of 45 to 50% without regional wall motion abnormalities and grade 1 diastolic dysfunction.  Recommendation was to quit smoking and consider outpatient stress testing.  It does not appear patient followed up at that time.  She was admitted to [facility] in 01/2019 with acute onset of left foot pain and coolness.  Duplex ultrasound of the lower extremities were negative for DVT.  CTA of the chest, abdomen, and pelvis was without acute findings.  CTA with runoff of the lower extremities showed occlusion of the posterior tibial artery as well as the tibial peroneal trunk.  She was seen by vascular surgery and underwent thromboembolectomy and percutaneous transluminal angioplasty of the left anterior tibial artery on 01/26/2019.  She was subsequently transitioned from heparin to Eliquis.  Echo to further evaluate for possible cardioembolic etiology of her thromboembolism showed an EF of 20 to 25%, diffuse hypokinesis, diastolic dysfunction, normal RV systolic function and cavity size, mild to moderate mitral regurgitation, dilated aortic root measuring 3.5 cm.  Given diagnostic cath would have occurred over the weekend she preferred to be discharged and undergo outpatient cardiac work-up.  She subsequently underwent diagnostic right and left cardiac cath on 02/15/2019 which showed no obstructive  disease with normal pulmonary pressures as outlined below.  Given concern for LV thrombus in the setting of her cardiomyopathy it was recommended she be transitioned from Eliquis to warfarin.  She comes in doing very well from a cardiac perspective.  She continues to note improvement in her exertional dyspnea.  She denies any chest pain, palpitations, dizziness, presyncope, or syncope.  No lower extremity swelling, orthopnea, abdominal tension, PND, early satiety.  No falls, BRBPR, or melena.  She is compliant with all medications though did run out of Coreg, Plavix, and losartan this morning and has not yet taken them for today.  She is watching her salt intake.  She continues to smoke approximately 5 cigarettes/day and is not interested in completely quitting.  She rarely drinks alcohol socially with friends.  She denies any illegal drugs.  She has been seen by vascular surgery in follow-up on 02/27/2019 with plans for follow-up noninvasive imaging in several months time.  ZIO monitoring is pending.  She does have concerns with transitioning from Eliquis to warfarin given the needed Lovenox bridge and cost of this medication.  She has been unable to transition from losartan to [facility] given that they do not carry low-dose Entresto at medication management.  She has decreased some of the higher potassium foods that she was eating in light of her recently elevated potassium as outlined below.  She notes improved left foot paresthesias.   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 L AT thromboembolecomty and PTA.   Cardiomyopathy ([place])    a. a. 07/2017 Echo: EF 45-50%, Gr1 DD; b. 01/2019 Echo: EF 20-25%, mod dil LV.  Impaired relaxation. Nl RV fxn. Mild to mod MR. Ao root 3.5cm.   CKD (chronic kidney disease), stage III (HCC)    HFrEF (heart failure with reduced ejection fraction) ([place])    a. 07/2017 Echo: EF 45-50%, Gr1 DD; b. 01/2019 Echo: EF 20-25%.   Hypertension    LBBB (left bundle branch block)    Pancytopenia (HCC)    Tobacco abuse     Past Surgical History:  Procedure Laterality Date   CHOLECYSTECTOMY     LOWER EXTREMITY ANGIOGRAPHY Left 01/25/2019   Procedure: Lower Extremity Angiography;  Surgeon: Katha Cabal, MD;  Location: [place] [facility];  Service: Cardiovascular;  Laterality: Left;   RIGHT/LEFT HEART CATH AND CORONARY ANGIOGRAPHY Bilateral 02/15/2019   Procedure: RIGHT/LEFT HEART CATH AND CORONARY ANGIOGRAPHY;  Surgeon: Minna Merritts, MD;  Location: [place] [facility];  Service: Cardiovascular;  Laterality: Bilateral;    Current Medications: Current Meds  Medication Sig   apixaban (ELIQUIS) 5 MG TABS tablet Take 1 tablet (5 mg total) by mouth 2 (two) times daily.   carvedilol (COREG) 25 MG tablet Take 1 tablet (25 mg total) by mouth 2 (two) times daily with a meal.   clopidogrel (PLAVIX) 75 MG tablet Take 1 tablet (75 mg total) by mouth daily with breakfast.   enoxaparin (LOVENOX) 60 MG/0.6ML injection Inject 0.6 mLs (60 mg total) into the skin every 12 (twelve) hours.   hydrALAZINE (APRESOLINE) 25 MG tablet Take 1 tablet (25 mg total) by mouth every 8 (eight) hours.   hydrochlorothiazide (HYDRODIURIL) 12.5 MG tablet Take 1 tablet (12.5 mg total) by mouth daily.   losartan (COZAAR) 25 MG tablet Take 1 tablet (25 mg total) by mouth daily.   [DISCONTINUED] carvedilol (COREG) 25 MG tablet Take 1 tablet (25 mg total) by mouth 2 (two) times daily with a meal.   [DISCONTINUED] clopidogrel (PLAVIX) 75 MG tablet Take 1 tablet (75 mg total) by mouth daily with breakfast.   [DISCONTINUED] losartan (COZAAR) 25 MG tablet Take 1 tablet (25 mg total) by mouth daily.     Allergies:   Sulfa antibiotics   Social History   Socioeconomic History   Marital status: Single    Spouse name: Not on file   Number of children: Not on file   Years of education: Not on file   Highest education level: Not on file  Occupational History   Not on file  Social Needs   Financial resource strain: Not on file   Food insecurity    Worry: Not on file    Inability: Not on file   Transportation needs    Medical: Not on file    Non-medical: Not on file  Tobacco Use   Smoking status: Current Every Day Smoker    Types: Cigarettes   Smokeless tobacco: Never Used   Tobacco comment: 5 cigarettes a day  Substance and Sexual Activity   Alcohol use: No    Frequency: Never   Drug use: No   Sexual activity: Not on file  Lifestyle   Physical activity    Days per week: Not on file    Minutes per session: Not on file   Stress: Not on file  Relationships   Social connections  Talks on phone: Not on file    Gets together: Not on file    Attends religious service: Not on file    Active member of club or organization: Not on file    Attends meetings of clubs or organizations: Not on file    Relationship status: Not on file  Other Topics Concern   Not on file  Social History Narrative   Not on file     Family History:  The patient's family history includes Cerebral aneurysm in her mother; Hypertension in her mother.  ROS:   Review of Systems  Constitutional: Negative for chills, diaphoresis, fever, malaise/fatigue and weight loss.  HENT: Negative for congestion.   Eyes: Negative for discharge and redness.  Respiratory: Positive for shortness of breath. Negative for cough, hemoptysis, sputum production and wheezing.        Improved shortness of breath  Cardiovascular: Negative for chest pain, palpitations, orthopnea, claudication, leg swelling and PND.  Gastrointestinal: Negative for abdominal pain, blood in stool, heartburn, melena, nausea  and vomiting.  Genitourinary: Negative for hematuria.  Musculoskeletal: Negative for falls and myalgias.  Skin: Negative for rash.  Neurological: Positive for tingling. Negative for dizziness, tremors, sensory change, speech change, focal weakness, loss of consciousness and weakness.       Improved tingling in the left lower extremity/foot  Endo/Heme/Allergies: Does not bruise/bleed easily.  Psychiatric/Behavioral: Negative for substance abuse. The patient is not nervous/anxious.   All other systems reviewed and are negative.    EKGs/Labs/Other Studies Reviewed:    Studies reviewed were summarized above. The additional studies were reviewed today:  [facility] 02/15/2019: Coronary dominance: left dominant  Left mainstem:   Large vessel that bifurcates into the LAD and left circumflex, no significant disease noted  Left anterior descending (LAD):   Large vessel that extends to the apical region, diagonal branch 2 of moderate size, no significant disease noted, mild diffuse disease noted  Left circumflex (LCx):  Large vessel with OM branch 2, no significant disease noted, mild diffuse disease noted  Right coronary artery (RCA):  Right nondominant vessel with PL and PDA, no significant disease noted  Left ventriculography: valve crossed for pressures: normal gradient,  there is no significant mitral regurgitation , no significant aortic valve stenosis  Right heart cath performed: Measurements normal, detailed below LVEDP 25  Final Conclusions:   nonobstructive CAD Normal pulmonary pressures  Recommendations:  Will transition to warfarin, off eliquis for possible LV thrombus, recent hx of embolism, ischemic foot Will discuss with medical management as she does not have pharmacy insurance, 4 doses of Lovenox cost her >80$ Continue other medications __________  2D echo 01/2019: 1. The left ventricle has severely reduced systolic function, with an ejection fraction of 20-25%. The cavity  size was moderately dilated. There is mildly increased left ventricular wall thickness. Left ventricular diastolic Doppler parameters are  consistent with impaired relaxation. Left ventricular diffuse hypokinesis.  2. The right ventricle has normal systolic function. The cavity was normal. There is no increase in right ventricular wall thickness. Right ventricular systolic pressure is normal with an estimated pressure of 22.8 mmHg.  3. Mitral valve regurgitation is mild to moderate by color flow Doppler.  4. There is dilatation of the aortic root. 3.5 cm   EKG:  EKG is ordered today.  The EKG ordered today demonstrates NSR, 91 bpm, LBBB (known)  Recent Labs: 01/24/2019: ALT 19; B Natriuretic Peptide 854.0 01/26/2019: Magnesium 1.9; TSH 2.831 02/08/2019: Hemoglobin 11.1; Platelets 134 02/15/2019: BUN  40; Creatinine, Ser 2.14; Potassium 5.7; Sodium 135  Recent Lipid Panel    Component Value Date/Time   CHOL 123 01/26/2019 0035   TRIG 138 01/26/2019 0035   HDL 34 (L) 01/26/2019 0035   CHOLHDL 3.6 01/26/2019 0035   VLDL 28 01/26/2019 0035   LDLCALC 61 01/26/2019 0035    PHYSICAL EXAM:    VS:  BP 126/82 (BP Location: Left Arm, Patient Position: Sitting, Cuff Size: Normal)    Pulse 91    Ht 5\' 3"  (1.6 m)    Wt 118 lb 4 oz (53.6 kg)    BMI 20.95 kg/m   BMI: Body mass index is 20.95 kg/m.  Physical Exam  Constitutional: She is oriented to person, place, and time. She appears well-developed and well-nourished.  HENT:  Head: Normocephalic and atraumatic.  Eyes: Right eye exhibits no discharge. Left eye exhibits no discharge.  Neck: Normal range of motion. No JVD present.  Cardiovascular: Normal rate, regular rhythm, S1 normal, S2 normal and normal heart sounds. Exam reveals no distant heart sounds, no friction rub, no midsystolic click and no opening snap.  No murmur heard. Pulses:      Posterior tibial pulses are 2+ on the right side and 1+ on the left side.  Right femoral cardiac cath  site is well-healing without any active bleeding, bruising, swelling, warmth, erythema, or tenderness to palpation.  No bruit.  Pulmonary/Chest: Effort normal and breath sounds normal. No respiratory distress. She has no decreased breath sounds. She has no wheezes. She has no rales. She exhibits no tenderness.  Abdominal: Soft. She exhibits no distension. There is no abdominal tenderness.  Musculoskeletal:        General: No edema.  Neurological: She is alert and oriented to person, place, and time.  Skin: Skin is warm and dry. No cyanosis. Nails show no clubbing.  Psychiatric: She has a normal mood and affect. Her speech is normal and behavior is normal. Judgment and thought content normal.    Wt Readings from Last 3 Encounters:  02/28/19 118 lb 4 oz (53.6 kg)  02/27/19 117 lb (53.1 kg)  02/15/19 116 lb (52.6 kg)     ASSESSMENT & PLAN:   1. HFrEF secondary to nonischemic cardiomyopathy: Patient appears euvolemic and well compensated.  Weight has been stable at home.  She remains on carvedilol, losartan, hydralazine, and HCTZ.  Most recent labs demonstrated upward trending BUN and serum creatinine of 40 and 2.14 respectively with an approximate baseline serum creatinine around 1.7, as well as a potassium of 5.7.  In this setting, we will check a follow-up BMP today to trend her renal function and potassium.  If she is found to have continued worsening renal function/hyperkalemia we will hold her losartan and hydrochlorothiazide.  Depending on the potential degree of her hyperkalemia she may need to be evaluated in the ED.  She needs to schedule follow-up with nephrology.  Given her underlying acute on chronic kidney disease and hyperkalemia, we will not add spironolactone at this time.  We are unable to transition her from losartan to [facility] given that she receives medications from medication management and they are unable to obtain low-dose Entresto.  Look to add Imdur to her hydralazine and  follow-up to further optimize her medical therapy.  It is recommended that the patient should avoid pregnancy at this time given her cardiomyopathy and in the setting of current medications.  Plan to repeat limited echo in approximately 3 months on maximal tolerated evidence-based therapy.  If her EF remains less than 35% that time recommend referral to EP for consideration of CRT-D given her underlying left bundle.  2. Left lower extremity thromboembolism: Status post thrombo-embolectomy per vascular surgery as outlined above with symptomatic improvement.  Remains on Plavix and Eliquis.  Up to this point, the presumption has been a cardioembolic source, in the setting of LV dysfunction with concern for LV thrombus.  We did discuss in detail the fact that the patient remains on Eliquis for therapy with preference to being Coumadin.  Unfortunately, patient is unable to afford Lovenox bridge and would need to be bridged given this was an embolism.  In this setting, she prefers to remain on Eliquis.  She is aware of the sparsity of data with this medication.  We cannot completely exclude arrhythmia playing a role in her embolism with ZIO monitor currently pending.  Duration of Plavix is deferred to vascular surgery.  Follow-up as directed.    3. Acute on CKD stage III with hyperkalemia: Check BMP as outlined above.  If indicated would recommend discontinuation of losartan and HCTZ.  Follow-up with nephrology.  4. Pancytopenia: Uncertain etiology.  Check CBC.  Pending these results she may need to be referred to hematology.  5. HTN: Blood pressure is well controlled.  Continue current regimen as outlined above pending updated labs today.  6. Tobacco abuse: She continues to smoke 5 cigarettes/day.  Complete cessation is recommended.  7. Normal coronary arteries: No symptoms concerning for angina.  Continue primary prevention and aggressive risk factor modification including complete cessation of tobacco.  LDL  of 61 from 01/2019.  Disposition: F/u with Dr. Rockey Situ or an APP in 1 month.   Medication Adjustments/Labs and Tests Ordered: Current medicines are reviewed at length with the patient today.  Concerns regarding medicines are outlined above. Medication changes, Labs and Tests ordered today are summarized above and listed in the Patient Instructions accessible in Encounters.   Signed, Christell Faith, PA-C 02/28/2019 4:23 PM     [address], [place] [address] [phone]   Addendum after the patient had left the office.    I was thinking about her pancytopenia etiology and wondered if this could have been HIV.  I reviewed her chart and saw that she had tested positive for HIV during her recent admission and previously 1 year prior.  I then reviewed care everywhere and saw that her HIV diagnosis was made back in 2015 with the patient previously been on antiviral therapy.  I also noted that she had previously undergone echocardiogram in 2018 which showed an EF of 35%.  Patient was previously followed by infectious disease at [facility] though appears to have been lost to follow-up.  I have attempted to reach out to the patient to discuss this with the patient to recommend she reestablish care at [facility] or here locally with [facility].  Unfortunately, she did not answer her phone and I left a message for her to call our office back.  Second addendum: I was able to reach the patient over the phone and discussed her lab results with her.  We will have her change HCTZ to as needed and she will discontinue losartan.  We will check a BMP in 1 week.  Future order for lab will be placed.  [facility].  We will contact her with the results.  I also discussed with her the diagnosis of HIV  which she has known since 2015.  She is no longer followed at [facility] for this given she lives in the [place] area.  She has requested that I place a referral to  infectious disease locally for further management of her HIV.  Cannot exclude her nonischemic cardiomyopathy being in the setting of HIV.

## 2019-02-27 ENCOUNTER — Other Ambulatory Visit: Payer: Self-pay

## 2019-02-27 ENCOUNTER — Ambulatory Visit (INDEPENDENT_AMBULATORY_CARE_PROVIDER_SITE_OTHER): Payer: Self-pay | Admitting: Vascular Surgery

## 2019-02-27 ENCOUNTER — Encounter (INDEPENDENT_AMBULATORY_CARE_PROVIDER_SITE_OTHER): Payer: Self-pay | Admitting: Vascular Surgery

## 2019-02-27 ENCOUNTER — Ambulatory Visit (INDEPENDENT_AMBULATORY_CARE_PROVIDER_SITE_OTHER): Payer: Self-pay

## 2019-02-27 VITALS — BP 139/93 | HR 82 | Resp 10 | Ht 63.0 in | Wt 117.0 lb

## 2019-02-27 DIAGNOSIS — I739 Peripheral vascular disease, unspecified: Secondary | ICD-10-CM

## 2019-02-27 DIAGNOSIS — I10 Essential (primary) hypertension: Secondary | ICD-10-CM

## 2019-02-27 DIAGNOSIS — I749 Embolism and thrombosis of unspecified artery: Secondary | ICD-10-CM

## 2019-02-27 DIAGNOSIS — I509 Heart failure, unspecified: Secondary | ICD-10-CM

## 2019-02-27 NOTE — Progress Notes (Signed)
MRN : [id number]  Rebecca Bridges is a 43 y.o. (1975/06/22) female who presents with chief complaint of No chief complaint on file. Marland Kitchen  History of Present Illness:   The patient returns to the office for followup and review status post angiogram with intervention 01/25/2019.   Procedure(s) Performed: 1. Introduction catheter into left lower extremity 3rd order catheter placement  2. Contrast injection left lower extremity for distal runoff with additional 3rd order  3. Thromboembolectomy using the penumbra Cat Rx              4. Percutaneous transluminal angioplasty left anterior tibial to 3 mm  5. Star close closure right common femoral arteriotomy  Given her renal dysfunction and the very modest amount of contrast were used I elected to terminate the procedure at this point and can discuss attempts at salvaging the peroneal artery with the patient at another day which will minimize the contrast exposure.  The patient notes improvement in the lower extremity symptoms. No interval shortening of the patient's claudication distance or rest pain symptoms. Previous wounds have now healed.  No new ulcers or wounds have occurred since the last visit.  There have been no significant changes to the patient's overall health care.  The patient denies amaurosis fugax or recent TIA symptoms. There are no recent neurological changes noted. The patient denies history of DVT, PE or superficial thrombophlebitis. The patient denies recent episodes of angina or shortness of breath.   ABI's Rt=1.17 and Lt=1.13    No outpatient medications have been marked as taking for the 02/27/19 encounter (Appointment) with Delana Meyer, Dolores Lory, MD.    Past Medical History:  Diagnosis Date  . 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 L AT thromboembolecomty and PTA.  . Cardiomyopathy ([place])    a. a. 07/2017 Echo: EF 45-50%, Gr1 DD; b. 01/2019  Echo: EF 20-25%, mod dil LV. Impaired relaxation. Nl RV fxn. Mild to mod MR. Ao root 3.5cm.  . CKD (chronic kidney disease), stage III ([place])   . HFrEF (heart failure with reduced ejection fraction) ([place])    a. 07/2017 Echo: EF 45-50%, Gr1 DD; b. 01/2019 Echo: EF 20-25%.  Marland Kitchen Hypertension   . LBBB (left bundle branch block)   . Pancytopenia ([place])   . Tobacco abuse     Past Surgical History:  Procedure Laterality Date  . CHOLECYSTECTOMY    . LOWER EXTREMITY ANGIOGRAPHY Left 01/25/2019   Procedure: Lower Extremity Angiography;  Surgeon: Katha Cabal, MD;  Location: [facility];  Service: Cardiovascular;  Laterality: Left;  . RIGHT/LEFT HEART CATH AND CORONARY ANGIOGRAPHY Bilateral 02/15/2019   Procedure: RIGHT/LEFT HEART CATH AND CORONARY ANGIOGRAPHY;  Surgeon: Minna Merritts, MD;  Location: [facility];  Service: Cardiovascular;  Laterality: Bilateral;    Social History Social History   Tobacco Use  . Smoking status: Current Every Day Smoker    Types: Cigarettes  . Smokeless tobacco: Never Used  . Tobacco comment: 5 cigarettes a day  Substance Use Topics  . Alcohol use: No    Frequency: Never  . Drug use: No    Family History Family History  Problem Relation Age of Onset  . Hypertension Mother   . Cerebral aneurysm Mother     Allergies  Allergen Reactions  . Sulfa Antibiotics Rash     REVIEW OF SYSTEMS (Negative unless checked)  Constitutional: [] Weight loss  [] Fever  [] Chills Cardiac: [] Chest pain   [] Chest pressure   []   Palpitations   [] Shortness of breath when laying flat   [] Shortness of breath with exertion. Vascular:  [] Pain in legs with walking   [] Pain in legs at rest  [] History of DVT   [] Phlebitis   [] Swelling in legs   [] Varicose veins   [] Non-healing ulcers Pulmonary:   [] Uses home oxygen   [] Productive cough   [] Hemoptysis   [] Wheeze  [] COPD   [] Asthma Neurologic:  [] Dizziness   [] Seizures   [] History of stroke   [] History of TIA   [] Aphasia   [] Vissual changes   [] Weakness or numbness in arm   [] Weakness or numbness in leg Musculoskeletal:   [] Joint swelling   [] Joint pain   [] Low back pain Hematologic:  [] Easy bruising  [] Easy bleeding   [] Hypercoagulable state   [] Anemic Gastrointestinal:  [] Diarrhea   [] Vomiting  [] Gastroesophageal reflux/heartburn   [] Difficulty swallowing. Genitourinary:  [] Chronic kidney disease   [] Difficult urination  [] Frequent urination   [] Blood in urine Skin:  [] Rashes   [] Ulcers  Psychological:  [] History of anxiety   []  History of major depression.  Physical Examination  There were no vitals filed for this visit. There is no height or weight on file to calculate BMI. Gen: WD/WN, NAD Head: [place]/AT, No temporalis wasting.  Ear/Nose/Throat: Hearing grossly intact, nares w/o erythema or drainage Eyes: PER, EOMI, sclera nonicteric.  Neck: Supple, no large masses.   Pulmonary:  Good air movement, no audible wheezing bilaterally, no use of accessory muscles.  Cardiac: RRR, no JVD Vascular:  Vessel Right Left  PT Palpable Palpable  DP Palpable Palpable  Gastrointestinal: Non-distended. No guarding/no peritoneal signs.  Musculoskeletal: M/S 5/5 throughout.  No deformity or atrophy.  Neurologic: CN 2-12 intact. Symmetrical.  Speech is fluent. Motor exam as listed above. Psychiatric: Judgment intact, Mood & affect appropriate for pt's clinical situation. Dermatologic: No rashes or ulcers noted.  No changes consistent with cellulitis. Lymph : No lichenification or skin changes of chronic lymphedema.  CBC Lab Results  Component Value Date   WBC 3.3 (L) 02/08/2019   HGB 11.1 (L) 02/08/2019   HCT 34.7 (L) 02/08/2019   MCV 103.3 (H) 02/08/2019   PLT 134 (L) 02/08/2019    BMET    Component Value Date/Time   NA 135 02/15/2019 0817   K 5.7 (H) 02/15/2019 0817   CL 114 (H) 02/15/2019 0817   CO2 16 (L) 02/15/2019 0817   GLUCOSE 88 02/15/2019 0817   BUN 40 (H) 02/15/2019 0817    CREATININE 2.14 (H) 02/15/2019 0817   CALCIUM 7.9 (L) 02/15/2019 0817   GFRNONAA 28 (L) 02/15/2019 0817   GFRAA 32 (L) 02/15/2019 0817   Estimated Creatinine Clearance: 28.3 mL/min (A) (by C-G formula based on SCr of 2.14 mg/dL (H)).  COAG Lab Results  Component Value Date   INR 1.1 01/24/2019    Radiology No results found.   Assessment/Plan 1. 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  The patient reports that the claudication symptoms and leg pain is essentially gone.   The patient denies lifestyle limiting changes at this point in time.  No further invasive studies, angiography or surgery at this time The patient should continue walking and begin a more formal exercise program.  The patient should continue antiplatelet therapy and aggressive treatment of the lipid abnormalities  Smoking cessation was again discussed  The patient should continue wearing graduated compression socks 10-15 mmHg strength to control the mild edema.  Patient should undergo noninvasive studies as ordered. The patient will follow up with me after the studies.   - VAS [place] ABI WITH/WO TBI; Future - VAS [place] LOWER EXTREMITY ARTERIAL DUPLEX; Future  2. Chronic congestive heart failure, unspecified heart failure type ([place]) Continue cardiac and antihypertensive medications as already ordered and reviewed, no changes at this time.  Continue statin as ordered and reviewed, no changes at this time  Nitrates PRN for chest pain   3. Severe hypertension Continue antihypertensive medications as already ordered, these medications have been reviewed and there are no changes at this time.     Hortencia Pilar, MD  02/27/2019 9:05 AM

## 2019-02-28 ENCOUNTER — Encounter: Payer: Self-pay | Admitting: Physician Assistant

## 2019-02-28 ENCOUNTER — Ambulatory Visit (INDEPENDENT_AMBULATORY_CARE_PROVIDER_SITE_OTHER): Payer: Self-pay | Admitting: Physician Assistant

## 2019-02-28 ENCOUNTER — Other Ambulatory Visit
Admission: RE | Admit: 2019-02-28 | Discharge: 2019-02-28 | Disposition: A | Discharge status: Home / Self Care | Payer: Medicaid Other | Source: Ambulatory Visit | Attending: Physician Assistant | Admitting: Physician Assistant

## 2019-02-28 VITALS — BP 126/82 | HR 91 | Ht 63.0 in | Wt 118.2 lb

## 2019-02-28 DIAGNOSIS — I428 Other cardiomyopathies: Secondary | ICD-10-CM

## 2019-02-28 DIAGNOSIS — I749 Embolism and thrombosis of unspecified artery: Secondary | ICD-10-CM

## 2019-02-28 DIAGNOSIS — I5022 Chronic systolic (congestive) heart failure: Secondary | ICD-10-CM | POA: Diagnosis not present

## 2019-02-28 DIAGNOSIS — Z720 Tobacco use: Secondary | ICD-10-CM

## 2019-02-28 DIAGNOSIS — N179 Acute kidney failure, unspecified: Secondary | ICD-10-CM

## 2019-02-28 DIAGNOSIS — N183 Chronic kidney disease, stage 3 unspecified: Secondary | ICD-10-CM

## 2019-02-28 DIAGNOSIS — I447 Left bundle-branch block, unspecified: Secondary | ICD-10-CM

## 2019-02-28 DIAGNOSIS — N189 Chronic kidney disease, unspecified: Secondary | ICD-10-CM

## 2019-02-28 DIAGNOSIS — I10 Essential (primary) hypertension: Secondary | ICD-10-CM

## 2019-02-28 LAB — BASIC METABOLIC PANEL
Anion gap: 5 (ref 5–15)
BUN: 35 mg/dL — ABNORMAL HIGH (ref 6–20)
CO2: 17 mmol/L — ABNORMAL LOW (ref 22–32)
Calcium: 8.3 mg/dL — ABNORMAL LOW (ref 8.9–10.3)
Chloride: 109 mmol/L (ref 98–111)
Creatinine, Ser: 2.03 mg/dL — ABNORMAL HIGH (ref 0.44–1.00)
GFR calc Af Amer: 34 mL/min — ABNORMAL LOW (ref >60)
GFR calc non Af Amer: 29 mL/min — ABNORMAL LOW (ref >60)
Glucose, Bld: 88 mg/dL (ref 70–99)
Potassium: 5.4 mmol/L — ABNORMAL HIGH (ref 3.5–5.1)
Sodium: 131 mmol/L — ABNORMAL LOW (ref 135–145)

## 2019-02-28 LAB — CBC
HCT: 36 % (ref 36.0–46.0)
Hemoglobin: 11.8 g/dL — ABNORMAL LOW (ref 12.0–15.0)
MCH: 33.4 pg (ref 26.0–34.0)
MCHC: 32.8 g/dL (ref 30.0–36.0)
MCV: 102 fL — ABNORMAL HIGH (ref 80.0–100.0)
Platelets: 127 10*3/uL — ABNORMAL LOW (ref 150–400)
RBC: 3.53 MIL/uL — ABNORMAL LOW (ref 3.87–5.11)
RDW: 13.5 % (ref 11.5–15.5)
WBC: 3.9 10*3/uL — ABNORMAL LOW (ref 4.0–10.5)
nRBC: 0 % (ref 0.0–0.2)

## 2019-02-28 MED ORDER — CARVEDILOL 25 MG PO TABS: 25.0000 mg | ORAL_TABLET | Freq: Two times a day (BID) | ORAL | 3 refills | Status: DC

## 2019-02-28 MED ORDER — LOSARTAN POTASSIUM 25 MG PO TABS: 25.0000 mg | ORAL_TABLET | Freq: Every day | ORAL | 3 refills | Status: DC

## 2019-02-28 MED ORDER — CLOPIDOGREL BISULFATE 75 MG PO TABS: 75.0000 mg | ORAL_TABLET | Freq: Every day | ORAL | 3 refills | Status: DC

## 2019-02-28 NOTE — Patient Instructions (Signed)
Medication Instructions:  Your physician recommends that you continue on your current medications as directed. Please refer to the Current Medication list given to you today.  If you need a refill on your cardiac medications before your next appointment, please call your pharmacy.   Lab work: Your physician recommends that you return for lab work today at the medical mall.(BMET, CBC) No appt is needed. Hours are M-F 7AM- 6 PM.  If you have labs (blood work) drawn today and your tests are completely normal, you will receive your results only by: Marland Kitchen MyChart Message (if you have MyChart) OR . A paper copy in the mail If you have any lab test that is abnormal or we need to change your treatment, we will call you to review the results.  Testing/Procedures: None ordered   Follow-Up: At [facility], you and your health needs are our priority.  As part of our continuing mission to provide you with exceptional heart care, we have created designated Provider Care Teams.  These Care Teams include your primary Cardiologist (physician) and Advanced Practice Providers (APPs -  Physician Assistants and Nurse Practitioners) who all work together to provide you with the care you need, when you need it. You will need a follow up appointment in 1 months.  You may see Ida Rogue, MD or Christell Faith, PA-C.

## 2019-03-01 ENCOUNTER — Telehealth: Payer: Self-pay

## 2019-03-01 DIAGNOSIS — I10 Essential (primary) hypertension: Secondary | ICD-10-CM

## 2019-03-01 DIAGNOSIS — B20 Human immunodeficiency virus [HIV] disease: Secondary | ICD-10-CM

## 2019-03-01 MED ORDER — HYDROCHLOROTHIAZIDE 12.5 MG PO TABS: 12.5000 mg | ORAL_TABLET | Freq: Every day | ORAL | 3 refills | Status: DC | PRN

## 2019-03-01 NOTE — Telephone Encounter (Signed)
Following up on call from Christell Faith, [place] with patient. Orders placed.

## 2019-03-01 NOTE — Telephone Encounter (Signed)
-----   Message from Rise Mu, PA-C sent at 02/28/2019  5:22 PM EDT ----- I have discussed lab results with patient over the phone. Renal function is improving though does remain elevated. Potassium is elevated though improving. Blood counts remained low though are stable.  Her pancytopenia is likely in the setting of known HIV which has been present since 2015.  She was previously followed at [facility] for this though has been lost to follow-up given relocation to the [place] area.  She requested I place a referral to infectious disease locally for further management.  Please place referral to infectious disease, diagnosis HIV.    We cannot exclude the possibility of her nonischemic cardiomyopathy being in the setting of untreated HIV disease.  With resumption of antiplatelet therapy following establishment with ID we may need to revisit her anticoagulation/antiplatelet therapy.  This will have to be determined at that time.  I have recommended she take HCTZ only as, as needed for lower extremity swelling or shortness of breath.  She will hold losartan.  Please place order for follow-up BMP to be checked in 1 week in the [place].  Patient was given detailed instructions.  She will continue her plan to follow-up in 1 month.

## 2019-03-02 ENCOUNTER — Other Ambulatory Visit: Payer: Self-pay

## 2019-03-02 ENCOUNTER — Other Ambulatory Visit: Payer: Self-pay | Admitting: *Deleted

## 2019-03-02 DIAGNOSIS — I749 Embolism and thrombosis of unspecified artery: Secondary | ICD-10-CM

## 2019-03-06 ENCOUNTER — Telehealth: Payer: Self-pay | Admitting: Physician Assistant

## 2019-03-06 ENCOUNTER — Other Ambulatory Visit: Payer: Self-pay

## 2019-03-06 ENCOUNTER — Encounter: Payer: Self-pay | Admitting: Cardiovascular Disease

## 2019-03-06 ENCOUNTER — Ambulatory Visit (INDEPENDENT_AMBULATORY_CARE_PROVIDER_SITE_OTHER): Payer: Self-pay | Admitting: Cardiovascular Disease

## 2019-03-06 VITALS — BP 132/82 | HR 87 | Ht 63.0 in | Wt 117.0 lb

## 2019-03-06 DIAGNOSIS — I428 Other cardiomyopathies: Secondary | ICD-10-CM

## 2019-03-06 MED ORDER — CEPHALEXIN 500 MG PO CAPS: 500.0000 mg | ORAL_CAPSULE | Freq: Three times a day (TID) | ORAL | 0 refills | Status: DC

## 2019-03-06 NOTE — Telephone Encounter (Signed)
Incoming call from patient. She is post cath (9/2). Having pain, swelling, warmth and yellow discharge from cath site.   Denies fevers.   Pt wanting to know best next steps for care.   I told her that I will speak with Dr. Rockey Situ personally and will call her back with further recommendations.

## 2019-03-06 NOTE — Patient Instructions (Signed)
Medication Instructions:  Your physician has recommended you make the following change in your medication:  1- TAKE Keflex 500 mg by mouth three times a day for 7 days.  If you need a refill on your cardiac medications before your next appointment, please call your pharmacy.   Lab work: Your physician recommends that you return for lab work in: [place] (BMET).  If you have labs (blood work) drawn today and your tests are completely normal, you will receive your results only by: Marland Kitchen MyChart Message (if you have MyChart) OR . A paper copy in the mail If you have any lab test that is abnormal or we need to change your treatment, we will call you to review the results.  Testing/Procedures: NONE  Follow-Up: At [facility], you and your health needs are our priority.  As part of our continuing mission to provide you with exceptional heart care, we have created designated Provider Care Teams.  These Care Teams include your primary Cardiologist (physician) and Advanced Practice Providers (APPs -  Physician Assistants and Nurse Practitioners) who all work together to provide you with the care you need, when you need it. You will need a follow up appointment in [place] or please switch to in office day.   You may see Ida Rogue, MD or one of the following Advanced Practice Providers on your designated Care Team:   Murray Hodgkins, NP Christell Faith, PA-C . Marrianne Mood, PA-C

## 2019-03-06 NOTE — Telephone Encounter (Signed)
Spoke to Dr. Rockey Situ about patients sx. He wanted her to come into office today for complaint.   Call to patient and confirmed appt time today at 1:40 Pm  She denied any fever, cough or covid exposure. Knows to come 20 min early for appt, through the medical mall.   Advised pt to call for any further questions or concerns.   Made provider aware that visit was confirmed with patient.

## 2019-03-06 NOTE — Telephone Encounter (Signed)
Patient calling States that the site where cath was done is now swollen - believes it to be infected Would like to know if necessary to go to ER  Please call to discuss

## 2019-03-06 NOTE — Progress Notes (Signed)
Cardiology Office Note  Date:  03/06/2019   ID:  Rebecca Bridges, DOB [birth year], MRN [id number]  PCP:  Patient, No Pcp Per   Chief Complaint  Patient presents with  . Other    She is post cath (9/2). Having pain, swelling, warmth and yellow discharge from cath site. Meds reviewed verbally with patient.     HPI:  Rebecca Bridges is a 43 y.o. female with history of nonischemic cardiomyopathy, left lower extremity arterial occlusion, CKD stage III, left bundle branch block, pancytopenia, hypertension, and tobacco abuse who presents for follow-up of diagnostic cath.  In the past day or so she reports having a boil right groin at the catheterization site.  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  PSH:    Past Surgical History:  Procedure Laterality Date  . CHOLECYSTECTOMY    . LOWER EXTREMITY ANGIOGRAPHY Left 01/25/2019   Procedure: Lower Extremity Angiography;  Surgeon: Katha Cabal, MD;  Location: [place] [facility];  Service: Cardiovascular;  Laterality: Left;  . RIGHT/LEFT HEART CATH AND CORONARY ANGIOGRAPHY Bilateral 02/15/2019   Procedure: RIGHT/LEFT HEART CATH AND CORONARY ANGIOGRAPHY;  Surgeon: Minna Merritts, MD;  Location: [place] [facility];  Service: Cardiovascular;  Laterality: Bilateral;    Current Outpatient Medications  Medication Sig Dispense Refill  . apixaban (ELIQUIS) 5 MG TABS tablet Take 1 tablet (5 mg total) by mouth 2  (two) times daily. 60 tablet 1  . carvedilol (COREG) 25 MG tablet Take 1 tablet (25 mg total) by mouth 2 (two) times daily with a meal. 60 tablet 3  . clopidogrel (PLAVIX) 75 MG tablet Take 1 tablet (75 mg total) by mouth daily with breakfast. 30 tablet 3  . enoxaparin (LOVENOX) 60 MG/0.6ML injection Inject 0.6 mLs (60 mg total) into the skin every 12 (twelve) hours. 2.4 mL 1  . hydrALAZINE (APRESOLINE) 25 MG tablet Take 1 tablet (25 mg total) by mouth every 8 (eight) hours. 90 tablet 0  . hydrochlorothiazide (HYDRODIURIL) 12.5 MG tablet Take 1 tablet (12.5 mg total) by mouth daily as needed (for weight gain, swelling or SOB.). 30 tablet 3  . cephALEXin (KEFLEX) 500 MG capsule Take 1 capsule (500 mg total) by mouth 3 (three) times daily for 7 days. 21 capsule 0   No current facility-administered medications for this visit.      Allergies:   Sulfa antibiotics   Social History:  The patient  reports that she has been smoking cigarettes. She has never used smokeless tobacco. She reports that she does not drink alcohol or use drugs.   Family History:   family history includes Cerebral aneurysm in her mother; Hypertension in her mother.    Review of Systems: Review of Systems  Constitutional: Negative.   HENT: Negative.   Respiratory: Negative.   Cardiovascular: Negative.   Gastrointestinal: Negative.   Musculoskeletal: Negative.        Right groin discomfort  Neurological: Negative.   Psychiatric/Behavioral: Negative.   All other  systems reviewed and are negative.    PHYSICAL EXAM: VS:  BP 132/82 (BP Location: Left Arm, Patient Position: Sitting, Cuff Size: Normal)   Pulse 87   Ht 5\' 3"  (1.6 m)   Wt 117 lb (53.1 kg)   SpO2 99%   BMI 20.73 kg/m  , BMI Body mass index is 20.73 kg/m. Full exam not performed, on examined right groin   Recent Labs: 01/24/2019: ALT 19; B Natriuretic Peptide 854.0 01/26/2019: Magnesium 1.9; TSH 2.831 02/28/2019: BUN 35; Creatinine, Ser 2.03;  Hemoglobin 11.8; Platelets 127; Potassium 5.4; Sodium 131    Lipid Panel Lab Results  Component Value Date   CHOL 123 01/26/2019   HDL 34 (L) 01/26/2019   LDLCALC 61 01/26/2019   TRIG 138 01/26/2019      Wt Readings from Last 3 Encounters:  03/06/19 117 lb (53.1 kg)  02/28/19 118 lb 4 oz (53.6 kg)  02/27/19 117 lb (53.1 kg)       ASSESSMENT AND PLAN:  Problem List Items Addressed This Visit    None    Visit Diagnoses    Nonischemic cardiomyopathy ([place])    -  Primary     Right groin infection post catheterization, small boil noted Pus coming out on palpation with discomfort Recommended Keflex 500 3 times daily for 7 days Suggested she call our office back if symptoms persist  Disposition:   F/U 3 months      Signed, Esmond Plants, M.D., Ph.D. [place], [place]

## 2019-03-07 LAB — BASIC METABOLIC PANEL
BUN/Creatinine Ratio: 17 (ref 9–23)
BUN: 41 mg/dL — ABNORMAL HIGH (ref 6–24)
CO2: 16 mmol/L — ABNORMAL LOW (ref 20–29)
Calcium: 8.3 mg/dL — ABNORMAL LOW (ref 8.7–10.2)
Chloride: 104 mmol/L (ref 96–106)
Creatinine, Ser: 2.38 mg/dL — ABNORMAL HIGH (ref 0.57–1.00)
GFR calc Af Amer: 28 mL/min/{1.73_m2} — ABNORMAL LOW (ref >59)
GFR calc non Af Amer: 24 mL/min/{1.73_m2} — ABNORMAL LOW (ref >59)
Glucose: 93 mg/dL (ref 65–99)
Potassium: 5.7 mmol/L — ABNORMAL HIGH (ref 3.5–5.2)
Sodium: 132 mmol/L — ABNORMAL LOW (ref 134–144)

## 2019-03-09 ENCOUNTER — Encounter: Payer: Self-pay | Admitting: Emergency Medicine

## 2019-03-09 ENCOUNTER — Emergency Department
Admission: EM | Admit: 2019-03-09 | Discharge: 2019-03-09 | Disposition: A | Discharge status: Home / Self Care | Payer: Medicaid Other | Attending: Emergency Medicine | Admitting: Emergency Medicine

## 2019-03-09 ENCOUNTER — Telehealth: Payer: Self-pay | Admitting: *Deleted

## 2019-03-09 ENCOUNTER — Other Ambulatory Visit: Payer: Self-pay

## 2019-03-09 DIAGNOSIS — I5022 Chronic systolic (congestive) heart failure: Secondary | ICD-10-CM | POA: Diagnosis not present

## 2019-03-09 DIAGNOSIS — N183 Chronic kidney disease, stage 3 unspecified: Secondary | ICD-10-CM

## 2019-03-09 DIAGNOSIS — E875 Hyperkalemia: Secondary | ICD-10-CM

## 2019-03-09 DIAGNOSIS — I130 Hypertensive heart and chronic kidney disease with heart failure and stage 1 through stage 4 chronic kidney disease, or unspecified chronic kidney disease: Secondary | ICD-10-CM | POA: Insufficient documentation

## 2019-03-09 DIAGNOSIS — Z21 Asymptomatic human immunodeficiency virus [HIV] infection status: Secondary | ICD-10-CM | POA: Diagnosis not present

## 2019-03-09 DIAGNOSIS — Z79899 Other long term (current) drug therapy: Secondary | ICD-10-CM | POA: Diagnosis not present

## 2019-03-09 DIAGNOSIS — H9202 Otalgia, left ear: Secondary | ICD-10-CM | POA: Diagnosis present

## 2019-03-09 DIAGNOSIS — H66002 Acute suppurative otitis media without spontaneous rupture of ear drum, left ear: Secondary | ICD-10-CM | POA: Insufficient documentation

## 2019-03-09 DIAGNOSIS — F17210 Nicotine dependence, cigarettes, uncomplicated: Secondary | ICD-10-CM | POA: Diagnosis not present

## 2019-03-09 MED ORDER — AMOXICILLIN 875 MG PO TABS: 875.0000 mg | ORAL_TABLET | Freq: Two times a day (BID) | ORAL | 0 refills | Status: DC

## 2019-03-09 NOTE — ED Triage Notes (Signed)
Here with c/o left ear pain. NAD.

## 2019-03-09 NOTE — Telephone Encounter (Signed)
-----   Message from Minna Merritts, MD sent at 03/08/2019 10:17 PM EDT ----- Labs Renal function still running high Would suggest referral to renal (may have been placed already) Avoid NSAIDS Avoid high potassium foods, Hold HCTZ

## 2019-03-09 NOTE — ED Provider Notes (Signed)
[facility] Emergency Department Provider Note  ____________________________________________   None    (approximate)  I have reviewed the triage vital signs and the nursing notes.   HISTORY  Chief Complaint Otalgia   HPI Rebecca Bridges is a 43 y.o. female presents to the ED with complaint of left ear pain that started 2 days ago.  Patient states that she had nasal congestion and began taking some allergy medication.  She has had a subjective fever at home.  She states that this morning that her left ear was hurting worse than it was last night.  Currently she rates her pain as a 9/10.       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 L AT thromboembolecomty and PTA.   Cardiomyopathy ([place])    a. a. 07/2017 Echo: EF 45-50%, Gr1 DD; b. 01/2019 Echo: EF 20-25%, mod dil LV. Impaired relaxation. Nl RV fxn. Mild to mod MR. Ao root 3.5cm.   CKD (chronic kidney disease), stage III (HCC)    HFrEF (heart failure with reduced ejection fraction) ([place])    a. 07/2017 Echo: EF 45-50%, Gr1 DD; b. 01/2019 Echo: EF 20-25%.   HIV (human immunodeficiency virus infection) ([place])    a.  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[place]) 01/26/2019   Left leg pain 01/24/2019   Acute on chronic renal failure ([place]) 01/24/2019   HTN (hypertension), malignant 08/07/2017   Nonintractable headache 03/17/2017   Atypical squamous cell changes of undetermined significance (ASCUS) on cervical cytology with positive high risk human  papilloma virus (HPV) 03/03/2016   Proteinuria 07/12/2015   Acute kidney injury ([place]) 03/14/2015   Non compliance with medical treatment 03/14/2015   Prolonged Q-T interval on ECG 03/14/2015   Thrombocytopenia ([place]) 03/14/2015   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   Tobacco abuse 11/28/2013   Human immunodeficiency virus (HIV) disease ([place]) 11/28/2013   Hypokalemia 11/27/2013   Hyponatremia 11/27/2013   Lymphadenopathy 11/27/2013   Pneumonia 11/27/2013   Splenomegaly 11/27/2013   CHF (congestive heart failure) ([place]) 11/26/2013   Severe hypertension 11/26/2013   Acute on chronic systolic CHF (congestive heart failure) ([place]) 11/26/2013    Past Surgical History:  Procedure Laterality Date   CHOLECYSTECTOMY     LOWER EXTREMITY ANGIOGRAPHY Left 01/25/2019   Procedure: Lower Extremity Angiography;  Surgeon: Katha Cabal, MD;  Location: [facility];  Service: Cardiovascular;  Laterality: Left;   RIGHT/LEFT HEART CATH AND CORONARY ANGIOGRAPHY Bilateral 02/15/2019   Procedure: RIGHT/LEFT HEART CATH AND CORONARY ANGIOGRAPHY;  Surgeon: Minna Merritts, MD;  Location: [facility];  Service: Cardiovascular;  Laterality: Bilateral;    Prior to Admission medications   Medication Sig Start Date End Date Taking? Authorizing Provider  amoxicillin (AMOXIL) 875 MG tablet Take 1 tablet (875 mg total) by mouth 2 (two) times daily. 03/09/19   Johnn Hai, PA-C  apixaban (ELIQUIS) 5 MG TABS tablet Take 1 tablet (5 mg total)  by mouth 2 (two) times daily. 01/26/19   Demetrios Loll, MD  carvedilol (COREG) 25 MG tablet Take 1 tablet (25 mg total) by mouth 2 (two) times daily with a meal. 02/28/19   Dunn, Areta Haber, PA-C  clopidogrel (PLAVIX) 75 MG tablet Take 1 tablet (75 mg total) by mouth daily with breakfast. 02/28/19   Dunn, Areta Haber, PA-C  hydrALAZINE (APRESOLINE) 25 MG tablet Take 1 tablet (25 mg total) by mouth every 8 (eight) hours. 01/26/19   Demetrios Loll, MD    hydrochlorothiazide (HYDRODIURIL) 12.5 MG tablet Take 1 tablet (12.5 mg total) by mouth daily as needed (for weight gain, swelling or SOB.). 03/01/19   Rise Mu, PA-C    Allergies Sulfa antibiotics  Family History  Problem Relation Age of Onset   Hypertension Mother    Cerebral aneurysm Mother     Social History Social History   Tobacco Use   Smoking status: Current Every Day Smoker    Types: Cigarettes   Smokeless tobacco: Never Used   Tobacco comment: 5 cigarettes a day  Substance Use Topics   Alcohol use: No    Frequency: Never   Drug use: No    Review of Systems Constitutional: No fever/chills Eyes: No visual changes. ENT: Negative for sore throat.  Positive for left ear pain. Cardiovascular: Denies chest pain. Respiratory: Denies shortness of breath. Gastrointestinal: No abdominal pain.  No nausea, no vomiting.   Musculoskeletal: Negative for muscle aches. Skin: Negative for rash. Neurological: Negative for headaches, focal weakness or numbness. ____________________________________________   PHYSICAL EXAM:  VITAL SIGNS: ED Triage Vitals  Enc Vitals Group     BP 03/09/19 0736 (!) 143/94     Pulse Rate 03/09/19 0736 84     Resp 03/09/19 0736 16     Temp 03/09/19 0736 98 F (36.7 C)     Temp Source 03/09/19 0736 Oral     SpO2 03/09/19 0736 100 %     Weight 03/09/19 0733 118 lb (53.5 kg)     Height 03/09/19 0733 5\' 3"  (1.6 m)     Head Circumference --      Peak Flow --      Pain Score 03/09/19 0733 9     Pain Loc --      Pain Edu? --      Excl. in [place]? --    Constitutional: Alert and oriented. Well appearing and in no acute distress. Eyes: Conjunctivae are normal.  Head: Atraumatic. Nose: No congestion/rhinnorhea.  Right EAC with mild cerumen present.  TM is dull but no erythema is noted.  Left TM is mildly erythematous with poor light reflex. Mouth/Throat: Mucous membranes are moist.  Oropharynx non-erythematous. Neck: No stridor.    Cardiovascular: Normal rate, regular rhythm. Grossly normal heart sounds.  Good peripheral circulation. Respiratory: Normal respiratory effort.  No retractions. Lungs CTAB. Neurologic:  Normal speech and language. No gross focal neurologic deficits are appreciated. No gait instability. Skin:  Skin is warm, dry and intact.  Psychiatric: Mood and affect are normal. Speech and behavior are normal.  ____________________________________________   LABS (all labs ordered are listed, but only abnormal results are displayed)  Labs Reviewed - No data to display   PROCEDURES  Procedure(s) performed (including Critical Care):  Procedures   ____________________________________________   INITIAL IMPRESSION / ASSESSMENT AND PLAN / ED COURSE  As part of my medical decision making, I reviewed the following data within the electronic MEDICAL RECORD NUMBER Notes from prior ED visits  and [place] Controlled Substance Database  43 year old female presents to the ED with complaint of left ear pain and nasal congestion for several days with worsening of her left ear pain this morning.  Patient has a history of ear infections in the past but none recently.  Physical exam was consistent with an otitis media and patient was placed on amoxicillin 875 twice daily for 10 days.  She is to follow-up with her PCP or [facility] if any continued problems.  ____________________________________________   FINAL CLINICAL IMPRESSION(S) / ED DIAGNOSES  Final diagnoses:  Non-recurrent acute suppurative otitis media of left ear without spontaneous rupture of tympanic membrane     ED Discharge Orders         Ordered    amoxicillin (AMOXIL) 875 MG tablet  2 times daily     03/09/19 0758           Note:  This document was prepared using Dragon voice recognition software and may include unintentional dictation errors.    Johnn Hai, PA-C 03/09/19 1052    Earleen Newport, MD 03/09/19 1053

## 2019-03-09 NOTE — ED Notes (Signed)
See triage note  Presents with pain to left ear and jaw area  States pain started couple of days ago  Afebrile on arrival

## 2019-03-09 NOTE — Discharge Instructions (Addendum)
Follow-up with your primary care provider or Dr. Pryor Ochoa at [facility] if any continued problems.  Continue taking Tylenol or ibuprofen if needed for ear pain.  Begin taking amoxicillin 875 twice daily for 10 days.  Do not stop taking the antibiotic until completely finished.

## 2019-03-13 NOTE — Telephone Encounter (Signed)
Attempted to call patient. LMTCB 03/13/2019

## 2019-03-15 NOTE — Addendum Note (Signed)
Addended by: Alvis Lemmings C on: 03/15/2019 12:39 PM   Modules accepted: Orders

## 2019-03-15 NOTE — Telephone Encounter (Signed)
Attempted to call [facility] to see if they could see our referral in Epic or if I needed to fax the patient's information over for the referral.  No answer- I left a message to please call back to confirm.

## 2019-03-15 NOTE — Telephone Encounter (Signed)
I have spoken with the patient regarding her lab results from 03/06/19. She is aware of Dr. Donivan Scull recommendations to: 1) Avoid NSAIDS 2) Avoid high potassium foods (list mailed to the patient as well) 3) Hold HCTZ- she has been taking this PRN, but I advised her to avoid this for now.  She does confirm she saw a kidney specialist at [place] last year, but would prefer to see someone locally. I advised her I will place this referral and she should hear directly from the nephrology office to schedule.  I have asked her to please come in in the next day or so for a repeat BMP at the [facility]. She is agreeable and voices understanding.  BMP order placed.

## 2019-03-16 ENCOUNTER — Telehealth: Payer: Self-pay | Admitting: Licensed Clinical Social Worker

## 2019-03-16 NOTE — Telephone Encounter (Signed)
Left message for patient to call back to schedule an appointment for a referral sent by [facility].

## 2019-03-24 NOTE — Telephone Encounter (Signed)
Records faxed to [facility] at [phone]. Confirmation received.

## 2019-03-24 NOTE — Telephone Encounter (Signed)
I called and spoke with [facility] today. They confirm they cannot see Epic referrals at this time. We will need to fax a referral/ records to [phone].

## 2019-03-29 ENCOUNTER — Other Ambulatory Visit: Payer: Self-pay

## 2019-03-29 ENCOUNTER — Telehealth: Payer: Self-pay | Admitting: Cardiovascular Disease

## 2019-04-05 ENCOUNTER — Telehealth: Payer: Self-pay | Admitting: Cardiovascular Disease

## 2019-04-05 ENCOUNTER — Other Ambulatory Visit: Payer: Self-pay

## 2019-04-05 MED ORDER — CLOPIDOGREL BISULFATE 75 MG PO TABS: 75.0000 mg | ORAL_TABLET | Freq: Every day | ORAL | 0 refills | Status: DC

## 2019-04-05 MED ORDER — HYDRALAZINE HCL 25 MG PO TABS: 25.0000 mg | ORAL_TABLET | Freq: Three times a day (TID) | ORAL | 0 refills | Status: DC

## 2019-04-05 NOTE — Telephone Encounter (Signed)
Requested Prescriptions   Signed Prescriptions Disp Refills  . clopidogrel (PLAVIX) 75 MG tablet 90 tablet 0    Sig: Take 1 tablet (75 mg total) by mouth daily with breakfast.    Authorizing Provider: Minna Merritts    Ordering User: Janan Ridge hydrALAZINE (APRESOLINE) 25 MG tablet 90 tablet 0    Sig: Take 1 tablet (25 mg total) by mouth every 8 (eight) hours.    Authorizing Provider: Minna Merritts    Ordering User: Janan Ridge

## 2019-04-05 NOTE — Telephone Encounter (Signed)
°*  STAT* If patient is at the pharmacy, call can be transferred to refill team.   1. Which medications need to be refilled? (please list name of each medication and dose if known)  Hydralazine 25 Mg  Clopidogrel 75 MG   2. Which pharmacy/location (including street and city if local pharmacy) is medication to be sent to? [facility] on [address]   3. Do they need a 30 day or 90 day supply? 90 day

## 2019-04-11 ENCOUNTER — Telehealth: Payer: Self-pay | Admitting: Infectious Diseases

## 2019-04-11 NOTE — Telephone Encounter (Signed)
Referral from [facility]

## 2019-04-21 ENCOUNTER — Encounter: Payer: Self-pay | Admitting: *Deleted

## 2019-04-23 ENCOUNTER — Other Ambulatory Visit: Payer: Self-pay

## 2019-04-23 ENCOUNTER — Emergency Department: Payer: Medicaid Other

## 2019-04-23 ENCOUNTER — Emergency Department
Admission: EM | Admit: 2019-04-23 | Discharge: 2019-04-23 | Disposition: A | Discharge status: Home / Self Care | Payer: Medicaid Other | Attending: Student | Admitting: Student

## 2019-04-23 DIAGNOSIS — Z21 Asymptomatic human immunodeficiency virus [HIV] infection status: Secondary | ICD-10-CM | POA: Diagnosis not present

## 2019-04-23 DIAGNOSIS — R0601 Orthopnea: Secondary | ICD-10-CM | POA: Diagnosis not present

## 2019-04-23 DIAGNOSIS — I130 Hypertensive heart and chronic kidney disease with heart failure and stage 1 through stage 4 chronic kidney disease, or unspecified chronic kidney disease: Secondary | ICD-10-CM | POA: Diagnosis not present

## 2019-04-23 DIAGNOSIS — Z79899 Other long term (current) drug therapy: Secondary | ICD-10-CM | POA: Insufficient documentation

## 2019-04-23 DIAGNOSIS — Z7901 Long term (current) use of anticoagulants: Secondary | ICD-10-CM | POA: Insufficient documentation

## 2019-04-23 DIAGNOSIS — I5023 Acute on chronic systolic (congestive) heart failure: Secondary | ICD-10-CM | POA: Insufficient documentation

## 2019-04-23 DIAGNOSIS — F17210 Nicotine dependence, cigarettes, uncomplicated: Secondary | ICD-10-CM | POA: Insufficient documentation

## 2019-04-23 DIAGNOSIS — R0602 Shortness of breath: Secondary | ICD-10-CM | POA: Diagnosis present

## 2019-04-23 DIAGNOSIS — N183 Chronic kidney disease, stage 3 unspecified: Secondary | ICD-10-CM | POA: Diagnosis not present

## 2019-04-23 DIAGNOSIS — I509 Heart failure, unspecified: Secondary | ICD-10-CM

## 2019-04-23 LAB — CBC
HCT: 30.2 % — ABNORMAL LOW (ref 36.0–46.0)
Hemoglobin: 9.8 g/dL — ABNORMAL LOW (ref 12.0–15.0)
MCH: 31 pg (ref 26.0–34.0)
MCHC: 32.5 g/dL (ref 30.0–36.0)
MCV: 95.6 fL (ref 80.0–100.0)
Platelets: 106 10*3/uL — ABNORMAL LOW (ref 150–400)
RBC: 3.16 MIL/uL — ABNORMAL LOW (ref 3.87–5.11)
RDW: 13.8 % (ref 11.5–15.5)
WBC: 3 10*3/uL — ABNORMAL LOW (ref 4.0–10.5)
nRBC: 0 % (ref 0.0–0.2)

## 2019-04-23 LAB — BRAIN NATRIURETIC PEPTIDE: B Natriuretic Peptide: >4500 pg/mL — ABNORMAL HIGH (ref 0.0–100.0)

## 2019-04-23 LAB — BASIC METABOLIC PANEL
Anion gap: 6 (ref 5–15)
BUN: 44 mg/dL — ABNORMAL HIGH (ref 6–20)
CO2: 14 mmol/L — ABNORMAL LOW (ref 22–32)
Calcium: 7.7 mg/dL — ABNORMAL LOW (ref 8.9–10.3)
Chloride: 111 mmol/L (ref 98–111)
Creatinine, Ser: 2.54 mg/dL — ABNORMAL HIGH (ref 0.44–1.00)
GFR calc Af Amer: 26 mL/min — ABNORMAL LOW (ref >60)
GFR calc non Af Amer: 22 mL/min — ABNORMAL LOW (ref >60)
Glucose, Bld: 106 mg/dL — ABNORMAL HIGH (ref 70–99)
Potassium: 5.4 mmol/L — ABNORMAL HIGH (ref 3.5–5.1)
Sodium: 131 mmol/L — ABNORMAL LOW (ref 135–145)

## 2019-04-23 LAB — TROPONIN I (HIGH SENSITIVITY): Troponin I (High Sensitivity): 22 ng/L — ABNORMAL HIGH (ref <18)

## 2019-04-23 MED ORDER — SODIUM CHLORIDE 0.9% FLUSH: 3.0000 mL | Freq: Once | INTRAVENOUS | Status: DC

## 2019-04-23 MED ORDER — FUROSEMIDE 10 MG/ML IJ SOLN
40.0000 mg | Freq: Once | INTRAMUSCULAR | Status: AC
  Administered 2019-04-23: 40 mg via INTRAVENOUS
  Filled 2019-04-23: qty 4

## 2019-04-23 MED ORDER — HYDROCHLOROTHIAZIDE 12.5 MG PO TABS: 12.5000 mg | ORAL_TABLET | Freq: Every day | ORAL | 3 refills | Status: DC

## 2019-04-23 NOTE — ED Triage Notes (Signed)
Pt c/o SOB over the past couple days , states she has a hx of CHF and was taken off fluid pill about a month ago. States it is worse when she lays down.,

## 2019-04-23 NOTE — ED Notes (Signed)
Pt ambulated and maintained 100% oxygen saturation while walking. MD aware.

## 2019-04-23 NOTE — ED Provider Notes (Signed)
[facility] Emergency Department Provider Note  ____________________________________________  Time seen: Approximately 12:08 PM  I have reviewed the triage vital signs and the nursing notes.   HISTORY  Chief Complaint Shortness of Breath    HPI Rebecca Bridges is a 43 y.o. female who presents the emergency department complaining of 2 days of worsening shortness of breath when laying down.  Patient states that she has a sensation of being unable to breathe when she lays flat.  As long as she has had an angle, upright patient denies any shortness of breath feelings.  Patient states that when she lays down she will also experience some intermittent chest pain.  Patient is unable to describe the sensation other than "it just hurts in my chest."  Patient states that when she again sits up or is at a reclined angle she does not experience any chest pain.  Patient denies any headache, neck pain, abdominal pain, nausea or vomiting.  Patient denies any fevers or chills, nasal congestion or sore throat.  She states that she does have a slight cough over the last 2 days.  Patient denies any edema about the upper or lower extremities.  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 L AT thromboembolecomty and PTA.  . Cardiomyopathy ([place])    a. a. 07/2017 Echo: EF 45-50%, Gr1 DD; b. 01/2019 Echo: EF 20-25%, mod dil LV. Impaired relaxation. Nl RV fxn. Mild to mod MR. Ao root 3.5cm.  . CHF (congestive heart failure) ([place])   . CKD (chronic kidney disease), stage III   . HFrEF (heart failure with reduced ejection fraction) ([place])    a. 07/2017 Echo: EF 45-50%, Gr1 DD; b. 01/2019 Echo: EF 20-25%.  Marland Kitchen HIV (human immunodeficiency  virus infection) ([place])    a.  Diagnosed in 2015  . Hypertension   . LBBB (left bundle branch block)   . Pancytopenia ([place])   . Tobacco abuse     Patient Active Problem List   Diagnosis Date Noted  . Dilated cardiomyopathy ([place]) 02/15/2019  . 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 Cardiomyopathy ([place])   . CKD (chronic kidney disease), stage III   . LBBB (left bundle branch block)   . Ischemic leg 01/26/2019  . Embolism of artery of lower extremity ([place]) 01/26/2019  . Left leg pain 01/24/2019  . Acute on chronic renal failure ([place]) 01/24/2019  . HTN (hypertension), malignant 08/07/2017  . Nonintractable headache 03/17/2017  . Atypical squamous cell changes of undetermined significance (ASCUS) on cervical cytology with positive high risk human papilloma virus (HPV) 03/03/2016  . Proteinuria 07/12/2015  . Acute kidney injury ([place]) 03/14/2015  . Non compliance with medical treatment 03/14/2015  . Prolonged Q-T interval on ECG 03/14/2015  . Thrombocytopenia ([place]) 03/14/2015  . 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  . Tobacco abuse 11/28/2013  . Human immunodeficiency virus (HIV) disease ([place]) 11/28/2013  . Hypokalemia 11/27/2013  . Hyponatremia 11/27/2013  . Lymphadenopathy 11/27/2013  . Pneumonia 11/27/2013  . Splenomegaly 11/27/2013  . CHF (congestive heart failure) ([place]) 11/26/2013  . Severe hypertension 11/26/2013  . Acute on chronic systolic CHF (congestive heart failure) ([place]) 11/26/2013    Past Surgical History:  Procedure Laterality Date  .  CHOLECYSTECTOMY    . LOWER EXTREMITY ANGIOGRAPHY Left 01/25/2019   Procedure: Lower Extremity Angiography;  Surgeon: Katha Cabal, MD;  Location: [place] [facility];  Service: Cardiovascular;  Laterality: Left;  . RIGHT/LEFT HEART CATH AND CORONARY ANGIOGRAPHY Bilateral 02/15/2019   Procedure: RIGHT/LEFT HEART CATH AND CORONARY ANGIOGRAPHY;  Surgeon: Minna Merritts, MD;  Location: [place] [facility];  Service:  Cardiovascular;  Laterality: Bilateral;    Prior to Admission medications   Medication Sig Start Date End Date Taking? Authorizing Provider  amoxicillin (AMOXIL) 875 MG tablet Take 1 tablet (875 mg total) by mouth 2 (two) times daily. 03/09/19   Johnn Hai, PA-C  apixaban (ELIQUIS) 5 MG TABS tablet Take 1 tablet (5 mg total) by mouth 2 (two) times daily. 01/26/19   Demetrios Loll, MD  carvedilol (COREG) 25 MG tablet Take 1 tablet (25 mg total) by mouth 2 (two) times daily with a meal. 02/28/19   Dunn, Areta Haber, PA-C  clopidogrel (PLAVIX) 75 MG tablet Take 1 tablet (75 mg total) by mouth daily with breakfast. 04/05/19   Minna Merritts, MD  hydrALAZINE (APRESOLINE) 25 MG tablet Take 1 tablet (25 mg total) by mouth every 8 (eight) hours. 04/05/19   Minna Merritts, MD  hydrochlorothiazide (HYDRODIURIL) 12.5 MG tablet Take 1 tablet (12.5 mg total) by mouth daily as needed (for weight gain, swelling or SOB.). 03/01/19   Rise Mu, PA-C  hydrochlorothiazide (HYDRODIURIL) 12.5 MG tablet Take 1 tablet (12.5 mg total) by mouth daily. 04/23/19   Graylyn Bunney, Charline Bills, PA-C    Allergies Sulfa antibiotics  Family History  Problem Relation Age of Onset  . Hypertension Mother   . Cerebral aneurysm Mother     Social History Social History   Tobacco Use  . Smoking status: Current Every Day Smoker    Types: Cigarettes  . Smokeless tobacco: Never Used  . Tobacco comment: 5 cigarettes a day  Substance Use Topics  . Alcohol use: No    Frequency: Never  . Drug use: No     Review of Systems  Constitutional: No fever/chills Eyes: No visual changes. No discharge ENT: No upper respiratory complaints. Cardiovascular: Nonspecific chest pain when laying down. Respiratory: Positive for shortness of breath when lying down.  Intermittent cough. Gastrointestinal: No abdominal pain.  No nausea, no vomiting.  No diarrhea.  No constipation. Genitourinary: Negative for dysuria. No  hematuria Musculoskeletal: Negative for musculoskeletal pain. Skin: Negative for rash, abrasions, lacerations, ecchymosis. Neurological: Negative for headaches, focal weakness or numbness. 10-point ROS otherwise negative.  ____________________________________________   PHYSICAL EXAM:  VITAL SIGNS: ED Triage Vitals [04/23/19 0804]  Enc Vitals Group     BP (!) 160/103     Pulse Rate 100     Resp 18     Temp 98.7 F (37.1 C)     Temp Source Oral     SpO2 100 %     Weight 118 lb (53.5 kg)     Height 5\' 3"  (1.6 m)     Head Circumference      Peak Flow      Pain Score 0     Pain Loc      Pain Edu?      Excl. in [place]?      Constitutional: Alert and oriented. Well appearing and in no acute distress. Eyes: Conjunctivae are normal. PERRL. EOMI. Head: Atraumatic. ENT:      Ears: EACs and TMs unremarkable bilaterally.  Nose: No congestion/rhinnorhea.      Mouth/Throat: Mucous membranes are moist.  Oropharynx is nonerythematous and nonedematous. Neck: No stridor.  Neck is supple full range of motion Hematological/Lymphatic/Immunilogical: No cervical lymphadenopathy. Cardiovascular: Normal rate, regular rhythm. Normal S1 and S2.  No murmurs, rubs, gallops.  Good peripheral circulation.  No peripheral edema.  Pulses intact all 4 extremities.   Respiratory: Normal respiratory effort without tachypnea or retractions. Lungs CTAB with no wheezing, rales, rhonchi.Kermit Balo air entry to the bases with no decreased or absent breath sounds. Gastrointestinal: Bowel sounds 4 quadrants. Soft and nontender to palpation. No guarding or rigidity. No palpable masses. No distention.  Musculoskeletal: Full range of motion to all extremities. No gross deformities appreciated. Neurologic:  Normal speech and language. No gross focal neurologic deficits are appreciated.  Skin:  Skin is warm, dry and intact. No rash noted. Psychiatric: Mood and affect are normal. Speech and behavior are normal. Patient  exhibits appropriate insight and judgement.   ____________________________________________   LABS (all labs ordered are listed, but only abnormal results are displayed)  Labs Reviewed  BASIC METABOLIC PANEL - Abnormal; Notable for the following components:      Result Value   Sodium 131 (*)    Potassium 5.4 (*)    CO2 14 (*)    Glucose, Bld 106 (*)    BUN 44 (*)    Creatinine, Ser 2.54 (*)    Calcium 7.7 (*)    GFR calc non Af Amer 22 (*)    GFR calc Af Amer 26 (*)    All other components within normal limits  CBC - Abnormal; Notable for the following components:   WBC 3.0 (*)    RBC 3.16 (*)    Hemoglobin 9.8 (*)    HCT 30.2 (*)    Platelets 106 (*)    All other components within normal limits  BRAIN NATRIURETIC PEPTIDE - Abnormal; Notable for the following components:   B Natriuretic Peptide >4,500.0 (*)    All other components within normal limits  TROPONIN I (HIGH SENSITIVITY) - Abnormal; Notable for the following components:   Troponin I (High Sensitivity) 22 (*)    All other components within normal limits  POC URINE PREG, ED  TROPONIN I (HIGH SENSITIVITY)   ____________________________________________  EKG  ED ECG REPORT I, Charline Bills Trayshawn Durkin,  personally viewed and interpreted this ECG.   Date: 04/23/2019  EKG Time: 0803 hours  Rate: 99 bpm  Rhythm: unchanged from previous tracings, normal sinus rhythm  Axis: Left axis deviation  Intervals:nonspecific intraventricular conduction delay, increased QTc interval  ST&T Change: No ST elevation or depression noted  Normal sinus rhythm. Nonspecific intraventricular block with slightly widened QRS identified on previous ECG.  QTC of 521.  No stemi  ____________________________________________  RADIOLOGY I personally viewed and evaluated these images as part of my medical decision making, as well as reviewing the written report by the radiologist.  Dg Chest 2 View  Result Date: 04/23/2019 CLINICAL DATA:   History of lower extremity blood clot. Shortness of breath. EXAM: CHEST - 2 VIEW COMPARISON:  Chest radiograph 01/25/2019 FINDINGS: Stable enlarged cardiac and mediastinal contours. No consolidative pulmonary opacities. No pleural effusion or pneumothorax. Thoracic spine degenerative changes. Cholecystectomy clips. IMPRESSION: Cardiomegaly. No acute cardiopulmonary process. Electronically Signed   By: Lovey Newcomer M.D.   On: 04/23/2019 09:38    ____________________________________________    PROCEDURES  Procedure(s) performed:    Procedures    Medications  sodium chloride flush (  NS) 0.9 % injection 3 mL (3 mLs Intravenous Refused 04/23/19 1315)  furosemide (LASIX) injection 40 mg (40 mg Intravenous Given 04/23/19 1346)     ____________________________________________   INITIAL IMPRESSION / ASSESSMENT AND PLAN / ED COURSE  Pertinent labs & imaging results that were available during my care of the patient were reviewed by me and considered in my medical decision making (see chart for details).  Review of the [place] CSRS was performed in accordance of the [place] prior to dispensing any controlled drugs.           Patient's diagnosis is consistent with CHF exacerbation.  Patient presented to the emergency department complaining of orthopnea x2 days.  Patient states that she has had intermittent chest pain when lying flat associated with her orthopnea.  Patient states that as long as she is upright or resting at an angle she does not have any symptoms.  She is currently denying any chest pain, shortness of breath, domino pain.  No URI symptoms.  Labs reveal findings consistent with CHF exacerbation.  Patient has a BNP of 4500.  No peripheral edema.  No edema identified on chest x-ray.  Patient is maintained her O2 saturations in the upper 90s or 100% on room air throughout the course of stay.  Patient's other labs are consistent with previous labs on record.  Patient does have chronic kidney  disease with elevated creatinine.  Patient has mild hyperkalemia which is consistent with patient's baseline.  At this time patient will be treated with dose of Lasix and restarted on her HCTZ.  Patient states that her cardiologist "forgot" to refill her HCTZ, however on review of medical record, it appears that cardiology had refilled her HCTZ in September with an additional three refills.  Patient states that she has been out of the medication but there was no discussion on her stopping the medication.  Patient will have follow-up tomorrow with congestive heart failure clinic.  I discussed the results with the patient and she verbalizes understanding of same.  She will follow up with congestive heart failure clinic tomorrow.  Understands signs and symptoms to return to the emergency department for.. Patient will be discharged home with prescriptions for HCTZ. Patient is to follow up with CHF clinic or cardiology as needed or otherwise directed. Patient is given ED precautions to return to the ED for any worsening or new symptoms.     ____________________________________________  FINAL CLINICAL IMPRESSION(S) / ED DIAGNOSES  Final diagnoses:  Acute on chronic congestive heart failure, unspecified heart failure type (HCC)  Stage 3 chronic kidney disease, unspecified whether stage 3a or 3b CKD  Orthopnea      NEW MEDICATIONS STARTED DURING THIS VISIT:  ED Discharge Orders         Ordered    hydrochlorothiazide (HYDRODIURIL) 12.5 MG tablet  Daily     04/23/19 1508              This chart was dictated using voice recognition software/Dragon. Despite best efforts to proofread, errors can occur which can change the meaning. Any change was purely unintentional.    Darletta Moll, PA-C 04/23/19 2135    Lilia Pro., MD 04/24/19 1705

## 2019-04-25 NOTE — Progress Notes (Signed)
Patient ID: Rebecca Bridges, female    DOB: 1976/01/10, 43 y.o.   MRN: [id number]  HPI  Rebecca Bridges is a 43 y/o female with a history of HTN, CKD, HIV, current tobacco use and chronic heart failure.   Echo report from 01/27/2019 reviewed and showed an EF of 20-25%.  Catheterization done 02/15/2019 and showed: Right heart cath performed: Measurements normal, detailed below LVEDP 25 Final Conclusions:   nonobstructive CAD Normal pulmonary pressures  Was in the ED 04/23/2019 due to acute on chronic heart failure. Troponin minimally elevated thought to be due to demand ischemia. Received IV lasix and was released.   She presents today for her initial visit with a chief complaint of moderate fatigue upon minimal exertion. She describes this as chronic in nature having been present for several months. She has associated shortness of breath and difficulty sleeping along with this. She denies any dizziness, abdominal distention, palpitations, pedal edema or chest pain. Doesn't have any scales at home. Overall feels a little better since her recent ED visit with resumption of her HCTZ.   Past Medical History:  Diagnosis Date  . 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 L AT thromboembolecomty and PTA.  . Cardiomyopathy ([place])    a. a. 07/2017 Echo: EF 45-50%, Gr1 DD; b. 01/2019 Echo: EF 20-25%, mod dil LV. Impaired relaxation. Nl RV fxn. Mild to mod MR. Ao root 3.5cm.  . CHF (congestive heart failure) ([place])   . CKD (chronic kidney disease), stage III   . HFrEF (heart failure with reduced ejection fraction) ([place])    a. 07/2017 Echo: EF 45-50%, Gr1 DD; b. 01/2019 Echo: EF 20-25%.  Marland Kitchen HIV (human immunodeficiency virus infection) ([place])    a.  Diagnosed in 2015  . Hypertension   . LBBB (left bundle branch block)   . Pancytopenia ([place])   . Tobacco abuse    Past Surgical History:  Procedure Laterality Date  . CHOLECYSTECTOMY    . LOWER EXTREMITY ANGIOGRAPHY Left 01/25/2019   Procedure: Lower  Extremity Angiography;  Surgeon: Katha Cabal, MD;  Location: [place] [facility];  Service: Cardiovascular;  Laterality: Left;  . RIGHT/LEFT HEART CATH AND CORONARY ANGIOGRAPHY Bilateral 02/15/2019   Procedure: RIGHT/LEFT HEART CATH AND CORONARY ANGIOGRAPHY;  Surgeon: Minna Merritts, MD;  Location: [place] [facility];  Service: Cardiovascular;  Laterality: Bilateral;   Family History  Problem Relation Age of Onset  . Hypertension Mother   . Cerebral aneurysm Mother    Social History   Tobacco Use  . Smoking status: Current Every Day Smoker    Types: Cigarettes  . Smokeless tobacco: Never Used  . Tobacco comment: 5 cigarettes a day  Substance Use Topics  . Alcohol use: No    Frequency: Never   Allergies  Allergen Reactions  . Sulfa Antibiotics Rash   Prior to Admission medications   Medication Sig Start Date End Date Taking? Authorizing Provider  apixaban (ELIQUIS) 5 MG TABS tablet Take 1 tablet (5 mg total) by mouth 2 (two) times daily. 01/26/19  Yes Demetrios Loll, MD  carvedilol (COREG) 25 MG tablet Take 1 tablet (25 mg total) by mouth 2 (two) times daily with a meal. 02/28/19  Yes Dunn, Areta Haber, PA-C  clopidogrel (PLAVIX) 75 MG tablet Take 1 tablet (75 mg total) by mouth daily with breakfast. 04/05/19  Yes Gollan, Kathlene November, MD  hydrALAZINE (APRESOLINE) 25 MG tablet Take 1 tablet (25 mg total) by mouth every 8 (eight)  hours. 04/05/19  Yes Gollan, Kathlene November, MD  hydrochlorothiazide (HYDRODIURIL) 12.5 MG tablet Take 1 tablet (12.5 mg total) by mouth daily. 04/23/19  Yes Cuthriell, Charline Bills, PA-C     Review of Systems  Constitutional: Positive for fatigue (easily). Negative for appetite change.  HENT: Negative for congestion, postnasal drip and sore throat.   Eyes: Negative.   Respiratory: Positive for shortness of breath. Negative for cough.   Cardiovascular: Negative for chest pain, palpitations and leg swelling.  Gastrointestinal: Negative for abdominal distention and  abdominal pain.  Endocrine: Negative.   Genitourinary: Negative.   Musculoskeletal: Negative for back pain and neck pain.  Skin: Negative.   Allergic/Immunologic: Negative.   Neurological: Negative for dizziness and light-headedness.  Hematological: Negative for adenopathy. Does not bruise/bleed easily.  Psychiatric/Behavioral: Positive for sleep disturbance (sleeping on 3 pillows). Negative for dysphoric mood. The patient is not nervous/anxious.    Vitals:   04/26/19 1135 04/26/19 1136  BP: (!) 146/109 140/90  Pulse: 96   Resp: 20   SpO2: 100%   Weight: 121 lb 12.8 oz (55.2 kg)   Height: 5\' 3"  (1.6 m)    Wt Readings from Last 3 Encounters:  04/26/19 121 lb 12.8 oz (55.2 kg)  04/23/19 118 lb (53.5 kg)  03/09/19 118 lb (53.5 kg)   Lab Results  Component Value Date   CREATININE 2.54 (H) 04/23/2019   CREATININE 2.38 (H) 03/06/2019   CREATININE 2.03 (H) 02/28/2019     Physical Exam Vitals signs and nursing note reviewed.  Constitutional:      Appearance: Normal appearance.  HENT:     Head: Normocephalic and atraumatic.  Neck:     Musculoskeletal: Normal range of motion and neck supple.  Cardiovascular:     Rate and Rhythm: Normal rate and regular rhythm.  Pulmonary:     Effort: Pulmonary effort is normal. No respiratory distress.     Breath sounds: No wheezing or rales.  Abdominal:     General: There is no distension.     Palpations: Abdomen is soft.     Tenderness: There is no abdominal tenderness.  Musculoskeletal:        General: No tenderness.     Right lower leg: No edema.     Left lower leg: No edema.  Skin:    General: Skin is warm and dry.  Neurological:     General: No focal deficit present.     Mental Status: She is alert and oriented to person, place, and time.  Psychiatric:        Mood and Affect: Mood normal.        Behavior: Behavior normal.     Assessment & Plan:  1: Chronic heart failure with reduced ejection fraction- - NYHA class III -  euvolemic today - does not have scales so a set was given to her today; instructed to weigh every morning, write the weight down and call for an overnight weight gain of >2 pounds or a weekly weight gain of >5 pounds - not adding salt but hasn't been reading food labels; does admit that she likes potato chips as well as ice cream - reviewed the importance of not adding salt and reading food labels to keep daily sodium intake to 2000mg  / day. Stressed the importance of looking at serving sizes and limiting high sodium foods like potato chips; written dietary information as well as a low sodium cookbook were given to her - HCTZ recently resumed by ED provider so  will check BMP today - says that she drinks ~ 80-96 ounces of fluid daily; reviewed the importance and rationale about decreasing her daily fluid intake to closer to 60 ounces daily which would include anything that is liquid at room temperature or juicy fruit (oranges) - had telemedicine visit with cardiology Rockey Situ) 03/29/2019 - unable to use entresto due to renal function - BNP 04/23/2019 was >4500.0  2: HTN- - BP elevated today but had improved somewhat after recheck with a manual cuff - consider increasing hydralazine at next visit; hesitant to use ACEi/ ARB due to CKD - BMP 04/23/2019 reviewed and showed sodium 131, potassium 5.4, creatinine 2.54 and GFR 26  3: Tobacco use- - smokes 1 pack of cigarettes every 3 days - complete cessation discussed for 3 minutes with her  4: CKD stage IV- - patient says that she has paperwork for a nephrology referral but she hasn't called yet - emphasized that she needed to call and get an appointment scheduled  Patient did not bring her medications nor a list. Each medication was verbally reviewed with the patient and she was encouraged to bring the bottles to every visit to confirm accuracy of list.  Return in 6 weeks or sooner for any questions/problems before then.

## 2019-04-26 ENCOUNTER — Ambulatory Visit: Payer: Medicaid Other | Attending: Family | Admitting: Family

## 2019-04-26 ENCOUNTER — Encounter: Payer: Self-pay | Admitting: Family

## 2019-04-26 ENCOUNTER — Other Ambulatory Visit: Payer: Self-pay

## 2019-04-26 VITALS — BP 140/90 | HR 96 | Resp 20 | Ht 63.0 in | Wt 121.8 lb

## 2019-04-26 DIAGNOSIS — I2510 Atherosclerotic heart disease of native coronary artery without angina pectoris: Secondary | ICD-10-CM | POA: Diagnosis not present

## 2019-04-26 DIAGNOSIS — I130 Hypertensive heart and chronic kidney disease with heart failure and stage 1 through stage 4 chronic kidney disease, or unspecified chronic kidney disease: Secondary | ICD-10-CM | POA: Insufficient documentation

## 2019-04-26 DIAGNOSIS — Z21 Asymptomatic human immunodeficiency virus [HIV] infection status: Secondary | ICD-10-CM | POA: Insufficient documentation

## 2019-04-26 DIAGNOSIS — F17210 Nicotine dependence, cigarettes, uncomplicated: Secondary | ICD-10-CM | POA: Insufficient documentation

## 2019-04-26 DIAGNOSIS — I5022 Chronic systolic (congestive) heart failure: Secondary | ICD-10-CM | POA: Diagnosis present

## 2019-04-26 DIAGNOSIS — Z8249 Family history of ischemic heart disease and other diseases of the circulatory system: Secondary | ICD-10-CM | POA: Insufficient documentation

## 2019-04-26 DIAGNOSIS — I429 Cardiomyopathy, unspecified: Secondary | ICD-10-CM | POA: Diagnosis not present

## 2019-04-26 DIAGNOSIS — Z7902 Long term (current) use of antithrombotics/antiplatelets: Secondary | ICD-10-CM | POA: Diagnosis not present

## 2019-04-26 DIAGNOSIS — I10 Essential (primary) hypertension: Secondary | ICD-10-CM

## 2019-04-26 DIAGNOSIS — N184 Chronic kidney disease, stage 4 (severe): Secondary | ICD-10-CM | POA: Insufficient documentation

## 2019-04-26 DIAGNOSIS — Z882 Allergy status to sulfonamides status: Secondary | ICD-10-CM | POA: Diagnosis not present

## 2019-04-26 DIAGNOSIS — Z79899 Other long term (current) drug therapy: Secondary | ICD-10-CM | POA: Insufficient documentation

## 2019-04-26 DIAGNOSIS — Z720 Tobacco use: Secondary | ICD-10-CM

## 2019-04-26 DIAGNOSIS — Z7901 Long term (current) use of anticoagulants: Secondary | ICD-10-CM | POA: Insufficient documentation

## 2019-04-26 LAB — BASIC METABOLIC PANEL
Anion gap: 6 (ref 5–15)
BUN: 51 mg/dL — ABNORMAL HIGH (ref 6–20)
CO2: 15 mmol/L — ABNORMAL LOW (ref 22–32)
Calcium: 7.9 mg/dL — ABNORMAL LOW (ref 8.9–10.3)
Chloride: 111 mmol/L (ref 98–111)
Creatinine, Ser: 2.69 mg/dL — ABNORMAL HIGH (ref 0.44–1.00)
GFR calc Af Amer: 24 mL/min — ABNORMAL LOW (ref >60)
GFR calc non Af Amer: 21 mL/min — ABNORMAL LOW (ref >60)
Glucose, Bld: 90 mg/dL (ref 70–99)
Potassium: 5.4 mmol/L — ABNORMAL HIGH (ref 3.5–5.1)
Sodium: 132 mmol/L — ABNORMAL LOW (ref 135–145)

## 2019-04-26 NOTE — Patient Instructions (Signed)
Continue weighing daily and call for an overnight weight gain of > 2 pounds or a weekly weight gain of >5 pounds. 

## 2019-04-28 ENCOUNTER — Telehealth: Payer: Self-pay | Admitting: Family

## 2019-04-28 ENCOUNTER — Encounter: Payer: Self-pay | Admitting: Family

## 2019-04-28 NOTE — Telephone Encounter (Signed)
Patient called back and said she has scheduled an appointment with [facility] for 06/06/2019 as that was the soonest they could get her in. She also called [facility] nephrology who also said it would be sometime in December. A referral was faxed to [facility].

## 2019-06-06 ENCOUNTER — Other Ambulatory Visit: Payer: Self-pay | Admitting: Nephrology

## 2019-06-06 DIAGNOSIS — R808 Other proteinuria: Secondary | ICD-10-CM

## 2019-06-06 DIAGNOSIS — R319 Hematuria, unspecified: Secondary | ICD-10-CM

## 2019-06-10 NOTE — Progress Notes (Deleted)
Patient ID: Rebecca Bridges, female    DOB: July 16, 1975, 43 y.o.   MRN: [id number]  HPI  Rebecca Bridges is a 43 y/o female with a history of HTN, CKD, HIV, current tobacco use and chronic heart failure.   Echo report from 01/27/2019 reviewed and showed an EF of 20-25%.  Catheterization done 02/15/2019 and showed: Right heart cath performed: Measurements normal, detailed below LVEDP 25 Final Conclusions:   nonobstructive CAD Normal pulmonary pressures  Was in the ED 04/23/2019 due to acute on chronic heart failure. Troponin minimally elevated thought to be due to demand ischemia. Received IV lasix and was released.   She presents today for a follow-up visit with a chief complaint of   Past Medical History:  Diagnosis Date  . 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 L AT thromboembolecomty and PTA.  . Cardiomyopathy ([place])    a. a. 07/2017 Echo: EF 45-50%, Gr1 DD; b. 01/2019 Echo: EF 20-25%, mod dil LV. Impaired relaxation. Nl RV fxn. Mild to mod MR. Ao root 3.5cm.  . CHF (congestive heart failure) ([place])   . CKD (chronic kidney disease), stage III   . HFrEF (heart failure with reduced ejection fraction) ([place])    a. 07/2017 Echo: EF 45-50%, Gr1 DD; b. 01/2019 Echo: EF 20-25%.  Marland Kitchen HIV (human immunodeficiency virus infection) ([place])    a.  Diagnosed in 2015  . Hypertension   . LBBB (left bundle branch block)   . Pancytopenia ([place])   . Tobacco abuse    Past Surgical History:  Procedure Laterality Date  . CHOLECYSTECTOMY    . LOWER EXTREMITY ANGIOGRAPHY Left 01/25/2019   Procedure: Lower Extremity Angiography;  Surgeon: Katha Cabal, MD;  Location: [facility];  Service: Cardiovascular;  Laterality: Left;  . RIGHT/LEFT HEART CATH AND CORONARY ANGIOGRAPHY Bilateral 02/15/2019   Procedure: RIGHT/LEFT HEART CATH AND CORONARY ANGIOGRAPHY;  Surgeon: Minna Merritts, MD;  Location: [facility];  Service: Cardiovascular;  Laterality: Bilateral;   Family History  Problem  Relation Age of Onset  . Hypertension Mother   . Cerebral aneurysm Mother    Social History   Tobacco Use  . Smoking status: Current Every Day Smoker    Types: Cigarettes  . Smokeless tobacco: Never Used  . Tobacco comment: 5 cigarettes a day  Substance Use Topics  . Alcohol use: No   Allergies  Allergen Reactions  . Sulfa Antibiotics Rash      Review of Systems  Constitutional: Positive for fatigue (easily). Negative for appetite change.  HENT: Negative for congestion, postnasal drip and sore throat.   Eyes: Negative.   Respiratory: Positive for shortness of breath. Negative for cough.   Cardiovascular: Negative for chest pain, palpitations and leg swelling.  Gastrointestinal: Negative for abdominal distention and abdominal pain.  Endocrine: Negative.   Genitourinary: Negative.   Musculoskeletal: Negative for back pain and neck pain.  Skin: Negative.   Allergic/Immunologic: Negative.   Neurological: Negative for dizziness and light-headedness.  Hematological: Negative for adenopathy. Does not bruise/bleed easily.  Psychiatric/Behavioral: Positive for sleep disturbance (sleeping on 3 pillows). Negative for dysphoric mood. The patient is not nervous/anxious.       Physical Exam Vitals and nursing note reviewed.  Constitutional:      Appearance: Normal appearance.  HENT:     Head: Normocephalic and atraumatic.  Cardiovascular:     Rate and Rhythm: Normal rate and regular rhythm.  Pulmonary:     Effort: Pulmonary effort  is normal. No respiratory distress.     Breath sounds: No wheezing or rales.  Abdominal:     General: There is no distension.     Palpations: Abdomen is soft.     Tenderness: There is no abdominal tenderness.  Musculoskeletal:        General: No tenderness.     Cervical back: Normal range of motion and neck supple.     Right lower leg: No edema.     Left lower leg: No edema.  Skin:    General: Skin is warm and dry.  Neurological:      General: No focal deficit present.     Mental Status: She is alert and oriented to person, place, and time.  Psychiatric:        Mood and Affect: Mood normal.        Behavior: Behavior normal.     Assessment & Plan:  1: Chronic heart failure with reduced ejection fraction- - NYHA class III - euvolemic today - weighing daily; reminded to call for an overnight weight gain of >2 pounds or a weekly weight gain of >5 pounds - weight 121.12 from last visit here 6 weeks ago - not adding salt but hasn't been reading food labels; does admit that she likes potato chips as well as ice cream - reviewed the importance of not adding salt and reading food labels to keep daily sodium intake to 2000mg  / day. Stressed the importance of looking at serving sizes and limiting high sodium foods like potato chips - says that she drinks ~ 80-96 ounces of fluid daily; reviewed the importance and rationale about decreasing her daily fluid intake to closer to 60 ounces daily which would include anything that is liquid at room temperature or juicy fruit (oranges) - had telemedicine visit with cardiology Rockey Situ) 03/29/2019 - unable to use entresto due to renal function - BNP 04/23/2019 was >4500.0  2: HTN- - BP  - consider increasing hydralazine at next visit; hesitant to use ACEi/ ARB due to CKD - BMP 04/26/2019 reviewed and showed sodium 132, potassium 5.4, creatinine 2.69 and GFR 24  3: Tobacco use- - smokes 1 pack of cigarettes every 3 days - complete cessation discussed for 3 minutes with her  4: CKD stage IV- - had appointment with [facility] 06/06/2019  Patient did not bring her medications nor a list. Each medication was verbally reviewed with the patient and she was encouraged to bring the bottles to every visit to confirm accuracy of list.  Return in 6 weeks or sooner for any questions/problems before then.

## 2019-06-12 ENCOUNTER — Ambulatory Visit: Payer: Self-pay | Admitting: Family

## 2019-06-12 ENCOUNTER — Telehealth: Payer: Self-pay | Admitting: Family

## 2019-06-12 NOTE — Telephone Encounter (Signed)
Patient did not show for her [facility] appointment on 06/12/2019. Will attempt to reschedule.

## 2019-06-13 ENCOUNTER — Emergency Department
Admission: EM | Admit: 2019-06-13 | Discharge: 2019-06-13 | Disposition: A | Discharge status: Home / Self Care | Payer: Medicaid Other | Attending: Emergency Medicine | Admitting: Emergency Medicine

## 2019-06-13 ENCOUNTER — Emergency Department: Payer: Medicaid Other

## 2019-06-13 ENCOUNTER — Encounter: Payer: Self-pay | Admitting: Emergency Medicine

## 2019-06-13 ENCOUNTER — Other Ambulatory Visit: Payer: Self-pay

## 2019-06-13 DIAGNOSIS — F17210 Nicotine dependence, cigarettes, uncomplicated: Secondary | ICD-10-CM | POA: Insufficient documentation

## 2019-06-13 DIAGNOSIS — Z79899 Other long term (current) drug therapy: Secondary | ICD-10-CM | POA: Diagnosis not present

## 2019-06-13 DIAGNOSIS — I5022 Chronic systolic (congestive) heart failure: Secondary | ICD-10-CM | POA: Insufficient documentation

## 2019-06-13 DIAGNOSIS — N183 Chronic kidney disease, stage 3 unspecified: Secondary | ICD-10-CM | POA: Insufficient documentation

## 2019-06-13 DIAGNOSIS — N289 Disorder of kidney and ureter, unspecified: Secondary | ICD-10-CM | POA: Diagnosis not present

## 2019-06-13 DIAGNOSIS — I130 Hypertensive heart and chronic kidney disease with heart failure and stage 1 through stage 4 chronic kidney disease, or unspecified chronic kidney disease: Secondary | ICD-10-CM | POA: Diagnosis not present

## 2019-06-13 DIAGNOSIS — Z21 Asymptomatic human immunodeficiency virus [HIV] infection status: Secondary | ICD-10-CM | POA: Diagnosis not present

## 2019-06-13 DIAGNOSIS — Z7901 Long term (current) use of anticoagulants: Secondary | ICD-10-CM | POA: Diagnosis not present

## 2019-06-13 DIAGNOSIS — R0602 Shortness of breath: Secondary | ICD-10-CM | POA: Diagnosis present

## 2019-06-13 LAB — COMPREHENSIVE METABOLIC PANEL
ALT: 33 U/L (ref 0–44)
AST: 58 U/L — ABNORMAL HIGH (ref 15–41)
Albumin: 2.2 g/dL — ABNORMAL LOW (ref 3.5–5.0)
Alkaline Phosphatase: 119 U/L (ref 38–126)
Anion gap: 5 (ref 5–15)
BUN: 72 mg/dL — ABNORMAL HIGH (ref 6–20)
CO2: 17 mmol/L — ABNORMAL LOW (ref 22–32)
Calcium: 7.5 mg/dL — ABNORMAL LOW (ref 8.9–10.3)
Chloride: 108 mmol/L (ref 98–111)
Creatinine, Ser: 3.8 mg/dL — ABNORMAL HIGH (ref 0.44–1.00)
GFR calc Af Amer: 16 mL/min — ABNORMAL LOW (ref >60)
GFR calc non Af Amer: 14 mL/min — ABNORMAL LOW (ref >60)
Glucose, Bld: 126 mg/dL — ABNORMAL HIGH (ref 70–99)
Potassium: 5.2 mmol/L — ABNORMAL HIGH (ref 3.5–5.1)
Sodium: 130 mmol/L — ABNORMAL LOW (ref 135–145)
Total Bilirubin: 0.5 mg/dL (ref 0.3–1.2)
Total Protein: 8.7 g/dL — ABNORMAL HIGH (ref 6.5–8.1)

## 2019-06-13 LAB — URINALYSIS, COMPLETE (UACMP) WITH MICROSCOPIC
Bilirubin Urine: NEGATIVE
Glucose, UA: NEGATIVE mg/dL
Hgb urine dipstick: NEGATIVE
Ketones, ur: NEGATIVE mg/dL
Nitrite: NEGATIVE
Protein, ur: >=300 mg/dL — AB
Specific Gravity, Urine: 1.01 (ref 1.005–1.030)
pH: 6 (ref 5.0–8.0)

## 2019-06-13 LAB — CBC
HCT: 31.8 % — ABNORMAL LOW (ref 36.0–46.0)
Hemoglobin: 9.8 g/dL — ABNORMAL LOW (ref 12.0–15.0)
MCH: 29.5 pg (ref 26.0–34.0)
MCHC: 30.8 g/dL (ref 30.0–36.0)
MCV: 95.8 fL (ref 80.0–100.0)
Platelets: 120 10*3/uL — ABNORMAL LOW (ref 150–400)
RBC: 3.32 MIL/uL — ABNORMAL LOW (ref 3.87–5.11)
RDW: 15.9 % — ABNORMAL HIGH (ref 11.5–15.5)
WBC: 2.8 10*3/uL — ABNORMAL LOW (ref 4.0–10.5)
nRBC: 0 % (ref 0.0–0.2)

## 2019-06-13 LAB — LIPASE, BLOOD: Lipase: 83 U/L — ABNORMAL HIGH (ref 11–51)

## 2019-06-13 LAB — TROPONIN I (HIGH SENSITIVITY)
Troponin I (High Sensitivity): 36 ng/L — ABNORMAL HIGH (ref <18)
Troponin I (High Sensitivity): 38 ng/L — ABNORMAL HIGH (ref <18)

## 2019-06-13 LAB — PREGNANCY, URINE: Preg Test, Ur: NEGATIVE

## 2019-06-13 MED ORDER — FUROSEMIDE 40 MG PO TABS: 40.0000 mg | ORAL_TABLET | Freq: Every day | ORAL | 0 refills | Status: DC

## 2019-06-13 MED ORDER — FUROSEMIDE 40 MG PO TABS
40.0000 mg | ORAL_TABLET | Freq: Once | ORAL | Status: AC
  Administered 2019-06-13: 40 mg via ORAL
  Filled 2019-06-13: qty 1

## 2019-06-13 MED ORDER — SODIUM CHLORIDE 0.9% FLUSH: 3.0000 mL | Freq: Once | INTRAVENOUS | Status: DC

## 2019-06-13 NOTE — ED Notes (Signed)

## 2019-06-13 NOTE — ED Triage Notes (Signed)
Pt presents to ED via POV with c/o L sided abdominal swelling and SOB that started after she started new prescription. Pt states started taking Sodium Bicarb for kidney failure on Saturday.

## 2019-06-13 NOTE — Discharge Instructions (Signed)
As we discussed please discontinue use of your hydrochlorothiazide.  Instead please begin taking Lasix/furosemide 40 mg once daily in the morning.  Nephrology will contact you to arrange a repeat appointment likely on Monday for recheck of your labs.  Return to the emergency department for any symptoms personally concerning to yourself.

## 2019-06-13 NOTE — ED Provider Notes (Signed)
[facility] Emergency Department Provider Note  Time seen: 3:41 PM  I have reviewed the triage vital signs and the nursing notes.   HISTORY  Chief Complaint Shortness of Breath and Abdominal Distention   HPI Rebecca Bridges is a 43 y.o. female with a past medical history of cardiomegaly, CHF, CKD, HIV, hypertension, presents to the emergency department for generalized fatigue and mild shortness of breath.  According to the patient over the past week or 2 she has been experiencing increased weakness fatigue and noticed swelling in her abdomen.  Patient states she takes hydrochlorothiazide as her only diuretic at this time.  Patient recently saw her nephrologist and was placed on sodium bicarbonate tablets 4 days ago.  Patient states mild shortness of breath but denies any significant worsening.  States she feels she has increased fluid retention in her abdomen.  Past Medical History:  Diagnosis Date  . 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 L AT thromboembolecomty and PTA.  . Cardiomyopathy ([place])    a. a. 07/2017 Echo: EF 45-50%, Gr1 DD; b. 01/2019 Echo: EF 20-25%, mod dil LV. Impaired relaxation. Nl RV fxn. Mild to mod MR. Ao root 3.5cm.  . CHF (congestive heart failure) ([place])   . CKD (chronic kidney disease), stage III   . HFrEF (heart failure with reduced ejection fraction) ([place])    a. 07/2017 Echo: EF 45-50%, Gr1 DD; b. 01/2019 Echo: EF 20-25%.  Marland Kitchen HIV (human immunodeficiency virus infection) ([place])    a.  Diagnosed in 2015  . Hypertension   . LBBB (left bundle branch block)   . Pancytopenia ([place])   . Tobacco abuse     Patient Active Problem List   Diagnosis Date Noted  . Dilated cardiomyopathy ([place]) 02/15/2019  . 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 Cardiomyopathy ([place])   . CKD (chronic kidney disease), stage III   . LBBB (left bundle branch block)   . Ischemic leg 01/26/2019  . 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 Left leg pain 01/24/2019  . Acute on chronic renal failure ([place]) 01/24/2019  . HTN (hypertension), malignant 08/07/2017  . Nonintractable headache 03/17/2017  . Atypical squamous cell changes of undetermined significance (ASCUS) on cervical cytology with positive high risk human papilloma virus (HPV) 03/03/2016  . Proteinuria 07/12/2015  . Acute kidney injury ([place]) 03/14/2015  . Non compliance with medical treatment 03/14/2015  . Prolonged Q-T interval on ECG 03/14/2015  . Thrombocytopenia ([place]) 03/14/2015  . 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  . Tobacco abuse 11/28/2013  . Human immunodeficiency virus (HIV) disease ([place]) 11/28/2013  . Hypokalemia 11/27/2013  . Hyponatremia 11/27/2013  . Lymphadenopathy 11/27/2013  . Pneumonia 11/27/2013  . Splenomegaly 11/27/2013  . CHF (congestive heart failure) ([place]) 11/26/2013  . Severe hypertension 11/26/2013  . Acute on chronic systolic CHF (congestive heart failure) ([place]) 11/26/2013    Past Surgical History:  Procedure Laterality Date  . CHOLECYSTECTOMY    . LOWER EXTREMITY ANGIOGRAPHY Left 01/25/2019   Procedure: Lower Extremity Angiography;  Surgeon: Katha Cabal, MD;  Location: [facility] [facility];  Service: Cardiovascular;  Laterality: Left;  . RIGHT/LEFT HEART CATH AND CORONARY ANGIOGRAPHY Bilateral 02/15/2019   Procedure: RIGHT/LEFT HEART CATH AND CORONARY ANGIOGRAPHY;  Surgeon: Minna Merritts, MD;  Location: [place];  Service: Cardiovascular;  Laterality: Bilateral;    Prior to Admission medications   Medication Sig Start Date End Date Taking? Authorizing Provider  apixaban (ELIQUIS) 5 MG TABS tablet Take 1 tablet (5 mg total) by mouth 2 (two) times daily. 01/26/19   Demetrios Loll, MD  carvedilol (COREG) 25 MG tablet Take 1 tablet (25 mg total) by mouth 2 (two) times daily with a meal. 02/28/19   Dunn, Areta Haber, PA-C  clopidogrel (PLAVIX) 75 MG tablet Take 1 tablet (75 mg total) by mouth daily with  breakfast. 04/05/19   Minna Merritts, MD  hydrALAZINE (APRESOLINE) 25 MG tablet Take 1 tablet (25 mg total) by mouth every 8 (eight) hours. 04/05/19   Minna Merritts, MD  hydrochlorothiazide (HYDRODIURIL) 12.5 MG tablet Take 1 tablet (12.5 mg total) by mouth daily. 04/23/19   Cuthriell, Charline Bills, PA-C    Allergies  Allergen Reactions  . Sulfa Antibiotics Rash    Family History  Problem Relation Age of Onset  . Hypertension Mother   . Cerebral aneurysm Mother     Social History Social History   Tobacco Use  . Smoking status: Current Every Day Smoker    Types: Cigarettes  . Smokeless tobacco: Never Used  . Tobacco comment: 5 cigarettes a day  Substance Use Topics  . Alcohol use: No  . Drug use: No    Review of Systems Constitutional: Negative for fever. Cardiovascular: Negative for chest pain. Respiratory: Mild shortness of breath with exertion. Gastrointestinal: Negative for abdominal pain, vomiting and diarrhea.  Feels like she has fluid retention in her abdomen. Genitourinary: Negative for urinary compaints Musculoskeletal: Negative for musculoskeletal complaints Neurological: Negative for headache All other ROS negative  ____________________________________________   PHYSICAL EXAM:  VITAL SIGNS: ED Triage Vitals  Enc Vitals Group     BP 06/13/19 1142 (!) 138/100     Pulse Rate 06/13/19 1142 78     Resp 06/13/19 1142 18     Temp 06/13/19 1142 98 F (36.7 C)     Temp Source 06/13/19 1142 Oral     SpO2 06/13/19 1142 99 %     Weight 06/13/19 1142 125 lb (56.7 kg)     Height 06/13/19 1142 5\' 3"  (1.6 m)     Head Circumference --      Peak Flow --      Pain Score 06/13/19 1148 0     Pain Loc --      Pain Edu? --      Excl. in [place]? --    Constitutional: Alert and oriented. Well appearing and in no distress. Eyes: Normal exam ENT      Head: Normocephalic and atraumatic.      Mouth/Throat: Mucous membranes are moist. Cardiovascular: Normal rate,  regular rhythm. No murmur Respiratory: Normal respiratory effort without tachypnea nor retractions. Breath sounds are clear Gastrointestinal: Soft and nontender. No distention.   Musculoskeletal: Nontender with normal range of motion in all extremities.  Neurologic:  Normal speech and language. No gross focal neurologic deficits  Skin:  Skin is warm, dry and intact.  Psychiatric: Mood and affect are normal.  ____________________________________________    EKG  EKG viewed and interpreted by myself shows a normal sinus rhythm at 77 bpm with a widened QRS, normal axis patient has prolonged QTC at 527 ms, nonspecific ST changes but no ST elevation.  Morphology of EKG as well as QTC largely unchanged from prior 04/23/2019.  ____________________________________________    RADIOLOGY  Chest x-ray negative.  ____________________________________________   INITIAL IMPRESSION / ASSESSMENT AND PLAN / ED COURSE  Pertinent labs & imaging results that were available during my  care of the patient were reviewed by me and considered in my medical decision making (see chart for details).   Patient presents to the emergency department with complaints of generalized fatigue with increased fluid accumulation of her abdomen as well as some mild shortness of breath.  Overall the patient appears well, no distress, eating [employer].  Patient's work-up is largely at her baseline besides worsening renal function from 2.6 creatinine to now 3.8.  Remainder the work-up although somewhat abnormal is largely baseline for the patient.  However given the patient's worsening renal function I spoke to Dr. Candiss Norse her nephrologist who reviewed the patient's lab work and recent lab work recommended discontinuing the patient's hydrochlorothiazide and place the patient on 40 mg of Lasix daily.  He states he will follow-up with the patient in the next several days for recheck of her lab work.  He is requesting we obtain a renal  ultrasound in the emergency department as well as a troponin which I have added onto the patient's lab work.   Patient's troponin slightly elevated but due to renal insufficiency largely expected.  Repeat troponin is unchanged.  Patient's renal ultrasound has resulted showing no significant disease.  Per my discussion with Dr. Candiss Norse we will discharge patient home on 40 mg of Lasix daily discontinue hydrochlorothiazide and he will follow her up in the office in several days.  Patient agreeable to plan of care.  Jonylah Kirchner was evaluated in Emergency Department on 06/13/2019 for the symptoms described in the history of present illness. She was evaluated in the context of the global COVID-19 pandemic, which necessitated consideration that the patient might be at risk for infection with the SARS-CoV-2 virus that causes COVID-19. Institutional protocols and algorithms that pertain to the evaluation of patients at risk for COVID-19 are in a state of rapid change based on information released by regulatory bodies including the CDC and federal and state organizations. These policies and algorithms were followed during the patient's care in the ED.  ____________________________________________   FINAL CLINICAL IMPRESSION(S) / ED DIAGNOSES  Weakness Dyspnea Acute on chronic renal insufficiency   Harvest Dark, MD 06/13/19 1820

## 2019-06-15 ENCOUNTER — Telehealth: Payer: Self-pay | Admitting: Family

## 2019-06-15 NOTE — Telephone Encounter (Signed)
LVM for her to call back and r/s her missed appt from 12/28.     Alyse Low, [place]

## 2019-08-22 ENCOUNTER — Telehealth: Payer: Self-pay | Admitting: Pharmacy Technician

## 2019-08-22 NOTE — Telephone Encounter (Signed)
Patient provided 30 day supply of medication.  Patient failed to provide requested proof of income.  [facility] unable to provide additional medication assistance until patient provides requested financial documentation.  [facility]

## 2019-08-28 ENCOUNTER — Encounter (INDEPENDENT_AMBULATORY_CARE_PROVIDER_SITE_OTHER): Payer: Self-pay

## 2019-08-28 ENCOUNTER — Ambulatory Visit (INDEPENDENT_AMBULATORY_CARE_PROVIDER_SITE_OTHER): Payer: Self-pay | Admitting: Vascular Surgery

## 2019-09-21 ENCOUNTER — Encounter (INDEPENDENT_AMBULATORY_CARE_PROVIDER_SITE_OTHER): Payer: Self-pay | Admitting: Vascular Surgery

## 2019-11-22 IMAGING — CR DG CHEST 2V
2 series · 2 of 2 positions shown · non-contrast
Comparison: Chest radiograph 01/25/2019

CLINICAL DATA: History of lower extremity blood clot. Shortness of
breath.

EXAM:
CHEST - 2 VIEW

[chest pa]
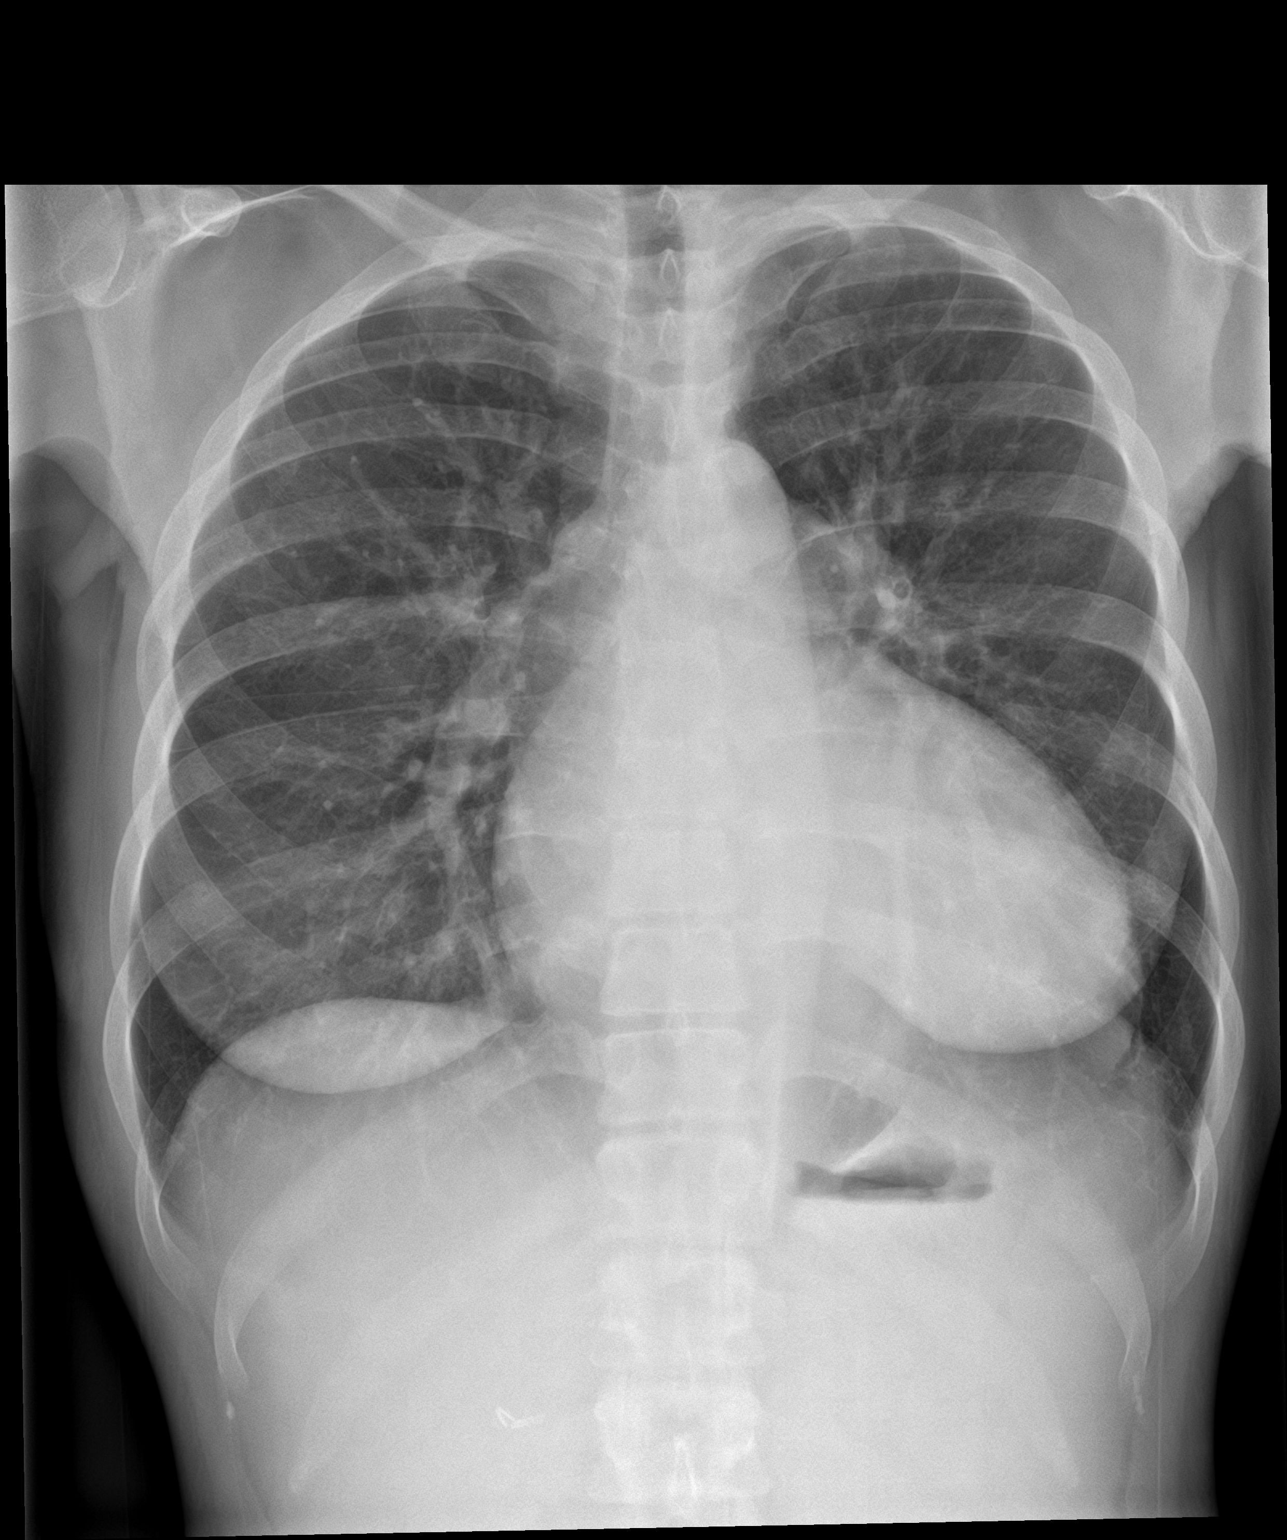

[chest lat]
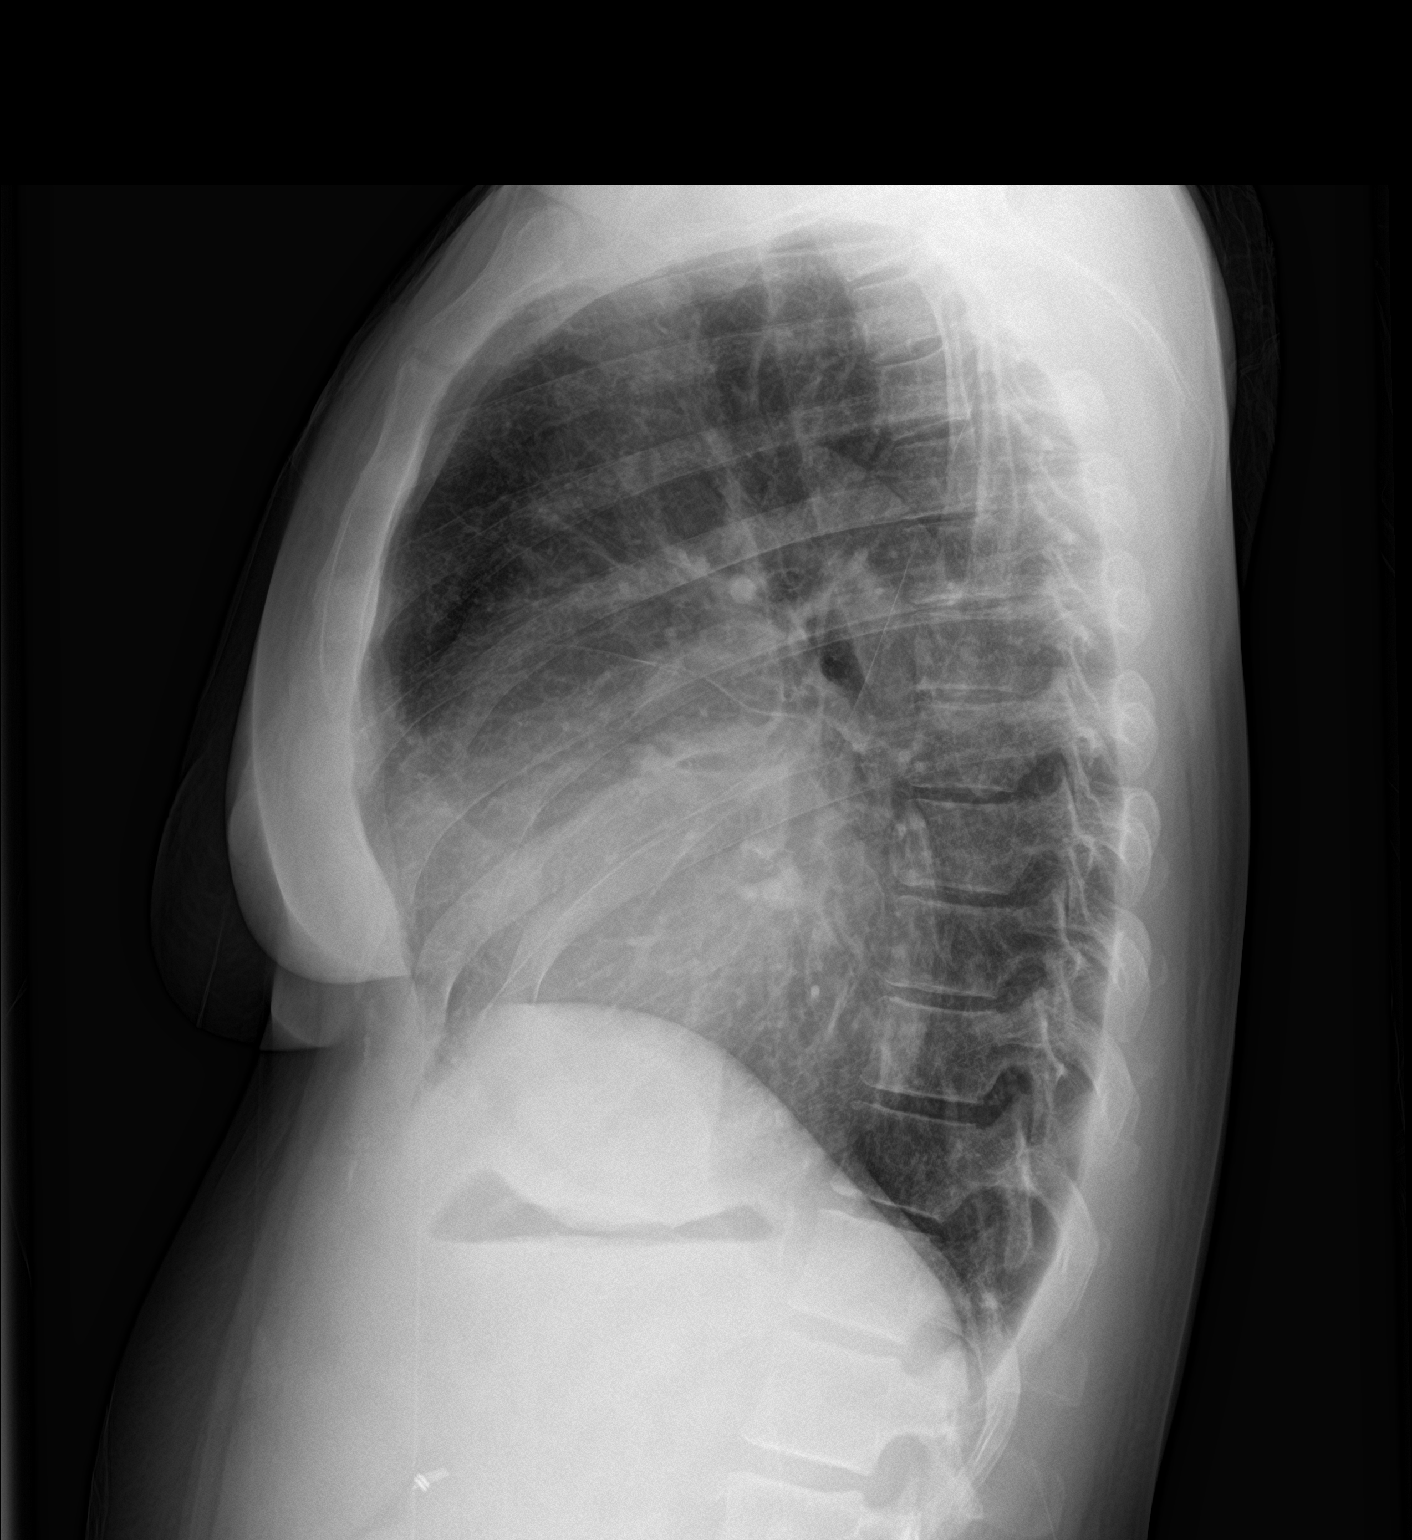

[2 of 2 positions shown; findings below may reference images not displayed]

FINDINGS: Stable enlarged cardiac and mediastinal contours. No consolidative
pulmonary opacities. No pleural effusion or pneumothorax. Thoracic
spine degenerative changes. Cholecystectomy clips.
IMPRESSION: Cardiomegaly.

No acute cardiopulmonary process.

## 2020-01-12 IMAGING — CR DG CHEST 2V
2 series · 2 of 2 positions shown · non-contrast
Comparison: PA and lateral chest 04/23/2019. CT chest, abdomen and
pelvis 01/24/2019.

CLINICAL DATA: Shortness of breath since 06/11/2019.

EXAM:
CHEST - 2 VIEW

[chest pa]
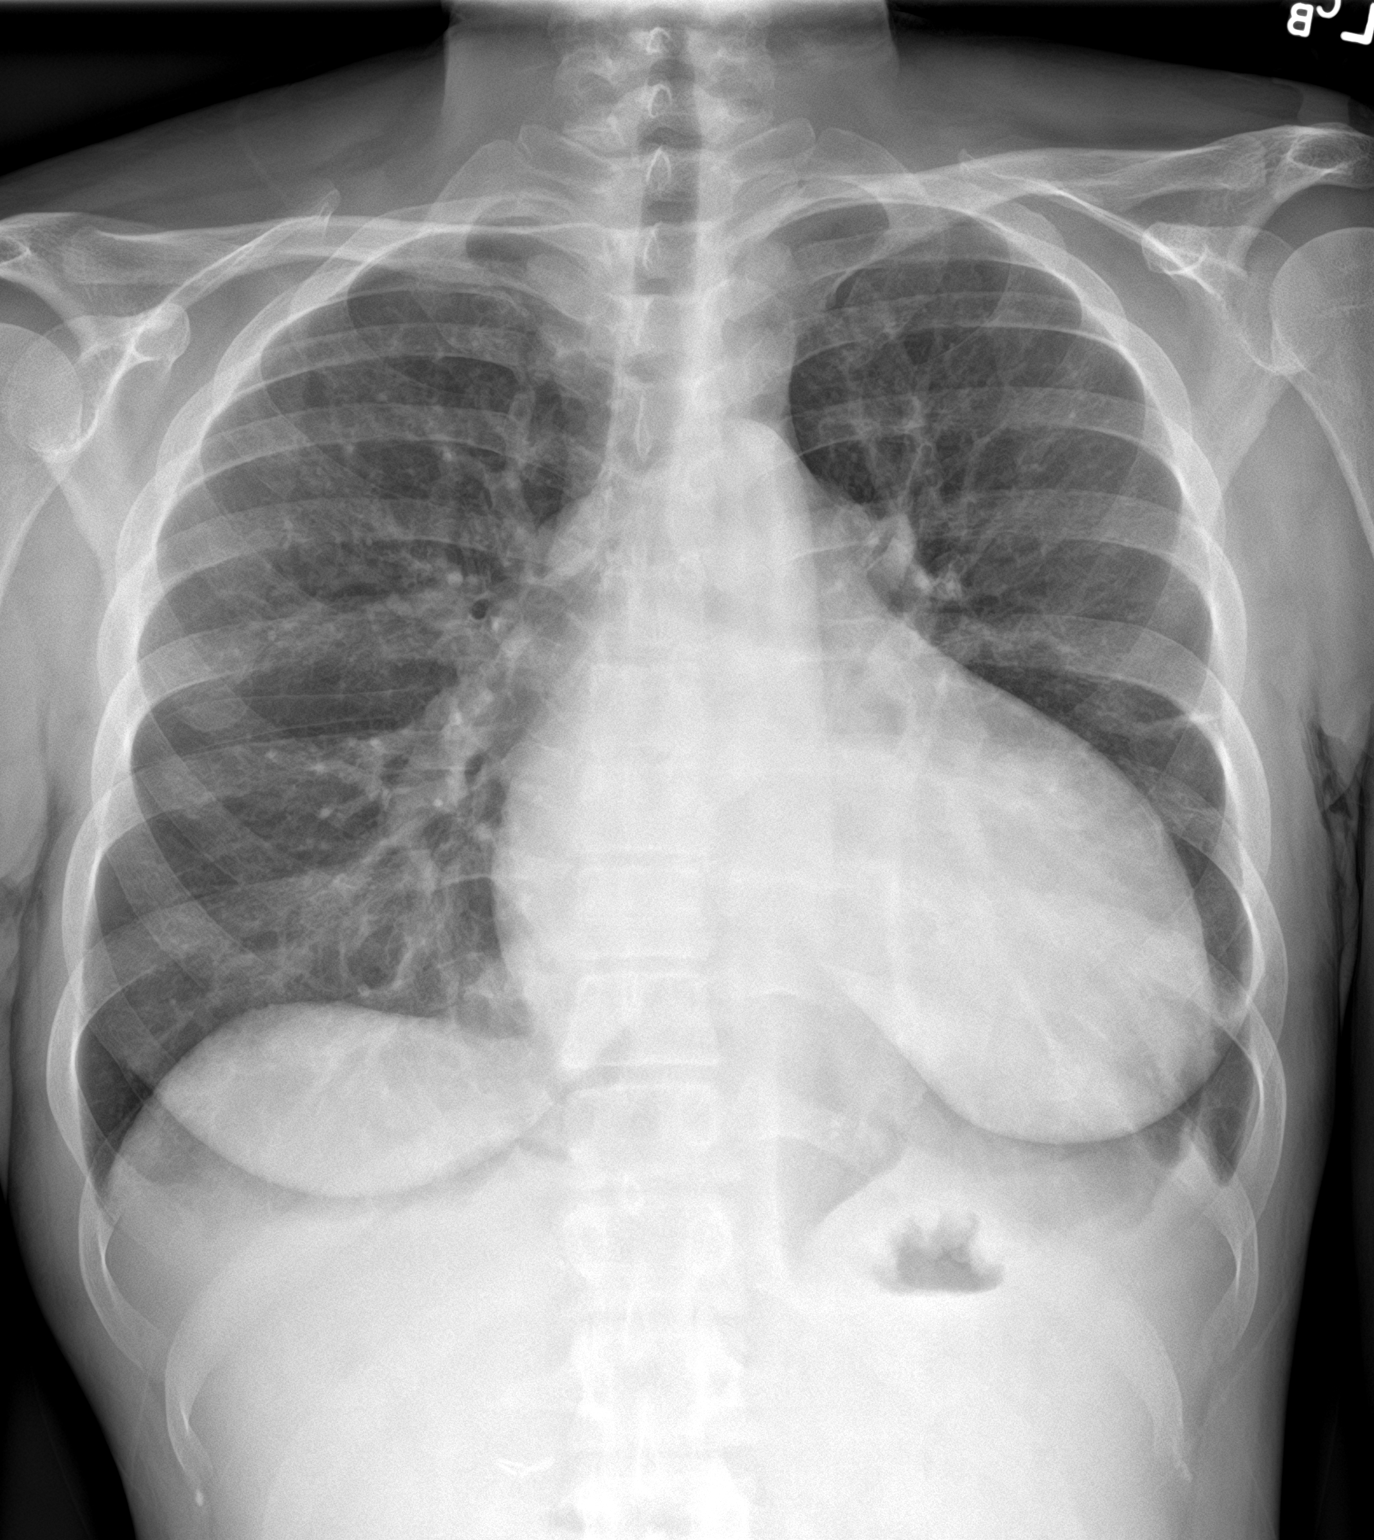

[chest lat]
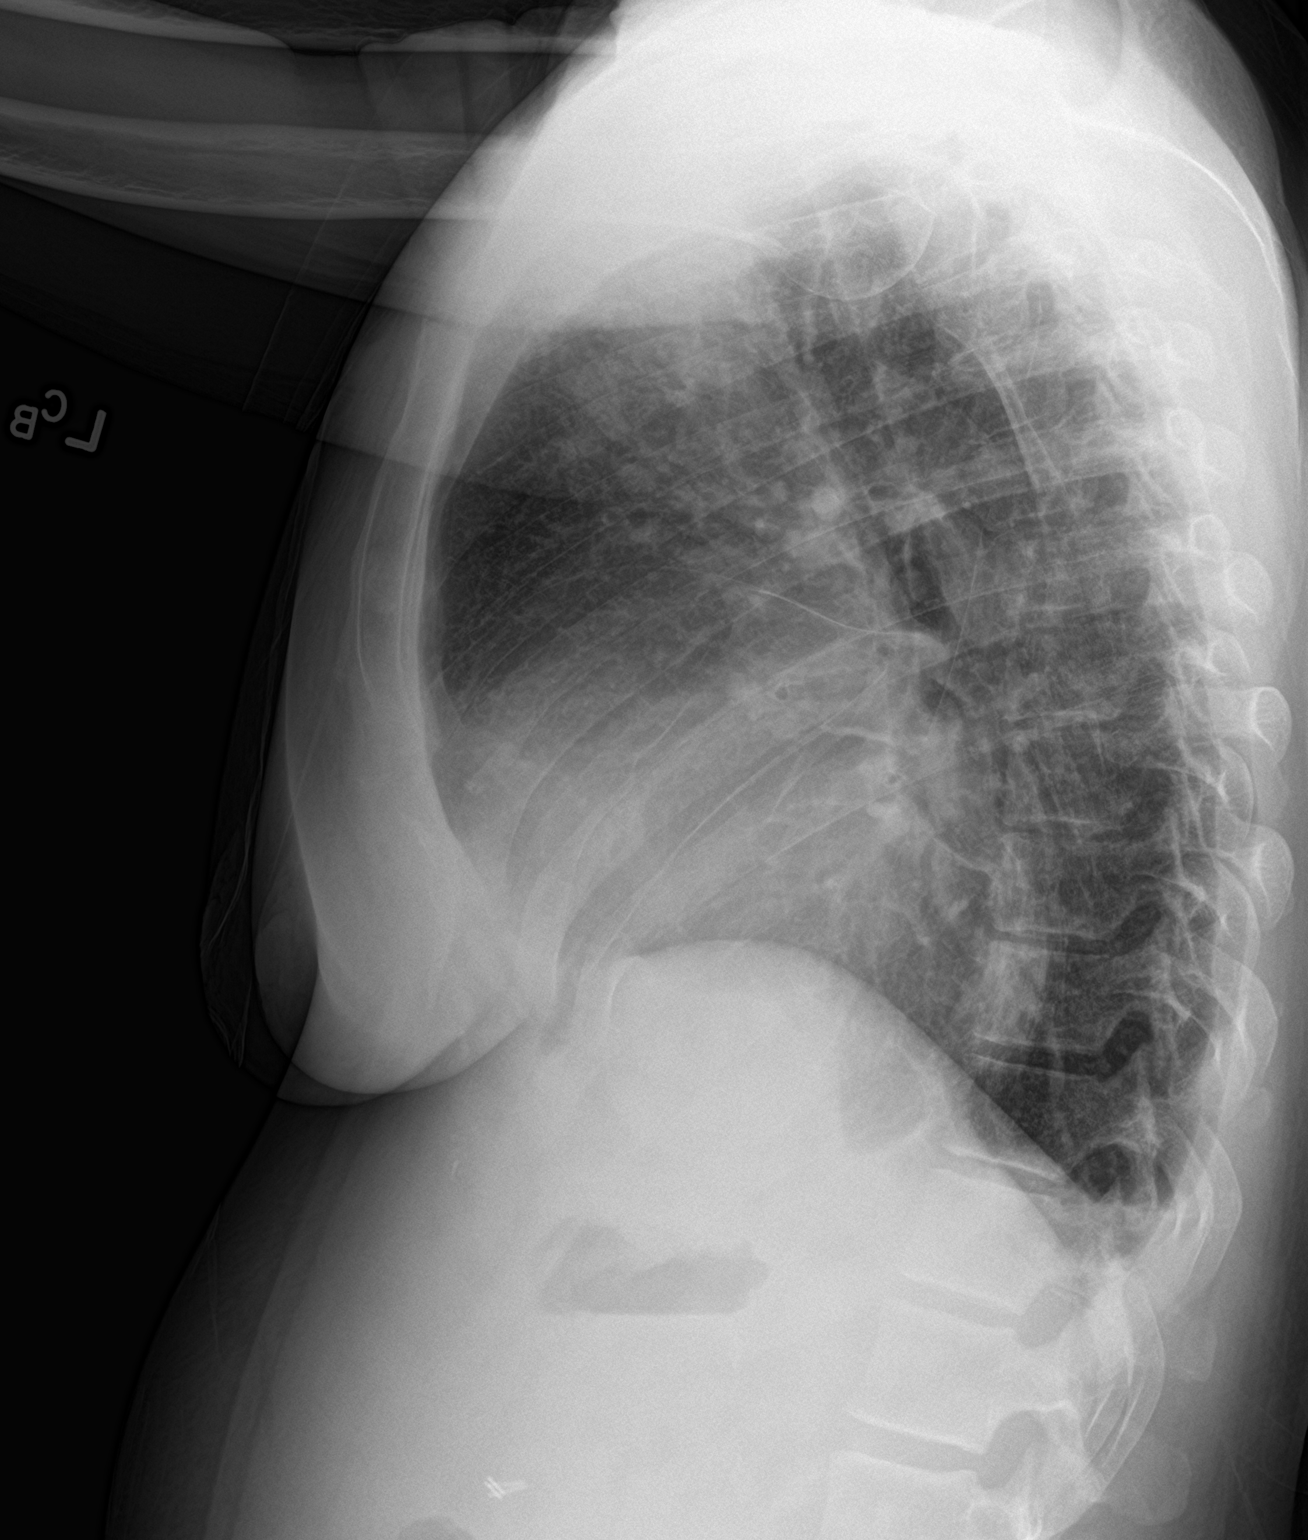

[2 of 2 positions shown; findings below may reference images not displayed]

FINDINGS: The lungs are clear. There is cardiomegaly. No pneumothorax or
pleural effusion. No acute or focal bony abnormality.
IMPRESSION: Cardiomegaly without acute disease.

## 2021-02-24 DIAGNOSIS — Z992 Dependence on renal dialysis: Secondary | ICD-10-CM | POA: Insufficient documentation

## 2021-02-24 DIAGNOSIS — N186 End stage renal disease: Secondary | ICD-10-CM | POA: Insufficient documentation

## 2021-07-25 ENCOUNTER — Emergency Department
Admission: EM | Admit: 2021-07-25 | Discharge: 2021-07-25 | Disposition: A | Discharge status: Home / Self Care | Payer: Medicaid Other | Attending: Emergency Medicine | Admitting: Emergency Medicine

## 2021-07-25 ENCOUNTER — Emergency Department: Payer: Medicaid Other

## 2021-07-25 ENCOUNTER — Other Ambulatory Visit: Payer: Self-pay

## 2021-07-25 DIAGNOSIS — Z992 Dependence on renal dialysis: Secondary | ICD-10-CM | POA: Diagnosis not present

## 2021-07-25 DIAGNOSIS — N186 End stage renal disease: Secondary | ICD-10-CM | POA: Insufficient documentation

## 2021-07-25 DIAGNOSIS — J810 Acute pulmonary edema: Secondary | ICD-10-CM | POA: Insufficient documentation

## 2021-07-25 DIAGNOSIS — R0602 Shortness of breath: Secondary | ICD-10-CM | POA: Diagnosis present

## 2021-07-25 LAB — CBC
HCT: 36.1 % (ref 36.0–46.0)
Hemoglobin: 10.9 g/dL — ABNORMAL LOW (ref 12.0–15.0)
MCH: 35 pg — ABNORMAL HIGH (ref 26.0–34.0)
MCHC: 30.2 g/dL (ref 30.0–36.0)
MCV: 116.1 fL — ABNORMAL HIGH (ref 80.0–100.0)
Platelets: 89 10*3/uL — ABNORMAL LOW (ref 150–400)
RBC: 3.11 MIL/uL — ABNORMAL LOW (ref 3.87–5.11)
RDW: 17.3 % — ABNORMAL HIGH (ref 11.5–15.5)
WBC: 2.8 10*3/uL — ABNORMAL LOW (ref 4.0–10.5)
nRBC: 0 % (ref 0.0–0.2)

## 2021-07-25 LAB — BASIC METABOLIC PANEL
Anion gap: 12 (ref 5–15)
BUN: 38 mg/dL — ABNORMAL HIGH (ref 6–20)
CO2: 26 mmol/L (ref 22–32)
Calcium: 9.6 mg/dL (ref 8.9–10.3)
Chloride: 99 mmol/L (ref 98–111)
Creatinine, Ser: 6.55 mg/dL — ABNORMAL HIGH (ref 0.44–1.00)
GFR, Estimated: 7 mL/min — ABNORMAL LOW (ref >60)
Glucose, Bld: 91 mg/dL (ref 70–99)
Potassium: 4.4 mmol/L (ref 3.5–5.1)
Sodium: 137 mmol/L (ref 135–145)

## 2021-07-25 MED ORDER — CHLORHEXIDINE GLUCONATE CLOTH 2 % EX PADS
6.0000 | MEDICATED_PAD | Freq: Every day | CUTANEOUS | Status: DC
  Filled 2021-07-25: qty 6

## 2021-07-25 MED ORDER — HEPARIN SODIUM (PORCINE) 1000 UNIT/ML IJ SOLN
INTRAMUSCULAR | Status: AC
  Filled 2021-07-25: qty 10

## 2021-07-25 MED ORDER — ALBUTEROL SULFATE HFA 108 (90 BASE) MCG/ACT IN AERS
2.0000 | INHALATION_SPRAY | RESPIRATORY_TRACT | Status: DC | PRN
  Filled 2021-07-25: qty 6.7

## 2021-07-25 NOTE — ED Triage Notes (Signed)
Pt to ED via POV from home. Pt reports SOB x1 day and fluid in abdomen. Pt is dialysis pt and missed appointment yesterday. Pt gets dialysis T/Th/Sat. Last tx was Tuesday and took 2.5L off.

## 2021-07-25 NOTE — ED Notes (Signed)
Report given to [facility], RN dialysis, pt. Signed consent, and transported to dialysis.

## 2021-07-25 NOTE — ED Notes (Signed)
ED Provider at bedside. 

## 2021-07-25 NOTE — ED Provider Notes (Signed)
[facility] Provider Note    Event Date/Time   First MD Initiated Contact with Patient 07/25/21 1307     (approximate)   History   Shortness of Breath   HPI  Rebecca Bridges is a 46 y.o. female with history of end-stage renal disease who presents with complaints of shortness of breath particularly with ambulation or movement.  She feels better when she is lying down.  Patient reports she typically has dialysis Tuesday Thursday Saturday.  She missed dialysis yesterday (Thursday) because she is taking care of her daughter-in-law.  She does not live in the area     Physical Exam   Triage Vital Signs: ED Triage Vitals  Enc Vitals Group     BP 07/25/21 1205 (!) 137/102     Pulse Rate 07/25/21 1205 73     Resp 07/25/21 1205 20     Temp 07/25/21 1227 98.1 F (36.7 C)     Temp Source 07/25/21 1205 Oral     SpO2 07/25/21 1205 97 %     Weight 07/25/21 1224 52.2 kg (115 lb)     Height 07/25/21 1224 1.6 m (5\' 3" )     Head Circumference --      Peak Flow --      Pain Score 07/25/21 1223 0     Pain Loc --      Pain Edu? --      Excl. in [place]? --     Most recent vital signs: Vitals:   07/25/21 1227 07/25/21 1313  BP:  (!) 129/97  Pulse:    Resp:  18  Temp: 98.1 F (36.7 C) 97.9 F (36.6 C)  SpO2:  99%     General: Awake, no distress.  CV:  Good peripheral perfusion.  Regular rate and rhythm Resp:  Normal effort.  Bibasilar rales Abd:  No distention.  No tenderness to palpation Other:  Mild lower extremity edema   ED Results / Procedures / Treatments   Labs (all labs ordered are listed, but only abnormal results are displayed) Labs Reviewed  CBC - Abnormal; Notable for the following components:      Result Value   WBC 2.8 (*)    RBC 3.11 (*)    Hemoglobin 10.9 (*)    MCV 116.1 (*)    MCH 35.0 (*)    RDW 17.3 (*)    Platelets 89 (*)    All other components within normal limits  BASIC METABOLIC PANEL - Abnormal; Notable for the following  components:   BUN 38 (*)    Creatinine, Ser 6.55 (*)    GFR, Estimated 7 (*)    All other components within normal limits     EKG  ED ECG REPORT I, Lavonia Drafts, the attending physician, personally viewed and interpreted this ECG.  Date: 07/25/2021  Rhythm: normal sinus rhythm QRS Axis: normal Intervals: Nonspecific IVCD ST/T Wave abnormalities: normal Narrative Interpretation: no evidence of acute ischemia    RADIOLOGY Chest x-ray viewed by me, consistent with pulmonary edema    PROCEDURES:  Critical Care performed:   Procedures   MEDICATIONS ORDERED IN ED: Medications  albuterol (VENTOLIN HFA) 108 (90 Base) MCG/ACT inhaler 2 puff (has no administration in time range)     IMPRESSION / MDM / ASSESSMENT AND PLAN / ED COURSE  I reviewed the triage vital signs and the nursing notes.   Patient with a history of end-stage renal disease who missed dialysis because she was taking care  of her family presents with shortness of breath, feels fluid overloaded.  Chest x-ray consistent with pulmonary edema.   Lab work demonstrates normal potassium lab work CBC demonstrates white blood cell count of 2.8, hemoglobin of 10.9  Discussed with Dr. Juleen China of nephrology, he will attempt to arrange for dialysis today  ----------------------------------------- 1:49 PM on 07/25/2021 ----------------------------------------- Patient will go for dialysis shortly, appropriate for discharge after dialysis        FINAL CLINICAL IMPRESSION(S) / ED DIAGNOSES   Final diagnoses:  Acute pulmonary edema ([place])  ESRD (end stage renal disease) ([place])     Rx / DC Orders   ED Discharge Orders     None        Note:  This document was prepared using Dragon voice recognition software and may include unintentional dictation errors.   Lavonia Drafts, MD 07/25/21 1349

## 2021-07-25 NOTE — ED Notes (Addendum)
This RN to bedside to assess pt returned from dialysis. Pt not in ED rm. 1. Transport states pt. Was impatient, and stated "this better not take very long, I have a ride waiting for me in the parking lot. I don't need my paperwork, I already know everything and have my dialysis tomorrow".

## 2021-07-25 NOTE — Progress Notes (Signed)
[facility]  ROUNDING NOTE   Subjective:   Rebecca Bridges is a 46 year old female with past medical history including hypertension, congestive heart failure, HLD, pancytopenia, and end-stage renal disease on dialysis.  Patient presents to the emergency department with complaints of shortness of breath after missed dialysis treatment yesterday.  Patient states she is in the area helping her son and daughter-in-law who are expecting their first child.  Patient reports she will be relocating to the area and will receive treatments at [facility] starting on 07/31/2021.  Patient currently resides in [place] and receives treatments on TTS schedule and [facility] and was unable to get back for treatment yesterday.  Patient states she awoke this morning with shortness of breath and is able to feel the fluid in her legs when she is up moving around.  Patient is seen resting quietly on stretcher, remains on room air with sats mid 90s.  Denies pain and discomfort.  Unable to lay flat, feels like she is suffocating.  Denies nausea, vomiting, and diarrhea.  Denies chest pain or cough.  Labs are stable at this time and chest x-ray shows mild pulmonary edema   Objective:  Vital signs in last 24 hours:  Temp:  [97.4 F (36.3 C)-98.1 F (36.7 C)] 97.4 F (36.3 C) (02/10 1447) Pulse Rate:  [69-86] 69 (02/10 1530) Resp:  [11-20] 19 (02/10 1530) BP: (120-137)/(83-102) 122/97 (02/10 1530) SpO2:  [97 %-100 %] 97 % (02/10 1530) Weight:  [52.2 kg] 52.2 kg (02/10 1224)  Weight change:  Filed Weights   07/25/21 1224  Weight: 52.2 kg    Intake/Output: No intake/output data recorded.   Intake/Output this shift:  No intake/output data recorded.  Physical Exam: General: NAD, resting quietly  Head: Normocephalic, atraumatic. Moist oral mucosal membranes  Eyes: Anicteric  Lungs:  Basilar crackles, normal effort  Heart: Regular rate and rhythm  Abdomen:  Soft, nontender, distention   Extremities: Trace peripheral edema.  Neurologic: Alert, moving all four extremities  Skin: No lesions  Access: Right IJ PermCath    Basic Metabolic Panel: Recent Labs  Lab 07/25/21 1217  NA 137  K 4.4  CL 99  CO2 26  GLUCOSE 91  BUN 38*  CREATININE 6.55*  CALCIUM 9.6    Liver Function Tests: No results for input(s): AST, ALT, ALKPHOS, BILITOT, PROT, ALBUMIN in the last 168 hours. No results for input(s): LIPASE, AMYLASE in the last 168 hours. No results for input(s): AMMONIA in the last 168 hours.  CBC: Recent Labs  Lab 07/25/21 1217  WBC 2.8*  HGB 10.9*  HCT 36.1  MCV 116.1*  PLT 89*    Cardiac Enzymes: No results for input(s): CKTOTAL, CKMB, CKMBINDEX, TROPONINI in the last 168 hours.  BNP: Invalid input(s): POCBNP  CBG: No results for input(s): GLUCAP in the last 168 hours.  Microbiology: Results for orders placed or performed during the hospital encounter of 02/10/19  SARS CORONAVIRUS 2 (TAT 6-12 HRS) Nasal Swab Aptima Multi Swab     Status: None   Collection Time: 02/10/19  1:17 PM   Specimen: Aptima Multi Swab; Nasal Swab  Result Value Ref Range Status   SARS Coronavirus 2 NEGATIVE NEGATIVE Final    Comment: (NOTE) SARS-CoV-2 target nucleic acids are NOT DETECTED. The SARS-CoV-2 RNA is generally detectable in upper and lower respiratory specimens during the acute phase of infection. Negative results do not preclude SARS-CoV-2 infection, do not rule out co-infections with other pathogens, and should not be used as  the sole basis for treatment or other patient management decisions. Negative results must be combined with clinical observations, patient history, and epidemiological information. The expected result is Negative. Fact Sheet for Patients: SugarRoll.be Fact Sheet for Healthcare Providers: https://www.woods-mathews.com/ This test is not yet approved or cleared by the [place] FDA and  has  been authorized for detection and/or diagnosis of SARS-CoV-2 by FDA under an Emergency Use Authorization (EUA). This EUA will remain  in effect (meaning this test can be used) for the duration of the COVID-19 declaration under Section 56 4(b)(1) of the Act, 21 U.S.C. section 360bbb-3(b)(1), unless the authorization is terminated or revoked sooner. Performed at [facility], [place] [address] [place], [place] [address]     Coagulation Studies: No results for input(s): LABPROT, INR in the last 72 hours.  Urinalysis: No results for input(s): COLORURINE, LABSPEC, PHURINE, GLUCOSEU, HGBUR, BILIRUBINUR, KETONESUR, PROTEINUR, UROBILINOGEN, NITRITE, LEUKOCYTESUR in the last 72 hours.  Invalid input(s): APPERANCEUR    Imaging: DG Chest 2 View  Result Date: 07/25/2021 CLINICAL DATA:  Shortness of breath. EXAM: CHEST - 2 VIEW COMPARISON:  Chest radiograph dated June 13, 2019 FINDINGS: The heart is enlarged. Pulmonary vascular congestion with bilateral hazy perihilar opacities concerning for mild pulmonary edema. Left basilar opacity which may represent effusion or infiltrate. Double-lumen dialysis catheter is noted. IMPRESSION: 1. Cardiomegaly with pulmonary vascular congestion/mild pulmonary edema. 2. Left lower lobe hazy opacity concerning for pleural effusion, underlying airspace disease can not be excluded. Electronically Signed   By: Keane Police D.O.   On: 07/25/2021 13:08     Medications:     [START ON 07/26/2021] Chlorhexidine Gluconate Cloth  6 each Topical Q0600   albuterol  Assessment/ Plan:  Ms. Rebecca Bridges is a 46 y.o.  female with past medical history including hypertension, congestive heart failure, HLD, pancytopenia, and end-stage renal disease on dialysis.  Patient presents to the emergency department with complaints of shortness of breath after missed dialysis treatment yesterday.   [facility] nephrology [facility]/TTS/right IJ PermCath/49 kg  Respiratory distress  with end-stage renal disease on dialysis related to missed dialysis treatment.  Mild pulmonary edema seen on chest x-ray.  Will provide short dialysis treatment today, but patient states she will be able to get to regular scheduled dialysis treatment tomorrow.  Dialysis coordinator aware of patient and have confirmed transfer in progress to [facility].  Anemia of chronic kidney disease Lab Results  Component Value Date   HGB 10.9 (L) 07/25/2021   Hemoglobin remains within desired target  Secondary Hyperparathyroidism: Lab Results  Component Value Date   CALCIUM 9.6 07/25/2021   Calcium remains within range    LOS: 0 Kennadie Brenner 2/10/20233:34 PM

## 2021-09-09 ENCOUNTER — Emergency Department: Payer: Medicaid Other

## 2021-09-09 ENCOUNTER — Other Ambulatory Visit: Payer: Self-pay

## 2021-09-09 ENCOUNTER — Encounter: Payer: Self-pay | Admitting: Emergency Medicine

## 2021-09-09 ENCOUNTER — Observation Stay
Admission: EM | Admit: 2021-09-09 | Discharge: 2021-09-09 | Disposition: A | Discharge status: Home / Self Care | Payer: Medicaid Other | Attending: Internal Medicine | Admitting: Internal Medicine

## 2021-09-09 DIAGNOSIS — I132 Hypertensive heart and chronic kidney disease with heart failure and with stage 5 chronic kidney disease, or end stage renal disease: Secondary | ICD-10-CM | POA: Diagnosis not present

## 2021-09-09 DIAGNOSIS — N186 End stage renal disease: Secondary | ICD-10-CM | POA: Diagnosis not present

## 2021-09-09 DIAGNOSIS — Z21 Asymptomatic human immunodeficiency virus [HIV] infection status: Secondary | ICD-10-CM | POA: Diagnosis not present

## 2021-09-09 DIAGNOSIS — Z7901 Long term (current) use of anticoagulants: Secondary | ICD-10-CM | POA: Insufficient documentation

## 2021-09-09 DIAGNOSIS — E8770 Fluid overload, unspecified: Secondary | ICD-10-CM | POA: Diagnosis not present

## 2021-09-09 DIAGNOSIS — I10 Essential (primary) hypertension: Secondary | ICD-10-CM | POA: Diagnosis present

## 2021-09-09 DIAGNOSIS — Z20822 Contact with and (suspected) exposure to covid-19: Secondary | ICD-10-CM | POA: Insufficient documentation

## 2021-09-09 DIAGNOSIS — Z79899 Other long term (current) drug therapy: Secondary | ICD-10-CM | POA: Insufficient documentation

## 2021-09-09 DIAGNOSIS — R778 Other specified abnormalities of plasma proteins: Secondary | ICD-10-CM | POA: Diagnosis not present

## 2021-09-09 DIAGNOSIS — J90 Pleural effusion, not elsewhere classified: Secondary | ICD-10-CM | POA: Diagnosis present

## 2021-09-09 DIAGNOSIS — I5022 Chronic systolic (congestive) heart failure: Secondary | ICD-10-CM | POA: Diagnosis not present

## 2021-09-09 DIAGNOSIS — Z992 Dependence on renal dialysis: Secondary | ICD-10-CM | POA: Insufficient documentation

## 2021-09-09 DIAGNOSIS — I743 Embolism and thrombosis of arteries of the lower extremities: Secondary | ICD-10-CM | POA: Diagnosis not present

## 2021-09-09 DIAGNOSIS — R0602 Shortness of breath: Secondary | ICD-10-CM

## 2021-09-09 DIAGNOSIS — F17210 Nicotine dependence, cigarettes, uncomplicated: Secondary | ICD-10-CM | POA: Insufficient documentation

## 2021-09-09 DIAGNOSIS — R051 Acute cough: Secondary | ICD-10-CM

## 2021-09-09 DIAGNOSIS — Z720 Tobacco use: Secondary | ICD-10-CM | POA: Diagnosis present

## 2021-09-09 DIAGNOSIS — R109 Unspecified abdominal pain: Secondary | ICD-10-CM | POA: Diagnosis present

## 2021-09-09 DIAGNOSIS — B20 Human immunodeficiency virus [HIV] disease: Secondary | ICD-10-CM

## 2021-09-09 DIAGNOSIS — R911 Solitary pulmonary nodule: Secondary | ICD-10-CM | POA: Diagnosis present

## 2021-09-09 DIAGNOSIS — D61818 Other pancytopenia: Secondary | ICD-10-CM | POA: Diagnosis not present

## 2021-09-09 DIAGNOSIS — R1011 Right upper quadrant pain: Secondary | ICD-10-CM | POA: Diagnosis not present

## 2021-09-09 LAB — RESP PANEL BY RT-PCR (FLU A&B, COVID) ARPGX2
Influenza A by PCR: NEGATIVE
Influenza B by PCR: NEGATIVE
SARS Coronavirus 2 by RT PCR: NEGATIVE

## 2021-09-09 LAB — HEPATITIS B SURFACE ANTIGEN: Hepatitis B Surface Ag: NONREACTIVE

## 2021-09-09 LAB — HEPATIC FUNCTION PANEL
ALT: 16 U/L (ref 0–44)
AST: 39 U/L (ref 15–41)
Albumin: 3.4 g/dL — ABNORMAL LOW (ref 3.5–5.0)
Alkaline Phosphatase: 58 U/L (ref 38–126)
Bilirubin, Direct: 0.5 mg/dL — ABNORMAL HIGH (ref 0.0–0.2)
Indirect Bilirubin: 0.9 mg/dL (ref 0.3–0.9)
Total Bilirubin: 1.4 mg/dL — ABNORMAL HIGH (ref 0.3–1.2)
Total Protein: 9 g/dL — ABNORMAL HIGH (ref 6.5–8.1)

## 2021-09-09 LAB — BASIC METABOLIC PANEL
Anion gap: 12 (ref 5–15)
BUN: 43 mg/dL — ABNORMAL HIGH (ref 6–20)
CO2: 23 mmol/L (ref 22–32)
Calcium: 8 mg/dL — ABNORMAL LOW (ref 8.9–10.3)
Chloride: 97 mmol/L — ABNORMAL LOW (ref 98–111)
Creatinine, Ser: 7.63 mg/dL — ABNORMAL HIGH (ref 0.44–1.00)
GFR, Estimated: 6 mL/min — ABNORMAL LOW (ref >60)
Glucose, Bld: 107 mg/dL — ABNORMAL HIGH (ref 70–99)
Potassium: 4.7 mmol/L (ref 3.5–5.1)
Sodium: 132 mmol/L — ABNORMAL LOW (ref 135–145)

## 2021-09-09 LAB — CBC WITH DIFFERENTIAL/PLATELET
Abs Immature Granulocytes: 0.02 10*3/uL (ref 0.00–0.07)
Abs Immature Granulocytes: 0.01 10*3/uL (ref 0.00–0.07)
Basophils Absolute: 0 10*3/uL (ref 0.0–0.1)
Basophils Absolute: 0 10*3/uL (ref 0.0–0.1)
Basophils Relative: 1 %
Basophils Relative: 1 %
Eosinophils Absolute: 0.1 10*3/uL (ref 0.0–0.5)
Eosinophils Absolute: 0.1 10*3/uL (ref 0.0–0.5)
Eosinophils Relative: 2 %
Eosinophils Relative: 2 %
HCT: 39.5 % (ref 36.0–46.0)
HCT: 37.3 % (ref 36.0–46.0)
Hemoglobin: 12 g/dL (ref 12.0–15.0)
Hemoglobin: 11.5 g/dL — ABNORMAL LOW (ref 12.0–15.0)
Immature Granulocytes: 1 %
Immature Granulocytes: 0 %
Lymphocytes Relative: 21 %
Lymphocytes Relative: 16 %
Lymphs Abs: 0.7 10*3/uL (ref 0.7–4.0)
Lymphs Abs: 0.5 10*3/uL — ABNORMAL LOW (ref 0.7–4.0)
MCH: 32.3 pg (ref 26.0–34.0)
MCH: 31.9 pg (ref 26.0–34.0)
MCHC: 30.4 g/dL (ref 30.0–36.0)
MCHC: 30.8 g/dL (ref 30.0–36.0)
MCV: 106.2 fL — ABNORMAL HIGH (ref 80.0–100.0)
MCV: 103.3 fL — ABNORMAL HIGH (ref 80.0–100.0)
Monocytes Absolute: 0.2 10*3/uL (ref 0.1–1.0)
Monocytes Absolute: 0.2 10*3/uL (ref 0.1–1.0)
Monocytes Relative: 6 %
Monocytes Relative: 6 %
Neutro Abs: 2.3 10*3/uL (ref 1.7–7.7)
Neutro Abs: 2.2 10*3/uL (ref 1.7–7.7)
Neutrophils Relative %: 69 %
Neutrophils Relative %: 75 %
Platelets: 92 10*3/uL — ABNORMAL LOW (ref 150–400)
Platelets: 111 10*3/uL — ABNORMAL LOW (ref 150–400)
RBC: 3.72 MIL/uL — ABNORMAL LOW (ref 3.87–5.11)
RBC: 3.61 MIL/uL — ABNORMAL LOW (ref 3.87–5.11)
RDW: 16.2 % — ABNORMAL HIGH (ref 11.5–15.5)
RDW: 16 % — ABNORMAL HIGH (ref 11.5–15.5)
WBC: 3.3 10*3/uL — ABNORMAL LOW (ref 4.0–10.5)
WBC: 3 10*3/uL — ABNORMAL LOW (ref 4.0–10.5)
nRBC: 0 % (ref 0.0–0.2)
nRBC: 0 % (ref 0.0–0.2)

## 2021-09-09 LAB — TROPONIN I (HIGH SENSITIVITY)
Troponin I (High Sensitivity): 40 ng/L — ABNORMAL HIGH (ref <18)
Troponin I (High Sensitivity): 36 ng/L — ABNORMAL HIGH (ref <18)

## 2021-09-09 LAB — PHOSPHORUS: Phosphorus: 7.5 mg/dL — ABNORMAL HIGH (ref 2.5–4.6)

## 2021-09-09 LAB — MAGNESIUM: Magnesium: 2.6 mg/dL — ABNORMAL HIGH (ref 1.7–2.4)

## 2021-09-09 LAB — HEPATITIS B SURFACE ANTIBODY,QUALITATIVE: Hep B S Ab: NONREACTIVE

## 2021-09-09 LAB — LIPASE, BLOOD: Lipase: 40 U/L (ref 11–51)

## 2021-09-09 LAB — BRAIN NATRIURETIC PEPTIDE: B Natriuretic Peptide: >4500 pg/mL — ABNORMAL HIGH (ref 0.0–100.0)

## 2021-09-09 MED ORDER — NICOTINE 21 MG/24HR TD PT24
21.0000 mg | MEDICATED_PATCH | Freq: Every day | TRANSDERMAL | Status: DC
  Filled 2021-09-09: qty 1

## 2021-09-09 MED ORDER — NEPRO/CARBSTEADY PO LIQD: 237.0000 mL | ORAL | Status: DC | PRN

## 2021-09-09 MED ORDER — PENTAFLUOROPROP-TETRAFLUOROETH EX AERO: 1.0000 "application " | INHALATION_SPRAY | CUTANEOUS | Status: DC | PRN

## 2021-09-09 MED ORDER — FUROSEMIDE 10 MG/ML IJ SOLN
80.0000 mg | Freq: Once | INTRAMUSCULAR | Status: AC
  Administered 2021-09-09: 80 mg via INTRAVENOUS
  Filled 2021-09-09: qty 8

## 2021-09-09 MED ORDER — ALBUTEROL SULFATE (2.5 MG/3ML) 0.083% IN NEBU: 3.0000 mL | INHALATION_SOLUTION | RESPIRATORY_TRACT | Status: DC | PRN

## 2021-09-09 MED ORDER — HEPARIN SODIUM (PORCINE) 1000 UNIT/ML DIALYSIS
1000.0000 [IU] | INTRAMUSCULAR | Status: DC | PRN
  Filled 2021-09-09 (×2): qty 1

## 2021-09-09 MED ORDER — FUROSEMIDE 40 MG PO TABS: 40.0000 mg | ORAL_TABLET | Freq: Every day | ORAL | Status: DC

## 2021-09-09 MED ORDER — GABAPENTIN 300 MG PO CAPS: 300.0000 mg | ORAL_CAPSULE | Freq: Every day | ORAL | Status: DC

## 2021-09-09 MED ORDER — BICTEGRAVIR-EMTRICITAB-TENOFOV 50-200-25 MG PO TABS
1.0000 | ORAL_TABLET | Freq: Every day | ORAL | Status: DC
  Filled 2021-09-09: qty 1

## 2021-09-09 MED ORDER — DM-GUAIFENESIN ER 30-600 MG PO TB12: 1.0000 | ORAL_TABLET | Freq: Two times a day (BID) | ORAL | Status: DC | PRN

## 2021-09-09 MED ORDER — APIXABAN 5 MG PO TABS
5.0000 mg | ORAL_TABLET | Freq: Two times a day (BID) | ORAL | Status: DC
  Administered 2021-09-09: 5 mg via ORAL
  Filled 2021-09-09: qty 1

## 2021-09-09 MED ORDER — ALTEPLASE 2 MG IJ SOLR
2.0000 mg | Freq: Once | INTRAMUSCULAR | Status: DC | PRN
  Filled 2021-09-09: qty 2

## 2021-09-09 MED ORDER — LIDOCAINE HCL (PF) 1 % IJ SOLN: 5.0000 mL | INTRAMUSCULAR | Status: DC | PRN

## 2021-09-09 MED ORDER — HEPARIN SODIUM (PORCINE) 1000 UNIT/ML IJ SOLN
INTRAMUSCULAR | Status: AC
  Administered 2021-09-09: 3400 [IU] via INTRAVENOUS_CENTRAL
  Filled 2021-09-09: qty 10

## 2021-09-09 MED ORDER — ACETAMINOPHEN 325 MG PO TABS: 650.0000 mg | ORAL_TABLET | Freq: Four times a day (QID) | ORAL | Status: DC | PRN

## 2021-09-09 MED ORDER — HYDRALAZINE HCL 20 MG/ML IJ SOLN: 5.0000 mg | INTRAMUSCULAR | Status: DC | PRN

## 2021-09-09 MED ORDER — DIPHENHYDRAMINE HCL 50 MG/ML IJ SOLN: 12.5000 mg | Freq: Three times a day (TID) | INTRAMUSCULAR | Status: DC | PRN

## 2021-09-09 MED ORDER — CARVEDILOL 25 MG PO TABS: 25.0000 mg | ORAL_TABLET | Freq: Two times a day (BID) | ORAL | Status: DC

## 2021-09-09 MED ORDER — CALCIUM ACETATE (PHOS BINDER) 667 MG PO CAPS
1334.0000 mg | ORAL_CAPSULE | Freq: Three times a day (TID) | ORAL | Status: DC
Start: 2021-09-09 — End: 2021-09-09
  Filled 2021-09-09: qty 2

## 2021-09-09 MED ORDER — HEPARIN SODIUM (PORCINE) 1000 UNIT/ML DIALYSIS
20.0000 [IU]/kg | INTRAMUSCULAR | Status: DC | PRN
  Filled 2021-09-09: qty 1

## 2021-09-09 MED ORDER — OXYCODONE HCL 5 MG PO TABS: 5.0000 mg | ORAL_TABLET | Freq: Four times a day (QID) | ORAL | Status: DC | PRN

## 2021-09-09 MED ORDER — CHLORHEXIDINE GLUCONATE CLOTH 2 % EX PADS
6.0000 | MEDICATED_PAD | Freq: Every day | CUTANEOUS | Status: DC
  Filled 2021-09-09: qty 6

## 2021-09-09 MED ORDER — PANTOPRAZOLE SODIUM 40 MG IV SOLR
40.0000 mg | Freq: Once | INTRAVENOUS | Status: AC
  Administered 2021-09-09: 40 mg via INTRAVENOUS
  Filled 2021-09-09: qty 10

## 2021-09-09 MED ORDER — LIDOCAINE-PRILOCAINE 2.5-2.5 % EX CREA: 1.0000 "application " | TOPICAL_CREAM | CUTANEOUS | Status: DC | PRN

## 2021-09-09 MED ORDER — ADULT MULTIVITAMIN W/MINERALS CH: 1.0000 | ORAL_TABLET | Freq: Every day | ORAL | Status: DC

## 2021-09-09 MED ORDER — SODIUM CHLORIDE 0.9 % IV SOLN: 100.0000 mL | INTRAVENOUS | Status: DC | PRN

## 2021-09-09 MED ORDER — FENTANYL CITRATE PF 50 MCG/ML IJ SOSY
50.0000 ug | PREFILLED_SYRINGE | Freq: Once | INTRAMUSCULAR | Status: AC
  Administered 2021-09-09: 50 ug via INTRAVENOUS
  Filled 2021-09-09: qty 1

## 2021-09-09 MED ORDER — OXYCODONE-ACETAMINOPHEN 5-325 MG PO TABS
1.0000 | ORAL_TABLET | ORAL | Status: DC | PRN
Start: 2021-09-09 — End: 2021-09-09

## 2021-09-09 NOTE — Progress Notes (Signed)
Hemodialysis patient known at [facility] Rebecca Bridges) TTS 6:00am, patient states that she drives herself to treatments. No dialysis concerns stated. ?

## 2021-09-09 NOTE — H&P (Signed)
Abdominal ?History and Physical  ? ? ?Rebecca Bridges WGN:[id number] DOB: 24-Mar-1976 DOA: 09/09/2021 ? ?Referring MD/NP/PA:  ? ?PCP: [name]  ? ?Patient coming from:  The patient is coming from home.  At baseline, pt is independent for most of ADL.       ? ?Chief Complaint: SOB and RUQ abdominal pain ? ?HPI: Rebecca Bridges is a 46 y.o. 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 ? ?Patient states that her shortness breath has been going on for 3 days, which has been progressively worsening, particularly on exertion.  Patient has cough with clear mucus production.  No chest pain, no fever or chills.  Patient reports right upper quadrant pain for 2 days, which is constant, mild to moderate, dull, nonradiating.  Patient has nausea, no vomiting or diarrhea.  No symptoms of UTI.  Patient stated she had dialysis on 3/25.  Patient has HD cath on right upper chest. ? ?Data Reviewed and ED Course: pt was found to BNP> 4500, have pancytopenia, troponin level 40 --> 36, lipase of 40, negative COVID PCR, potassium 4.7, bicarbonate of 23, creatinine 7.63, BUN 43, magnesium 2.6, temperature normal, blood pressure 116/92, heart rate 95, 26, oxygen saturation 100% on room air.  Checks x-ray showed left pleural effusion.  CT of the chest/pelvis/abdomen showed liver cirrhosis, left moderate pleural effusion and multiple lung nodules.  Patient is placed on telemetry bed for patient. ? ?CT-chest/abdomen/pelvis ?Moderate amount of pleural fluid on the left collected in the ?inferior pleural space. Associated atelectasis of the left lower ?lobe. In the aerated lung, there are patchy/focal pulmonary ?densities, largest in the anterior left upper lobe measuring 3-4 cm ?in size. Numerous other density scattered within  the aerated left ?and right lung measure 1.3 cm or smaller. The differential diagnosis ?in this case is that of nodular inflammatory infiltrates versus ?metastatic disease. Non-contrast chest CT at 3-6 months is ?recommended. If the nodules are stable at time of repeat CT, then ?future CT at 18-24 months (from today's scan) is considered optional ?for low-risk patients, but is recommended for high-risk patients. ?This recommendation follows the consensus statement: Guidelines for ?Management of Incidental Pulmonary Nodules Detected on CT Images: ?From the Fleischner Society 2017; Radiology 2017; 284:228-243. ?  ?Cardiomegaly. Aortic atherosclerosis. Coronary artery calcification. ?  ?Cirrhosis of the liver. Small to moderate amount of ascites, freely ?distributed. Nonspecific anasarca. ? ? ?EKG: I have personally reviewed.  Sinus rhythm, left bundle blockade, QTc 557, LAD, poor R wave progression, anteroseptal infarction pattern ? ? ?Review of Systems:  ? ?General: no fevers, chills, no body weight gain, has fatigue ?HEENT: no blurry vision, hearing changes or sore throat ?Respiratory: has dyspnea, coughing, no wheezing ?CV: no chest pain, no palpitations ?GI: has nausea, RUQ abdominal pain, no diarrhea, constipation, vomiting, ?GU: no dysuria, burning on urination, increased urinary frequency, hematuria  ?Ext: has trace leg edema ?Neuro: no unilateral weakness, numbness, or tingling, no vision change or hearing loss ?Skin: no rash, no skin tear. ?MSK: No muscle spasm, no deformity, no limitation of range of movement in spin ?Heme: No easy bruising.  ?Travel history: No recent long distant travel. ? ? ?Allergy:  ?Allergies  ?Allergen Reactions  ? Sulfa Antibiotics Rash  ? ? ?Past Medical History:  ?Diagnosis Date  ? 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 a. L AT thromboembolecomty and PTA.  ? Cardiomyopathy ([place])   ? a. a. 07/2017 Echo: EF 45-50%, Gr1 DD; b. 01/2019 Echo: EF 20-25%, mod dil LV. Impaired  relaxation. Nl RV fxn. Mild to mod MR. Ao root 3.5cm.  ? CHF (congestive heart failure) ([place])   ? CKD (chronic kidney disease), stage III ([place])   ? HFrEF (heart failure with reduced ejection fraction) ([place])   ? a. 07/2017 Echo: EF 45-50%, Gr1 DD; b. 01/2019 Echo: EF 20-25%.  ? HIV (human immunodeficiency virus infection) ([place])   ? a.  Diagnosed in 2015  ? Hypertension   ? LBBB (left bundle branch block)   ? Pancytopenia ([place])   ? Tobacco abuse   ? ? ?Past Surgical History:  ?Procedure Laterality Date  ? CHOLECYSTECTOMY    ? LOWER EXTREMITY ANGIOGRAPHY Left 01/25/2019  ? Procedure: Lower Extremity Angiography;  Surgeon: Katha Cabal, MD;  Location: [facility];  Service: Cardiovascular;  Laterality: Left;  ? RIGHT/LEFT HEART CATH AND CORONARY ANGIOGRAPHY Bilateral 02/15/2019  ? Procedure: RIGHT/LEFT HEART CATH AND CORONARY ANGIOGRAPHY;  Surgeon: Minna Merritts, MD;  Location: [facility];  Service: Cardiovascular;  Laterality: Bilateral;  ? ? ?Social History:  reports that she has been smoking cigarettes. She has never used smokeless tobacco. She reports that she does not drink alcohol and does not use drugs. ? ?Family History:  ?Family History  ?Problem Relation Age of Onset  ? Hypertension Mother   ? Cerebral aneurysm Mother   ?  ? ?Prior to Admission medications   ?Medication Sig Start Date End Date Taking? Authorizing Provider  ?apixaban (ELIQUIS) 5 MG TABS tablet Take 1 tablet (5 mg total) by mouth 2 (two) times daily. 01/26/19  Yes Demetrios Loll, MD  ?Bictegravir-Emtricitab-[facility]) 30-120-15 MG TABS Take by mouth.   Yes [provider]  ?carvedilol (COREG) 25 MG tablet Take 1 tablet (25 mg total) by mouth 2 (two) times daily with a meal. 02/28/19  Yes Dunn, Areta Haber, PA-C  ?furosemide (LASIX) 40 MG tablet Take 1 tablet (40 mg total) by mouth daily. 06/13/19 09/09/21 Yes Paduchowski, Lennette Bihari, MD  ?gabapentin (NEURONTIN) 100 MG capsule Take 300 mg by mouth at bedtime.   Yes  [provider]  ?Multiple Vitamin (MULTIVITAMIN WITH MINERALS) TABS tablet Take 1 tablet by mouth daily.   Yes [provider]  ? ? ?Physical Exam: ?Vitals:  ? 09/09/21 0700 09/09/21 0724 09/09/21 0730 09/09/21 0930  ?BP: (!) 130/97  (!) 116/92 (!) 126/91  ?Pulse: 75  70 68  ?Resp: (!) 26  (!) 21 (!) 21  ?Temp:  97.9 ?F (36.6 ?C)    ?TempSrc:  Oral    ?SpO2: 100%  100% 96%  ?Weight:      ?Height:      ? ?General: Not in acute distress ?HEENT: ?      Eyes: PERRL, EOMI, no scleral icterus. ?      ENT: No discharge from the ears and nose, no pharynx injection, no tonsillar enlargement.  ?      Neck: positive JVD, no bruit, no mass felt. ?Heme: No neck lymph node enlargement. ?Cardiac: S1/S2, RRR, No murmurs, No gallops or rubs. ?Respiratory: has decreased air movement on the left side ?GI: Soft, nondistended, nontender, no rebound pain, no organomegaly, BS present. ?GU: No hematuria ?Ext: has trace  leg edema bilaterally. 1+DP/PT pulse bilaterally. ?Musculoskeletal: No joint deformities, No joint redness or warmth, no  limitation of ROM in spin. ?Skin: No rashes.  ?Neuro: Alert, oriented X3, cranial nerves II-XII grossly intact, moves all extremities normally. ?Psych: Patient is not psychotic, no suicidal or hemocidal ideation. ? ?Labs on Admission: I have personally reviewed following labs and imaging studies ? ?CBC: ?Recent Labs  ?Lab 09/09/21 ?3704  ?WBC 3.3*  ?NEUTROABS 2.3  ?HGB 12.0  ?HCT 39.5  ?MCV 106.2*  ?PLT 92*  ? ?Basic Metabolic Panel: ?Recent Labs  ?Lab 09/09/21 ?0629 09/09/21 ?0809  ?NA 132*  --   ?K 4.7  --   ?CL 97*  --   ?CO2 23  --   ?GLUCOSE 107*  --   ?BUN 43*  --   ?CREATININE 7.63*  --   ?CALCIUM 8.0*  --   ?MG  --  2.6*  ? ?GFR: ?Estimated Creatinine Clearance: 7.8 mL/min (A) (by C-G formula based on SCr of 7.63 mg/dL (H)). ?Liver Function Tests: ?Recent Labs  ?Lab 09/09/21 ?8889  ?AST 39  ?ALT 16  ?ALKPHOS 58  ?BILITOT 1.4*  ?PROT 9.0*  ?ALBUMIN 3.4*  ? ?Recent Labs  ?Lab  09/09/21 ?1694  ?LIPASE 40  ? ?No results for input(s): AMMONIA in the last 168 hours. ?Coagulation Profile: ?No results for input(s): INR, PROTIME in the last 168 hours. ?Cardiac Enzymes: ?No results for input(

## 2021-09-09 NOTE — ED Notes (Signed)
Pt NAD, a/ox4. Pt verbalizes understanding of all DC and f/u instructions. All questions answered. Pt walks with steady gait to lobby at DC.  ? ?

## 2021-09-09 NOTE — ED Notes (Signed)
Pt returns from Dialysis. A/ox4, NAD, eating a pop-tart. Pt states she had come into the ED for SOB and "I had extra fluid on me". Pt compliant with dialysis Txs, denies salty foods. LS clear, VSS ?

## 2021-09-09 NOTE — ED Triage Notes (Addendum)
Patient ambulatory to triage with steady gait, without difficulty, appears uncomfortable; pt reports SHOB, fluid in abdomen and nausea, rt sided abd pain, prod cough clear sputum x 2 days; st hx kidney failure, CHF, dialysis due today but was told to come into ED for eval ?

## 2021-09-09 NOTE — Progress Notes (Signed)
[facility] ?[place], [place] ?09/09/21 ? ?Subjective:  ? ?LOS: 0 ? ?Patient known to our practice from outpatient dialysis as well as previous admissions. ?She presents to the emergency room for shortness of breath that has been going on for 3 days.  Patient reports it has been progressively worsening.  She has a cough that is productive of clear mucus.  No fevers or chills.  No nausea or vomiting although her appetite has been low. ?CT chest abdomen pelvis done on March 28 shows evidence of early cirrhosis of the liver with relative prominence of the left lobe.  Nonspecific subcutaneous edema diffusely present.  Small to moderate amount of ascites was also noted.  She has moderate amount of pleural fluid in the left inferior pleural space.  Associated atelectasis of the left lower lobe.  Patchy pulmonary infiltrates. ?Nephrology consult has been requested to evaluate for dialysis. ?Patient's last outpatient treatment was on March 25.  Treatment time was 183 minutes.  She got treatment by 42 minutes.  Post weight of 49.8 kg.  Blood Florio 400 cc was achieved. ? ?Objective:  ?Vital signs in last 24 hours:  ?Temp:  [97.7 ?F (36.5 ?C)-97.9 ?F (36.6 ?C)] 97.7 ?F (36.5 ?C) (03/28 1040) ?Pulse Rate:  [66-78] 66 (03/28 1315) ?Resp:  [14-30] 30 (03/28 1315) ?BP: (116-143)/(89-109) 142/96 (03/28 1315) ?SpO2:  [94 %-100 %] 97 % (03/28 1315) ?Weight:  [52.2 kg-52.4 kg] 52.4 kg (03/28 1040) ? ?Weight change:  ?Filed Weights  ? 09/09/21 0624 09/09/21 1040  ?Weight: 52.2 kg 52.4 kg  ? ? ?Intake/Output: ?  ?No intake or output data in the 24 hours ending 09/09/21 1339 ? ?Physical Exam: ?General:  No acute distress, laying in the bed  ?HEENT  anicteric, moist oral mucous membrane  ?Pulm/lungs  normal breathing effort, lungs are clear to auscultation  ?CVS/Heart  regular rhythm, no rub or gallop  ?Abdomen:   Soft, nontender  ?Extremities: + some dependent peripheral edema is present  ?Neurologic:  Alert,  oriented, able to follow commands  ?Skin:  No acute rashes  ?IJ PermCath ? ? ? ?Basic Metabolic Panel: ? ?Recent Labs  ?Lab 09/09/21 ?0629 09/09/21 ?0809 09/09/21 ?1050  ?NA 132*  --   --   ?K 4.7  --   --   ?CL 97*  --   --   ?CO2 23  --   --   ?GLUCOSE 107*  --   --   ?BUN 43*  --   --   ?CREATININE 7.63*  --   --   ?CALCIUM 8.0*  --   --   ?MG  --  2.6*  --   ?PHOS  --   --  7.5*  ? ? ? ?CBC: ?Recent Labs  ?Lab 09/09/21 ?9147  ?WBC 3.3*  ?NEUTROABS 2.3  ?HGB 12.0  ?HCT 39.5  ?MCV 106.2*  ?PLT 92*  ? ? ? No results found for: HEPBSAG, HEPBSAB, HEPBIGM ? ? ? ?Microbiology: ? ?Recent Results (from the past 240 hour(s))  ?Resp Panel by RT-PCR (Flu A&B, Covid) Nasopharyngeal Swab     Status: None  ? Collection Time: 09/09/21  6:29 AM  ? Specimen: Nasopharyngeal Swab; Nasopharyngeal(NP) swabs in vial transport medium  ?Result Value Ref Range Status  ? SARS Coronavirus 2 by RT PCR NEGATIVE NEGATIVE Final  ?  Comment: (NOTE) ?SARS-CoV-2 target nucleic acids are NOT DETECTED. ? ?The SARS-CoV-2 RNA is generally detectable in upper respiratory ?specimens during the acute phase of infection. The  lowest ?concentration of SARS-CoV-2 viral copies this assay can detect is ?138 copies/mL. A negative result does not preclude SARS-Cov-2 ?infection and should not be used as the sole basis for treatment or ?other patient management decisions. A negative result may occur with  ?improper specimen collection/handling, submission of specimen other ?than nasopharyngeal swab, presence of viral mutation(s) within the ?areas targeted by this assay, and inadequate number of viral ?copies(<138 copies/mL). A negative result must be combined with ?clinical observations, patient history, and epidemiological ?information. The expected result is Negative. ? ?Fact Sheet for Patients:  ?EntrepreneurPulse.com.au ? ?Fact Sheet for Healthcare Providers:  ?IncredibleEmployment.be ? ?This test is no t yet approved or  cleared by the [place] FDA and  ?has been authorized for detection and/or diagnosis of SARS-CoV-2 by ?FDA under an Emergency Use Authorization (EUA). This EUA will remain  ?in effect (meaning this test can be used) for the duration of the ?COVID-19 declaration under Section 564(b)(1) of the Act, 21 ?U.S.C.section 360bbb-3(b)(1), unless the authorization is terminated  ?or revoked sooner.  ? ? ?  ? Influenza A by PCR NEGATIVE NEGATIVE Final  ? Influenza B by PCR NEGATIVE NEGATIVE Final  ?  Comment: (NOTE) ?The Xpert Xpress SARS-CoV-2/FLU/RSV plus assay is intended as an aid ?in the diagnosis of influenza from Nasopharyngeal swab specimens and ?should not be used as a sole basis for treatment. Nasal washings and ?aspirates are unacceptable for Xpert Xpress SARS-CoV-2/FLU/RSV ?testing. ? ?Fact Sheet for Patients: ?EntrepreneurPulse.com.au ? ?Fact Sheet for Healthcare Providers: ?IncredibleEmployment.be ? ?This test is not yet approved or cleared by the [place] FDA and ?has been authorized for detection and/or diagnosis of SARS-CoV-2 by ?FDA under an Emergency Use Authorization (EUA). This EUA will remain ?in effect (meaning this test can be used) for the duration of the ?COVID-19 declaration under Section 564(b)(1) of the Act, 21 U.S.C. ?section 360bbb-3(b)(1), unless the authorization is terminated or ?revoked. ? ?Performed at [facility], [place], ?[place] [address] ?  ? ? ?Coagulation Studies: ?No results for input(s): LABPROT, INR in the last 72 hours. ? ?Urinalysis: ?No results for input(s): COLORURINE, LABSPEC, [place], GLUCOSEU, HGBUR, BILIRUBINUR, KETONESUR, PROTEINUR, UROBILINOGEN, NITRITE, LEUKOCYTESUR in the last 72 hours. ? ?Invalid input(s): APPERANCEUR  ? ? ?Imaging: ?DG Chest Portable 1 View ? ?Result Date: 09/09/2021 ?CLINICAL DATA:  46 year old female with shortness of breath. Dialysis patient. EXAM: PORTABLE CHEST 1 VIEW  COMPARISON:  Chest radiographs 07/25/2021 and earlier. FINDINGS: Portable AP upright view at 0643 hours. Right chest dual lumen dialysis catheter appears stable. Increasing dense opacification of the left lower chest, with a small to moderate left pleural effusion suspected in February. No air bronchograms. No superimposed pneumothorax or pulmonary edema. Visible mediastinal contours are stable. Right lung appears stable, negative. Visualized tracheal air column is within normal limits. No acute osseous abnormality identified. Negative visible bowel gas. IMPRESSION: Progressive dense opacification of the left lower hemithorax since last month. Favor progressive left pleural effusion. No pulmonary edema. Electronically Signed   By: Genevie Ann M.D.   On: 09/09/2021 06:57  ? ?CT CHEST ABDOMEN PELVIS WO CONTRAST ? ?Result Date: 09/09/2021 ?CLINICAL DATA:  Evaluate left lung opacification and right upper quadrant abdominal pain. Dialysis patient. EXAM: CT CHEST, ABDOMEN AND PELVIS WITHOUT CONTRAST TECHNIQUE: Multidetector CT imaging of the chest, abdomen and pelvis was performed following the standard protocol without IV contrast. RADIATION DOSE REDUCTION: This exam was performed according to the departmental dose-optimization program which includes automated exposure control, adjustment  of the mA and/or kV according to patient size and/or use of iterative reconstruction technique. COMPARISON:  Chest radiography earlier same day.  CT 01/24/2019 FINDINGS: CT CHEST FINDINGS Cardiovascular: Central line is in place from a right jugular approach with the tip in the right atrium. There is cardiomegaly. No pericardial effusion is seen. Coronary artery calcification and aortic atherosclerotic calcification are present. Pulmonary arteries not evaluated because absent contrast. Mediastinum/Nodes: No adenopathy or mediastinal mass. Lungs/Pleura: There is a moderate amount of pleural fluid on the left collected at the inferior pleural  space. There is associated volume loss in the left lower lobe which is nonspecific. In August of 2020, there was an 8 mm nodule in the superior segment of the left lower lobe axial image 36 which today is seen as a smal

## 2021-09-09 NOTE — ED Provider Notes (Signed)
? ?[facility] ?Provider Note ? ? ? Event Date/Time  ? First MD Initiated Contact with Patient 09/09/21 [phone]   ?  (approximate) ? ? ?History  ? ?Shortness of Breath ? ? ?HPI ? ?Rebecca Bridges is a 46 y.o. female with a past medical history of ESRD on HD TThS most recently dialyzed 3/25 as well as a history of CHF, PE on Eliquis, HTN, HIV and tobacco abuse who presents for evaluation of 2 to 3 days of cough productive of some white sputum, shortness of breath, nausea and some right upper quadrant abdominal pain.  No fevers, hemoptysis or chest pain today although patient states he had a little chest tightness yesterday.  Her abdomen does not hurt anywhere other than the right upper quadrant and she has no back pain.  She does not be approximate 2 or 3 times a day and has not noticed any new urinary symptoms.  No rashes or traumatic injury or falls.  She states she is compliant with her medications including her Eliquis.  She denies any EtOH use or illicit drug use. ? ?  ?Past Medical History:  ?Diagnosis Date  ? 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 a. L AT thromboembolecomty and PTA.  ? Cardiomyopathy ([place])   ? a. a. 07/2017 Echo: EF 45-50%, Gr1 DD; b. 01/2019 Echo: EF 20-25%, mod dil LV. Impaired relaxation. Nl RV fxn. Mild to mod MR. Ao root 3.5cm.  ? CHF (congestive heart failure) ([place])   ? CKD (chronic kidney disease), stage III ([place])   ? HFrEF (heart failure with reduced ejection fraction) ([place])   ? a. 07/2017 Echo: EF 45-50%, Gr1 DD; b. 01/2019 Echo: EF 20-25%.  ? HIV (human immunodeficiency virus infection) ([place])   ? a.  Diagnosed in 2015  ? Hypertension   ? LBBB (left bundle branch block)   ? Pancytopenia ([place])   ? Tobacco abuse   ? ? ? ?Physical Exam  ?Triage Vital Signs: ?ED Triage Vitals  ?Enc Vitals Group  ?   BP 09/09/21 0623 (!) 140/105  ?   Pulse Rate 09/09/21 0623 78  ?   Resp 09/09/21 0623 20  ?   Temp --   ?   Temp Source 09/09/21 0623 Oral  ?   SpO2 09/09/21 0623 100 %   ?   Weight 09/09/21 0624 115 lb (52.2 kg)  ?   Height 09/09/21 0624 5\' 3"  (1.6 m)  ?   Head Circumference --   ?   Peak Flow --   ?   Pain Score 09/09/21 0623 8  ?   Pain Loc --   ?   Pain Edu? --   ?   Excl. in [place]? --   ? ? ?Most recent vital signs: ?Vitals:  ? 09/09/21 0724 09/09/21 0730  ?BP:  (!) 116/92  ?Pulse:  70  ?Resp:  (!) 21  ?Temp: 97.9 ?F (36.6 ?C)   ?SpO2:  100%  ? ? ?General: Awake, no distress.  ?CV:  Good peripheral perfusion.  2+ radial pulse. ?Resp:  Normal effort.  Slightly tachypneic.  Diminished in the left base. ?Abd:  No distention.  Mildly tender in epigastrium and right upper quadrant. ?Other:  Is significant lower extremity edema. ? ? ?ED Results / Procedures / Treatments  ?Labs ?(all labs ordered are listed, but only abnormal results are displayed) ?Labs Reviewed  ?CBC WITH DIFFERENTIAL/PLATELET - Abnormal; Notable for the following components:  ?  Result Value  ? WBC 3.3 (*)   ? RBC 3.72 (*)   ? MCV 106.2 (*)   ? RDW 16.2 (*)   ? Platelets 92 (*)   ? All other components within normal limits  ?BASIC METABOLIC PANEL - Abnormal; Notable for the following components:  ? Sodium 132 (*)   ? Chloride 97 (*)   ? Glucose, Bld 107 (*)   ? BUN 43 (*)   ? Creatinine, Ser 7.63 (*)   ? Calcium 8.0 (*)   ? GFR, Estimated 6 (*)   ? All other components within normal limits  ?BRAIN NATRIURETIC PEPTIDE - Abnormal; Notable for the following components:  ? B Natriuretic Peptide >4,500.0 (*)   ? All other components within normal limits  ?HEPATIC FUNCTION PANEL - Abnormal; Notable for the following components:  ? Total Protein 9.0 (*)   ? Albumin 3.4 (*)   ? Total Bilirubin 1.4 (*)   ? Bilirubin, Direct 0.5 (*)   ? All other components within normal limits  ?MAGNESIUM - Abnormal; Notable for the following components:  ? Magnesium 2.6 (*)   ? All other components within normal limits  ?TROPONIN I (HIGH SENSITIVITY) - Abnormal; Notable for the following components:  ? Troponin I (High Sensitivity) 40 (*)    ? All other components within normal limits  ?TROPONIN I (HIGH SENSITIVITY) - Abnormal; Notable for the following components:  ? Troponin I (High Sensitivity) 36 (*)   ? All other components within normal limits  ?RESP PANEL BY RT-PCR (FLU A&B, COVID) ARPGX2  ?LIPASE, BLOOD  ?URINALYSIS, COMPLETE (UACMP) WITH MICROSCOPIC  ? ? ? ?EKG ? ?ECG is remarkable for sinus rhythm with a ventricular rate of 75, incomplete bundle branch block, QTc interval of 557, right axis deviation, and some nonspecific ST changes throughout. ? ? ?RADIOLOGY ? ?Chest x-ray interpreted by myself shows some opacification of the left lung base concerning for effusion versus edema with some peribronchial edema as well.  No clear pneumothorax or other focal consolidation or other acute thoracic process. ? ?CT chest abdomen pelvis my interpretation remarkable for left-sided pleural effusion without evidence of pneumothorax, clear focal consolidation or other acute thoracic process.  There is also evidence of moderate amount of ascites in the abdomen and cardiomegaly.  Also reviewed radiology interpretation and agree with their findings of multiple patchy densities in the lungs possibly representing noninflammatory infiltrates versus metastatic disease as well as cardiomegaly, aortic atherosclerosis, CAD, cirrhosis and small amount of ascites and nonspecific anasarca. ? ? ?PROCEDURES: ? ?Critical Care performed: No ? ?.1-3 Lead EKG Interpretation ?Performed by: Lucrezia Starch, MD ?Authorized by: Lucrezia Starch, MD  ? ?  Interpretation: non-specific   ?  ECG rate assessment: normal   ?  Rhythm: sinus rhythm   ?  Conduction: abnormal   ? ?The patient is on the cardiac monitor to evaluate for evidence of arrhythmia and/or significant heart rate changes. ? ? ?MEDICATIONS ORDERED IN ED: ?Medications  ?fentaNYL (SUBLIMAZE) injection 50 mcg (50 mcg Intravenous Given 09/09/21 0723)  ?furosemide (LASIX) injection 80 mg (80 mg Intravenous Given 09/09/21  0810)  ?pantoprazole (PROTONIX) injection 40 mg (40 mg Intravenous Given 09/09/21 0809)  ? ? ? ?IMPRESSION / MDM / ASSESSMENT AND PLAN / ED COURSE  ?I reviewed the triage vital signs and the nursing notes. ?             ?               ? ?  Differential diagnosis includes, but is not limited to pneumonia, volume overload, ACS, arrhythmia, anemia, metabolic derangements, pancreatitis, hepatitis infectious gastritis.  Patient is status postcholecystectom.  In addition she reports compliant with her Eliquis and have a low suspicion for PE.  She has no chest pain today and overall have a very low suspicion for dissection.  Patient also confirms she is taking her HIV medications. ? ?ECG is remarkable for sinus rhythm with a ventricular rate of 75, incomplete bundle branch block, QTc interval of 557, right axis deviation, and some nonspecific ST changes throughout.  Troponin stable and downtrending at 40 and 36 which I suspect represents a mild demand ischemia in the setting of volume overloaded ESRD with lower suspicion for an occlusion MI at this time. ? ?Chest x-ray interpreted by myself shows some opacification of the left lung base concerning for effusion versus edema with some peribronchial edema as well.  No clear pneumothorax or other focal consolidation or other acute thoracic process. ? ?CT chest abdomen pelvis my interpretation remarkable for left-sided pleural effusion without evidence of pneumothorax, clear focal consolidation or other acute thoracic process.  There is also evidence of moderate amount of ascites in the abdomen and cardiomegaly.  Also reviewed radiology interpretation and agree with their findings of multiple patchy densities in the lungs possibly representing noninflammatory infiltrates versus metastatic disease as well as cardiomegaly, aortic atherosclerosis, CAD, cirrhosis and small amount of ascites and nonspecific anasarca. ? ?COVID and influenza PCR is negative.  CBC shows chronic  leukopenia with WBC count of 3.3, hemoglobin of 12 and chronic normal cytopenia with platelets of 92 close to baseline.  Lipase not suggestive of acute pancreatitis.  Hepatic function panel shows no evidence of

## 2021-09-09 NOTE — Progress Notes (Signed)
Hemodialysis Post Treatment Note: ? ?Tx date:09/09/2021 ?Tx time: 3.5 hours ?Access: Right CVC ?UF Removed: 2L ? ?Note: ? ?10:56 HD started ?14:15 Blood leak noticed on top of dialyzer, approximately 50-60 ml. NP Shantelle made  NP Shantelle made CBC- done ?14:29 HD completed. Pt asymptomatic, No complaints.  ? ? ? ? ? ? ? ? ? ? ?  ?

## 2021-09-09 NOTE — Discharge Summary (Signed)
?Physician Discharge Summary  ?Rebecca Bridges YYQ:[id number] DOB: 12/17/1975 DOA: 09/09/2021 ? ?PCP: [name] ? ?Admit date: 09/09/2021 ?Discharge date: 09/09/2021 ? ?Recommendations for Outpatient Follow-up:  ?Follow up with PCP in 2 weeks ?F/u with renal for HD ? ?Home Health: none ?Equipment/Devices: none ? ?Discharge Condition: stable ?CODE STATUS: full ?Diet recommendation: renal diet ? ?Brief/Interim Summary (HPI) ? ?HPI: Rebecca Bridges is a 46 y.o. 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 ?  ?Patient states that her shortness breath has been going on for 3 days, which has been progressively worsening, particularly on exertion.  Patient has cough with clear mucus production.  No chest pain, no fever or chills.  Patient reports right upper quadrant pain for 2 days, which is constant, mild to moderate, dull, nonradiating.  Patient has nausea, no vomiting or diarrhea.  No symptoms of UTI.  Patient stated she had dialysis on 3/25.  Patient has HD cath on right upper chest. ?  ?Data Reviewed and ED Course: pt was found to BNP> 4500, have pancytopenia, troponin level 40 --> 36, lipase of 40, negative COVID PCR, potassium 4.7, bicarbonate of 23, creatinine 7.63, BUN 43, magnesium 2.6, temperature normal, blood pressure 116/92, heart rate 95, 26, oxygen saturation 100% on room air.  Checks x-ray showed left pleural effusion.  CT of the chest/pelvis/abdomen showed liver cirrhosis, left moderate pleural effusion and multiple lung nodules.  Patient is placed on telemetry bed for patient. ?  ?CT-chest/abdomen/pelvis ?Moderate amount of pleural fluid on the left collected in the ?inferior pleural space. Associated atelectasis of the left lower ?lobe. In the aerated lung, there are patchy/focal  pulmonary ?densities, largest in the anterior left upper lobe measuring 3-4 cm ?in size. Numerous other density scattered within the aerated left ?and right lung measure 1.3 cm or smaller. The differential diagnosis ?in this case is that of nodular inflammatory infiltrates versus ?metastatic disease. Non-contrast chest CT at 3-6 months is ?recommended. If the nodules are stable at time of repeat CT, then ?future CT at 18-24 months (from today's scan) is considered optional ?for low-risk patients, but is recommended for high-risk patients. ?This recommendation follows the consensus statement: Guidelines for ?Management of Incidental Pulmonary Nodules Detected on CT Images: ?From the Fleischner Society 2017; Radiology 2017; 284:228-243. ?  ?Cardiomegaly. Aortic atherosclerosis. Coronary artery calcification. ?  ?Cirrhosis of the liver. Small to moderate amount of ascites, freely ?distributed. Nonspecific anasarca. ?  ?  ?EKG: I have personally reviewed.  Sinus rhythm, left bundle blockade, QTc 557, LAD, poor R wave progression, anteroseptal infarction pattern ? ? ? ?Discharge Diagnoses and Hospital Course:  ? ?Principal Problem: ?  Fluid overload ?Active Problems: ?  HTN (hypertension), malignant ?  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  Tobacco abuse ?  Human immunodeficiency virus (HIV) disease ([place]) ?  Lung nodule ?  Pancytopenia ([place]) ?  Chronic systolic CHF (congestive heart failure) ([place]) ?  Abdominal pain ?  Pleural effusion on left ? ? ? ? ?Fluid overload and ESRD-HS (TTS): Patient's shortness of breath is likely due to fluid overload.  Patient has trace leg edema, but has positive JVD, BNP> 4500, and pleural effusion, consistent with fluid overload.  No oxygen desaturation.  Patient had minimally elevated troponin 40 --> 60, no chest pain, likely due to decreased  clearance secondary to ESRD.  Dr. Candiss Norse for renal is consulted.  Patient had dialysis.  She feels better.  Patient strongly wants to go  home.  Since patient is stable, clinically improved.  Patient is discharged home.  Patient is instructed to follow-up with PCP in 2 weeks.  Follow-up with renal for dialysis ? ? ?HTN (hypertension), malignant ?-Coreg ?-also on Lasix ?  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  ?Tobacco abuse ?-Nicotine patch in the hospital ?  ?Human immunodeficiency virus (HIV) disease ([place]) ?-Continue home [place] ?  ?Lung nodule: CT scan showed multiple lung nodules ?-Need to repeat CT scan in 3-69-month as outpatient ?  ?Pancytopenia ([place]): Stable.  WBC 3.3, hemoglobin 12, platelet 92 (patient had WBC 2.8, hemoglobin 10.9, platelet 89 on 07/25/2021) ?-Follow-up CBC ?  ?Chronic systolic CHF (congestive heart failure) ([place]): 2D echo 01/27/2019 showed EF of 20-25%.  Now patient has a fluid overload ?-Continue home Lasix ?-Volume management per renal by dialysis ?  ?Abdominal pain: Patient has a right upper quadrant abdominal pain.  Lipase normal.  Liver function normal except for mild elevated bilirubin 1.4.  CT scan is negative for acute intracranial abnormalities, but showed liver cirrhosis.  Likely due to congestion. ?-Supportive care ?-As needed oxycodone in hospital ?  ?Pleural effusion on left: ?-Patient had dialysis.  If patient does not respond to dialysis or getting worse, then consider thoracentesis ?  ? ? ? ?Allergies as of 09/09/2021   ? ?   Reactions  ? Sulfa Antibiotics Rash  ? ?  ? ?  ?Medication List  ?  ? ?TAKE these medications   ? ?apixaban 5 MG Tabs tablet ?Commonly known as: ELIQUIS ?Take 1 tablet (5 mg total) by mouth 2 (two) times daily. ?  ?bictegravir-emtricitabine-tenofovir AF 50-200-25 MG Tabs tablet ?Commonly known as: BIKTARVY ?Take by mouth. ?  ?carvedilol 25 MG tablet ?Commonly known as: COREG ?Take 1 tablet (25 mg total) by mouth 2 (two) times daily with a meal. ?  ?furosemide 40 MG tablet ?Commonly known as: Lasix ?Take 1 tablet (40 mg total) by mouth daily. ?  ?gabapentin  100 MG capsule ?Commonly known as: NEURONTIN ?Take 300 mg by mouth at bedtime. ?  ?multivitamin with minerals Tabs tablet ?Take 1 tablet by mouth daily. ?  ? ?  ? ? Follow-up Information   ? ? [place], [facility] Follow up in 2 week(s).   ?Contact information: ?[place] [address] ?[phone] ? ? ?  ?  ? ? Minna Merritts, MD .   ?Specialty: Cardiology ?Contact information: ?[address] ?[place] [address] ?[phone] ? ? ?  ?  ? ?  ?  ? ?  ? ?Allergies  ?Allergen Reactions  ? Sulfa Antibiotics Rash  ? ? ?Consultations: ?renal ? ? ?Procedures/Studies: ?DG Chest Portable 1 View ? ?Result Date: 09/09/2021 ?CLINICAL DATA:  46 year old female with shortness of breath. Dialysis patient. EXAM: PORTABLE CHEST 1 VIEW COMPARISON:  Chest radiographs 07/25/2021 and earlier. FINDINGS: Portable AP upright view at 0643 hours. Right chest dual lumen dialysis catheter appears stable. Increasing dense opacification of the left lower chest, with a small to moderate left pleural effusion suspected in February. No air bronchograms. No superimposed pneumothorax or pulmonary edema. Visible mediastinal contours are stable. Right lung appears stable, negative. Visualized tracheal air column is within normal limits. No acute osseous abnormality identified. Negative visible bowel gas. IMPRESSION: Progressive dense opacification of the  left lower hemithorax since last month. Favor progressive left pleural effusion. No pulmonary edema. Electronically Signed   By: Genevie Ann M.D.   On: 09/09/2021 06:57  ? ?CT CHEST ABDOMEN PELVIS WO CONTRAST ? ?Result Date: 09/09/2021 ?CLINICAL DATA:  Evaluate left lung opacification and right upper quadrant abdominal pain. Dialysis patient. EXAM: CT CHEST, ABDOMEN AND PELVIS WITHOUT CONTRAST TECHNIQUE: Multidetector CT imaging of the chest, abdomen and pelvis was performed following the standard protocol without IV contrast. RADIATION DOSE REDUCTION: This exam was  performed according to the departmental dose-optimization program which includes automated exposure control, adjustment of the mA and/or kV according to patient size and/or use of iterative reconstruction techn

## 2021-09-09 NOTE — Discharge Instructions (Addendum)
You were cared for by a hospitalist during your hospital stay. If you have any questions about your discharge medications or the care you received while you were in the hospital after you are discharged, you can call the unit and ask to speak with the hospitalist on call if the hospitalist that took care of you is not available. Once you are discharged, your primary care physician will handle any further medical issues. Please note that NO REFILLS for any discharge medications will be authorized once you are discharged, as it is imperative that you return to your primary care physician (or establish a relationship with a primary care physician if you do not have one) for your aftercare needs so that they can reassess your need for medications and monitor your lab values. ? ?Follow up with PCP and kidney doctor for dialysis. Take all medications as prescribed. If symptoms change or worsen please return to the ED for evaluation. Your CT scan showed multiple lung nodules. You need repeat CT scan in 3-82-month as outpatient by your PCP ? ?

## 2021-09-10 LAB — HEPATITIS B SURFACE ANTIBODY, QUANTITATIVE: Hep B S AB Quant (Post): <3.1 m[IU]/mL — ABNORMAL LOW (ref >9.9)

## 2021-09-24 ENCOUNTER — Emergency Department: Payer: Medicaid Other

## 2021-09-24 ENCOUNTER — Encounter: Payer: Self-pay | Admitting: Emergency Medicine

## 2021-09-24 ENCOUNTER — Other Ambulatory Visit: Payer: Self-pay

## 2021-09-24 ENCOUNTER — Emergency Department
Admission: EM | Admit: 2021-09-24 | Discharge: 2021-09-24 | Disposition: A | Discharge status: Home / Self Care | Payer: Medicaid Other | Attending: Emergency Medicine | Admitting: Emergency Medicine

## 2021-09-24 DIAGNOSIS — Z86718 Personal history of other venous thrombosis and embolism: Secondary | ICD-10-CM | POA: Diagnosis not present

## 2021-09-24 DIAGNOSIS — E861 Hypovolemia: Secondary | ICD-10-CM | POA: Diagnosis not present

## 2021-09-24 DIAGNOSIS — Z7901 Long term (current) use of anticoagulants: Secondary | ICD-10-CM | POA: Insufficient documentation

## 2021-09-24 DIAGNOSIS — I5043 Acute on chronic combined systolic (congestive) and diastolic (congestive) heart failure: Secondary | ICD-10-CM

## 2021-09-24 DIAGNOSIS — Z992 Dependence on renal dialysis: Secondary | ICD-10-CM | POA: Diagnosis not present

## 2021-09-24 DIAGNOSIS — R1011 Right upper quadrant pain: Secondary | ICD-10-CM | POA: Diagnosis not present

## 2021-09-24 DIAGNOSIS — Z21 Asymptomatic human immunodeficiency virus [HIV] infection status: Secondary | ICD-10-CM | POA: Insufficient documentation

## 2021-09-24 DIAGNOSIS — R0602 Shortness of breath: Secondary | ICD-10-CM

## 2021-09-24 DIAGNOSIS — F17200 Nicotine dependence, unspecified, uncomplicated: Secondary | ICD-10-CM | POA: Diagnosis not present

## 2021-09-24 DIAGNOSIS — I10 Essential (primary) hypertension: Secondary | ICD-10-CM | POA: Diagnosis present

## 2021-09-24 DIAGNOSIS — Z20822 Contact with and (suspected) exposure to covid-19: Secondary | ICD-10-CM | POA: Diagnosis not present

## 2021-09-24 DIAGNOSIS — E8770 Fluid overload, unspecified: Secondary | ICD-10-CM | POA: Diagnosis not present

## 2021-09-24 DIAGNOSIS — I120 Hypertensive chronic kidney disease with stage 5 chronic kidney disease or end stage renal disease: Secondary | ICD-10-CM | POA: Diagnosis not present

## 2021-09-24 DIAGNOSIS — R1012 Left upper quadrant pain: Secondary | ICD-10-CM | POA: Diagnosis not present

## 2021-09-24 DIAGNOSIS — N186 End stage renal disease: Secondary | ICD-10-CM | POA: Diagnosis present

## 2021-09-24 DIAGNOSIS — I509 Heart failure, unspecified: Secondary | ICD-10-CM

## 2021-09-24 LAB — CBC
HCT: 39.4 % (ref 36.0–46.0)
Hemoglobin: 11.6 g/dL — ABNORMAL LOW (ref 12.0–15.0)
MCH: 32.2 pg (ref 26.0–34.0)
MCHC: 29.4 g/dL — ABNORMAL LOW (ref 30.0–36.0)
MCV: 109.4 fL — ABNORMAL HIGH (ref 80.0–100.0)
Platelets: 74 10*3/uL — ABNORMAL LOW (ref 150–400)
RBC: 3.6 MIL/uL — ABNORMAL LOW (ref 3.87–5.11)
RDW: 17.3 % — ABNORMAL HIGH (ref 11.5–15.5)
WBC: 2.6 10*3/uL — ABNORMAL LOW (ref 4.0–10.5)
nRBC: 1.2 % — ABNORMAL HIGH (ref 0.0–0.2)

## 2021-09-24 LAB — COMPREHENSIVE METABOLIC PANEL
ALT: 16 U/L (ref 0–44)
AST: 34 U/L (ref 15–41)
Albumin: 3.3 g/dL — ABNORMAL LOW (ref 3.5–5.0)
Alkaline Phosphatase: 68 U/L (ref 38–126)
Anion gap: 11 (ref 5–15)
BUN: 26 mg/dL — ABNORMAL HIGH (ref 6–20)
CO2: 23 mmol/L (ref 22–32)
Calcium: 9.1 mg/dL (ref 8.9–10.3)
Chloride: 102 mmol/L (ref 98–111)
Creatinine, Ser: 4.83 mg/dL — ABNORMAL HIGH (ref 0.44–1.00)
GFR, Estimated: 11 mL/min — ABNORMAL LOW (ref >60)
Glucose, Bld: 95 mg/dL (ref 70–99)
Potassium: 4 mmol/L (ref 3.5–5.1)
Sodium: 136 mmol/L (ref 135–145)
Total Bilirubin: 1.2 mg/dL (ref 0.3–1.2)
Total Protein: 9.2 g/dL — ABNORMAL HIGH (ref 6.5–8.1)

## 2021-09-24 LAB — RESP PANEL BY RT-PCR (FLU A&B, COVID) ARPGX2
Influenza A by PCR: NEGATIVE
Influenza B by PCR: NEGATIVE
SARS Coronavirus 2 by RT PCR: NEGATIVE

## 2021-09-24 LAB — TROPONIN I (HIGH SENSITIVITY)
Troponin I (High Sensitivity): 31 ng/L — ABNORMAL HIGH (ref <18)
Troponin I (High Sensitivity): 31 ng/L — ABNORMAL HIGH (ref <18)

## 2021-09-24 LAB — LIPASE, BLOOD: Lipase: 44 U/L (ref 11–51)

## 2021-09-24 LAB — HCG, QUANTITATIVE, PREGNANCY: hCG, Beta Chain, Quant, S: 2 m[IU]/mL (ref <5)

## 2021-09-24 LAB — BRAIN NATRIURETIC PEPTIDE: B Natriuretic Peptide: >4500 pg/mL — ABNORMAL HIGH (ref 0.0–100.0)

## 2021-09-24 MED ORDER — PANTOPRAZOLE SODIUM 40 MG PO TBEC: 40.0000 mg | DELAYED_RELEASE_TABLET | Freq: Every day | ORAL | 0 refills | Status: DC

## 2021-09-24 MED ORDER — HYDROCODONE-ACETAMINOPHEN 5-325 MG PO TABS: 1.0000 | ORAL_TABLET | Freq: Four times a day (QID) | ORAL | 0 refills | Status: AC | PRN

## 2021-09-24 MED ORDER — PREDNISOLONE ACETATE 1 % OP SUSP: 1.0000 [drp] | Freq: Every day | OPHTHALMIC | Status: DC

## 2021-09-24 MED ORDER — ALUM & MAG HYDROXIDE-SIMETH 200-200-20 MG/5ML PO SUSP
30.0000 mL | Freq: Once | ORAL | Status: AC
Start: 2021-09-24 — End: 2021-09-24
  Administered 2021-09-24: 30 mL via ORAL
  Filled 2021-09-24: qty 30

## 2021-09-24 MED ORDER — DAPSONE 100 MG PO TABS
100.0000 mg | ORAL_TABLET | Freq: Every day | ORAL | Status: DC
  Filled 2021-09-24: qty 1

## 2021-09-24 MED ORDER — CARVEDILOL 6.25 MG PO TABS
12.5000 mg | ORAL_TABLET | Freq: Two times a day (BID) | ORAL | Status: DC
  Filled 2021-09-24: qty 2

## 2021-09-24 MED ORDER — CALCIUM ACETATE (PHOS BINDER) 667 MG PO CAPS: 2001.0000 mg | ORAL_CAPSULE | Freq: Three times a day (TID) | ORAL | Status: DC

## 2021-09-24 MED ORDER — GABAPENTIN 300 MG PO CAPS: 300.0000 mg | ORAL_CAPSULE | Freq: Every day | ORAL | Status: DC

## 2021-09-24 MED ORDER — APIXABAN 5 MG PO TABS
5.0000 mg | ORAL_TABLET | Freq: Two times a day (BID) | ORAL | Status: DC
  Filled 2021-09-24: qty 1

## 2021-09-24 MED ORDER — FUROSEMIDE 10 MG/ML IJ SOLN
80.0000 mg | Freq: Once | INTRAMUSCULAR | Status: AC
  Administered 2021-09-24: 80 mg via INTRAVENOUS
  Filled 2021-09-24: qty 8

## 2021-09-24 MED ORDER — FENTANYL CITRATE PF 50 MCG/ML IJ SOSY
50.0000 ug | PREFILLED_SYRINGE | Freq: Once | INTRAMUSCULAR | Status: AC
  Administered 2021-09-24: 50 ug via INTRAVENOUS
  Filled 2021-09-24: qty 1

## 2021-09-24 MED ORDER — SUCRALFATE 1 G PO TABS
1.0000 g | ORAL_TABLET | Freq: Once | ORAL | Status: AC
Start: 2021-09-24 — End: 2021-09-24
  Administered 2021-09-24: 1 g via ORAL
  Filled 2021-09-24: qty 1

## 2021-09-24 MED ORDER — BICTEGRAVIR-EMTRICITAB-TENOFOV 50-200-25 MG PO TABS
1.0000 | ORAL_TABLET | Freq: Every day | ORAL | Status: DC
  Filled 2021-09-24: qty 1

## 2021-09-24 MED ORDER — PANTOPRAZOLE SODIUM 40 MG IV SOLR
40.0000 mg | Freq: Once | INTRAVENOUS | Status: AC
Start: 2021-09-24 — End: 2021-09-24
  Administered 2021-09-24: 40 mg via INTRAVENOUS
  Filled 2021-09-24: qty 10

## 2021-09-24 NOTE — Subjective & Objective (Signed)
Rebecca Bridges, a 46 y/o with ESRD-HD and severe systolic heart failure has been doing poorly at home. She complains of discomfort in her abdomen which which she attributes to fluid. She does have a h/o chronic fluid overload and progressive systolic heart failure. She follows at [facility] for nephrology and cardiology. She had HD 09/23/21 having had 4.2 Kg taken off but her session was stopped due to Nausea. She presents today to [facility] - ED for abdominal discomfort and Dypnea - which is chronic but slightly worse. She does report DOE at home doing simple tasks.  ?

## 2021-09-24 NOTE — Assessment & Plan Note (Signed)
Per patient her last EF was 15%. Her [facility] cardiologist is talking with her about pacemaker placement (defib/pacemaker?). She is not a candidate for transplant at this time. She reports being adherent to her medical regimen. Chart review reveals chronically elevated BNP >4,000.  She is not having chest pain. Her abdominal pain is RUQ. CT reveals an enlarged liver. Source of pain likely "Nutmeg" liver from combined heart failure. ? ?Plan Stable for discharge to home ? Mild narcotic pain medication for abdominal discomfort ? Continue all cardiac meds ? Keep appointment with Cardiology Monday 09/29/21 ?

## 2021-09-24 NOTE — Assessment & Plan Note (Signed)
Patient c/o abdominal discomfort but not acute exam. Non-acute abdomen on exam. Tenderness at RUQ. Question of palpable liver edge just below right costal margin. Tendern to deep palpation over RUG, tender to percussion. Suspect passive hepatic congestion, "Nutmeg" liver related to heart disease with renal failure ? ?Plan Continue to follow with nephrology and cardiology ? Continue home medical regimen ? Add mild pain control: tramadol vs hydrocodone ?

## 2021-09-24 NOTE — Assessment & Plan Note (Signed)
Tu,TH,Sat HD - has been adherent and usually tolerates well. She does appear to be modestly fluid overloaded today but not in need of emergent HD ? ?Plan Keep regularly scheduled HD 09/25/21 at 0600 hrs ?

## 2021-09-24 NOTE — ED Triage Notes (Signed)
Pt via POV from home. Pt c/o SOB and abd distention since Monday states that it has been increasingly getting worse since Monday. Abdomen is distended. Denies hx of liver issues. Pt is on dialysis T Th S and has not missed any treatments. States that the people are dialysis told her its not the type of fluid that they can draw off at dialysis. Pt is A&OX4 and NAD.  ?

## 2021-09-24 NOTE — Consult Note (Signed)
Initial Consultation Note ? ? ?Patient: Rebecca Bridges HQP:[id number] DOB: March 02, 1976 PCP: [name] ?DOA: 09/24/2021 ?DOS: the patient was seen and examined on 09/24/2021 ?Primary service: No att. providers found ? ?Referring physician: Dr. Tamala Julian - EDP ?Reason for consult: Hypoxemia, abdominal discomfort ? ?Assessment/Plan: ?Assessment and Plan: ?Abdominal pain, RUQ ?Patient c/o abdominal discomfort but not acute exam. Non-acute abdomen on exam. Tenderness at RUQ. Question of palpable liver edge just below right costal margin. Tendern to deep palpation over RUG, tender to percussion. Suspect passive hepatic congestion, "Nutmeg" liver related to heart disease with renal failure ? ?Plan Continue to follow with nephrology and cardiology ? Continue home medical regimen ? Add mild pain control: tramadol vs hydrocodone ? ?Fluid overload ?Patient with chronic dyspnea. She reports getting short of breath going from bedroom to bathroom or attempting home chores. She does not have a pulse oximeter at home. Her DOE is worse when she has abdominal discomfort. CT A/P reealed moderate free fluid similar to prior exam. Left lung base collapse with small effusion is stable.  CXR with improved left pleural effusion. She dialyized 4.2 Kg fluid 09/23/21. At initial ED presentation O2 sat dropped to 85% with step-test. At discharge exam O2 sat to 91% with >1 minute step test. No indication for home O2.  ? ?Plan Continue present meds ? Keep HD appointment at 0600 hrs 09/25/21 ? ?Acute on chronic combined systolic and diastolic CHF (congestive heart failure) ([place]) ?Per patient her last EF was 15%. Her [facility] cardiologist is talking with her about pacemaker placement (defib/pacemaker?). She is not a candidate for transplant at this time. She reports being adherent to her medical regimen. Chart review reveals chronically elevated BNP >4,000.  She is not having chest pain. Her abdominal pain is RUQ. CT reveals an enlarged liver. Source  of pain likely "Nutmeg" liver from combined heart failure. ? ?Plan Stable for discharge to home ? Mild narcotic pain medication for abdominal discomfort ? Continue all cardiac meds ? Keep appointment with Cardiology Monday 09/29/21 ? ?ESRD (end stage renal disease) ([place]) ?Tu,TH,Sat HD - has been adherent and usually tolerates well. She does appear to be modestly fluid overloaded today but not in need of emergent HD ? ?Plan Keep regularly scheduled HD 09/25/21 at 0600 hrs ? ?HTN (hypertension), malignant ?BP is stable. Continue home medications ? ? ? ? ? ? ?TRH will sign off at present, please call us again when needed. ? ?HPI: Rebecca Bridges is a 46 y.o. female with past medical history of Ms. Tennyson, a 46 y/o with ESRD-HD and severe systolic heart failure has been doing poorly at home. She complains of discomfort in her abdomen which which she attributes to fluid. She does have a h/o chronic fluid overload and progressive systolic heart failure. She follows at [facility] for nephrology and cardiology. She had HD 09/23/21 having had 4.2 Kg taken off but her session was stopped due to Nausea. She presents today to [facility] - ED for abdominal discomfort and Dypnea - which is chronic but slightly worse. She does report DOE at home doing simple tasks. . ? ?Review of Systems: As mentioned in the history of present illness. All other systems reviewed and are negative. ?Past Medical History:  ?Diagnosis Date  ? 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 a. L AT thromboembolecomty and PTA.  ? Cardiomyopathy ([place])   ? a. a. 07/2017 Echo: EF 45-50%, Gr1 DD; b. 01/2019 Echo: EF 20-25%, mod dil LV. Impaired relaxation. Nl  RV fxn. Mild to mod MR. Ao root 3.5cm.  ? CHF (congestive heart failure) ([place])   ? CKD (chronic kidney disease), stage III ([place])   ? HFrEF (heart failure with reduced ejection fraction) ([address])   ? a. 07/2017 Echo: EF 45-50%, Gr1 DD; b. 01/2019 Echo: EF 20-25%.  ? HIV (human immunodeficiency virus infection) ([place])   ? a.   Diagnosed in 2015  ? Hypertension   ? LBBB (left bundle branch block)   ? Pancytopenia ([place])   ? Tobacco abuse   ? ?Past Surgical History:  ?Procedure Laterality Date  ? CHOLECYSTECTOMY    ? LOWER EXTREMITY ANGIOGRAPHY Left 01/25/2019  ? Procedure: Lower Extremity Angiography;  Surgeon: Katha Cabal, MD;  Location: [facility];  Service: Cardiovascular;  Laterality: Left;  ? RIGHT/LEFT HEART CATH AND CORONARY ANGIOGRAPHY Bilateral 02/15/2019  ? Procedure: RIGHT/LEFT HEART CATH AND CORONARY ANGIOGRAPHY;  Surgeon: Minna Merritts, MD;  Location: [facility];  Service: Cardiovascular;  Laterality: Bilateral;  ? ?Social History:  reports that she has been smoking cigarettes. She has never used smokeless tobacco. She reports that she does not drink alcohol and does not use drugs. ? ?Allergies  ?Allergen Reactions  ? Neosporin [Bacitracin-Polymyxin B] Rash  ? Sulfa Antibiotics Rash  ? ? ?Family History  ?Problem Relation Age of Onset  ? Hypertension Mother   ? Cerebral aneurysm Mother   ? ? ?Prior to Admission medications   ?Medication Sig Start Date End Date Taking? Authorizing Provider  ?apixaban (ELIQUIS) 5 MG TABS tablet Take 1 tablet (5 mg total) by mouth 2 (two) times daily. 01/26/19  Yes Demetrios Loll, MD  ?B Complex-C-Folic Acid (RENA-VITE RX) 1 MG TABS Take 1 tablet by mouth daily. 07/17/21  Yes [provider]  ?bictegravir-emtricitabine-tenofovir AF (BIKTARVY) 50-200-25 MG TABS tablet Take 1 tablet by mouth daily.   Yes [provider]  ?calcium acetate (PHOSLO) 667 MG capsule Take 2,001 mg by mouth 3 (three) times daily. 04/22/21  Yes [provider]  ?carvedilol (COREG) 12.5 MG tablet Take 12.5 mg by mouth 2 (two) times daily with a meal.   Yes [provider]  ?furosemide (LASIX) 40 MG tablet Take 1 tablet (40 mg total) by mouth daily. ?Patient taking differently: Take 40 mg by mouth 2 (two) times daily. 06/13/19 09/24/21 Yes Paduchowski, Lennette Bihari, MD   ?gabapentin (NEURONTIN) 100 MG capsule Take 300 mg by mouth at bedtime.   Yes [provider]  ?HYDROcodone-acetaminophen (NORCO) 5-325 MG tablet Take 1 tablet by mouth every 6 (six) hours as needed for up to 3 days for severe pain. 09/24/21 09/27/21 Yes Lucrezia Starch, MD  ?Multiple Vitamin (MULTIVITAMIN WITH MINERALS) TABS tablet Take 1 tablet by mouth daily.   Yes [provider]  ?pantoprazole (PROTONIX) 40 MG tablet Take 1 tablet (40 mg total) by mouth daily. 09/24/21 10/24/21 Yes Lucrezia Starch, MD  ?prednisoLONE acetate (PRED FORTE) 1 % ophthalmic suspension Place 1 drop into the right eye at bedtime. 07/16/21  Yes [provider]  ?carvedilol (COREG) 25 MG tablet Take 1 tablet (25 mg total) by mouth 2 (two) times daily with a meal. ?Patient not taking: Reported on 09/24/2021 02/28/19   Rise Mu, PA-C  ?dapsone 100 MG tablet Take 100 mg by mouth daily. 09/15/21   [provider]  ? ? ?Physical Exam: ?Vitals:  ? 09/24/21 1330 09/24/21 1400 09/24/21 1430 09/24/21 1530  ?BP: 120/85 114/87 (!) 120/96 (!) 137/107  ?Pulse:  71 69 76 74  ?Resp:  16 16 14   ?Temp:    98.1 ?F (36.7 ?C)  ?TempSrc:    Oral  ?SpO2: 95% 99% 100% 96%  ?Weight:      ?Height:      ? ?T 98.2  114/87  69  RR 16 ?General - chronically ill appearing woman in no acute distress ?HEENT - old right facial scarring from shingles. C&S clear, oropharynx w/o lesions ?Neck Supple ?Pul - decreased BS at bases, mild rales, no wheezing, no increased WOB at rest or with step test. ?Cor - JVD to angle of jaw. Quiet precordium. RRR, II/VI systolic mm best at apex, 2+ radial and pedal pulses ?Chest - HD catheter upper right chest ?Abd - BS + x 4, no guarding or rebound, mild-moderate tenderness worse at RUB, question of palpable liver edge at cost margin.. Tender RUQ to deep palp and percussion ?GU-deferred ?Ext - w/o deformity ?Neuro  - no focal deficits ?Psych - tearful, frustrated. ?Data Reviewed:  ? ?Lab: BUN 26, C4r 4.83,  Mg 2.6 WBC 2.6 w/ nl diff, Hgb 11.6    ?CT A/P moderate free fluid, Left lung collapse with small efusion -stable from prior studies. EXK NSR , intraventricular conduction block ?Last Echo 8/124/20 EF 20-25%, ?DDD

## 2021-09-24 NOTE — Assessment & Plan Note (Addendum)
Patient with chronic dyspnea. She reports getting short of breath going from bedroom to bathroom or attempting home chores. She does not have a pulse oximeter at home. Her DOE is worse when she has abdominal discomfort. CT A/P reealed moderate free fluid similar to prior exam. Left lung base collapse with small effusion is stable.  CXR with improved left pleural effusion. She dialyized 4.2 Kg fluid 09/23/21. At initial ED presentation O2 sat dropped to 85% with step-test. At discharge exam O2 sat to 91% with >1 minute step test. No indication for home O2.  ? ?Plan Continue present meds ? Keep HD appointment at 0600 hrs 09/25/21 ?

## 2021-09-24 NOTE — ED Notes (Signed)
Pt gone to Xray at this time.  ?

## 2021-09-24 NOTE — ED Notes (Signed)
Pt reports that she only produces a very small amount of urine, once every couple of days. MD aware, can wait for urine specimen. ?

## 2021-09-24 NOTE — ED Notes (Signed)
AVS with prescriptions provided to and discussed with patient. Pt verbalizes understanding of discharge instructions and denies any questions or concerns at this time. Pt ambulated out of department independently with steady gait. ? ?

## 2021-09-24 NOTE — Progress Notes (Signed)
?[facility]  ?ROUNDING NOTE  ? ?Subjective:  ? ?Rebecca Bridges is a 46 year old female with past medical history including hypertension, pancytopenia, HIV, tobacco abuse, and end-stage renal disease on hemodialysis.  Patient presents to the emergency department with complaints of shortness of breath and abdominal distention. ? ?Patient is known to our practice and receives outpatient dialysis treatments at [facility] on a Tuesday Thursday Saturday schedule.  Last treatment received on Tuesday, received full treatment with 4.3 L of fluid removal. Patient states she had cramping toward the end of treatment. Patient states her abdominal distention would resolve with dialysis, but has not lately. Denies shortness of breath at rest, but mild shortness of breath with exertion. Tolerating meals without nausea or vomiting. Reports decreased appetite due to abdominal discomfort. Denies diarrhea.  ? ?Ct abd/pelvis shows some free fluid in abdomen and pelvis with body wall edema, not acute. Chest xray shows left pleural effusion improving.  ? ? ?Objective:  ?Vital signs in last 24 hours:  ?Temp:  [98.2 ?F (36.8 ?C)] 98.2 ?F (36.8 ?C) (04/12 1058) ?Pulse Rate:  [63-82] 69 (04/12 1400) ?Resp:  [14-20] 16 (04/12 1400) ?BP: (114-139)/(85-105) 114/87 (04/12 1400) ?SpO2:  [95 %-99 %] 99 % (04/12 1400) ?Weight:  [52.2 kg] 52.2 kg (04/12 0918) ? ?Weight change:  ?Filed Weights  ? 09/24/21 0918  ?Weight: 52.2 kg  ? ? ?Intake/Output: ?No intake/output data recorded. ?  ?Intake/Output this shift: ? No intake/output data recorded. ? ?Physical Exam: ?General: NAD, resting in bed  ?Head: Normocephalic, atraumatic. Moist oral mucosal membranes  ?Eyes: Anicteric  ?Lungs:  Clear to auscultation, normal effort  ?Heart: Regular rate and rhythm  ?Abdomen:  Soft, nontender, distention left>right  ?Extremities:  no peripheral edema.  ?Neurologic: Nonfocal, moving all four extremities  ?Skin: No lesions  ?Access: Rt cjest permcath   ? ? ?Basic Metabolic Panel: ?Recent Labs  ?Lab 09/24/21 ?0950  ?NA 136  ?K 4.0  ?CL 102  ?CO2 23  ?GLUCOSE 95  ?BUN 26*  ?CREATININE 4.83*  ?CALCIUM 9.1  ? ? ?Liver Function Tests: ?Recent Labs  ?Lab 09/24/21 ?0950  ?AST 34  ?ALT 16  ?ALKPHOS 68  ?BILITOT 1.2  ?PROT 9.2*  ?ALBUMIN 3.3*  ? ?Recent Labs  ?Lab 09/24/21 ?0950  ?LIPASE 44  ? ?No results for input(s): AMMONIA in the last 168 hours. ? ?CBC: ?Recent Labs  ?Lab 09/24/21 ?0950  ?WBC 2.6*  ?HGB 11.6*  ?HCT 39.4  ?MCV 109.4*  ?PLT 74*  ? ? ?Cardiac Enzymes: ?No results for input(s): CKTOTAL, CKMB, CKMBINDEX, TROPONINI in the last 168 hours. ? ?BNP: ?Invalid input(s): POCBNP ? ?CBG: ?No results for input(s): GLUCAP in the last 168 hours. ? ?Microbiology: ?Results for orders placed or performed during the hospital encounter of 09/24/21  ?Resp Panel by RT-PCR (Flu A&B, Covid) Nasopharyngeal Swab     Status: None  ? Collection Time: 09/24/21 11:00 AM  ? Specimen: Nasopharyngeal Swab; Nasopharyngeal(NP) swabs in vial transport medium  ?Result Value Ref Range Status  ? SARS Coronavirus 2 by RT PCR NEGATIVE NEGATIVE Final  ?  Comment: (NOTE) ?SARS-CoV-2 target nucleic acids are NOT DETECTED. ? ?The SARS-CoV-2 RNA is generally detectable in upper respiratory ?specimens during the acute phase of infection. The lowest ?concentration of SARS-CoV-2 viral copies this assay can detect is ?138 copies/mL. A negative result does not preclude SARS-Cov-2 ?infection and should not be used as the sole basis for treatment or ?other patient management decisions. A negative result may occur with  ?  improper specimen collection/handling, submission of specimen other ?than nasopharyngeal swab, presence of viral mutation(s) within the ?areas targeted by this assay, and inadequate number of viral ?copies(<138 copies/mL). A negative result must be combined with ?clinical observations, patient history, and epidemiological ?information. The expected result is Negative. ? ?Fact Sheet for  Patients:  ?EntrepreneurPulse.com.au ? ?Fact Sheet for Healthcare Providers:  ?IncredibleEmployment.be ? ?This test is no t yet approved or cleared by the [place] FDA and  ?has been authorized for detection and/or diagnosis of SARS-CoV-2 by ?FDA under an Emergency Use Authorization (EUA). This EUA will remain  ?in effect (meaning this test can be used) for the duration of the ?COVID-19 declaration under Section 564(b)(1) of the Act, 21 ?U.S.C.section 360bbb-3(b)(1), unless the authorization is terminated  ?or revoked sooner.  ? ? ?  ? Influenza A by PCR NEGATIVE NEGATIVE Final  ? Influenza B by PCR NEGATIVE NEGATIVE Final  ?  Comment: (NOTE) ?The Xpert Xpress SARS-CoV-2/FLU/RSV plus assay is intended as an aid ?in the diagnosis of influenza from Nasopharyngeal swab specimens and ?should not be used as a sole basis for treatment. Nasal washings and ?aspirates are unacceptable for Xpert Xpress SARS-CoV-2/FLU/RSV ?testing. ? ?Fact Sheet for Patients: ?EntrepreneurPulse.com.au ? ?Fact Sheet for Healthcare Providers: ?IncredibleEmployment.be ? ?This test is not yet approved or cleared by the [place] FDA and ?has been authorized for detection and/or diagnosis of SARS-CoV-2 by ?FDA under an Emergency Use Authorization (EUA). This EUA will remain ?in effect (meaning this test can be used) for the duration of the ?COVID-19 declaration under Section 564(b)(1) of the Act, 21 U.S.C. ?section 360bbb-3(b)(1), unless the authorization is terminated or ?revoked. ? ?Performed at [facility], [place], ?[place] [address] ?  ? ? ?Coagulation Studies: ?No results for input(s): LABPROT, INR in the last 72 hours. ? ?Urinalysis: ?No results for input(s): COLORURINE, LABSPEC, [place], GLUCOSEU, HGBUR, BILIRUBINUR, KETONESUR, PROTEINUR, UROBILINOGEN, NITRITE, LEUKOCYTESUR in the last 72 hours. ? ?Invalid input(s): APPERANCEUR   ? ? ?Imaging: ?CT ABDOMEN PELVIS WO CONTRAST ? ?Result Date: 09/24/2021 ?CLINICAL DATA:  Abdominal pain.  Acute and nonlocalized. EXAM: CT ABDOMEN AND PELVIS WITHOUT CONTRAST TECHNIQUE: Multidetector CT imaging of the abdomen and pelvis was performed following the standard protocol without IV contrast. RADIATION DOSE REDUCTION: This exam was performed according to the departmental dose-optimization program which includes automated exposure control, adjustment of the mA and/or kV according to patient size and/or use of iterative reconstruction technique. COMPARISON:  09/09/2021 FINDINGS: Lower chest: Heart is enlarged. Left base collapse/consolidation noted with small left pleural effusion similar to prior. Hepatobiliary: No suspicious focal abnormality in the liver on this study without intravenous contrast. Nodular liver contour noted previously is less prominent today. Gallbladder surgically absent. No intrahepatic or extrahepatic biliary dilation. Pancreas: No focal mass lesion. No dilatation of the main duct. No intraparenchymal cyst. No peripancreatic edema. Spleen: Spleen upper normal for size at 14.1 cm. No focal mass lesion. Adrenals/Urinary Tract: No adrenal nodule or mass. Right kidney unremarkable. 2 mm nonobstructing stones are seen in the upper pole and interpolar regions of the left kidney. No hydroureteronephrosis. The urinary bladder appears normal for the degree of distention. Stomach/Bowel: Stomach is moderately distended with food and fluid. Duodenum is normally positioned as is the ligament of Treitz. No small bowel wall thickening. No small bowel dilatation. The terminal ileum is normal. The appendix is normal. No gross colonic mass. No colonic wall thickening. Vascular/Lymphatic: There is moderate atherosclerotic calcification of the abdominal aorta  without aneurysm. There is no gastrohepatic or hepatoduodenal ligament lymphadenopathy. No retroperitoneal or mesenteric lymphadenopathy. No pelvic  sidewall lymphadenopathy. Reproductive: The uterus is unremarkable.  There is no adnexal mass. Other: Small to moderate volume free fluid identified in the abdomen and pelvis, similar to prior. Musculoskeletal: Dif

## 2021-09-24 NOTE — Assessment & Plan Note (Signed)
BP is stable. Continue home medications ?

## 2021-09-24 NOTE — ED Provider Notes (Addendum)
? ?[facility] ?Provider Note ? ? ? Event Date/Time  ? First MD Initiated Contact with Patient 09/24/21 [phone]   ?  (approximate) ? ? ?History  ? ?Shortness of Breath and Abdominal Pain ? ? ?HPI ? ?Rebecca Bridges is a 46 y.o. 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  Patient did get dialyzed yesterday but states she stopped 30 minutes prior to completing it due to cramping.  She states she was told she has an extra fluid that could get off.  States that she has had a slight cough since yesterday and a little bit of nausea and feels pain in the left upper quadrant of her abdomen.  She feels her abdomen is little more distended than usual.  He still makes urine and has been taking her Lasix and has not noticed any change there.  No new chest pain, fevers, headache, earache, sore throat or new back or extremity pain.  No recent tobacco abuse, EtOH use illicit drug use.  Denies any other acute concerns at this time. ? ?  ? ? ?Physical Exam  ?Triage Vital Signs: ?ED Triage Vitals  ?Enc Vitals Group  ?   BP 09/24/21 0917 (!) 139/105  ?   Pulse Rate 09/24/21 0917 82  ?   Resp 09/24/21 0917 20  ?   Temp --   ?   Temp Source 09/24/21 0917 Oral  ?   SpO2 09/24/21 0921 97 %  ?   Weight 09/24/21 0918 115 lb (52.2 kg)  ?   Height 09/24/21 0918 5\' 3"  (1.6 m)  ?   Head Circumference --   ?   Peak Flow --   ?   Pain Score 09/24/21 0918 9  ?   Pain Loc --   ?   Pain Edu? --   ?   Excl. in [place]? --   ? ? ?Most recent vital signs: ?Vitals:  ? 09/24/21 1130 09/24/21 1200  ?BP: (!) 122/96 (!) 136/99  ?Pulse: 63 76  ?Resp:  16  ?Temp:     ?SpO2: 96% 98%  ? ? ?General: Awake, no distress.  ?CV:  Good peripheral perfusion.  2+ radial pulses. ?Resp:  Normal effort.  Very faint rales at the bases. ?Abd:  No distention.  Tender in the left upper quadrant and epigastrium. ?Other:   ? ? ?ED Results / Procedures / Treatments  ?Labs ?(all labs ordered are listed, but only abnormal results are displayed) ?Labs Reviewed  ?COMPREHENSIVE METABOLIC PANEL - Abnormal; Notable for the following components:  ?    Result Value  ? BUN 26 (*)   ? Creatinine, Ser 4.83 (*)   ? Total Protein 9.2 (*)   ? Albumin 3.3 (*)   ? GFR, Estimated 11 (*)   ? All other components within normal limits  ?CBC - Abnormal; Notable for the following components:  ? WBC 2.6 (*)   ? RBC 3.60 (*)   ? Hemoglobin 11.6 (*)   ? MCV 109.4 (*)   ? MCHC 29.4 (*)   ? RDW 17.3 (*)   ? Platelets 74 (*)   ? nRBC 1.2 (*)   ?  All other components within normal limits  ?BRAIN NATRIURETIC PEPTIDE - Abnormal; Notable for the following components:  ? B Natriuretic Peptide >4,500.0 (*)   ? All other components within normal limits  ?TROPONIN I (HIGH SENSITIVITY) - Abnormal; Notable for the following components:  ? Troponin I (High Sensitivity) 31 (*)   ? All other components within normal limits  ?TROPONIN I (HIGH SENSITIVITY) - Abnormal; Notable for the following components:  ? Troponin I (High Sensitivity) 31 (*)   ? All other components within normal limits  ?RESP PANEL BY RT-PCR (FLU A&B, COVID) ARPGX2  ?LIPASE, BLOOD  ?HCG, QUANTITATIVE, PREGNANCY  ?URINALYSIS, ROUTINE W REFLEX MICROSCOPIC  ?POC URINE PREG, ED  ? ? ? ?EKG ? ?EKG is remarkable sinus rhythm with a ventricular rate of 77 and bundle branch block morphology with a QTc interval of 522 and T wave inversion in lead III and ST depression in lead III as well as nonspecific change in lead I and aVL without other clear evidence of acute ischemia. ? ? ?RADIOLOGY ? ?Chest x-ray my interpretation shows baseline cardiomegaly with improvement in previously  noted left-sided pleural effusion without clear pneumothorax.  There is some consolidation at the left base of unclear etiology i.e. either infectious or possibly atelectasis.  I also viewed radiology interpretation. ? ?CT abdomen pelvis, interpretation shows no evidence of diverticulitis, SBO, perforation or left-sided ureteral stone.  I do not see evidence of large AAA.  I also reviewed radiologist interpretation and agree with their findings of small to moderate free fluid in the abdomen and pelvis with diffuse body wall edema and some left lung base collapse and/or consolidation with small left lower effusion and nonobstructing left-sided kidney stone as well as aortic atherosclerosis. ? ?PROCEDURES: ? ?Critical Care performed: Yes, see critical care procedure note(s) ? ?.Critical Care ?Performed by: Lucrezia Starch, MD ?Authorized by: Lucrezia Starch, MD  ? ?Critical care provider statement:  ?  Critical care time (minutes):  30 ?  Critical care was necessary to treat or prevent imminent or life-threatening deterioration of the following conditions:  Respiratory failure ?  Critical care was time spent personally by me on the following activities:  Development of treatment plan with patient or surrogate, discussions with consultants, evaluation of patient's response to treatment, examination of patient, ordering and review of laboratory studies, ordering and review of radiographic studies, ordering and performing treatments and interventions, pulse oximetry, re-evaluation of patient's condition and review of old charts ? ? ? ?MEDICATIONS ORDERED IN ED: ?Medications  ?pantoprazole (PROTONIX) injection 40 mg (40 mg Intravenous Given 09/24/21 1100)  ?fentaNYL (SUBLIMAZE) injection 50 mcg (50 mcg Intravenous Given 09/24/21 1101)  ?sucralfate (CARAFATE) tablet 1 g (1 g Oral Given 09/24/21 1155)  ?alum & mag hydroxide-simeth (MAALOX/MYLANTA) 200-200-20 MG/5ML suspension 30 mL (30 mLs Oral Given 09/24/21 1155)   ?furosemide (LASIX) injection 80 mg (80 mg Intravenous Given 09/24/21 1202)  ? ? ? ?IMPRESSION / MDM / ASSESSMENT AND PLAN / ED COURSE  ?I reviewed the triage vital signs and the nursing notes. ?             ?               ? ?Differential diagnosis includes, but is not limited to volume overload, pneumonia, GERD, peptic ulcer disease, and ACS with low suspicion for PE given she reports compliance with her Eliquis. ? ?EKG is remarkable sinus rhythm with a ventricular rate of 77 and bundle branch block morphology with  a QTc interval of 522 and T wave inversion in lead III and ST depression in lead III as well as nonspecific change in lead I and aVL without other clear evidence of acute ischemia.  Opponents x2 are 31 and 31 which is suspect represents some chronic troponinemia in the setting of CKD versus mild demand in the setting of volume overload. ? ?COVID influenza PCR is negative. ? ? ?Chest x-ray my interpretation shows baseline cardiomegaly with improvement in previously noted left-sided pleural effusion without clear pneumothorax.  There is some consolidation at the left base of unclear etiology i.e. either infectious or possibly atelectasis.  I also viewed radiology interpretation. ? ?CT abdomen pelvis, interpretation shows no evidence of diverticulitis, SBO, perforation or left-sided ureteral stone.  I do not see evidence of large AAA.  I also reviewed radiologist interpretation and agree with their findings of small to moderate free fluid in the abdomen and pelvis with diffuse body wall edema and some left lung base collapse and/or consolidation with small left lower effusion and nonobstructing left-sided kidney stone as well as aortic atherosclerosis. ? ?  ?Lipase not consistent with acute pancreatitis.  MP is consistent with history of ESRD without any other significant acute electrolyte or metabolic derangements.  CBC shows WBC count of 2.2 compared to 3 2 weeks ago and hemoglobin of 11.6 compared to  11.52 weeks ago.  Platelets are 74 compared to 111 2 weeks ago.  BNP is greater than 4500 supporting concern for diagnosis of volume overload. ? ?Patient ambulated and able to maintain SPO2 greater than 92% on repe

## 2021-10-30 ENCOUNTER — Ambulatory Visit
Admission: RE | Admit: 2021-10-30 | Discharge: 2021-10-30 | Disposition: A | Discharge status: Home / Self Care | Payer: Medicaid Other | Source: Ambulatory Visit | Attending: Internal Medicine | Admitting: Internal Medicine

## 2021-10-30 ENCOUNTER — Other Ambulatory Visit: Payer: Self-pay | Admitting: Internal Medicine

## 2021-10-30 DIAGNOSIS — R188 Other ascites: Secondary | ICD-10-CM

## 2021-10-30 NOTE — Procedures (Signed)
Patient presented to the [facility] [place] department today for possible paracentesis. Limited ultrasound imaging revealed insufficient fluid for safe percutaneous access. Findings were discussed with the patient. No procedure performed. Images saved in Epic. Dr. Denna Haggard notified.  Soyla Dryer, [place] [phone] 10/30/2021, 3:35 PM

## 2022-01-17 ENCOUNTER — Other Ambulatory Visit: Payer: Self-pay

## 2022-01-17 ENCOUNTER — Emergency Department: Payer: Medicaid Other

## 2022-01-17 ENCOUNTER — Encounter: Payer: Self-pay | Admitting: Emergency Medicine

## 2022-01-17 ENCOUNTER — Inpatient Hospital Stay: Payer: Medicaid Other

## 2022-01-17 ENCOUNTER — Observation Stay
Admission: EM | Admit: 2022-01-17 | Discharge: 2022-01-17 | Discharge status: Against Medical Advice | Payer: Medicaid Other | Attending: Internal Medicine | Admitting: Internal Medicine

## 2022-01-17 DIAGNOSIS — F17210 Nicotine dependence, cigarettes, uncomplicated: Secondary | ICD-10-CM | POA: Diagnosis not present

## 2022-01-17 DIAGNOSIS — Z20822 Contact with and (suspected) exposure to covid-19: Secondary | ICD-10-CM | POA: Diagnosis not present

## 2022-01-17 DIAGNOSIS — J209 Acute bronchitis, unspecified: Secondary | ICD-10-CM | POA: Diagnosis present

## 2022-01-17 DIAGNOSIS — J449 Chronic obstructive pulmonary disease, unspecified: Secondary | ICD-10-CM | POA: Insufficient documentation

## 2022-01-17 DIAGNOSIS — Z7901 Long term (current) use of anticoagulants: Secondary | ICD-10-CM | POA: Diagnosis not present

## 2022-01-17 DIAGNOSIS — B20 Human immunodeficiency virus [HIV] disease: Secondary | ICD-10-CM | POA: Insufficient documentation

## 2022-01-17 DIAGNOSIS — I5023 Acute on chronic systolic (congestive) heart failure: Principal | ICD-10-CM | POA: Diagnosis present

## 2022-01-17 DIAGNOSIS — D61818 Other pancytopenia: Secondary | ICD-10-CM | POA: Diagnosis present

## 2022-01-17 DIAGNOSIS — I509 Heart failure, unspecified: Principal | ICD-10-CM

## 2022-01-17 DIAGNOSIS — Z992 Dependence on renal dialysis: Secondary | ICD-10-CM | POA: Insufficient documentation

## 2022-01-17 DIAGNOSIS — Z720 Tobacco use: Secondary | ICD-10-CM | POA: Diagnosis present

## 2022-01-17 DIAGNOSIS — Z21 Asymptomatic human immunodeficiency virus [HIV] infection status: Secondary | ICD-10-CM | POA: Diagnosis present

## 2022-01-17 DIAGNOSIS — I420 Dilated cardiomyopathy: Secondary | ICD-10-CM | POA: Diagnosis present

## 2022-01-17 DIAGNOSIS — K7460 Unspecified cirrhosis of liver: Secondary | ICD-10-CM | POA: Diagnosis present

## 2022-01-17 DIAGNOSIS — N186 End stage renal disease: Secondary | ICD-10-CM

## 2022-01-17 DIAGNOSIS — B0230 Zoster ocular disease, unspecified: Secondary | ICD-10-CM | POA: Diagnosis present

## 2022-01-17 DIAGNOSIS — I2699 Other pulmonary embolism without acute cor pulmonale: Secondary | ICD-10-CM | POA: Diagnosis not present

## 2022-01-17 DIAGNOSIS — Z79899 Other long term (current) drug therapy: Secondary | ICD-10-CM | POA: Diagnosis not present

## 2022-01-17 DIAGNOSIS — R0602 Shortness of breath: Secondary | ICD-10-CM | POA: Diagnosis present

## 2022-01-17 DIAGNOSIS — I132 Hypertensive heart and chronic kidney disease with heart failure and with stage 5 chronic kidney disease, or end stage renal disease: Secondary | ICD-10-CM | POA: Diagnosis not present

## 2022-01-17 LAB — COMPREHENSIVE METABOLIC PANEL
ALT: 12 U/L (ref 0–44)
AST: 30 U/L (ref 15–41)
Albumin: 3.5 g/dL (ref 3.5–5.0)
Alkaline Phosphatase: 71 U/L (ref 38–126)
Anion gap: 14 (ref 5–15)
BUN: 26 mg/dL — ABNORMAL HIGH (ref 6–20)
CO2: 22 mmol/L (ref 22–32)
Calcium: 9.3 mg/dL (ref 8.9–10.3)
Chloride: 102 mmol/L (ref 98–111)
Creatinine, Ser: 5.82 mg/dL — ABNORMAL HIGH (ref 0.44–1.00)
GFR, Estimated: 9 mL/min — ABNORMAL LOW (ref >60)
Glucose, Bld: 86 mg/dL (ref 70–99)
Potassium: 4.2 mmol/L (ref 3.5–5.1)
Sodium: 138 mmol/L (ref 135–145)
Total Bilirubin: 1.5 mg/dL — ABNORMAL HIGH (ref 0.3–1.2)
Total Protein: 9.1 g/dL — ABNORMAL HIGH (ref 6.5–8.1)

## 2022-01-17 LAB — CBC WITH DIFFERENTIAL/PLATELET
Abs Immature Granulocytes: 0.02 10*3/uL (ref 0.00–0.07)
Basophils Absolute: 0 10*3/uL (ref 0.0–0.1)
Basophils Relative: 0 %
Eosinophils Absolute: 0.1 10*3/uL (ref 0.0–0.5)
Eosinophils Relative: 2 %
HCT: 33.5 % — ABNORMAL LOW (ref 36.0–46.0)
Hemoglobin: 10.3 g/dL — ABNORMAL LOW (ref 12.0–15.0)
Immature Granulocytes: 1 %
Lymphocytes Relative: 18 %
Lymphs Abs: 0.5 10*3/uL — ABNORMAL LOW (ref 0.7–4.0)
MCH: 32.4 pg (ref 26.0–34.0)
MCHC: 30.7 g/dL (ref 30.0–36.0)
MCV: 105.3 fL — ABNORMAL HIGH (ref 80.0–100.0)
Monocytes Absolute: 0.2 10*3/uL (ref 0.1–1.0)
Monocytes Relative: 8 %
Neutro Abs: 1.9 10*3/uL (ref 1.7–7.7)
Neutrophils Relative %: 71 %
Platelets: 71 10*3/uL — ABNORMAL LOW (ref 150–400)
RBC: 3.18 MIL/uL — ABNORMAL LOW (ref 3.87–5.11)
RDW: 17.5 % — ABNORMAL HIGH (ref 11.5–15.5)
WBC: 2.7 10*3/uL — ABNORMAL LOW (ref 4.0–10.5)
nRBC: 0 % (ref 0.0–0.2)

## 2022-01-17 LAB — PROTIME-INR
INR: 1.6 — ABNORMAL HIGH (ref 0.8–1.2)
Prothrombin Time: 18.8 seconds — ABNORMAL HIGH (ref 11.4–15.2)

## 2022-01-17 LAB — SARS CORONAVIRUS 2 BY RT PCR: SARS Coronavirus 2 by RT PCR: NEGATIVE

## 2022-01-17 LAB — HEPATITIS B CORE ANTIBODY, TOTAL: Hep B Core Total Ab: NONREACTIVE

## 2022-01-17 LAB — HEPATITIS B SURFACE ANTIGEN: Hepatitis B Surface Ag: NONREACTIVE

## 2022-01-17 LAB — HEPATITIS B CORE ANTIBODY, IGM: Hep B C IgM: NONREACTIVE

## 2022-01-17 LAB — LACTIC ACID, PLASMA: Lactic Acid, Venous: 1.6 mmol/L (ref 0.5–1.9)

## 2022-01-17 LAB — BRAIN NATRIURETIC PEPTIDE: B Natriuretic Peptide: >4500 pg/mL — ABNORMAL HIGH (ref 0.0–100.0)

## 2022-01-17 MED ORDER — NICOTINE 14 MG/24HR TD PT24: 14.0000 mg | MEDICATED_PATCH | Freq: Every day | TRANSDERMAL | Status: DC

## 2022-01-17 MED ORDER — VALACYCLOVIR HCL 500 MG PO TABS: 500.0000 mg | ORAL_TABLET | Freq: Every day | ORAL | Status: DC

## 2022-01-17 MED ORDER — VANCOMYCIN HCL IN DEXTROSE 1-5 GM/200ML-% IV SOLN
1000.0000 mg | Freq: Once | INTRAVENOUS | Status: AC
  Administered 2022-01-17: 1000 mg via INTRAVENOUS
  Filled 2022-01-17: qty 200

## 2022-01-17 MED ORDER — SODIUM CHLORIDE 0.9% FLUSH
3.0000 mL | Freq: Two times a day (BID) | INTRAVENOUS | Status: DC
Start: 2022-01-17 — End: 2022-01-18

## 2022-01-17 MED ORDER — SODIUM CHLORIDE 0.9 % IV SOLN: 250.0000 mL | INTRAVENOUS | Status: DC | PRN

## 2022-01-17 MED ORDER — WARFARIN SODIUM 2 MG PO TABS
2.0000 mg | ORAL_TABLET | Freq: Once | ORAL | Status: AC
  Administered 2022-01-17: 2 mg via ORAL
  Filled 2022-01-17: qty 1

## 2022-01-17 MED ORDER — ACETAMINOPHEN 325 MG PO TABS: 650.0000 mg | ORAL_TABLET | Freq: Four times a day (QID) | ORAL | Status: DC | PRN

## 2022-01-17 MED ORDER — WARFARIN - PHARMACIST DOSING INPATIENT
Freq: Every day | Status: DC
  Filled 2022-01-17: qty 1

## 2022-01-17 MED ORDER — DOXYCYCLINE HYCLATE 100 MG PO TABS
100.0000 mg | ORAL_TABLET | Freq: Once | ORAL | Status: AC
  Administered 2022-01-17: 100 mg via ORAL
  Filled 2022-01-17: qty 1

## 2022-01-17 MED ORDER — CARVEDILOL 6.25 MG PO TABS: 12.5000 mg | ORAL_TABLET | Freq: Two times a day (BID) | ORAL | Status: DC

## 2022-01-17 MED ORDER — SODIUM CHLORIDE 0.9 % IV SOLN
2.0000 g | Freq: Once | INTRAVENOUS | Status: AC
  Administered 2022-01-17: 2 g via INTRAVENOUS
  Filled 2022-01-17: qty 12.5

## 2022-01-17 MED ORDER — PANTOPRAZOLE SODIUM 40 MG PO TBEC: 40.0000 mg | DELAYED_RELEASE_TABLET | Freq: Every day | ORAL | Status: DC

## 2022-01-17 MED ORDER — HYDROMORPHONE HCL 1 MG/ML IJ SOLN
0.5000 mg | Freq: Once | INTRAMUSCULAR | Status: AC
  Administered 2022-01-17: 0.5 mg via INTRAVENOUS
  Filled 2022-01-17: qty 0.5

## 2022-01-17 MED ORDER — BICTEGRAVIR-EMTRICITAB-TENOFOV 50-200-25 MG PO TABS
1.0000 | ORAL_TABLET | Freq: Every day | ORAL | Status: DC
Start: 2022-01-17 — End: 2022-01-18
  Filled 2022-01-17: qty 1

## 2022-01-17 MED ORDER — CALCIUM ACETATE (PHOS BINDER) 667 MG PO CAPS
2001.0000 mg | ORAL_CAPSULE | Freq: Three times a day (TID) | ORAL | Status: DC
  Filled 2022-01-17: qty 3

## 2022-01-17 MED ORDER — ADULT MULTIVITAMIN W/MINERALS CH: 1.0000 | ORAL_TABLET | Freq: Every day | ORAL | Status: DC

## 2022-01-17 MED ORDER — SODIUM CHLORIDE 0.9% FLUSH: 3.0000 mL | INTRAVENOUS | Status: DC | PRN

## 2022-01-17 MED ORDER — LACTATED RINGERS IV SOLN: INTRAVENOUS | Status: DC

## 2022-01-17 MED ORDER — APIXABAN 5 MG PO TABS: 5.0000 mg | ORAL_TABLET | Freq: Two times a day (BID) | ORAL | Status: DC

## 2022-01-17 MED ORDER — CHLORHEXIDINE GLUCONATE CLOTH 2 % EX PADS: 6.0000 | MEDICATED_PAD | Freq: Every day | CUTANEOUS | Status: DC

## 2022-01-17 MED ORDER — DAPSONE 100 MG PO TABS
100.0000 mg | ORAL_TABLET | Freq: Every day | ORAL | Status: DC
  Filled 2022-01-17: qty 1

## 2022-01-17 MED ORDER — FUROSEMIDE 40 MG PO TABS: 40.0000 mg | ORAL_TABLET | Freq: Two times a day (BID) | ORAL | Status: DC

## 2022-01-17 MED ORDER — GABAPENTIN 300 MG PO CAPS: 300.0000 mg | ORAL_CAPSULE | Freq: Every day | ORAL | Status: DC

## 2022-01-17 MED ORDER — IPRATROPIUM-ALBUTEROL 0.5-2.5 (3) MG/3ML IN SOLN
3.0000 mL | Freq: Once | RESPIRATORY_TRACT | Status: AC
  Administered 2022-01-17: 3 mL via RESPIRATORY_TRACT
  Filled 2022-01-17: qty 3

## 2022-01-17 MED ORDER — ACETAMINOPHEN 650 MG RE SUPP: 650.0000 mg | Freq: Four times a day (QID) | RECTAL | Status: DC | PRN

## 2022-01-17 MED ORDER — RENA-VITE PO TABS
1.0000 | ORAL_TABLET | Freq: Every day | ORAL | Status: DC
  Filled 2022-01-17: qty 1

## 2022-01-17 MED ORDER — DOXYCYCLINE HYCLATE 100 MG PO TABS: 100.0000 mg | ORAL_TABLET | Freq: Two times a day (BID) | ORAL | Status: DC

## 2022-01-17 NOTE — Assessment & Plan Note (Addendum)
Most likely secondary to known HIV/AIDS Thrombocytopenia appears stable without evidence of bleeding Stable

## 2022-01-17 NOTE — Assessment & Plan Note (Signed)
Patient has a history of HIV/AIDS Last CD4 count of less than 100 from 2 months ago Continue dapsone Continue Biktarvy Follow-up with ID upon discharge

## 2022-01-17 NOTE — ED Provider Notes (Signed)
[facility] Provider Note    Event Date/Time   First MD Initiated Contact with Patient 01/17/22 [phone]     (approximate)   History   Cough, Shortness of Breath, Chills, and Generalized Body Aches   HPI  Rebecca Bridges is a 46 y.o. female history of CHF CKD HIV/AIDS (CD4 count 98 by review of care everywhere) presents to the ER for evaluation of productive cough for the past 24 hours.  States she was around her Eldridge Abrahams has been sick with a cough.  She denies any history of COPD but does smoke.  States that she is having worsening orthopnea.  Has been feeling hot and cold but no measured temperatures.  Nausea vomiting or diarrhea.     Physical Exam   Triage Vital Signs: ED Triage Vitals  Enc Vitals Group     BP 01/17/22 0830 (!) 133/96     Pulse Rate 01/17/22 0830 79     Resp 01/17/22 0830 (!) 26     Temp --      Temp src --      SpO2 01/17/22 0830 100 %     Weight 01/17/22 0811 114 lb 10.2 oz (52 kg)     Height 01/17/22 0811 5\' 3"  (1.6 m)     Head Circumference --      Peak Flow --      Pain Score 01/17/22 0811 6     Pain Loc --      Pain Edu? --      Excl. in [place]? --     Most recent vital signs: Vitals:   01/17/22 0830 01/17/22 0852  BP: (!) 133/96   Pulse: 79   Resp: (!) 26   Temp:  97.7 F (36.5 C)  SpO2: 100%      Constitutional: Alert, chronically ill appearing Eyes: Conjunctivae are normal.  Head: Atraumatic. Nose: No congestion/rhinnorhea. Mouth/Throat: Mucous membranes are moist.   Neck: Painless ROM.  Cardiovascular:   Good peripheral circulation. Respiratory: mild tachypnea with coarse bs throughout lung fields Gastrointestinal: Soft and nontender.  Musculoskeletal:  no deformity Neurologic:  MAE spontaneously. No gross focal neurologic deficits are appreciated.  Skin:  Skin is warm, dry and intact. No rash noted. Psychiatric: Mood and affect are normal. Speech and behavior are normal.    ED Results / Procedures /  Treatments   Labs (all labs ordered are listed, but only abnormal results are displayed) Labs Reviewed  CBC WITH DIFFERENTIAL/PLATELET - Abnormal; Notable for the following components:      Result Value   WBC 2.7 (*)    RBC 3.18 (*)    Hemoglobin 10.3 (*)    HCT 33.5 (*)    MCV 105.3 (*)    RDW 17.5 (*)    Platelets 71 (*)    Lymphs Abs 0.5 (*)    All other components within normal limits  COMPREHENSIVE METABOLIC PANEL - Abnormal; Notable for the following components:   BUN 26 (*)    Creatinine, Ser 5.82 (*)    Total Protein 9.1 (*)    Total Bilirubin 1.5 (*)    GFR, Estimated 9 (*)    All other components within normal limits  BRAIN NATRIURETIC PEPTIDE - Abnormal; Notable for the following components:   B Natriuretic Peptide >4,500.0 (*)    All other components within normal limits  SARS CORONAVIRUS 2 BY RT PCR  CULTURE, BLOOD (ROUTINE X 2)  CULTURE, BLOOD (ROUTINE X 2)  CULTURE, BLOOD (SINGLE)  MRSA NEXT GEN BY PCR, NASAL  LACTIC ACID, PLASMA  LACTIC ACID, PLASMA     EKG  ED ECG REPORT I, Merlyn Lot, the attending physician, personally viewed and interpreted this ECG.   Date: 01/17/2022  EKG Time: 8:19  Rate: 80  Rhythm: sinus  Axis: left  Intervals:lbbb, borderline prolonged qt  ST&T Change: nonspecific st abn, no stemi    RADIOLOGY Please see ED Course for my review and interpretation.  I personally reviewed all radiographic images ordered to evaluate for the above acute complaints and reviewed radiology reports and findings.  These findings were personally discussed with the patient.  Please see medical record for radiology report.    PROCEDURES:  Critical Care performed: No  Procedures   MEDICATIONS ORDERED IN ED: Medications  lactated ringers infusion (has no administration in time range)  vancomycin (VANCOCIN) IVPB 1000 mg/200 mL premix (has no administration in time range)  ceFEPIme (MAXIPIME) 2 g in sodium chloride 0.9 % 100 mL IVPB  (has no administration in time range)  doxycycline (VIBRA-TABS) tablet 100 mg (has no administration in time range)  ipratropium-albuterol (DUONEB) 0.5-2.5 (3) MG/3ML nebulizer solution 3 mL (3 mLs Nebulization Given 01/17/22 0858)     IMPRESSION / MDM / ASSESSMENT AND PLAN / ED COURSE  I reviewed the triage vital signs and the nursing notes.                              Differential diagnosis includes, but is not limited to, Asthma, copd, CHF, pna, ptx, malignancy, Pe, anemia  Presented to the ER for evaluation of symptoms as described above.  This presenting complaint could reflect a potentially life-threatening illness therefore the patient will be placed on continuous pulse oximetry and telemetry for monitoring.  Laboratory evaluation will be sent to evaluate for the above complaints.  Presentation is complicated by her history of HIV AIDS.  Also end-stage renal disease on dialysis.  Blood work sent for the blood differential.  Chest x-ray will be ordered.   Clinical Course as of 01/17/22 1022  Sat Jan 17, 2022  0853 Chest x-ray on my review and interpretation without evidence of pneumothorax.  Not significantly changed from previous chest x-ray 3 months ago. [PR]  [phone] Patient does have pulmonary edema BNP is 4500.  COVID is negative.  Having productive cough no measured fevers but given aids may not be mounting SIRS criteria therefore will order cultures we will start empiric antibiotics.  I have consulted with nephrology as I think that she will need additional dialysis.  I will consult hospitalist for admission.  Patient agreeable to plan. [PR]  1021 Discussed the patient consultation with hospitalist.  She has requested CT of the chest without contrast to further clarify pneumonia versus edema.  Patient stable for admission. [PR]    Clinical Course User Index [PR] Merlyn Lot, MD      FINAL CLINICAL IMPRESSION(S) / ED DIAGNOSES   Final diagnoses:  Acute on chronic  congestive heart failure, unspecified heart failure type [facility])     Rx / DC Orders   ED Discharge Orders     None        Note:  This document was prepared using Dragon voice recognition software and may include unintentional dictation errors.    Merlyn Lot, MD 01/17/22 1022

## 2022-01-17 NOTE — Assessment & Plan Note (Addendum)
Patient has a known history of dilated cardiomyopathy with last known LVEF less than 15% from a 2D echocardiogram which was done 06/23 Continue carvedilol Not on an ACE/ARB due to renal disease

## 2022-01-17 NOTE — ED Notes (Signed)
PT back from dialysis- 3L removed. 3Hours completed.

## 2022-01-17 NOTE — Assessment & Plan Note (Signed)
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 Continue Valtrex which patient has been on prior to admission Follow-up with ophthalmology as an outpatient

## 2022-01-17 NOTE — Assessment & Plan Note (Addendum)
Patient has a history of pulmonary embolism as well as a history of a remote arterial clot.  Per chart review she will need to be on lifelong anticoagulation therapy and was switched from apixaban to warfarin with goal of INR between 2 and 3 following her last hospitalization for GI bleed. Pharmacy consult for management of warfarin during this hospitalization

## 2022-01-17 NOTE — Assessment & Plan Note (Signed)
As evidenced by worsening shortness of breath associated orthopnea Noted to have elevated BNP levels and imaging suggestive of pulmonary edema Continue furosemide and carvedilol Not on an ACE or ARB due to renal disease Last known LVEF of less than 15% with severe LV systolic dysfunction and mild LVH from a 2D echocardiogram which was done in 06/23 Nephrology consult for renal replacement therapy to optimize volume status

## 2022-01-17 NOTE — ED Notes (Signed)
Dr Agbata at bedside. 

## 2022-01-17 NOTE — ED Notes (Signed)
Pt taken for CT 

## 2022-01-17 NOTE — Consult Note (Signed)
ANTICOAGULATION CONSULT NOTE - Initial Consult  Pharmacy Consult for warfarin Indication:  history of PE  Allergies  Allergen Reactions   Neosporin [Bacitracin-Polymyxin B] Rash   Sulfa Antibiotics Rash    Patient Measurements: Height: 5\' 3"  (160 cm) Weight: 52 kg (114 lb 10.2 oz) IBW/kg (Calculated) : 52.4  Vital Signs: Temp: 97.5 F (36.4 C) (08/05 1238) Temp Source: Oral (08/05 0852) BP: 125/96 (08/05 1330) Pulse Rate: 75 (08/05 1330)  Labs: Recent Labs    01/17/22 0851 01/17/22 1129  HGB 10.3*  --   HCT 33.5*  --   PLT 71*  --   LABPROT  --  18.8*  INR  --  1.6*  CREATININE 5.82*  --     Estimated Creatinine Clearance: 10 mL/min (A) (by C-G formula based on SCr of 5.82 mg/dL (H)).   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 L AT thromboembolecomty and PTA.   Cardiomyopathy ([place])    a. a. 07/2017 Echo: EF 45-50%, Gr1 DD; b. 01/2019 Echo: EF 20-25%, mod dil LV. Impaired relaxation. Nl RV fxn. Mild to mod MR. Ao root 3.5cm.   CHF (congestive heart failure) (H[facility])    CKD (chronic kidney disease), stage III (HCC)    HFrEF (heart failure with reduced ejection fraction) ([place])    a. 07/2017 Echo: EF 45-50%, Gr1 DD; b. 01/2019 Echo: EF 20-25%.   HIV (human immunodeficiency virus infection) ([place])    a.  Diagnosed in 2015   Hypertension    LBBB (left bundle branch block)    Pancytopenia (HCC)    Tobacco abuse     Medications:  Scheduled:   bictegravir-emtricitabine-tenofovir AF  1 tablet Oral Daily   calcium acetate  2,001 mg Oral TID   carvedilol  12.5 mg Oral BID WC   [START ON 01/18/2022] Chlorhexidine Gluconate Cloth  6 each Topical Q0600   dapsone  100 mg Oral Daily   doxycycline  100 mg Oral Q12H   furosemide  40 mg Oral BID   gabapentin  300 mg Oral QHS   multivitamin with minerals  1 tablet Oral Daily   nicotine  14 mg Transdermal Daily   pantoprazole  40 mg Oral Daily   Rena-Vite Rx   1 tablet Oral Daily   sodium chloride flush  3 mL Intravenous Q12H   [START ON 01/18/2022] valACYclovir  500 mg Oral Daily   warfarin  2 mg Oral q1600   Warfarin - Pharmacist Dosing Inpatient   Does not apply q1600   Infusions:   sodium chloride     lactated ringers 150 mL/hr at 01/17/22 1124   PRN: sodium chloride, acetaminophen **OR** acetaminophen, sodium chloride flush Anti-infectives (From admission, onward)    Start     Dose/Rate Route Frequency Ordered Stop   01/18/22 1000  valACYclovir (VALTREX) tablet 500 mg        500 mg Oral Daily 01/17/22 1234     01/17/22 1215  bictegravir-emtricitabine-tenofovir AF (BIKTARVY) 50-200-25 MG per tablet 1 tablet        1 tablet Oral Daily 01/17/22 1210     01/17/22 1215  dapsone tablet 100 mg        100 mg Oral Daily 01/17/22 1210     01/17/22 1215  doxycycline (VIBRA-TABS) tablet 100 mg        100 mg Oral Every 12 hours 01/17/22 1212 01/22/22 0959   01/17/22 1015  doxycycline (VIBRA-TABS)  tablet 100 mg        100 mg Oral  Once 01/17/22 1001 01/17/22 1221   01/17/22 1000  vancomycin (VANCOCIN) IVPB 1000 mg/200 mL premix        1,000 mg 200 mL/hr over 60 Minutes Intravenous  Once 01/17/22 0954 01/17/22 1335   01/17/22 1000  ceFEPIme (MAXIPIME) 2 g in sodium chloride 0.9 % 100 mL IVPB        2 g 200 mL/hr over 30 Minutes Intravenous  Once 01/17/22 2458 01/17/22 1221       Assessment: Patient is a 46 y/o F with medical history including HIV / AIDS, ESRD on HD, pancytopenia, nicotine dependence, HTN, systolic CHF, 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 Patient's home dose is likely 2 mg daily per [facility] from [facility] and dispense report. Patient will continue on warfarin inpatient for management of chronic PE. Drug-drug interaction with dapsone, doxycycline, and warfarin may result in increased INR.  Date INR Warfarin  Dose  8/5 1.6 2 mg       Goal of Therapy:  INR 2-3 Monitor platelets by anticoagulation protocol: Yes   Plan: INR subtherapeutic. Will continue warfarin 2 mg daily (home dose). Daily INR, CBC at least every 3 days.  Gretel Acre, PharmD PGY1 Pharmacy Resident 01/17/2022 2:32 PM

## 2022-01-17 NOTE — ED Notes (Signed)
MD verbal ok to take viral meds for eye today from home.

## 2022-01-17 NOTE — ED Triage Notes (Signed)
Pt reports yesterday started with cough, congestion,chills and generalized bodyaches. Pt reports cough is productive with white phlem and states she is unsure of fevers. Pt reports no recent exposures.

## 2022-01-17 NOTE — Assessment & Plan Note (Signed)
Patient has a history of COPD and presents for evaluation of productive cough associated with shortness of breath. Imaging is negative for pneumonia We will treat patient empirically with doxycycline 100 mg twice daily for 5 days Smoking cessation has been discussed with patient in detail

## 2022-01-17 NOTE — Assessment & Plan Note (Signed)
Patient has a history of chronic hepatitis C and now has liver cirrhosis with evidence of portal hypertension. Stable

## 2022-01-17 NOTE — ED Notes (Addendum)
This patient would like to leave AMA for a funeral tomorrow, states that getting HD today has helped her feel completely baseline now. Denies pain. A&Ox4. Contacted son to pick her up from lobby, understands that she should not drive home. States she has her other home medications available and would rather take these at home.IP NP notified and AMA forms e-signed.

## 2022-01-17 NOTE — ED Notes (Signed)
Lunch Tray into room. Pt sleeping.

## 2022-01-17 NOTE — ED Notes (Signed)
Pt give water

## 2022-01-17 NOTE — Assessment & Plan Note (Signed)
Smoking cessation discussed with patient in detail We will place patient on nicotine transdermal patch 14 mg

## 2022-01-17 NOTE — ED Notes (Signed)
Pt to dialysis with transport.

## 2022-01-17 NOTE — Assessment & Plan Note (Signed)
Patient has a history of end-stage renal disease on hemodialysis Dialysis days are Tuesday/Thursday/Saturday Nephrology consult for renal replacement therapy during this hospitalization

## 2022-01-17 NOTE — H&P (Signed)
History and Physical    Patient: Rebecca Bridges PIR:[id number] DOB: 1975-10-04 DOA: 01/17/2022 DOS: the patient was seen and examined on 01/17/2022 PCP: [name]  Patient coming from: Home  Chief Complaint:  Chief Complaint  Patient presents with   Cough   Shortness of Breath   Chills   Generalized Body Aches   HPI: Rebecca Bridges is a 46 y.o. 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  She has had symptoms for the last 2 days and notes worsening.  She denies having any fever or chills but admits to having a sick contact (grandchild was sick). She admits to being compliant with her diet as well as her renal replacement therapy and denies any dietary indiscretions. She denies having any chest pain, no leg swelling, no headache, no nausea, no vomiting, no changes in her bowel habits, no urinary symptoms, no dizziness, no lightheadedness or any focal deficit. Chest x-ray was consistent with pulmonary edema. Nephrology already consulted for renal replacement therapy today. Chart review shows patient was recently admitted at [facility] for GI bleed and at that time was switched to warfarin from apixaban.    Review of Systems: As mentioned in the history of present illness. All other systems reviewed and are negative. 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 L AT thromboembolecomty and PTA.   Cardiomyopathy ([place])    a. a. 07/2017 Echo: EF 45-50%, Gr1 DD; b. 01/2019 Echo: EF 20-25%, mod dil LV. Impaired relaxation.  Nl RV fxn. Mild to mod MR. Ao root 3.5cm.   CHF (congestive heart failure) (HCC)    CKD (chronic kidney disease), stage III (HCC)    HFrEF (heart failure with reduced ejection fraction) ([place])    a. 07/2017 Echo: EF 45-50%, Gr1 DD; b. 01/2019 Echo: EF 20-25%.   HIV (human immunodeficiency virus infection) ([place])    a.  Diagnosed in 2015   Hypertension    LBBB (left bundle branch block)    Pancytopenia ([place])    Tobacco abuse    Past Surgical History:  Procedure Laterality Date   CHOLECYSTECTOMY     LOWER EXTREMITY ANGIOGRAPHY Left 01/25/2019   Procedure: Lower Extremity Angiography;  Surgeon: Katha Cabal, MD;  Location: [place] [facility];  Service: Cardiovascular;  Laterality: Left;   RIGHT/LEFT HEART CATH AND CORONARY ANGIOGRAPHY Bilateral 02/15/2019   Procedure: RIGHT/LEFT HEART CATH AND CORONARY ANGIOGRAPHY;  Surgeon: Minna Merritts, MD;  Location: [place] [facility];  Service: Cardiovascular;  Laterality: Bilateral;   Social History:  reports that she has been smoking cigarettes. She has never used smokeless tobacco. She reports that she does not drink alcohol and does not use drugs.  Allergies  Allergen Reactions   Neosporin [Bacitracin-Polymyxin B] Rash   Sulfa Antibiotics Rash    Family History  Problem Relation Age of Onset   Hypertension Mother    Cerebral aneurysm Mother     Prior to Admission medications   Medication Sig Start Date End Date Taking? Authorizing Provider  apixaban (ELIQUIS) 5 MG TABS tablet Take 1 tablet (5 mg total) by mouth 2 (  two) times daily. 01/26/19   Demetrios Loll, MD  B Complex-C-Folic Acid (RENA-VITE RX) 1 MG TABS Take 1 tablet by mouth daily. 07/17/21   [provider]  bictegravir-emtricitabine-tenofovir AF (BIKTARVY) 50-200-25 MG TABS tablet Take 1 tablet by mouth daily.    [provider]  calcium acetate (PHOSLO) 667 MG capsule Take 2,001 mg by mouth 3 (three) times daily. 04/22/21   [provider]  carvedilol  (COREG) 12.5 MG tablet Take 12.5 mg by mouth 2 (two) times daily with a meal.    [provider]  carvedilol (COREG) 25 MG tablet Take 1 tablet (25 mg total) by mouth 2 (two) times daily with a meal. Patient not taking: Reported on 09/24/2021 02/28/19   Rise Mu, PA-C  dapsone 100 MG tablet Take 100 mg by mouth daily. 09/15/21   [provider]  furosemide (LASIX) 40 MG tablet Take 1 tablet (40 mg total) by mouth daily. Patient taking differently: Take 40 mg by mouth 2 (two) times daily. 06/13/19 09/24/21  Harvest Dark, MD  gabapentin (NEURONTIN) 100 MG capsule Take 300 mg by mouth at bedtime.    [provider]  Multiple Vitamin (MULTIVITAMIN WITH MINERALS) TABS tablet Take 1 tablet by mouth daily.    [provider]  pantoprazole (PROTONIX) 40 MG tablet Take 1 tablet (40 mg total) by mouth daily. 09/24/21 10/24/21  Lucrezia Starch, MD  prednisoLONE acetate (PRED FORTE) 1 % ophthalmic suspension Place 1 drop into the right eye at bedtime. 07/16/21   [provider]    Physical Exam: Vitals:   01/17/22 0811 01/17/22 0830 01/17/22 0852 01/17/22 1238  BP:  (!) 133/96  130/89  Pulse:  79  80  Resp:  (!) 26  (!) 28  Temp:   97.7 F (36.5 C) (!) 97.5 F (36.4 C)  TempSrc:   Oral   SpO2:  100%  98%  Weight: 52 kg     Height: 5\' 3"  (1.6 m)      Physical Exam Vitals and nursing note reviewed.  Constitutional:      Comments: Chronically ill-appearing.  HENT:     Head: Normocephalic and atraumatic.     Mouth/Throat:     Mouth: Mucous membranes are moist.  Eyes:     Comments: Pale conjunctiva  Cardiovascular:     Rate and Rhythm: Normal rate and regular rhythm.  Pulmonary:     Effort: Pulmonary effort is normal.     Breath sounds: Examination of the right-lower field reveals rales. Examination of the left-lower field reveals rales. Rales present.  Abdominal:     General: Bowel sounds are normal.     Palpations: Abdomen is soft. There is  splenomegaly.  Musculoskeletal:        General: Normal range of motion.     Cervical back: Normal range of motion and neck supple.  Skin:    General: Skin is warm and dry.  Neurological:     General: No focal deficit present.     Mental Status: She is alert.     Motor: Weakness present.  Psychiatric:        Mood and Affect: Mood normal.        Behavior: Behavior normal.     Data Reviewed: Relevant notes from primary care and specialist visits, past discharge summaries as available in EHR, including Care Everywhere. Prior diagnostic testing as pertinent to current admission diagnoses Updated medications and problem lists for reconciliation ED course, including vitals, labs,  imaging, treatment and response to treatment Triage notes, nursing and pharmacy notes and ED provider's notes Notable results as noted in HPI Labs reviewed.  Lactic acid 1.6, PT 18.8, INR 1.6, BNP greater than 4500, white count 2.7, hemoglobin 10.3, hematocrit 33.5, MCV 105.3, platelet count 71, sodium 138, potassium 4.2, chloride 102, bicarb 22, glucose 86, BUN 26, creatinine 5.82, calcium 9.3, total protein 9.1, albumin 3.5, AST 30, ALT 12, alkaline phosphatase 71, total bilirubin 1.5 SARS coronavirus 2 PCR is negative Chest x-ray reviewed by me shows Cardiac enlargement. Small left pleural effusion and pulmonary vascular congestion. Findings may reflect early CHF. CT scan of the chest without contrast  Findings are similar to the previous chest CT. There are patchy and linear opacities in the left upper lower lobe. This may all be atelectasis. A component of infection is possible. Neoplastic disease is felt less likely given the stability. Small nodules stable compared to the prior CT.Stable cardiomegaly and coronary artery calcifications. Stable aortic atherosclerosis.Ascites and splenomegaly and liver changes supporting cirrhosis, also unchanged. Recommend follow-up chest CT based on the previous recommendations  from 09/09/2021, in 12-18 months from the current exam. Twelve-lead EKG reviewed by me shows normal sinus rhythm, left axis deviation, left bundle branch block. There are no new results to review at this time.  Assessment and Plan: * Acute on chronic systolic heart failure (HCC) As evidenced by worsening shortness of breath associated orthopnea Noted to have elevated BNP levels and imaging suggestive of pulmonary edema Continue furosemide and carvedilol Not on an ACE or ARB due to renal disease Last known LVEF of less than 15% with severe LV systolic dysfunction and mild LVH from a 2D echocardiogram which was done in 06/23 Nephrology consult for renal replacement therapy to optimize volume status  Dilated cardiomyopathy [facility]) Patient has a known history of dilated cardiomyopathy with last known LVEF less than 15% from a 2D echocardiogram which was done 06/23 Continue carvedilol Not on an ACE/ARB due to renal disease  ESRD on dialysis [facility]) Patient has a history of end-stage renal disease on hemodialysis Dialysis days are Tuesday/Thursday/Saturday Nephrology consult for renal replacement therapy during this hospitalization  COPD with acute bronchitis ([place]) Patient has a history of COPD and presents for evaluation of productive cough associated with shortness of breath. Imaging is negative for pneumonia We will treat patient empirically with doxycycline 100 mg twice daily for 5 days Smoking cessation has been discussed with patient in detail  Human immunodeficiency virus (HIV) disease ([place]) Patient has a history of HIV/AIDS Last CD4 count of less than 100 from 2 months ago Continue dapsone Continue Biktarvy Follow-up with ID upon discharge  Pulmonary embolism ([place]) Patient has a history of pulmonary embolism as well as a history of a remote arterial clot.  Per chart review she will need to be on lifelong anticoagulation therapy and was switched from apixaban to warfarin with goal  of INR between 2 and 3 following her last hospitalization for GI bleed. Pharmacy consult for management of warfarin during this hospitalization   Tobacco abuse Smoking cessation discussed with patient in detail We will place patient on nicotine transdermal patch 14 mg   Liver disease, chronic, with cirrhosis ([place]) Patient has a history of chronic hepatitis C and now has liver cirrhosis with evidence of portal hypertension. Stable  Pancytopenia ([place]) Most likely secondary to known HIV/AIDS Thrombocytopenia appears stable without evidence of bleeding Stable  Herpes zoster ophthalmicus, right eye Patient with a history of herpes zoster ophthalmicus  involving the right eye. Continue Valtrex which patient has been on prior to admission Follow-up with ophthalmology as an outpatient      Advance Care Planning:   Code Status: Full Code   Consults: Nephrology  Family Communication: Greater than 50% of time was spent discussing patient's condition and plan of care with her at the bedside.  All questions and concerns have been addressed.  She verbalizes understanding and agrees to the treatment plan.  Severity of Illness: The appropriate patient status for this patient is INPATIENT. Inpatient status is judged to be reasonable and necessary in order to provide the required intensity of service to ensure the patient's safety. The patient's presenting symptoms, physical exam findings, and initial radiographic and laboratory data in the context of their chronic comorbidities is felt to place them at high risk for further clinical deterioration. Furthermore, it is not anticipated that the patient will be medically stable for discharge from the hospital within 2 midnights of admission.   * I certify that at the point of admission it is my clinical judgment that the patient will require inpatient hospital care spanning beyond 2 midnights from the point of admission due to high intensity of service,  high risk for further deterioration and high frequency of surveillance required.*  Author: Collier Bullock, MD 01/17/2022 12:51 PM  For on call review [email or web address].

## 2022-01-17 NOTE — Consult Note (Signed)
PHARMACY -  BRIEF ANTIBIOTIC NOTE   Pharmacy has received consult(s) for CAP from an ED provider.  The patient's profile has been reviewed for ht/wt/allergies/indication/available labs.    One time order(s) placed for cefepime and vancomycin  Further antibiotics/pharmacy consults should be ordered by admitting physician if indicated.                       Thank you,  Gretel Acre, PharmD PGY1 Pharmacy Resident 01/17/2022 10:00 AM

## 2022-01-17 NOTE — ED Notes (Signed)
Pt taken for xray

## 2022-01-17 NOTE — Progress Notes (Signed)
HD of 3hrs completed. 71.9L total volume processed. No complications noted. Report given to Griffith Citron, RN. Total uf removed: 3069ml Post hd v/s: 97.7 144/91(111) 79 20 100% Post weight: 51kgs

## 2022-01-18 LAB — HEPATITIS B SURFACE ANTIBODY, QUANTITATIVE: Hep B S AB Quant (Post): <3.1 m[IU]/mL — ABNORMAL LOW (ref >9.9)

## 2022-01-19 ENCOUNTER — Emergency Department: Payer: Medicaid Other

## 2022-01-19 ENCOUNTER — Emergency Department
Admission: EM | Admit: 2022-01-19 | Discharge: 2022-01-19 | Disposition: A | Discharge status: Home / Self Care | Payer: Medicaid Other | Attending: Emergency Medicine | Admitting: Emergency Medicine

## 2022-01-19 ENCOUNTER — Other Ambulatory Visit: Payer: Self-pay

## 2022-01-19 DIAGNOSIS — B0231 Zoster conjunctivitis: Secondary | ICD-10-CM | POA: Diagnosis not present

## 2022-01-19 DIAGNOSIS — Z992 Dependence on renal dialysis: Secondary | ICD-10-CM | POA: Insufficient documentation

## 2022-01-19 DIAGNOSIS — F17200 Nicotine dependence, unspecified, uncomplicated: Secondary | ICD-10-CM | POA: Insufficient documentation

## 2022-01-19 DIAGNOSIS — R059 Cough, unspecified: Secondary | ICD-10-CM | POA: Diagnosis present

## 2022-01-19 DIAGNOSIS — N186 End stage renal disease: Secondary | ICD-10-CM | POA: Insufficient documentation

## 2022-01-19 DIAGNOSIS — Z20822 Contact with and (suspected) exposure to covid-19: Secondary | ICD-10-CM | POA: Diagnosis not present

## 2022-01-19 DIAGNOSIS — Z21 Asymptomatic human immunodeficiency virus [HIV] infection status: Secondary | ICD-10-CM | POA: Diagnosis not present

## 2022-01-19 DIAGNOSIS — J069 Acute upper respiratory infection, unspecified: Secondary | ICD-10-CM | POA: Diagnosis not present

## 2022-01-19 DIAGNOSIS — R0602 Shortness of breath: Secondary | ICD-10-CM | POA: Insufficient documentation

## 2022-01-19 LAB — TROPONIN I (HIGH SENSITIVITY)
Troponin I (High Sensitivity): 57 ng/L — ABNORMAL HIGH (ref <18)
Troponin I (High Sensitivity): 59 ng/L — ABNORMAL HIGH (ref <18)

## 2022-01-19 LAB — BASIC METABOLIC PANEL
Anion gap: 13 (ref 5–15)
BUN: 25 mg/dL — ABNORMAL HIGH (ref 6–20)
CO2: 25 mmol/L (ref 22–32)
Calcium: 8.8 mg/dL — ABNORMAL LOW (ref 8.9–10.3)
Chloride: 99 mmol/L (ref 98–111)
Creatinine, Ser: 6.22 mg/dL — ABNORMAL HIGH (ref 0.44–1.00)
GFR, Estimated: 8 mL/min — ABNORMAL LOW (ref >60)
Glucose, Bld: 120 mg/dL — ABNORMAL HIGH (ref 70–99)
Potassium: 3 mmol/L — ABNORMAL LOW (ref 3.5–5.1)
Sodium: 137 mmol/L (ref 135–145)

## 2022-01-19 LAB — RESP PANEL BY RT-PCR (FLU A&B, COVID) ARPGX2
Influenza A by PCR: NEGATIVE
Influenza B by PCR: NEGATIVE
SARS Coronavirus 2 by RT PCR: NEGATIVE

## 2022-01-19 LAB — CBC
HCT: 33.7 % — ABNORMAL LOW (ref 36.0–46.0)
Hemoglobin: 10.3 g/dL — ABNORMAL LOW (ref 12.0–15.0)
MCH: 32.6 pg (ref 26.0–34.0)
MCHC: 30.6 g/dL (ref 30.0–36.0)
MCV: 106.6 fL — ABNORMAL HIGH (ref 80.0–100.0)
Platelets: 70 10*3/uL — ABNORMAL LOW (ref 150–400)
RBC: 3.16 MIL/uL — ABNORMAL LOW (ref 3.87–5.11)
RDW: 17.7 % — ABNORMAL HIGH (ref 11.5–15.5)
WBC: 3.3 10*3/uL — ABNORMAL LOW (ref 4.0–10.5)
nRBC: 0 % (ref 0.0–0.2)

## 2022-01-19 LAB — BRAIN NATRIURETIC PEPTIDE: B Natriuretic Peptide: >4500 pg/mL — ABNORMAL HIGH (ref 0.0–100.0)

## 2022-01-19 MED ORDER — VALACYCLOVIR HCL 500 MG PO TABS: 500.0000 mg | ORAL_TABLET | Freq: Three times a day (TID) | ORAL | 0 refills | Status: AC

## 2022-01-19 MED ORDER — LIDOCAINE-PRILOCAINE 2.5-2.5 % EX CREA: 1.0000 | TOPICAL_CREAM | CUTANEOUS | Status: DC | PRN

## 2022-01-19 MED ORDER — GABAPENTIN 100 MG PO CAPS: 100.0000 mg | ORAL_CAPSULE | Freq: Three times a day (TID) | ORAL | 0 refills | Status: DC | PRN

## 2022-01-19 MED ORDER — LIDOCAINE HCL (PF) 1 % IJ SOLN: 5.0000 mL | INTRAMUSCULAR | Status: DC | PRN

## 2022-01-19 MED ORDER — PENTAFLUOROPROP-TETRAFLUOROETH EX AERO: 1.0000 | INHALATION_SPRAY | CUTANEOUS | Status: DC | PRN

## 2022-01-19 MED ORDER — HEPARIN SODIUM (PORCINE) 1000 UNIT/ML DIALYSIS: 1000.0000 [IU] | INTRAMUSCULAR | Status: DC | PRN

## 2022-01-19 MED ORDER — ALTEPLASE 2 MG IJ SOLR: 2.0000 mg | Freq: Once | INTRAMUSCULAR | Status: DC | PRN

## 2022-01-19 MED ORDER — GABAPENTIN 100 MG PO CAPS
100.0000 mg | ORAL_CAPSULE | Freq: Once | ORAL | Status: AC
Start: 2022-01-19 — End: 2022-01-19
  Administered 2022-01-19: 100 mg via ORAL
  Filled 2022-01-19: qty 1

## 2022-01-19 MED ORDER — ANTICOAGULANT SODIUM CITRATE 4% (200MG/5ML) IV SOLN: 5.0000 mL | Status: DC | PRN

## 2022-01-19 NOTE — ED Provider Notes (Signed)
[facility] Provider Note   Event Date/Time   First MD Initiated Contact with Patient 01/19/22 [phone]     (approximate) History  Shortness of Breath  HPI Rebecca Bridges is a 46 y.o. female with a history of end-stage renal disease on dialysis Tuesday/Thursday/Saturday, HIV with latest CD4 count of 98 on 11/12/2021 who presents for the second time in 2 days with a productive cough, sore throat, and subjective after being around a grandson with similar symptoms.  Patient states that she is a smoker but denies any history of COPD.  Patient denies any altered mental status ROS: Patient currently denies any vision changes, tinnitus, difficulty speaking, facial droop, chest pain, abdominal pain, nausea/vomiting/diarrhea, dysuria, or weakness/numbness/paresthesias in any extremity   Physical Exam  Triage Vital Signs: ED Triage Vitals  Enc Vitals Group     BP 01/19/22 0650 (!) 137/100     Pulse Rate 01/19/22 0650 81     Resp 01/19/22 0650 20     Temp 01/19/22 0650 97.6 F (36.4 C)     Temp Source 01/19/22 0650 Oral     SpO2 01/19/22 0650 97 %     Weight 01/19/22 0651 119 lb (54 kg)     Height 01/19/22 0651 5\' 3"  (1.6 m)     Head Circumference --      Peak Flow --      Pain Score 01/19/22 0650 10     Pain Loc --      Pain Edu? --      Excl. in [place]? --    Most recent vital signs: Vitals:   01/19/22 0800 01/19/22 0830  BP: 134/84 (!) 133/91  Pulse: 72 77  Resp: (!) 25 (!) 24  Temp:    SpO2: 96% 95%   General: Awake, oriented x4. CV:  Good peripheral perfusion.  Resp:  Normal effort.  Abd:  No distention.  Other:  Middle-aged African-American female laying in bed in no acute distress.  Patient has painful maculopapular rash over the right forehead and periorbital region with conjunctival injection. ED Results / Procedures / Treatments  Labs (all labs ordered are listed, but only abnormal results are displayed) Labs Reviewed  BASIC METABOLIC PANEL - Abnormal;  Notable for the following components:      Result Value   Potassium 3.0 (*)    Glucose, Bld 120 (*)    BUN 25 (*)    Creatinine, Ser 6.22 (*)    Calcium 8.8 (*)    GFR, Estimated 8 (*)    All other components within normal limits  CBC - Abnormal; Notable for the following components:   WBC 3.3 (*)    RBC 3.16 (*)    Hemoglobin 10.3 (*)    HCT 33.7 (*)    MCV 106.6 (*)    RDW 17.7 (*)    Platelets 70 (*)    All other components within normal limits  BRAIN NATRIURETIC PEPTIDE - Abnormal; Notable for the following components:   B Natriuretic Peptide >4,500.0 (*)    All other components within normal limits  TROPONIN I (HIGH SENSITIVITY) - Abnormal; Notable for the following components:   Troponin I (High Sensitivity) 57 (*)    All other components within normal limits  TROPONIN I (HIGH SENSITIVITY) - Abnormal; Notable for the following components:   Troponin I (High Sensitivity) 59 (*)    All other components within normal limits  RESP PANEL BY RT-PCR (FLU A&B, COVID) ARPGX2  POC URINE PREG,  ED   EKG ED ECG REPORT I, Naaman Plummer, the attending physician, personally viewed and interpreted this ECG. Date: 01/19/2022 EKG Time: 0650 Rate: 79 Rhythm: normal sinus rhythm QRS Axis: normal Intervals: Right bundle branch block ST/T Wave abnormalities: normal Narrative Interpretation: Normal sinus rhythm with right bundle branch block.  No evidence of acute ischemia RADIOLOGY ED MD interpretation: 2 view chest x-ray interpreted by me and shows stable exam with small left base atelectasis and trace pleural effusion on the left.  No evidence of acute abnormalities -Agree with radiology assessment Official radiology report(s): DG Chest 2 View  Result Date: 01/19/2022 CLINICAL DATA:  Productive cough and shortness of breath. EXAM: CHEST - 2 VIEW COMPARISON:  01/17/2022 FINDINGS: The cardio pericardial silhouette is enlarged. Chronic atelectasis or scarring at the left base is similar  to prior. Trace left pleural effusion evident. No evidence for pulmonary edema or substantial pleural effusion. Right sided central line remains in place. IMPRESSION: Stable exam. Left base atelectasis or scarring with trace left effusion. Electronically Signed   By: Misty Stanley M.D.   On: 01/19/2022 07:21   PROCEDURES: Critical Care performed: No .1-3 Lead EKG Interpretation  Performed by: Naaman Plummer, MD Authorized by: Naaman Plummer, MD     Interpretation: normal     ECG rate:  78   ECG rate assessment: normal     Rhythm: sinus rhythm     Ectopy: none     Conduction: normal    MEDICATIONS ORDERED IN ED: Medications  pentafluoroprop-tetrafluoroeth (GEBAUERS) aerosol 1 Application (has no administration in time range)  lidocaine (PF) (XYLOCAINE) 1 % injection 5 mL (has no administration in time range)  lidocaine-prilocaine (EMLA) cream 1 Application (has no administration in time range)  heparin injection 1,000 Units (has no administration in time range)  anticoagulant sodium citrate solution 5 mL (has no administration in time range)  alteplase (CATHFLO ACTIVASE) injection 2 mg (has no administration in time range)  gabapentin (NEURONTIN) capsule 100 mg (100 mg Oral Given 01/19/22 0847)   IMPRESSION / MDM / ASSESSMENT AND PLAN / ED COURSE  I reviewed the triage vital signs and the nursing notes.                             The patient is on the cardiac monitor to evaluate for evidence of arrhythmia and/or significant heart rate changes. Patient's presentation is most consistent with acute presentation with potential threat to life or bodily function. Otherwise healthy patient presenting with constellation of symptoms likely representing uncomplicated viral upper respiratory symptoms as characterized by mild pharyngitis and cough  Unlikely PTA/RPA: no hot potato voice, no uvular deviation, Unlikely Esophageal rupture: No history of dysphagia Unlikely deep space  infection/[place]igs Low suspicion for CNS infection bacterial sinusitis, or pneumonia given exam and history.  Unlikely Strep or EBV as centor negative and with no pharyngeal exudate, posterior LAD, or splenomegaly. Patient also shows signs of shingles to the right face that she states has been recurrent over the last few weeks.  Patient states that she has been on antibiotics for this as well as medicated eye ointment Will attempt to alleviate symptoms conservatively; no overt indications at this time for antibiotics. No respiratory distress, otherwise relatively well appearing and nontoxic. Will discuss prompt follow up with PMD and strict return precautions.   FINAL CLINICAL IMPRESSION(S) / ED DIAGNOSES   Final diagnoses:  Upper respiratory tract infection, unspecified type  SOB (shortness of breath)  Herpes zoster conjunctivitis   Rx / DC Orders   ED Discharge Orders          Ordered    valACYclovir (VALTREX) 500 MG tablet  3 times daily        01/19/22 0938    gabapentin (NEURONTIN) 100 MG capsule  3 times daily PRN        01/19/22 4276           Note:  This document was prepared using Dragon voice recognition software and may include unintentional dictation errors.   Naaman Plummer, MD 01/19/22 [phone]

## 2022-01-19 NOTE — ED Notes (Signed)
Assumed care, pt in NAD states taking meds for shingles and has "cold" that is not getting any better. Was seen in ED earlier this week and reports was asked to stay the night but unable too. T Th S HD pt. Has not missed treatment. Will monitor.

## 2022-01-19 NOTE — ED Triage Notes (Signed)
Patient reports productive cough and shortness of breath.  States she was supposed to be admitted two days ago but left AMA. Patient also reports she began treatment for shingles to right eye 01/07/22, but states shingles rash to face has worsened. AOX4. Coughing during triage. Ambulatory.

## 2022-01-22 ENCOUNTER — Other Ambulatory Visit (INDEPENDENT_AMBULATORY_CARE_PROVIDER_SITE_OTHER): Payer: Self-pay | Admitting: Nurse Practitioner

## 2022-01-22 DIAGNOSIS — T829XXA Unspecified complication of cardiac and vascular prosthetic device, implant and graft, initial encounter: Secondary | ICD-10-CM

## 2022-01-22 DIAGNOSIS — I129 Hypertensive chronic kidney disease with stage 1 through stage 4 chronic kidney disease, or unspecified chronic kidney disease: Secondary | ICD-10-CM

## 2022-01-22 LAB — CULTURE, BLOOD (ROUTINE X 2)
Culture: NO GROWTH
Culture: NO GROWTH
Special Requests: ADEQUATE
Special Requests: ADEQUATE

## 2022-01-23 ENCOUNTER — Ambulatory Visit (INDEPENDENT_AMBULATORY_CARE_PROVIDER_SITE_OTHER): Payer: MEDICAID | Admitting: Nurse Practitioner

## 2022-01-23 ENCOUNTER — Encounter (INDEPENDENT_AMBULATORY_CARE_PROVIDER_SITE_OTHER): Payer: MEDICAID

## 2022-03-04 ENCOUNTER — Ambulatory Visit (INDEPENDENT_AMBULATORY_CARE_PROVIDER_SITE_OTHER): Payer: Medicaid Other | Admitting: Nurse Practitioner

## 2022-03-04 ENCOUNTER — Encounter (INDEPENDENT_AMBULATORY_CARE_PROVIDER_SITE_OTHER): Payer: Self-pay | Admitting: Nurse Practitioner

## 2022-03-04 ENCOUNTER — Ambulatory Visit (INDEPENDENT_AMBULATORY_CARE_PROVIDER_SITE_OTHER): Payer: Medicaid Other

## 2022-03-04 VITALS — BP 125/76 | HR 80 | Resp 16 | Wt 112.8 lb

## 2022-03-04 DIAGNOSIS — Z992 Dependence on renal dialysis: Secondary | ICD-10-CM

## 2022-03-04 DIAGNOSIS — I129 Hypertensive chronic kidney disease with stage 1 through stage 4 chronic kidney disease, or unspecified chronic kidney disease: Secondary | ICD-10-CM

## 2022-03-04 DIAGNOSIS — N184 Chronic kidney disease, stage 4 (severe): Secondary | ICD-10-CM

## 2022-03-04 DIAGNOSIS — T829XXA Unspecified complication of cardiac and vascular prosthetic device, implant and graft, initial encounter: Secondary | ICD-10-CM

## 2022-03-04 DIAGNOSIS — N186 End stage renal disease: Secondary | ICD-10-CM | POA: Diagnosis not present

## 2022-03-05 ENCOUNTER — Encounter (INDEPENDENT_AMBULATORY_CARE_PROVIDER_SITE_OTHER): Payer: Self-pay | Admitting: Nurse Practitioner

## 2022-03-05 NOTE — Progress Notes (Signed)
Subjective:    Patient ID: Rebecca Bridges, female    DOB: [birth year], 46 y.o.   MRN: [id number] Chief Complaint  Patient presents with   New Patient (Initial Visit)    Ref Candiss Norse consult poor access maturation     Rebecca Bridges is a 46 year old female who presents today as a referral from Dr. Candiss Norse in regards to dialysis access maturation.  The patient has a left radiocephalic AV fistula which is encountering issues.  The patient notes that they are not able to place 1 needle but are unable to place the second needle due to high arterial pressures.  She has had her access for approximately a year.  It was placed in [place].  She has used the access for about a total of 4 sessions.  It was cannulated both in the hospital and at the center and she had issues in the same area.  Based on this the patient has a flow volume 384.  No areas of focal stenosis.    Review of Systems  All other systems reviewed and are negative.      Objective:   Physical Exam Vitals reviewed.  HENT:     Head: Normocephalic.  Cardiovascular:     Rate and Rhythm: Normal rate.     Pulses:          Radial pulses are 1+ on the right side and 1+ on the left side.     Arteriovenous access: Left arteriovenous access is present.    Comments: Good thrill and bruit Pulmonary:     Effort: Pulmonary effort is normal.  Skin:    General: Skin is warm and dry.  Neurological:     Mental Status: She is alert and oriented to person, place, and time.  Psychiatric:        Mood and Affect: Mood normal.        Behavior: Behavior normal.        Thought Content: Thought content normal.        Judgment: Judgment normal.     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 L AT thromboembolecomty and PTA.   Cardiomyopathy ([place])    a. a. 07/2017 Echo: EF 45-50%,  Gr1 DD; b. 01/2019 Echo: EF 20-25%, mod dil LV. Impaired relaxation. Nl RV fxn. Mild to mod MR. Ao root 3.5cm.   CHF (congestive heart failure) (HCC)    CKD (chronic kidney disease), stage III (HCC)    HFrEF (heart failure with reduced ejection fraction) ([place])    a. 07/2017 Echo: EF 45-50%, Gr1 DD; b. 01/2019 Echo: EF 20-25%.   HIV (human immunodeficiency virus infection) ([place])    a.  Diagnosed in 2015   Hypertension    LBBB (left bundle branch block)    Pancytopenia (HCC)    Tobacco abuse     Social History   Socioeconomic History   Marital status: Single    Spouse name: Not on file   Number of children: Not on file   Years of education: Not on file   Highest education level: Not on file  Occupational History   Not on file  Tobacco Use   Smoking status: Every Day    Types: Cigarettes   Smokeless tobacco: Never  Tobacco comments:    5 cigarettes a day  Vaping Use   Vaping Use: Never used  Substance and Sexual Activity   Alcohol use: No   Drug use: No   Sexual activity: Not Currently  Other Topics Concern   Not on file  Social History Narrative   Not on file   Social Determinants of Health   Financial Resource Strain: Low Risk  (04/26/2019)   Overall Financial Resource Strain (CARDIA)    Difficulty of Paying Living Expenses: Not hard at all  Food Insecurity: No Food Insecurity (04/26/2019)   Hunger Vital Sign    Worried About Running Out of Food in the Last Year: Never true    Ran Out of Food in the Last Year: Never true  Transportation Needs: Unknown (04/26/2019)   PRAPARE - Hydrologist (Medical): Not on file    Lack of Transportation (Non-Medical): No  Physical Activity: Inactive (04/26/2019)   Exercise Vital Sign    Days of Exercise per Week: 0 days    Minutes of Exercise per Session: 0 min  Stress: No Stress Concern Present (04/26/2019)   [place]    Feeling of  Stress : Not at all  Social Connections: Moderately Isolated (04/26/2019)   Social Connection and Isolation Panel [NHANES]    Frequency of Communication with Friends and Family: More than three times a week    Frequency of Social Gatherings with Friends and Family: More than three times a week    Attends Religious Services: 1 to 4 times per year    Active Member of [employer] or Organizations: No    Attends Archivist Meetings: Never    Marital Status: Never married  Intimate Partner Violence: Not At Risk (04/26/2019)   Humiliation, Afraid, Rape, and Kick questionnaire    Fear of Current or Ex-Partner: No    Emotionally Abused: No    Physically Abused: No    Sexually Abused: No    Past Surgical History:  Procedure Laterality Date   CHOLECYSTECTOMY     LOWER EXTREMITY ANGIOGRAPHY Left 01/25/2019   Procedure: Lower Extremity Angiography;  Surgeon: Katha Cabal, MD;  Location: [facility];  Service: Cardiovascular;  Laterality: Left;   RIGHT/LEFT HEART CATH AND CORONARY ANGIOGRAPHY Bilateral 02/15/2019   Procedure: RIGHT/LEFT HEART CATH AND CORONARY ANGIOGRAPHY;  Surgeon: Minna Merritts, MD;  Location: [facility];  Service: Cardiovascular;  Laterality: Bilateral;    Family History  Problem Relation Age of Onset   Hypertension Mother    Cerebral aneurysm Mother     Allergies  Allergen Reactions   Neosporin [Bacitracin-Polymyxin B] Rash   Sulfa Antibiotics Rash       Latest Ref Rng & Units 01/19/2022    6:51 AM 01/17/2022    8:51 AM 09/24/2021    9:50 AM  CBC  WBC 4.0 - 10.5 K/uL 3.3  2.7  2.6   Hemoglobin 12.0 - 15.0 g/dL 10.3  10.3  11.6   Hematocrit 36.0 - 46.0 % 33.7  33.5  39.4   Platelets 150 - 400 K/uL 70  71  74       CMP     Component Value Date/Time   NA 137 01/19/2022 0651   NA 132 (L) 03/06/2019 1418   K 3.0 (L) 01/19/2022 0651   CL 99 01/19/2022 0651   CO2 25 01/19/2022 0651   GLUCOSE 120 (H) 01/19/2022 8466  BUN 25 (H)  01/19/2022 0651   BUN 41 (H) 03/06/2019 1418   CREATININE 6.22 (H) 01/19/2022 0651   CALCIUM 8.8 (L) 01/19/2022 0651   PROT 9.1 (H) 01/17/2022 0851   ALBUMIN 3.5 01/17/2022 0851   AST 30 01/17/2022 0851   ALT 12 01/17/2022 0851   ALKPHOS 71 01/17/2022 0851   BILITOT 1.5 (H) 01/17/2022 0851   GFRNONAA 8 (L) 01/19/2022 0651   GFRAA 16 (L) 06/13/2019 1150     No results found.     Assessment & Plan:   1. ESRD on dialysis [facility]) Recommend:  The patient is experiencing increasing problems with their dialysis access.  Patient should have a fistulagram with the intention for intervention.  The intention for intervention is to restore appropriate flow and prevent thrombosis and possible loss of the access.  As well as improve the quality of dialysis therapy.  The risks, benefits and alternative therapies were reviewed in detail with the patient.  All questions were answered.  The patient agrees to proceed with angio/intervention.    The patient will follow up with me in the office after the procedure.     Current Outpatient Medications on File Prior to Visit  Medication Sig Dispense Refill   B Complex-C-Folic Acid (RENA-VITE RX) 1 MG TABS Take 1 tablet by mouth daily.     bictegravir-emtricitabine-tenofovir AF (BIKTARVY) 50-200-25 MG TABS tablet Take 1 tablet by mouth daily.     calcium acetate (PHOSLO) 667 MG capsule Take 2,001 mg by mouth 3 (three) times daily.     carvedilol (COREG) 12.5 MG tablet Take 12.5 mg by mouth 2 (two) times daily with a meal.     dapsone 100 MG tablet Take 100 mg by mouth daily.     Multiple Vitamin (MULTIVITAMIN WITH MINERALS) TABS tablet Take 1 tablet by mouth daily.     pantoprazole (PROTONIX) 40 MG tablet Take 40 mg by mouth every 12 (twelve) hours as needed.     prednisoLONE acetate (PRED FORTE) 1 % ophthalmic suspension Place 1 drop into the right eye at bedtime.     simethicone (MYLICON) 80 MG chewable tablet Chew 160 mg by mouth every 6 (six)  hours as needed for flatulence.     warfarin (COUMADIN) 2 MG tablet Take 2 mg by mouth at bedtime.     apixaban (ELIQUIS) 5 MG TABS tablet Take 1 tablet (5 mg total) by mouth 2 (two) times daily. (Patient not taking: Reported on 01/17/2022) 60 tablet 1   carvedilol (COREG) 25 MG tablet Take 1 tablet (25 mg total) by mouth 2 (two) times daily with a meal. (Patient not taking: Reported on 09/24/2021) 60 tablet 3   furosemide (LASIX) 40 MG tablet Take 1 tablet (40 mg total) by mouth daily. (Patient taking differently: Take 40 mg by mouth 2 (two) times daily.) 30 tablet 0   gabapentin (NEURONTIN) 100 MG capsule Take 1 capsule (100 mg total) by mouth 3 (three) times daily as needed for up to 10 days (facial pain). 30 capsule 0   pantoprazole (PROTONIX) 40 MG tablet Take 1 tablet (40 mg total) by mouth daily. 30 tablet 0   No current facility-administered medications on file prior to visit.    There are no Patient Instructions on file for this visit. No follow-ups on file.   Kris Hartmann, NP

## 2022-03-05 NOTE — H&P (View-Only) (Signed)
Subjective:    Patient ID: Rebecca Bridges, female    DOB: 05-Aug-1975, 46 y.o.   MRN: [id number] Chief Complaint  Patient presents with   New Patient (Initial Visit)    Ref Candiss Norse consult poor access maturation     Rebecca Bridges is a 46 year old female who presents today as a referral from Dr. Candiss Norse in regards to dialysis access maturation.  The patient has a left radiocephalic AV fistula which is encountering issues.  The patient notes that they are not able to place 1 needle but are unable to place the second needle due to high arterial pressures.  She has had her access for approximately a year.  It was placed in [place].  She has used the access for about a total of 4 sessions.  It was cannulated both in the hospital and at the center and she had issues in the same area.  Based on this the patient has a flow volume 384.  No areas of focal stenosis.    Review of Systems  All other systems reviewed and are negative.      Objective:   Physical Exam Vitals reviewed.  HENT:     Head: Normocephalic.  Cardiovascular:     Rate and Rhythm: Normal rate.     Pulses:          Radial pulses are 1+ on the right side and 1+ on the left side.     Arteriovenous access: Left arteriovenous access is present.    Comments: Good thrill and bruit Pulmonary:     Effort: Pulmonary effort is normal.  Skin:    General: Skin is warm and dry.  Neurological:     Mental Status: She is alert and oriented to person, place, and time.  Psychiatric:        Mood and Affect: Mood normal.        Behavior: Behavior normal.        Thought Content: Thought content normal.        Judgment: Judgment normal.     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 L AT thromboembolecomty and PTA.   Cardiomyopathy ([place])    a. a. 07/2017 Echo: EF 45-50%,  Gr1 DD; b. 01/2019 Echo: EF 20-25%, mod dil LV. Impaired relaxation. Nl RV fxn. Mild to mod MR. Ao root 3.5cm.   CHF (congestive heart failure) (HCC)    CKD (chronic kidney disease), stage III (HCC)    HFrEF (heart failure with reduced ejection fraction) ([place])    a. 07/2017 Echo: EF 45-50%, Gr1 DD; b. 01/2019 Echo: EF 20-25%.   HIV (human immunodeficiency virus infection) ([place])    a.  Diagnosed in 2015   Hypertension    LBBB (left bundle branch block)    Pancytopenia (HCC)    Tobacco abuse     Social History   Socioeconomic History   Marital status: Single    Spouse name: Not on file   Number of children: Not on file   Years of education: Not on file   Highest education level: Not on file  Occupational History   Not on file  Tobacco Use   Smoking status: Every Day    Types: Cigarettes   Smokeless tobacco: Never  Tobacco comments:    5 cigarettes a day  Vaping Use   Vaping Use: Never used  Substance and Sexual Activity   Alcohol use: No   Drug use: No   Sexual activity: Not Currently  Other Topics Concern   Not on file  Social History Narrative   Not on file   Social Determinants of Health   Financial Resource Strain: Low Risk  (04/26/2019)   Overall Financial Resource Strain (CARDIA)    Difficulty of Paying Living Expenses: Not hard at all  Food Insecurity: No Food Insecurity (04/26/2019)   Hunger Vital Sign    Worried About Running Out of Food in the Last Year: Never true    Ran Out of Food in the Last Year: Never true  Transportation Needs: Unknown (04/26/2019)   PRAPARE - Hydrologist (Medical): Not on file    Lack of Transportation (Non-Medical): No  Physical Activity: Inactive (04/26/2019)   Exercise Vital Sign    Days of Exercise per Week: 0 days    Minutes of Exercise per Session: 0 min  Stress: No Stress Concern Present (04/26/2019)   [place]    Feeling of  Stress : Not at all  Social Connections: Moderately Isolated (04/26/2019)   Social Connection and Isolation Panel [NHANES]    Frequency of Communication with Friends and Family: More than three times a week    Frequency of Social Gatherings with Friends and Family: More than three times a week    Attends Religious Services: 1 to 4 times per year    Active Member of [employer] or Organizations: No    Attends Archivist Meetings: Never    Marital Status: Never married  Intimate Partner Violence: Not At Risk (04/26/2019)   Humiliation, Afraid, Rape, and Kick questionnaire    Fear of Current or Ex-Partner: No    Emotionally Abused: No    Physically Abused: No    Sexually Abused: No    Past Surgical History:  Procedure Laterality Date   CHOLECYSTECTOMY     LOWER EXTREMITY ANGIOGRAPHY Left 01/25/2019   Procedure: Lower Extremity Angiography;  Surgeon: Katha Cabal, MD;  Location: [facility];  Service: Cardiovascular;  Laterality: Left;   RIGHT/LEFT HEART CATH AND CORONARY ANGIOGRAPHY Bilateral 02/15/2019   Procedure: RIGHT/LEFT HEART CATH AND CORONARY ANGIOGRAPHY;  Surgeon: Minna Merritts, MD;  Location: [facility];  Service: Cardiovascular;  Laterality: Bilateral;    Family History  Problem Relation Age of Onset   Hypertension Mother    Cerebral aneurysm Mother     Allergies  Allergen Reactions   Neosporin [Bacitracin-Polymyxin B] Rash   Sulfa Antibiotics Rash       Latest Ref Rng & Units 01/19/2022    6:51 AM 01/17/2022    8:51 AM 09/24/2021    9:50 AM  CBC  WBC 4.0 - 10.5 K/uL 3.3  2.7  2.6   Hemoglobin 12.0 - 15.0 g/dL 10.3  10.3  11.6   Hematocrit 36.0 - 46.0 % 33.7  33.5  39.4   Platelets 150 - 400 K/uL 70  71  74       CMP     Component Value Date/Time   NA 137 01/19/2022 0651   NA 132 (L) 03/06/2019 1418   K 3.0 (L) 01/19/2022 0651   CL 99 01/19/2022 0651   CO2 25 01/19/2022 0651   GLUCOSE 120 (H) 01/19/2022 3419  BUN 25 (H)  01/19/2022 0651   BUN 41 (H) 03/06/2019 1418   CREATININE 6.22 (H) 01/19/2022 0651   CALCIUM 8.8 (L) 01/19/2022 0651   PROT 9.1 (H) 01/17/2022 0851   ALBUMIN 3.5 01/17/2022 0851   AST 30 01/17/2022 0851   ALT 12 01/17/2022 0851   ALKPHOS 71 01/17/2022 0851   BILITOT 1.5 (H) 01/17/2022 0851   GFRNONAA 8 (L) 01/19/2022 0651   GFRAA 16 (L) 06/13/2019 1150     No results found.     Assessment & Plan:   1. ESRD on dialysis [facility]) Recommend:  The patient is experiencing increasing problems with their dialysis access.  Patient should have a fistulagram with the intention for intervention.  The intention for intervention is to restore appropriate flow and prevent thrombosis and possible loss of the access.  As well as improve the quality of dialysis therapy.  The risks, benefits and alternative therapies were reviewed in detail with the patient.  All questions were answered.  The patient agrees to proceed with angio/intervention.    The patient will follow up with me in the office after the procedure.     Current Outpatient Medications on File Prior to Visit  Medication Sig Dispense Refill   B Complex-C-Folic Acid (RENA-VITE RX) 1 MG TABS Take 1 tablet by mouth daily.     bictegravir-emtricitabine-tenofovir AF (BIKTARVY) 50-200-25 MG TABS tablet Take 1 tablet by mouth daily.     calcium acetate (PHOSLO) 667 MG capsule Take 2,001 mg by mouth 3 (three) times daily.     carvedilol (COREG) 12.5 MG tablet Take 12.5 mg by mouth 2 (two) times daily with a meal.     dapsone 100 MG tablet Take 100 mg by mouth daily.     Multiple Vitamin (MULTIVITAMIN WITH MINERALS) TABS tablet Take 1 tablet by mouth daily.     pantoprazole (PROTONIX) 40 MG tablet Take 40 mg by mouth every 12 (twelve) hours as needed.     prednisoLONE acetate (PRED FORTE) 1 % ophthalmic suspension Place 1 drop into the right eye at bedtime.     simethicone (MYLICON) 80 MG chewable tablet Chew 160 mg by mouth every 6 (six)  hours as needed for flatulence.     warfarin (COUMADIN) 2 MG tablet Take 2 mg by mouth at bedtime.     apixaban (ELIQUIS) 5 MG TABS tablet Take 1 tablet (5 mg total) by mouth 2 (two) times daily. (Patient not taking: Reported on 01/17/2022) 60 tablet 1   carvedilol (COREG) 25 MG tablet Take 1 tablet (25 mg total) by mouth 2 (two) times daily with a meal. (Patient not taking: Reported on 09/24/2021) 60 tablet 3   furosemide (LASIX) 40 MG tablet Take 1 tablet (40 mg total) by mouth daily. (Patient taking differently: Take 40 mg by mouth 2 (two) times daily.) 30 tablet 0   gabapentin (NEURONTIN) 100 MG capsule Take 1 capsule (100 mg total) by mouth 3 (three) times daily as needed for up to 10 days (facial pain). 30 capsule 0   pantoprazole (PROTONIX) 40 MG tablet Take 1 tablet (40 mg total) by mouth daily. 30 tablet 0   No current facility-administered medications on file prior to visit.    There are no Patient Instructions on file for this visit. No follow-ups on file.   Kris Hartmann, NP

## 2022-03-07 ENCOUNTER — Other Ambulatory Visit
Admission: RE | Admit: 2022-03-07 | Discharge: 2022-03-07 | Disposition: A | Discharge status: Home / Self Care | Payer: Medicaid Other | Source: Ambulatory Visit | Attending: Internal Medicine | Admitting: Internal Medicine

## 2022-03-07 DIAGNOSIS — N186 End stage renal disease: Secondary | ICD-10-CM | POA: Insufficient documentation

## 2022-03-07 LAB — PROTIME-INR
INR: >10 (ref 0.8–1.2)
Prothrombin Time: >90 seconds — ABNORMAL HIGH (ref 11.4–15.2)

## 2022-03-08 ENCOUNTER — Other Ambulatory Visit: Payer: Self-pay | Admitting: Internal Medicine

## 2022-03-08 DIAGNOSIS — Z992 Dependence on renal dialysis: Secondary | ICD-10-CM

## 2022-03-08 DIAGNOSIS — I2699 Other pulmonary embolism without acute cor pulmonale: Secondary | ICD-10-CM

## 2022-03-08 NOTE — Progress Notes (Signed)
Unable to get accurate PT/INR results for CVC. Will have patient get monthly PT/INR at [facility] via peripheral stick.

## 2022-03-10 ENCOUNTER — Ambulatory Visit (INDEPENDENT_AMBULATORY_CARE_PROVIDER_SITE_OTHER): Payer: MEDICAID | Admitting: Nurse Practitioner

## 2022-03-10 ENCOUNTER — Other Ambulatory Visit
Admission: RE | Admit: 2022-03-10 | Discharge: 2022-03-10 | Disposition: A | Discharge status: Home / Self Care | Payer: Medicaid Other | Source: Ambulatory Visit | Attending: Nephrology | Admitting: Nephrology

## 2022-03-10 DIAGNOSIS — I129 Hypertensive chronic kidney disease with stage 1 through stage 4 chronic kidney disease, or unspecified chronic kidney disease: Secondary | ICD-10-CM | POA: Insufficient documentation

## 2022-03-10 LAB — PROTIME-INR
INR: 1.6 — ABNORMAL HIGH (ref 0.8–1.2)
Prothrombin Time: 19 seconds — ABNORMAL HIGH (ref 11.4–15.2)

## 2022-03-20 ENCOUNTER — Telehealth (INDEPENDENT_AMBULATORY_CARE_PROVIDER_SITE_OTHER): Payer: Self-pay

## 2022-03-20 NOTE — Telephone Encounter (Signed)
I attempted to contact the patient to schedule a LUE angio with Dr. Delana Meyer. A  message was left for a return call.

## 2022-03-24 ENCOUNTER — Telehealth (INDEPENDENT_AMBULATORY_CARE_PROVIDER_SITE_OTHER): Payer: Self-pay

## 2022-03-24 NOTE — Telephone Encounter (Signed)
I attempted to contact the patient to schedule a LUE fistulagram with Dr. Delana Meyer. A message was left for a return call.

## 2022-03-24 NOTE — Telephone Encounter (Signed)
Patient called back and she is scheduled with Dr. Delana Meyer for a LUE fistulagram on 03/31/22 with a 2:00 pm arrival time to the MM. Pre-procedure instructions were discussed and will be mailed.

## 2022-03-31 ENCOUNTER — Other Ambulatory Visit: Payer: Self-pay

## 2022-03-31 ENCOUNTER — Encounter
Admission: RE | Disposition: A | Discharge status: Home / Self Care | Payer: Self-pay | Source: Home / Self Care | Attending: Vascular Surgery

## 2022-03-31 ENCOUNTER — Ambulatory Visit
Admission: RE | Admit: 2022-03-31 | Discharge: 2022-03-31 | Disposition: A | Discharge status: Home / Self Care | Payer: Medicare Other | Attending: Vascular Surgery | Admitting: Vascular Surgery

## 2022-03-31 DIAGNOSIS — T82858A Stenosis of vascular prosthetic devices, implants and grafts, initial encounter: Secondary | ICD-10-CM | POA: Diagnosis present

## 2022-03-31 DIAGNOSIS — N186 End stage renal disease: Secondary | ICD-10-CM | POA: Diagnosis not present

## 2022-03-31 DIAGNOSIS — Z992 Dependence on renal dialysis: Secondary | ICD-10-CM | POA: Insufficient documentation

## 2022-03-31 DIAGNOSIS — Y841 Kidney dialysis as the cause of abnormal reaction of the patient, or of later complication, without mention of misadventure at the time of the procedure: Secondary | ICD-10-CM | POA: Diagnosis not present

## 2022-03-31 HISTORY — PX: A/V FISTULAGRAM: CATH118298

## 2022-03-31 LAB — POTASSIUM (ARMC VASCULAR LAB ONLY): Potassium (ARMC vascular lab): 4.3 mmol/L (ref 3.5–5.1)

## 2022-03-31 SURGERY — A/V FISTULAGRAM
Anesthesia: Moderate Sedation | Laterality: Left

## 2022-03-31 MED ORDER — IODIXANOL 320 MG/ML IV SOLN
INTRAVENOUS | Status: DC | PRN
  Administered 2022-03-31: 20 mL

## 2022-03-31 MED ORDER — HEPARIN SODIUM (PORCINE) 1000 UNIT/ML IJ SOLN
INTRAMUSCULAR | Status: AC
  Filled 2022-03-31: qty 10

## 2022-03-31 MED ORDER — CEFAZOLIN SODIUM-DEXTROSE 1-4 GM/50ML-% IV SOLN
INTRAVENOUS | Status: AC
  Filled 2022-03-31: qty 50

## 2022-03-31 MED ORDER — SODIUM CHLORIDE 0.9 % IV SOLN: INTRAVENOUS | Status: DC

## 2022-03-31 MED ORDER — FAMOTIDINE 20 MG PO TABS: 40.0000 mg | ORAL_TABLET | Freq: Once | ORAL | Status: DC | PRN

## 2022-03-31 MED ORDER — MIDAZOLAM HCL 5 MG/5ML IJ SOLN
INTRAMUSCULAR | Status: AC
  Filled 2022-03-31: qty 5

## 2022-03-31 MED ORDER — DIPHENHYDRAMINE HCL 50 MG/ML IJ SOLN: 50.0000 mg | Freq: Once | INTRAMUSCULAR | Status: DC | PRN

## 2022-03-31 MED ORDER — CEFAZOLIN SODIUM-DEXTROSE 1-4 GM/50ML-% IV SOLN
INTRAVENOUS | Status: DC | PRN
  Administered 2022-03-31: 1 g via INTRAVENOUS

## 2022-03-31 MED ORDER — METHYLPREDNISOLONE SODIUM SUCC 125 MG IJ SOLR: 125.0000 mg | Freq: Once | INTRAMUSCULAR | Status: DC | PRN

## 2022-03-31 MED ORDER — FENTANYL CITRATE (PF) 100 MCG/2ML IJ SOLN
INTRAMUSCULAR | Status: DC | PRN
  Administered 2022-03-31: 25 ug via INTRAVENOUS
  Administered 2022-03-31: 50 ug via INTRAVENOUS

## 2022-03-31 MED ORDER — HYDROMORPHONE HCL 1 MG/ML IJ SOLN: 1.0000 mg | Freq: Once | INTRAMUSCULAR | Status: DC | PRN

## 2022-03-31 MED ORDER — FENTANYL CITRATE (PF) 100 MCG/2ML IJ SOLN
INTRAMUSCULAR | Status: AC
  Filled 2022-03-31: qty 2

## 2022-03-31 MED ORDER — MIDAZOLAM HCL 2 MG/2ML IJ SOLN
INTRAMUSCULAR | Status: DC | PRN
  Administered 2022-03-31: 2 mg via INTRAVENOUS

## 2022-03-31 MED ORDER — CEFAZOLIN SODIUM-DEXTROSE 1-4 GM/50ML-% IV SOLN: 1.0000 g | INTRAVENOUS | Status: DC

## 2022-03-31 MED ORDER — MIDAZOLAM HCL 2 MG/ML PO SYRP: 8.0000 mg | ORAL_SOLUTION | Freq: Once | ORAL | Status: DC | PRN

## 2022-03-31 SURGICAL SUPPLY — 15 items
CATH BEACON 5 .035 40 KMP TP (CATHETERS) IMPLANT
CATH BEACON 5 .038 40 KMP TP (CATHETERS) ×1
CATH MICROCATH PRGRT 2.8F 110 (CATHETERS) IMPLANT
DEVICE TORQUE (MISCELLANEOUS) IMPLANT
DRAPE BRACHIAL (DRAPES) IMPLANT
GLIDECATH 4FR STR (CATHETERS) IMPLANT
GUIDEWIRE ADV .018X180CM (WIRE) IMPLANT
GUIDEWIRE ANGLED .035 180CM (WIRE) IMPLANT
MICROCATH PROGREAT 2.8F 110 CM (CATHETERS) ×1
NDL ENTRY 21GA 7CM ECHOTIP (NEEDLE) IMPLANT
NEEDLE ENTRY 21GA 7CM ECHOTIP (NEEDLE) ×1 IMPLANT
PACK ANGIOGRAPHY (CUSTOM PROCEDURE TRAY) ×1 IMPLANT
SET INTRO CAPELLA COAXIAL (SET/KITS/TRAYS/PACK) IMPLANT
SHEATH BRITE TIP 6FRX5.5 (SHEATH) IMPLANT
SUT MNCRL AB 4-0 PS2 18 (SUTURE) IMPLANT

## 2022-03-31 NOTE — Interval H&P Note (Signed)
History and Physical Interval Note:  03/31/2022 2:14 PM  Rebecca Bridges  has presented today for surgery, with the diagnosis of LT Upper Extremity Fistulagram   ESRD.  The various methods of treatment have been discussed with the patient and family. After consideration of risks, benefits and other options for treatment, the patient has consented to  Procedure(s): A/V Fistulagram (Left) as a surgical intervention.  The patient's history has been reviewed, patient examined, no change in status, stable for surgery.  I have reviewed the patient's chart and labs.  Questions were answered to the patient's satisfaction.     Hortencia Pilar

## 2022-03-31 NOTE — Op Note (Signed)
OPERATIVE NOTE   PROCEDURE: Contrast injection left radiocephalic fistula. Proximal left radial arteriography from catheter placement substantially into radial artery for arteriography  PRE-OPERATIVE DIAGNOSIS: Complication of dialysis access                                                       End Stage Renal Disease  POST-OPERATIVE DIAGNOSIS: same as above   SURGEON: Katha Cabal, M.D.  ANESTHESIA: Conscious sedation was administered under my direct supervision by the interventional radiology RN.  IV Versed plus fentanyl were utilized. Continuous ECG, pulse oximetry and blood pressure was monitored throughout the entire procedure.  Conscious sedation was for a total of 37 minutes and 27 seconds minutes.  ESTIMATED BLOOD LOSS: minimal  FINDING(S): Long segment of greater than 90% stenosis from the arteriovenous anastomosis extending into the cephalic vein  SPECIMEN(S):  None  CONTRAST: 20 cc  FLUOROSCOPY TIME: 9.1 minutes  INDICATIONS: Rebecca Bridges is a 46 y.o. female who  presents with malfunctioning left wrist AV access.  The patient is scheduled for angiography with possible intervention of the AV access to prevent loss of the permanent access.  The patient is aware the risks include but are not limited to: bleeding, infection, thrombosis of the cannulated access, and possible anaphylactic reaction to the contrast.  The patient acknowledges if the access can not be salvaged a tunneled catheter will be needed and will be placed during this procedure.  The patient is aware of the risks of the procedure and elects to proceed with the angiogram and intervention.  DESCRIPTION: After full informed written consent was obtained, the patient was brought back to the Special Procedure suite and placed supine position.  Appropriate cardiopulmonary monitors were placed.  The left arm was prepped and draped in the standard fashion.  Appropriate timeout is called.   The left AV fistula was  accessed in a retrograde direction at the level of the antecubital fossa.  The access was cannulated with a micropuncture needle under ultrasound guidence.  Ultrasound was used to evaluate the the radiocephalic access.  It was echolucent and compressible indicating it is patent .  An ultrasound image was acquired for the permanent record.  A micropuncture needle was used to access the left wrist radiocephalic access under direct ultrasound guidance.  The microwire was then advanced under fluoroscopic guidance without difficulty followed by the micro-sheath.  The J-wire was then advanced and a 6 Fr sheath inserted.  Hand injections were completed to image the access from the arterial anastomosis through the entire access.  The central venous structures were also imaged by hand injections.  Based on the images, the radial artery is patent the palmar arch appears discontinuous with the radial artery.  The actual anastomosis appears patent but within 2 to 3 mm the vein becomes quite sclerotic and there is essentially greater than 90% stenosis over the next 2 to 3 cm.  At the level of a large dorsal branch of the cephalic vein becomes 6 to 7 mm in diameter and remains 6 to 7 mm all the way up to the antecubital fossa where both the basilic and the cephalic veins in the upper arm are partially matured.  This is not a situation that is amenable to intervention and will require revision.    A 4-0 Monocryl purse-string suture  was sewn around the sheath.  The sheath was removed and light pressure was applied.  A sterile bandage was applied to the puncture site.    COMPLICATIONS: None  CONDITION: Rebecca Bridges, M.D [facility] Office: [phone]  03/31/2022 3:52 PM

## 2022-04-01 ENCOUNTER — Encounter: Payer: Self-pay | Admitting: Vascular Surgery

## 2022-04-13 ENCOUNTER — Encounter (INDEPENDENT_AMBULATORY_CARE_PROVIDER_SITE_OTHER): Payer: Self-pay

## 2022-04-16 ENCOUNTER — Encounter (INDEPENDENT_AMBULATORY_CARE_PROVIDER_SITE_OTHER): Payer: Self-pay | Admitting: Vascular Surgery

## 2022-04-16 ENCOUNTER — Ambulatory Visit (INDEPENDENT_AMBULATORY_CARE_PROVIDER_SITE_OTHER): Payer: Medicare Other | Admitting: Vascular Surgery

## 2022-04-16 VITALS — BP 135/87 | HR 78 | Resp 18 | Ht 63.0 in | Wt 108.8 lb

## 2022-04-16 DIAGNOSIS — N186 End stage renal disease: Secondary | ICD-10-CM | POA: Diagnosis not present

## 2022-04-16 DIAGNOSIS — I10 Essential (primary) hypertension: Secondary | ICD-10-CM | POA: Diagnosis not present

## 2022-04-16 DIAGNOSIS — Z992 Dependence on renal dialysis: Secondary | ICD-10-CM

## 2022-04-16 DIAGNOSIS — I743 Embolism and thrombosis of arteries of the lower extremities: Secondary | ICD-10-CM

## 2022-04-16 DIAGNOSIS — I5023 Acute on chronic systolic (congestive) heart failure: Secondary | ICD-10-CM

## 2022-04-16 DIAGNOSIS — J449 Chronic obstructive pulmonary disease, unspecified: Secondary | ICD-10-CM

## 2022-04-16 NOTE — Progress Notes (Unsigned)
MRN : [id number]  Rebecca Bridges is a 46 y.o. ([birth year]) female who presents with chief complaint of check access.  History of Present Illness:   The patient is seen for evaluation of dialysis access.    Procedure 03/31/2022: Fistulogram without intervention Based on the images, the radial artery is patent the palmar arch appears discontinuous with the radial artery.  The actual anastomosis appears patent but within 2 to 3 mm the vein becomes quite sclerotic and there is essentially greater than 90% stenosis over the next 2 to 3 cm.  At the level of a large dorsal branch of the cephalic vein becomes 6 to 7 mm in diameter and remains 6 to 7 mm all the way up to the antecubital fossa where both the basilic and the cephalic veins in the upper arm are partially matured.  This is not a situation that is amenable to intervention and will require revision.     Current access is via a catheter which is functioning adaquately.  There have not been multiple episodes of catheter infection.  The patient denies fever and chills while on dialysis.  No tenderness or drainage at the exit site.  No recent shortening of the patient's walking distance or new symptoms consistent with claudication.  No history of rest pain symptoms. No new ulcers or wounds of the lower extremities have occurred.  The patient denies amaurosis fugax or recent TIA symptoms. There are no recent neurological changes noted. There is no history of DVT, PE or superficial thrombophlebitis. No recent episodes of angina or shortness of breath documented.      No outpatient medications have been marked as taking for the 04/16/22 encounter (Appointment) with Delana Meyer, Dolores Lory, MD.    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 L AT thromboembolecomty and PTA.   Cardiomyopathy ([place])    a. a. 07/2017 Echo: EF 45-50%, Gr1 DD; b. 01/2019 Echo: EF 20-25%, mod dil LV.  Impaired relaxation. Nl RV fxn. Mild to mod MR. Ao root 3.5cm.   CHF (congestive heart failure) (HCC)    CKD (chronic kidney disease), stage III (HCC)    HFrEF (heart failure with reduced ejection fraction) ([place])    a. 07/2017 Echo: EF 45-50%, Gr1 DD; b. 01/2019 Echo: EF 20-25%.   HIV (human immunodeficiency virus infection) ([place])    a.  Diagnosed in 2015   Hypertension    LBBB (left bundle branch block)    Pancytopenia (HCC)    Tobacco abuse     Past Surgical History:  Procedure Laterality Date   A/V FISTULAGRAM Left 03/31/2022   Procedure: A/V Fistulagram;  Surgeon: Katha Cabal, MD;  Location: [facility];  Service: Cardiovascular;  Laterality: Left;   CHOLECYSTECTOMY     LOWER EXTREMITY ANGIOGRAPHY Left 01/25/2019   Procedure: Lower Extremity Angiography;  Surgeon: Katha Cabal, MD;  Location: [facility];  Service: Cardiovascular;  Laterality: Left;   RIGHT/LEFT HEART CATH AND CORONARY ANGIOGRAPHY Bilateral 02/15/2019   Procedure: RIGHT/LEFT HEART CATH AND CORONARY ANGIOGRAPHY;  Surgeon: Minna Merritts, MD;  Location: [facility];  Service: Cardiovascular;  Laterality: Bilateral;    Social History Social History   Tobacco Use   Smoking status: Every Day    Types: Cigarettes   Smokeless tobacco: Never  Tobacco comments:    5 cigarettes a day  Vaping Use   Vaping Use: Never used  Substance Use Topics   Alcohol use: No   Drug use: No    Family History Family History  Problem Relation Age of Onset   Hypertension Mother    Cerebral aneurysm Mother     Allergies  Allergen Reactions   Neosporin [Bacitracin-Polymyxin B] Rash   Sulfa Antibiotics Rash     REVIEW OF SYSTEMS (Negative unless checked)  Constitutional: [] Weight loss  [] Fever  [] Chills Cardiac: [] Chest pain   [] Chest pressure   [] Palpitations   [] Shortness of breath when laying flat   [] Shortness of breath with exertion. Vascular:  [] Pain in legs with walking    [] Pain in legs at rest  [] History of DVT   [] Phlebitis   [] Swelling in legs   [] Varicose veins   [] Non-healing ulcers Pulmonary:   [] Uses home oxygen   [] Productive cough   [] Hemoptysis   [] Wheeze  [] COPD   [] Asthma Neurologic:  [] Dizziness   [] Seizures   [] History of stroke   [] History of TIA  [] Aphasia   [] Vissual changes   [] Weakness or numbness in arm   [] Weakness or numbness in leg Musculoskeletal:   [] Joint swelling   [] Joint pain   [] Low back pain Hematologic:  [] Easy bruising  [] Easy bleeding   [] Hypercoagulable state   [] Anemic Gastrointestinal:  [] Diarrhea   [] Vomiting  [] Gastroesophageal reflux/heartburn   [] Difficulty swallowing. Genitourinary:  [x] Chronic kidney disease   [] Difficult urination  [] Frequent urination   [] Blood in urine Skin:  [] Rashes   [] Ulcers  Psychological:  [] History of anxiety   []  History of major depression.  Physical Examination  There were no vitals filed for this visit. There is no height or weight on file to calculate BMI. Gen: WD/WN, NAD Head: [place]/AT, No temporalis wasting.  Ear/Nose/Throat: Hearing grossly intact, nares w/o erythema or drainage Eyes: PER, EOMI, sclera nonicteric.  Neck: Supple, no gross masses or lesions.  No JVD.  Pulmonary:  Good air movement, no audible wheezing, no use of accessory muscles.  Cardiac: RRR, precordium non-hyperdynamic. Vascular:     Vessel Right Left  Radial Palpable Palpable  Brachial Palpable Palpable  Gastrointestinal: soft, non-distended. No guarding/no peritoneal signs.  Musculoskeletal: M/S 5/5 throughout.  No deformity.  Neurologic: CN 2-12 intact. Pain and light touch intact in extremities.  Symmetrical.  Speech is fluent. Motor exam as listed above. Psychiatric: Judgment intact, Mood & affect appropriate for pt's clinical situation. Dermatologic: No rashes or ulcers noted.  No changes consistent with cellulitis.   CBC Lab Results  Component Value Date   WBC 3.3 (L) 01/19/2022   HGB 10.3 (L)  01/19/2022   HCT 33.7 (L) 01/19/2022   MCV 106.6 (H) 01/19/2022   PLT 70 (L) 01/19/2022    BMET    Component Value Date/Time   NA 137 01/19/2022 0651   NA 132 (L) 03/06/2019 1418   K 3.0 (L) 01/19/2022 0651   CL 99 01/19/2022 0651   CO2 25 01/19/2022 0651   GLUCOSE 120 (H) 01/19/2022 0651   BUN 25 (H) 01/19/2022 0651   BUN 41 (H) 03/06/2019 1418   CREATININE 6.22 (H) 01/19/2022 0651   CALCIUM 8.8 (L) 01/19/2022 0651   GFRNONAA 8 (L) 01/19/2022 0651   GFRAA 16 (L) 06/13/2019 1150   CrCl cannot be calculated (Patient's most recent lab result is older than the maximum 21 days allowed.).  COAG Lab Results  Component Value Date  INR 1.6 (H) 03/10/2022   INR >10.0 (HH) 03/07/2022   INR 1.6 (H) 01/17/2022    Radiology PERIPHERAL VASCULAR CATHETERIZATION  Result Date: 03/31/2022 See surgical note for result.    Assessment/Plan There are no diagnoses linked to this encounter.   Hortencia Pilar, MD  04/16/2022 8:52 AM

## 2022-04-20 ENCOUNTER — Encounter (INDEPENDENT_AMBULATORY_CARE_PROVIDER_SITE_OTHER): Payer: Self-pay | Admitting: Vascular Surgery

## 2022-05-13 ENCOUNTER — Telehealth (INDEPENDENT_AMBULATORY_CARE_PROVIDER_SITE_OTHER): Payer: Self-pay

## 2022-05-13 NOTE — Telephone Encounter (Signed)
Spoke with the patient and she has been scheduled for a revision of left wrist fistula with Dr. Delana Meyer on 06/05/22 at the [facility]. Pre-op phone call is on 05/29/22 between 8-1 pm and pre-surgical instructions will be mailed.

## 2022-05-28 ENCOUNTER — Emergency Department
Admission: EM | Admit: 2022-05-28 | Discharge: 2022-05-28 | Discharge status: Against Medical Advice | Payer: Medicare Other | Attending: Emergency Medicine | Admitting: Emergency Medicine

## 2022-05-28 ENCOUNTER — Emergency Department: Payer: Medicare Other

## 2022-05-28 ENCOUNTER — Encounter: Payer: Self-pay | Admitting: Emergency Medicine

## 2022-05-28 ENCOUNTER — Other Ambulatory Visit: Payer: Self-pay

## 2022-05-28 ENCOUNTER — Other Ambulatory Visit (INDEPENDENT_AMBULATORY_CARE_PROVIDER_SITE_OTHER): Payer: Self-pay | Admitting: Nurse Practitioner

## 2022-05-28 DIAGNOSIS — N186 End stage renal disease: Secondary | ICD-10-CM

## 2022-05-28 DIAGNOSIS — E875 Hyperkalemia: Secondary | ICD-10-CM | POA: Diagnosis not present

## 2022-05-28 DIAGNOSIS — R059 Cough, unspecified: Secondary | ICD-10-CM | POA: Insufficient documentation

## 2022-05-28 DIAGNOSIS — I5023 Acute on chronic systolic (congestive) heart failure: Secondary | ICD-10-CM | POA: Diagnosis not present

## 2022-05-28 DIAGNOSIS — R509 Fever, unspecified: Secondary | ICD-10-CM | POA: Insufficient documentation

## 2022-05-28 DIAGNOSIS — Z21 Asymptomatic human immunodeficiency virus [HIV] infection status: Secondary | ICD-10-CM | POA: Diagnosis present

## 2022-05-28 DIAGNOSIS — R112 Nausea with vomiting, unspecified: Secondary | ICD-10-CM | POA: Diagnosis not present

## 2022-05-28 DIAGNOSIS — I132 Hypertensive heart and chronic kidney disease with heart failure and with stage 5 chronic kidney disease, or end stage renal disease: Secondary | ICD-10-CM | POA: Insufficient documentation

## 2022-05-28 DIAGNOSIS — R519 Headache, unspecified: Secondary | ICD-10-CM | POA: Insufficient documentation

## 2022-05-28 DIAGNOSIS — I743 Embolism and thrombosis of arteries of the lower extremities: Secondary | ICD-10-CM | POA: Diagnosis present

## 2022-05-28 DIAGNOSIS — Z992 Dependence on renal dialysis: Secondary | ICD-10-CM | POA: Diagnosis not present

## 2022-05-28 DIAGNOSIS — D849 Immunodeficiency, unspecified: Secondary | ICD-10-CM | POA: Insufficient documentation

## 2022-05-28 DIAGNOSIS — M791 Myalgia, unspecified site: Secondary | ICD-10-CM | POA: Insufficient documentation

## 2022-05-28 DIAGNOSIS — B20 Human immunodeficiency virus [HIV] disease: Secondary | ICD-10-CM | POA: Diagnosis present

## 2022-05-28 DIAGNOSIS — R197 Diarrhea, unspecified: Secondary | ICD-10-CM | POA: Diagnosis present

## 2022-05-28 DIAGNOSIS — I10 Essential (primary) hypertension: Secondary | ICD-10-CM | POA: Diagnosis present

## 2022-05-28 DIAGNOSIS — Z1152 Encounter for screening for COVID-19: Secondary | ICD-10-CM | POA: Diagnosis not present

## 2022-05-28 DIAGNOSIS — J449 Chronic obstructive pulmonary disease, unspecified: Secondary | ICD-10-CM | POA: Diagnosis present

## 2022-05-28 DIAGNOSIS — I2699 Other pulmonary embolism without acute cor pulmonale: Secondary | ICD-10-CM | POA: Diagnosis present

## 2022-05-28 DIAGNOSIS — I5022 Chronic systolic (congestive) heart failure: Secondary | ICD-10-CM | POA: Diagnosis present

## 2022-05-28 DIAGNOSIS — Z720 Tobacco use: Secondary | ICD-10-CM | POA: Diagnosis present

## 2022-05-28 LAB — PHOSPHORUS: Phosphorus: 9.8 mg/dL — ABNORMAL HIGH (ref 2.5–4.6)

## 2022-05-28 LAB — GASTROINTESTINAL PANEL BY PCR, STOOL (REPLACES STOOL CULTURE)

## 2022-05-28 LAB — CBC WITH DIFFERENTIAL/PLATELET
Abs Immature Granulocytes: 0.06 10*3/uL (ref 0.00–0.07)
Basophils Absolute: 0 10*3/uL (ref 0.0–0.1)
Basophils Relative: 1 %
Eosinophils Absolute: 0.1 10*3/uL (ref 0.0–0.5)
Eosinophils Relative: 1 %
HCT: 36.8 % (ref 36.0–46.0)
Hemoglobin: 11.9 g/dL — ABNORMAL LOW (ref 12.0–15.0)
Immature Granulocytes: 1 %
Lymphocytes Relative: 6 %
Lymphs Abs: 0.5 10*3/uL — ABNORMAL LOW (ref 0.7–4.0)
MCH: 35.5 pg — ABNORMAL HIGH (ref 26.0–34.0)
MCHC: 32.3 g/dL (ref 30.0–36.0)
MCV: 109.9 fL — ABNORMAL HIGH (ref 80.0–100.0)
Monocytes Absolute: 0.9 10*3/uL (ref 0.1–1.0)
Monocytes Relative: 10 %
Neutro Abs: 7.2 10*3/uL (ref 1.7–7.7)
Neutrophils Relative %: 81 %
Platelets: 82 10*3/uL — ABNORMAL LOW (ref 150–400)
RBC: 3.35 MIL/uL — ABNORMAL LOW (ref 3.87–5.11)
RDW: 14.7 % (ref 11.5–15.5)
Smear Review: NORMAL
WBC: 8.8 10*3/uL (ref 4.0–10.5)
nRBC: 0 % (ref 0.0–0.2)

## 2022-05-28 LAB — COMPREHENSIVE METABOLIC PANEL
ALT: 8 U/L (ref 0–44)
AST: 13 U/L — ABNORMAL LOW (ref 15–41)
Albumin: 3.4 g/dL — ABNORMAL LOW (ref 3.5–5.0)
Alkaline Phosphatase: 56 U/L (ref 38–126)
Anion gap: 19 — ABNORMAL HIGH (ref 5–15)
BUN: 113 mg/dL — ABNORMAL HIGH (ref 6–20)
CO2: 16 mmol/L — ABNORMAL LOW (ref 22–32)
Calcium: 8.6 mg/dL — ABNORMAL LOW (ref 8.9–10.3)
Chloride: 95 mmol/L — ABNORMAL LOW (ref 98–111)
Creatinine, Ser: 16.72 mg/dL — ABNORMAL HIGH (ref 0.44–1.00)
GFR, Estimated: 2 mL/min — ABNORMAL LOW (ref >60)
Glucose, Bld: 99 mg/dL (ref 70–99)
Potassium: 6 mmol/L — ABNORMAL HIGH (ref 3.5–5.1)
Sodium: 130 mmol/L — ABNORMAL LOW (ref 135–145)
Total Bilirubin: 1.4 mg/dL — ABNORMAL HIGH (ref 0.3–1.2)
Total Protein: 8.5 g/dL — ABNORMAL HIGH (ref 6.5–8.1)

## 2022-05-28 LAB — C DIFFICILE QUICK SCREEN W PCR REFLEX
C Diff antigen: NEGATIVE
C Diff interpretation: NOT DETECTED
C Diff toxin: NEGATIVE

## 2022-05-28 LAB — PREGNANCY, URINE: Preg Test, Ur: NEGATIVE

## 2022-05-28 LAB — RESP PANEL BY RT-PCR (RSV, FLU A&B, COVID)  RVPGX2
Influenza A by PCR: NEGATIVE
Influenza B by PCR: NEGATIVE
Resp Syncytial Virus by PCR: NEGATIVE
SARS Coronavirus 2 by RT PCR: NEGATIVE

## 2022-05-28 LAB — MAGNESIUM: Magnesium: 2.2 mg/dL (ref 1.7–2.4)

## 2022-05-28 LAB — LIPASE, BLOOD: Lipase: 33 U/L (ref 11–51)

## 2022-05-28 LAB — PROCALCITONIN: Procalcitonin: 2.04 ng/mL

## 2022-05-28 MED ORDER — ALTEPLASE 2 MG IJ SOLR: 2.0000 mg | Freq: Once | INTRAMUSCULAR | Status: DC | PRN

## 2022-05-28 MED ORDER — ANTICOAGULANT SODIUM CITRATE 4% (200MG/5ML) IV SOLN: 5.0000 mL | Status: DC | PRN

## 2022-05-28 MED ORDER — ACETAMINOPHEN 325 MG PO TABS: 650.0000 mg | ORAL_TABLET | Freq: Four times a day (QID) | ORAL | Status: DC | PRN

## 2022-05-28 MED ORDER — RENA-VITE PO TABS: 1.0000 | ORAL_TABLET | Freq: Every day | ORAL | Status: DC

## 2022-05-28 MED ORDER — FUROSEMIDE 40 MG PO TABS
80.0000 mg | ORAL_TABLET | Freq: Two times a day (BID) | ORAL | Status: DC
  Filled 2022-05-28: qty 1

## 2022-05-28 MED ORDER — SODIUM BICARBONATE 8.4 % IV SOLN: 50.0000 meq | Freq: Once | INTRAVENOUS | Status: DC

## 2022-05-28 MED ORDER — CARVEDILOL 12.5 MG PO TABS
12.5000 mg | ORAL_TABLET | Freq: Two times a day (BID) | ORAL | Status: DC
  Filled 2022-05-28: qty 1

## 2022-05-28 MED ORDER — CHLORHEXIDINE GLUCONATE CLOTH 2 % EX PADS
6.0000 | MEDICATED_PAD | Freq: Every day | CUTANEOUS | Status: DC
  Filled 2022-05-28: qty 6

## 2022-05-28 MED ORDER — HEPARIN SODIUM (PORCINE) 1000 UNIT/ML DIALYSIS: 1000.0000 [IU] | INTRAMUSCULAR | Status: DC | PRN

## 2022-05-28 MED ORDER — TRAZODONE HCL 50 MG PO TABS: 50.0000 mg | ORAL_TABLET | Freq: Every evening | ORAL | Status: DC | PRN

## 2022-05-28 MED ORDER — ALBUTEROL SULFATE (2.5 MG/3ML) 0.083% IN NEBU: 3.0000 mL | INHALATION_SOLUTION | RESPIRATORY_TRACT | Status: DC | PRN

## 2022-05-28 MED ORDER — NICOTINE 21 MG/24HR TD PT24: 21.0000 mg | MEDICATED_PATCH | Freq: Every day | TRANSDERMAL | Status: DC

## 2022-05-28 MED ORDER — INSULIN ASPART 100 UNIT/ML IV SOLN
8.0000 [IU] | Freq: Once | INTRAVENOUS | Status: DC
  Filled 2022-05-28: qty 0.08

## 2022-05-28 MED ORDER — DAPSONE 100 MG PO TABS
100.0000 mg | ORAL_TABLET | Freq: Every day | ORAL | Status: DC
  Filled 2022-05-28: qty 1

## 2022-05-28 MED ORDER — DM-GUAIFENESIN ER 30-600 MG PO TB12: 1.0000 | ORAL_TABLET | Freq: Two times a day (BID) | ORAL | Status: DC | PRN

## 2022-05-28 MED ORDER — BICTEGRAVIR-EMTRICITAB-TENOFOV 50-200-25 MG PO TABS
1.0000 | ORAL_TABLET | Freq: Every day | ORAL | Status: DC
  Filled 2022-05-28: qty 1

## 2022-05-28 MED ORDER — SODIUM CHLORIDE 0.9 % IV BOLUS
500.0000 mL | Freq: Once | INTRAVENOUS | Status: AC
  Administered 2022-05-28: 500 mL via INTRAVENOUS

## 2022-05-28 MED ORDER — PREDNISOLONE ACETATE 1 % OP SUSP: 1.0000 [drp] | Freq: Every day | OPHTHALMIC | Status: DC

## 2022-05-28 MED ORDER — GABAPENTIN 300 MG PO CAPS: 300.0000 mg | ORAL_CAPSULE | Freq: Every day | ORAL | Status: DC

## 2022-05-28 MED ORDER — HEPARIN SODIUM (PORCINE) 1000 UNIT/ML IJ SOLN
INTRAMUSCULAR | Status: AC
  Filled 2022-05-28: qty 10

## 2022-05-28 MED ORDER — LIDOCAINE-PRILOCAINE 2.5-2.5 % EX CREA: 1.0000 | TOPICAL_CREAM | CUTANEOUS | Status: DC | PRN

## 2022-05-28 MED ORDER — PENTAFLUOROPROP-TETRAFLUOROETH EX AERO: 1.0000 | INHALATION_SPRAY | CUTANEOUS | Status: DC | PRN

## 2022-05-28 MED ORDER — LIDOCAINE HCL (PF) 1 % IJ SOLN: 5.0000 mL | INTRAMUSCULAR | Status: DC | PRN

## 2022-05-28 MED ORDER — CALCIUM ACETATE (PHOS BINDER) 667 MG PO CAPS: 2001.0000 mg | ORAL_CAPSULE | Freq: Three times a day (TID) | ORAL | Status: DC

## 2022-05-28 MED ORDER — DEXTROSE 50 % IV SOLN: 50.0000 mL | Freq: Once | INTRAVENOUS | Status: DC

## 2022-05-28 MED ORDER — HYDRALAZINE HCL 20 MG/ML IJ SOLN: 5.0000 mg | INTRAMUSCULAR | Status: DC | PRN

## 2022-05-28 MED ORDER — APIXABAN 2.5 MG PO TABS
2.5000 mg | ORAL_TABLET | Freq: Two times a day (BID) | ORAL | Status: DC
  Filled 2022-05-28: qty 1

## 2022-05-28 MED ORDER — PANTOPRAZOLE SODIUM 40 MG PO TBEC
40.0000 mg | DELAYED_RELEASE_TABLET | Freq: Two times a day (BID) | ORAL | Status: DC
  Administered 2022-05-28: 40 mg via ORAL
  Filled 2022-05-28: qty 1

## 2022-05-28 MED ORDER — CALCIUM GLUCONATE-NACL 1-0.675 GM/50ML-% IV SOLN: 1.0000 g | Freq: Once | INTRAVENOUS | Status: DC

## 2022-05-28 MED ORDER — DIPHENHYDRAMINE HCL 50 MG/ML IJ SOLN: 12.5000 mg | Freq: Three times a day (TID) | INTRAMUSCULAR | Status: DC | PRN

## 2022-05-28 NOTE — Progress Notes (Signed)
Hemodialysis Note  Received patient in bed to unit. Alert and oriented. Informed consent signed and in chart.   Treatment initiated:1343 Treatment completed:1718  Patient tolerated treatment well. Transported back to the room alert, without acute distress. Report given to patient's RN.  Patient request to terminate treatment early, had less than 15 minutes left in session, concerned about being cold, added a warm blanket. Patient transported back to ED 34.   Access used: Right Subclavian CVC Access issues:None   Total UF removed:1300 ml Medications given: None Post HD ON:GEXBMW  Post HD weight: 51.4 kg  Forrest Moron, RN  [facility]

## 2022-05-28 NOTE — ED Notes (Signed)
Called dialysis and they said hold calcium, insulin, dextrose and bicarb because they are coming to get pt.

## 2022-05-28 NOTE — ED Notes (Signed)
Will provide pt with meds once verified and received by and from pharm.

## 2022-05-28 NOTE — ED Notes (Signed)
While introducing myself to the pt  she sts she wasn't aware she was admitted. she had her dialysis earlier and sts she thought she was waiting for DC paperwork. she was upset when I told her that she was admitted, she wants to leave AMA. I told her I would reach out to the provider. I notified Sharion Settler NP that pt wants to leave AMA, she stated she was willing to come speak with the pt but the pt sts she does not want to wait. NP notified of the decision. Will have AMA paperwork signed by pt. Pt sts she drove herself.

## 2022-05-28 NOTE — ED Notes (Signed)
Messaged provider Mora Bellman to notify pt had dialysis successfully and to confirm whether provider still wanted certain meds given or if he wants to d/c them.

## 2022-05-28 NOTE — ED Notes (Signed)
Pt was aware of temperature and still wanted to leave AMA. AMA paperwork is signed, IV dc'd with tip intact and ambulated without assistance.

## 2022-05-28 NOTE — ED Provider Notes (Addendum)
[facility] Provider Note    Event Date/Time   First MD Initiated Contact with Patient 05/28/22 [phone]     (approximate)   History   Abdominal Pain   HPI  Rebecca Bridges is a 46 y.o. female past medical history of end-stage renal disease on dialysis, cardiomyopathy HIV hypertension who presents with diarrhea.  Patient says she has been sick since Saturday.  She has subjective fever on Saturday and then on Sunday developed nausea vomiting and diarrhea.  Is having multiple episodes of nonbloody stool per day cannot exactly quantify but thinks it is more than 10 and somewhat depends on how much she is taking in.  She has vomiting but over the last several days has mainly been nausea which increases after she takes p.o.  She does have some burning at the end of urination which has been going on since last Friday.  Has not taken her temperature at home.  Does have some bodyaches and headache as well as cough productive of yellow sputum.  Patient did not go to dialysis on Saturday Tuesday or today because she was feeling sick.  Dialysis instructed her to come to the emergency department instead.  Patient denies dyspnea.  She is having some diffuse abdominal cramping and distention.     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 L AT thromboembolecomty and PTA.   Cardiomyopathy ([place])    a. a. 07/2017 Echo: EF 45-50%, Gr1 DD; b. 01/2019 Echo: EF 20-25%, mod dil LV. Impaired relaxation. Nl RV fxn. Mild to mod MR. Ao root 3.5cm.   CHF (congestive heart failure) (HCC)    CKD (chronic kidney disease), stage III (HCC)    HFrEF (heart failure with reduced ejection fraction) ([place])    a. 07/2017 Echo: EF 45-50%, Gr1 DD; b. 01/2019 Echo: EF 20-25%.   HIV (human immunodeficiency virus infection) ([place])    a.  Diagnosed in 2015   Hypertension    LBBB (left bundle branch block)    Pancytopenia (HCC)    Tobacco abuse     Patient Active  Problem List   Diagnosis Date Noted   COPD (chronic obstructive pulmonary disease) ([place]) 04/16/2022   Acute on chronic systolic heart failure ([place]) 01/17/2022   COPD with acute bronchitis ([place]) 01/17/2022   Liver disease, chronic, with cirrhosis ([place]) 01/17/2022   Pulmonary embolism ([place]) 01/17/2022   Herpes zoster ophthalmicus, right eye 01/17/2022   Abdominal pain, RUQ 09/24/2021   Fluid overload 09/09/2021   Lung nodule 09/09/2021   Pancytopenia ([place]) 87/86/7672   Chronic systolic CHF (congestive heart failure) ([place]) 09/09/2021   Abdominal pain 09/09/2021   Pleural effusion on left 09/09/2021   ESRD on dialysis [facility])    Pleural effusion    ESRD on hemodialysis ([place]) 02/24/2021   Dilated cardiomyopathy ([place]) 02/15/2019   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[place])    Cardiomyopathy ([place])    CKD (chronic kidney disease), stage III (HCC)    LBBB (left bundle branch block)    Ischemic leg 01/26/2019   Embolism of artery of lower extremity ([place]) 01/26/2019   Left leg pain 01/24/2019   Acute on chronic renal failure ([place]) 01/24/2019   HTN (hypertension), malignant 08/07/2017   Nonintractable headache 03/17/2017   Atypical squamous cell changes of undetermined significance (ASCUS) on cervical cytology with positive high risk human papilloma virus (HPV) 03/03/2016   Cervical atypism 03/03/2016   Proteinuria 07/12/2015  Acute kidney injury ([place]) 03/14/2015   Non compliance with medical treatment 03/14/2015   Prolonged Q-T interval on ECG 03/14/2015   Thrombocytopenia ([place]) 03/14/2015   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   Tobacco abuse 11/28/2013   Human immunodeficiency virus (HIV) disease ([place]) 11/28/2013   Hypokalemia 11/27/2013   Hyponatremia 11/27/2013   Lymphadenopathy 11/27/2013   Pneumonia 11/27/2013   Splenomegaly 11/27/2013   Severe hypertension 11/26/2013   Acute on chronic combined systolic and diastolic CHF (congestive heart failure) ([place]) 11/26/2013      Physical Exam  Triage Vital Signs: ED Triage Vitals  Enc Vitals Group     BP 05/28/22 0547 (!) 133/90     Pulse Rate 05/28/22 0547 77     Resp 05/28/22 0547 18     Temp 05/28/22 0547 (!) 97.5 F (36.4 C)     Temp Source 05/28/22 0547 Oral     SpO2 05/28/22 0547 98 %     Weight 05/28/22 0539 119 lb (54 kg)     Height 05/28/22 0539 5\' 3"  (1.6 m)     Head Circumference --      Peak Flow --      Pain Score 05/28/22 0539 8     Pain Loc --      Pain Edu? --      Excl. in [place]? --     Most recent vital signs: Vitals:   05/28/22 0547 05/28/22 0745  BP: (!) 133/90 (!) 133/92  Pulse: 77 69  Resp: 18   Temp: (!) 97.5 F (36.4 C)   SpO2: 98% 100%     General: Awake, no distress.  Dry mucous membranes CV:  Good peripheral perfusion.  Resp:  Normal effort.  Abd:  Abdomen is mildly distended mild tenderness to palpation throughout but no guarding Neuro:             Awake, Alert, Oriented x 3  Other:     ED Results / Procedures / Treatments  Labs (all labs ordered are listed, but only abnormal results are displayed) Labs Reviewed  CBC WITH DIFFERENTIAL/PLATELET - Abnormal; Notable for the following components:      Result Value   RBC 3.35 (*)    Hemoglobin 11.9 (*)    MCV 109.9 (*)    MCH 35.5 (*)    Platelets 82 (*)    Lymphs Abs 0.5 (*)    All other components within normal limits  COMPREHENSIVE METABOLIC PANEL - Abnormal; Notable for the following components:   Sodium 130 (*)    Potassium 6.0 (*)    Chloride 95 (*)    CO2 16 (*)    BUN 113 (*)    Creatinine, Ser 16.72 (*)    Calcium 8.6 (*)    Total Protein 8.5 (*)    Albumin 3.4 (*)    AST 13 (*)    Total Bilirubin 1.4 (*)    GFR, Estimated 2 (*)    Anion gap 19 (*)    All other components within normal limits  GASTROINTESTINAL PANEL BY PCR, STOOL (REPLACES STOOL CULTURE)  C DIFFICILE QUICK SCREEN W PCR REFLEX    RESP PANEL BY RT-PCR (RSV, FLU A&B, COVID)  RVPGX2  GIARDIA, EIA; OVA/PARASITE  STOOL  CULTURE  OVA + PARASITE EXAM  LIPASE, BLOOD  PREGNANCY, URINE  URINALYSIS, ROUTINE W REFLEX MICROSCOPIC  PROCALCITONIN  HELPER T-LYMPH-CD4 ([facility] ONLY)  HIV-1 RNA QUANT-NO REFLEX-BLD  MAGNESIUM  PHOSPHORUS  HEPATITIS B SURFACE ANTIGEN  HEPATITIS B SURFACE  ANTIBODY, QUANTITATIVE     EKG  EKG reviewed and interpreted by myself shows sinus rhythm with left bundle branch morphology left axis deviation, ST depression lead V6 inverted T wave in aVL   RADIOLOGY I reviewed and interpreted the CXR which does not show any acute cardiopulmonary process, cardiomegaly     PROCEDURES:  Critical Care performed: No  Procedures  The patient is on the cardiac monitor to evaluate for evidence of arrhythmia and/or significant heart rate changes.   MEDICATIONS ORDERED IN ED: Medications  Chlorhexidine Gluconate Cloth 2 % PADS 6 each (has no administration in time range)  sodium chloride 0.9 % bolus 500 mL (500 mLs Intravenous New Bag/Given 05/28/22 0923)     IMPRESSION / MDM / ASSESSMENT AND PLAN / ED COURSE  I reviewed the triage vital signs and the nursing notes.                              Patient's presentation is most consistent with acute complicated illness / injury requiring diagnostic workup.  Differential diagnosis includes, but is not limited to, viral diarrhea, bacterial diarrhea, parasitic diarrhea, dehydration hyperkalemia or uremia UTI colitis diverticulitis  The patient is a 46 year old female who presents because of nausea vomiting diarrhea abdominal pain for the last 5 days.  Has had subjective fever at home as well.  Diarrhea is nonbloody but sometimes over 10 episodes per day she estimates.  Minimally able to tolerate p.o. patient is having cramping lower abdominal pain as well.  She is also having some urinary symptoms and does continue to make urine.  Patient looks dry abdomen is somewhat distended suspect she has some ascites and is mildly tender throughout.    Labs are notable for creatinine of 16 BUN 113 potassium of 6 bicarb 16 with anion gap of 19.  No leukocytosis, hemoglobin and platelets are stable.  Lipase is normal.  Will send urine.  Will also send stool studies including parasites as patient does have HIV.  I see that last viral load was from July and she was detectable at that point.  Last CD4 count seems to be from about 7 months ago and was 98.  Given duration of symptoms that are caused her to miss dialysis and how globally weak she has I think she would be best served to be admitted for further workup.  Patient's urinalysis was not sent.  I have asked patient to urinate she has not think she is able to at this time so this is still pending.     FINAL CLINICAL IMPRESSION(S) / ED DIAGNOSES   Final diagnoses:  Diarrhea, unspecified type  Hyperkalemia     Rx / DC Orders   ED Discharge Orders     None        Note:  This document was prepared using Dragon voice recognition software and may include unintentional dictation errors.   Rada Hay, MD 05/28/22 [id number]    Rada Hay, MD 05/28/22 [phone]

## 2022-05-28 NOTE — ED Notes (Signed)
Report over phone to vanessa RN in dialysis

## 2022-05-28 NOTE — Progress Notes (Signed)
       CROSS COVER NOTE  NAME: Rebecca Bridges MRN: [id number] DOB : 1975-10-07    HPI/Events of Note   Informed by nursing patient did not know she was admitted . She thought she was cleared by the cat scan being fine and thought she was going to be discharged once hemodialysis was done, and was waiting for her discharge paperwork.  Per nursing, she was aware she had a fever of 103.1. Patient did not want to discuss with me, as cross cover provider. She signed discharge against medical advice form.        Kathlene Cote NP [facility]

## 2022-05-28 NOTE — ED Notes (Signed)
Bed pad changed at pt's request as she had small loose BM during dialysis. Pt given bathwipes and pt completed self peri care. Pt denies difficulty breathing and states feels better post dialysis as had missed 2 days of dialysis.

## 2022-05-28 NOTE — ED Notes (Signed)
Pt to dialysis.

## 2022-05-28 NOTE — ED Notes (Signed)
Called dietary to make sure pt gets dinner tray. Staff states they'll send tray soon.

## 2022-05-28 NOTE — ED Triage Notes (Signed)
Pt to triage via w/c with no distress noted; reports ?UTI; st having lower abd pain accomp by urinary urgency and diarrhea with incontinence and nausea x 4 days; pt has dialysis due this morning and st that "they told her she needed to come here and have it done because it would be easier since she was having diarrhea and they didn't have the facilities to accommodate her"

## 2022-05-28 NOTE — ED Notes (Signed)
Per Norm Parcel, she talked with dialysis and they should be coming to get her in the next 10 minutes, md notified, will hold calcium, insulin and dextrose and bicarb in lue of impending dialysis.  Pt in bed, pt denies pain, resps even and unlabored.

## 2022-05-28 NOTE — Progress Notes (Signed)
[facility]  ROUNDING NOTE   Subjective:   Rebecca Bridges is a 45 y.o. female with past medical history of hypertension, HIV, HCV, tobacco abuse, pancytopenia, end-stage renal disease on hemodialysis.  Patient presents to the emergency department with complaints of weakness with diarrhea.  Patient has been admitted for Hyperkalemia [E87.5] Diarrhea, unspecified type [R19.7]  Patient is known to our practice and receives outpatient dialysis treatments at [facility] on a TTS schedule, supervised by Dr. Candiss Norse.  It appears last treatment completed on 05/19/2022.  Patient states she was too sick to obtain scheduled treatments..  Patient states she began feeling unwell this past weekend.  Illness began with diarrhea and nausea followed by vomiting.  Reports fever with chills.  Denies recent sick contacts.  Denies recent antibiotic use. Remains on room air. Complains of mild shortness of breath with exertion. Trace lower extremity edema.   Labs on ED arrival include sodium 130, potassium 6.0, serum bicarb 16, BUN 113, creatinine 6.72 with GFR 2, and hemoglobin 11.9.  We have been consulted to maintain dialysis during this admission.   Objective:  Vital signs in last 24 hours:  Temp:  [97.5 F (36.4 C)-98.1 F (36.7 C)] 98.1 F (36.7 C) (12/14 1708) Pulse Rate:  [64-105] 105 (12/14 1723) Resp:  [17-30] 30 (12/14 1723) BP: (105-174)/(71-102) 153/98 (12/14 1719) SpO2:  [91 %-100 %] 91 % (12/14 1719) Weight:  [51.4 kg-54 kg] 51.4 kg (12/14 1723)  Weight change:  Filed Weights   05/28/22 0539 05/28/22 1723  Weight: 54 kg 51.4 kg    Intake/Output: No intake/output data recorded.   Intake/Output this shift:  Total I/O In: 500 [IV Piggyback:500] Out: 1400 [Other:1400]  Physical Exam: General: Ill appearing  Head: Normocephalic, atraumatic. Moist oral mucosal membranes  Eyes: Anicteric  Neck: Supple  Lungs:  Clear to auscultation, normal effort  Heart: Regular rate and  rhythm  Abdomen:  Soft, nontender  Extremities:  Trace peripheral edema.  Neurologic: Nonfocal, moving all four extremities  Skin: No lesions  Access: Rt Permcath    Basic Metabolic Panel: Recent Labs  Lab 05/28/22 0549  NA 130*  K 6.0*  CL 95*  CO2 16*  GLUCOSE 99  BUN 113*  CREATININE 16.72*  CALCIUM 8.6*  MG 2.2  PHOS 9.8*    Liver Function Tests: Recent Labs  Lab 05/28/22 0549  AST 13*  ALT 8  ALKPHOS 56  BILITOT 1.4*  PROT 8.5*  ALBUMIN 3.4*   Recent Labs  Lab 05/28/22 0549  LIPASE 33   No results for input(s): "AMMONIA" in the last 168 hours.  CBC: Recent Labs  Lab 05/28/22 0549  WBC 8.8  NEUTROABS 7.2  HGB 11.9*  HCT 36.8  MCV 109.9*  PLT 82*    Cardiac Enzymes: No results for input(s): "CKTOTAL", "CKMB", "CKMBINDEX", "TROPONINI" in the last 168 hours.  BNP: Invalid input(s): "POCBNP"  CBG: No results for input(s): "GLUCAP" in the last 168 hours.  Microbiology: Results for orders placed or performed during the hospital encounter of 05/28/22  Resp panel by RT-PCR (RSV, Flu A&B, Covid) Anterior Nasal Swab     Status: None   Collection Time: 05/28/22  9:22 AM   Specimen: Anterior Nasal Swab  Result Value Ref Range Status   SARS Coronavirus 2 by RT PCR NEGATIVE NEGATIVE Final    Comment: (NOTE) SARS-CoV-2 target nucleic acids are NOT DETECTED.  The SARS-CoV-2 RNA is generally detectable in upper respiratory specimens during the acute phase of infection. The  lowest concentration of SARS-CoV-2 viral copies this assay can detect is 138 copies/mL. A negative result does not preclude SARS-Cov-2 infection and should not be used as the sole basis for treatment or other patient management decisions. A negative result may occur with  improper specimen collection/handling, submission of specimen other than nasopharyngeal swab, presence of viral mutation(s) within the areas targeted by this assay, and inadequate number of viral copies(<138  copies/mL). A negative result must be combined with clinical observations, patient history, and epidemiological information. The expected result is Negative.  Fact Sheet for Patients:  EntrepreneurPulse.com.au  Fact Sheet for Healthcare Providers:  IncredibleEmployment.be  This test is no t yet approved or cleared by the [place] FDA and  has been authorized for detection and/or diagnosis of SARS-CoV-2 by FDA under an Emergency Use Authorization (EUA). This EUA will remain  in effect (meaning this test can be used) for the duration of the COVID-19 declaration under Section 564(b)(1) of the Act, 21 U.S.C.section 360bbb-3(b)(1), unless the authorization is terminated  or revoked sooner.       Influenza A by PCR NEGATIVE NEGATIVE Final   Influenza B by PCR NEGATIVE NEGATIVE Final    Comment: (NOTE) The Xpert Xpress SARS-CoV-2/FLU/RSV plus assay is intended as an aid in the diagnosis of influenza from Nasopharyngeal swab specimens and should not be used as a sole basis for treatment. Nasal washings and aspirates are unacceptable for Xpert Xpress SARS-CoV-2/FLU/RSV testing.  Fact Sheet for Patients: EntrepreneurPulse.com.au  Fact Sheet for Healthcare Providers: IncredibleEmployment.be  This test is not yet approved or cleared by the [place] FDA and has been authorized for detection and/or diagnosis of SARS-CoV-2 by FDA under an Emergency Use Authorization (EUA). This EUA will remain in effect (meaning this test can be used) for the duration of the COVID-19 declaration under Section 564(b)(1) of the Act, 21 U.S.C. section 360bbb-3(b)(1), unless the authorization is terminated or revoked.     Resp Syncytial Virus by PCR NEGATIVE NEGATIVE Final    Comment: (NOTE) Fact Sheet for Patients: EntrepreneurPulse.com.au  Fact Sheet for Healthcare  Providers: IncredibleEmployment.be  This test is not yet approved or cleared by the [place] FDA and has been authorized for detection and/or diagnosis of SARS-CoV-2 by FDA under an Emergency Use Authorization (EUA). This EUA will remain in effect (meaning this test can be used) for the duration of the COVID-19 declaration under Section 564(b)(1) of the Act, 21 U.S.C. section 360bbb-3(b)(1), unless the authorization is terminated or revoked.  Performed at [facility], [address] [place], [place] [address]   Gastrointestinal Panel by PCR , Stool     Status: None   Collection Time: 05/28/22 12:10 PM   Specimen: Stool  Result Value Ref Range Status   Campylobacter species NOT DETECTED NOT DETECTED Final   Plesimonas shigelloides NOT DETECTED NOT DETECTED Final   Salmonella species NOT DETECTED NOT DETECTED Final   Yersinia enterocolitica NOT DETECTED NOT DETECTED Final   Vibrio species NOT DETECTED NOT DETECTED Final   Vibrio cholerae NOT DETECTED NOT DETECTED Final   Enteroaggregative E coli (EAEC) NOT DETECTED NOT DETECTED Final   Enteropathogenic E coli (EPEC) NOT DETECTED NOT DETECTED Final   Enterotoxigenic E coli (ETEC) NOT DETECTED NOT DETECTED Final   Shiga like toxin producing E coli (STEC) NOT DETECTED NOT DETECTED Final   Shigella/Enteroinvasive E coli (EIEC) NOT DETECTED NOT DETECTED Final   Cryptosporidium NOT DETECTED NOT DETECTED Final   Cyclospora cayetanensis NOT DETECTED NOT DETECTED Final  Entamoeba histolytica NOT DETECTED NOT DETECTED Final   Giardia lamblia NOT DETECTED NOT DETECTED Final   Adenovirus F40/41 NOT DETECTED NOT DETECTED Final   Astrovirus NOT DETECTED NOT DETECTED Final   Norovirus GI/GII NOT DETECTED NOT DETECTED Final   Rotavirus A NOT DETECTED NOT DETECTED Final   Sapovirus (I, II, IV, and V) NOT DETECTED NOT DETECTED Final    Comment: Performed at [facility], [address],  [place], [place] [address]  C Difficile Quick Screen w PCR reflex     Status: None   Collection Time: 05/28/22 12:10 PM   Specimen: Stool  Result Value Ref Range Status   C Diff antigen NEGATIVE NEGATIVE Final   C Diff toxin NEGATIVE NEGATIVE Final   C Diff interpretation No C. difficile detected.  Final    Comment: Performed at [facility], [address] [place], [place] [address]    Coagulation Studies: No results for input(s): "LABPROT", "INR" in the last 72 hours.  Urinalysis: No results for input(s): "COLORURINE", "LABSPEC", "PHURINE", "GLUCOSEU", "HGBUR", "BILIRUBINUR", "KETONESUR", "PROTEINUR", "UROBILINOGEN", "NITRITE", "LEUKOCYTESUR" in the last 72 hours.  Invalid input(s): "APPERANCEUR"    Imaging: DG Chest 2 View  Result Date: 05/28/2022 CLINICAL DATA:  46 year old female with history of pneumonia. EXAM: CHEST - 2 VIEW COMPARISON:  Chest x-ray 01/19/2022. FINDINGS: Right internal jugular PermCath with tip terminating at the superior cavoatrial junction. Lung volumes are normal. No consolidative airspace disease. No pleural effusions. No pneumothorax. No suspicious appearing pulmonary nodule or mass noted. Mild cardiomegaly. Upper mediastinal contours are within normal limits. Atherosclerotic calcifications are noted in the thoracic aorta. IMPRESSION: 1. No radiographic evidence of acute cardiopulmonary disease. 2. Mild cardiomegaly. 3. Aortic atherosclerosis. Electronically Signed   By: Vinnie Langton M.D.   On: 05/28/2022 09:14   CT ABDOMEN PELVIS WO CONTRAST  Result Date: 05/28/2022 CLINICAL DATA:  Abdominal pain, acute nonlocalized. Low abdominal pain with diarrhea. Urinary tract infection. EXAM: CT ABDOMEN AND PELVIS WITHOUT CONTRAST TECHNIQUE: Multidetector CT imaging of the abdomen and pelvis was performed following the standard protocol without IV contrast. RADIATION DOSE REDUCTION: This exam was performed according to the departmental dose-optimization program  which includes automated exposure control, adjustment of the mA and/or kV according to patient size and/or use of iterative reconstruction technique. COMPARISON:  Abdominopelvic CT 09/24/2021 and 09/09/2021. Abdominal ultrasound 10/30/2021. FINDINGS: Lower chest: Small nodules at the right lung base have been stable on previous chest CTs. Chronic cardiomegaly with interval improved aeration of the adjacent left lower lobe and lingula. Stable chronic small left pleural effusion. Hemodialysis catheter tip in the mid right atrium. Hepatobiliary: The liver appears stable without focal abnormality on noncontrast imaging. No evidence of significant biliary dilatation status post cholecystectomy. Pancreas: Unremarkable. No pancreatic ductal dilatation or surrounding inflammatory changes. Spleen: Mild splenomegaly.  No focal abnormality. Adrenals/Urinary Tract: Both adrenal glands appear normal. The kidneys appear stable with cortical thinning and renal vascular calcifications bilaterally. There are possible nonobstructing left renal calculi as before. No perinephric soft tissue stranding, hydronephrosis or ureteral calculus. Chronic bladder wall thickening, likely due to limited bladder distension, similar to prior study. Stomach/Bowel: No enteric contrast administered. The stomach appears unremarkable for its degree of distension. No evidence of bowel wall thickening, distention or surrounding inflammatory change. The appendix appears normal. Vascular/Lymphatic: There are no enlarged abdominal or pelvic lymph nodes. Diffuse aortic and branch vessel atherosclerosis. Reproductive: The uterus and ovaries appear unremarkable. No adnexal mass. Other: Chronic ascites has increased in volume. There  is mild generalized soft tissue edema. No pneumoperitoneum or focal extraluminal fluid collection identified. Musculoskeletal: No acute or significant osseous findings. IMPRESSION: 1. No acute findings or clear explanation for the  patient's symptoms. 2. Chronic ascites has increased in volume but is nonspecific in etiology. Generalized soft tissue edema. 3. Stable chronic renal cortical thinning and possible nonobstructing left renal calculi. No evidence of ureteral calculus or hydronephrosis. 4. Stable chronic cardiomegaly with improved aeration of the left lower lobe and lingula. 5.  Aortic Atherosclerosis (ICD10-I70.0). Electronically Signed   By: Richardean Sale M.D.   On: 05/28/2022 09:08     Medications:    anticoagulant sodium citrate     calcium gluconate      apixaban  2.5 mg Oral BID   bictegravir-emtricitabine-tenofovir AF  1 tablet Oral Daily   calcium acetate  2,001 mg Oral TID   carvedilol  12.5 mg Oral BID WC   Chlorhexidine Gluconate Cloth  6 each Topical Q0600   dapsone  100 mg Oral Daily   dextrose  50 mL Intravenous Once   furosemide  80 mg Oral BID   gabapentin  300 mg Oral QHS   heparin sodium (porcine)       insulin aspart  8 Units Intravenous Once   nicotine  21 mg Transdermal Daily   pantoprazole  40 mg Oral BID   prednisoLONE acetate  1 drop Right Eye QHS   Rena-Vite Rx  1 tablet Oral Daily   sodium bicarbonate  50 mEq Intravenous Once   acetaminophen, albuterol, alteplase, anticoagulant sodium citrate, dextromethorphan-guaiFENesin, diphenhydrAMINE, heparin, heparin sodium (porcine), hydrALAZINE, lidocaine (PF), lidocaine-prilocaine, pentafluoroprop-tetrafluoroeth, traZODone  Assessment/ Plan:  Ms. Tatia Petrucci is a 46 y.o.  female with past medical history of hypertension, HIV, HCV, tobacco abuse, pancytopenia, end-stage renal disease on hemodialysis.  Patient presents to the emergency department with complaints of weakness with diarrhea.  Patient has been admitted for Hyperkalemia [E87.5] Diarrhea, unspecified type [R19.7]   Hyperkalemia with end stage renal disease on hemodialysis. Las treatment completed last Tuesday. Potassium 6.0 on admission. Will perform urgent dialysis on 1k  bath. UF goal 2L as tolerated. Next treatment scheduled for Saturday.   2. Anemia of chronic kidney disease Lab Results  Component Value Date   HGB 11.9 (L) 05/28/2022    Hgb at goal. Patient receives Mircera at outpatient clinic.  3. Secondary Hyperparathyroidism: with outpatient labs: PTH 769, phosphorus 9.2, calcium 8.1 on 04/30/22.   Lab Results  Component Value Date   CALCIUM 8.6 (L) 05/28/2022   PHOS 9.8 (H) 05/28/2022    Calcium at goal. Phosphorus elevated, this should correct slowly with dialysis.Continue calcium acetate with meals.  4. Hypertension with chronic kidney disease. Home regimen includes furosemide and carvedilol. Currently prescribed.    LOS: 0 Achilles Neville 12/14/20235:59 PM

## 2022-05-28 NOTE — H&P (Addendum)
History and Physical    Rebecca Bridges FTD:[id number] DOB: 1975-10-24 DOA: 05/28/2022  Referring MD/NP/PA:   PCP: [name]   Patient coming from:  The patient is coming from home.  At baseline, pt is independent for most of ADL.        Chief Complaint: diarrhe  HPI: Rebecca Bridges is a 46 y.o. 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  Patient states that she has been sick since Saturday.  He has diarrhea, nausea, few times of nonbilious nonbloody vomiting, moderate lower abdominal cramping pain.  She states she has 8-10 times of watery diarrhea each day.  She has subjective fever and chills.  Her temperature is 97.5 in ED.  Patient has mild cough with little mucus production, mild shortness breath, no chest pain.  She also reports dysuria, urinary urgency and increased urinary frequency, but currently does not have urine in ED. Patient did not go to dialysis on Saturday. Her last dialysis was on Thursday  Data reviewed independently and ED Course: pt was found to have WBC 8.8, negative COVID PCR, potassium 6.0, bicarbonate 16, creatinine 16.72, BUN 113, temperature normal, blood pressure 133/72, heart rate 69, RR 18, oxygen saturation 98% on room air.  Chest x-ray negative for infiltration.  CT of abdomen/pelvis is negative for acute issues, but showed chronic cholecystitis.  Patient is placed on telemetry bed for obs.  Dr. Holley Raring of renal is consulted for dialysis.  CT-abd/pelvis: 1. No acute findings or clear explanation for the patient's symptoms. 2. Chronic ascites has increased in volume but is nonspecific in etiology. Generalized soft tissue edema. 3. Stable chronic renal cortical thinning and possible nonobstructing left renal calculi. No evidence of ureteral calculus or  hydronephrosis. 4. Stable chronic cardiomegaly with improved aeration of the left lower lobe and lingula. 5.  Aortic Atherosclerosis (ICD10-I70.0).   EKG: I have personally reviewed.  Sinus rhythm, QTc 523, left atrial enlargement, low voltage, poor R wave progression, old left bundle blockage, T wave peaking in  V5.   Review of Systems:   General: no fevers, chills, no body weight gain, has poor appetite, has fatigue HEENT: no blurry vision, hearing changes or sore throat Respiratory: has dyspnea, coughing, no wheezing CV: no chest pain, no palpitations GI: has nausea, vomiting, abdominal pain, diarrhea, no constipation GU: has dysuria, burning on urination, increased urinary frequency, no hematuria  Ext: has trace leg edema Neuro: no unilateral weakness, numbness, or tingling, no vision change or hearing loss Skin: no rash, no skin tear. MSK: No muscle spasm, no deformity, no limitation of range of movement in spin Heme: No easy bruising.  Travel history: No recent long distant travel.   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 L AT thromboembolecomty and PTA.   Cardiomyopathy ([place])    a. a. 07/2017 Echo: EF 45-50%, Gr1 DD; b. 01/2019 Echo: EF 20-25%, mod dil LV. Impaired relaxation. Nl RV fxn. Mild to mod MR. Ao root 3.5cm.   CHF (congestive heart failure) (HCC)    CKD (chronic kidney disease), stage III (HCC)    HFrEF (heart failure with reduced ejection fraction) ([place])    a. 07/2017 Echo: EF  45-50%, Gr1 DD; b. 01/2019 Echo: EF 20-25%.   HIV (human immunodeficiency virus infection) ([place])    a.  Diagnosed in 2015   Hypertension    LBBB (left bundle branch block)    Pancytopenia (HCC)    Tobacco abuse     Past Surgical History:  Procedure Laterality Date   A/V FISTULAGRAM Left 03/31/2022   Procedure: A/V  Fistulagram;  Surgeon: Katha Cabal, MD;  Location: [facility];  Service: Cardiovascular;  Laterality: Left;   CHOLECYSTECTOMY     LOWER EXTREMITY ANGIOGRAPHY Left 01/25/2019   Procedure: Lower Extremity Angiography;  Surgeon: Katha Cabal, MD;  Location: [facility];  Service: Cardiovascular;  Laterality: Left;   RIGHT/LEFT HEART CATH AND CORONARY ANGIOGRAPHY Bilateral 02/15/2019   Procedure: RIGHT/LEFT HEART CATH AND CORONARY ANGIOGRAPHY;  Surgeon: Minna Merritts, MD;  Location: [facility];  Service: Cardiovascular;  Laterality: Bilateral;    Social History:  reports that she has been smoking cigarettes. She has never used smokeless tobacco. She reports that she does not drink alcohol and does not use drugs.  Family History:  Family History  Problem Relation Age of Onset   Hypertension Mother    Cerebral aneurysm Mother      Prior to Admission medications   Medication Sig Start Date End Date Taking? Authorizing Provider  B Complex-C-Folic Acid (RENA-VITE RX) 1 MG TABS Take 1 tablet by mouth daily. 07/17/21   [provider]  bictegravir-emtricitabine-tenofovir AF (BIKTARVY) 50-200-25 MG TABS tablet Take 1 tablet by mouth daily.    [provider]  calcium acetate (PHOSLO) 667 MG capsule Take 2,001 mg by mouth 3 (three) times daily. 04/22/21   [provider]  carvedilol (COREG) 12.5 MG tablet Take 12.5 mg by mouth 2 (two) times daily with a meal.    [provider]  dapsone 100 MG tablet Take 100 mg by mouth daily. 09/15/21   [provider]  furosemide (LASIX) 80 MG tablet Take 80 mg by mouth daily. 03/17/22   [provider]  gabapentin (NEURONTIN) 300 MG capsule Take 300 mg by mouth at bedtime. 03/12/22   [provider]  Multiple Vitamin (MULTIVITAMIN WITH MINERALS) TABS tablet Take 1 tablet by mouth daily.    [provider]  prednisoLONE acetate (PRED FORTE) 1 % ophthalmic  suspension Place 1 drop into the right eye at bedtime. 07/16/21   [provider]  traZODone (DESYREL) 50 MG tablet SMARTSIG:1 Tablet(s) By Mouth Every Evening 03/24/22   [provider]  warfarin (COUMADIN) 2 MG tablet Take 2 mg by mouth at bedtime. 01/07/22   [provider]    Physical Exam: Vitals:   05/28/22 1030 05/28/22 1100 05/28/22 1130 05/28/22 1200  BP: 115/72 105/71 108/79 115/74  Pulse: 64 64 65 64  Resp: (!) 22 (!) 22 (!) 24 (!) 23  Temp:      TempSrc:      SpO2: 97% 98% 99% 98%  Weight:      Height:       General: Not in acute distress HEENT:       Eyes: PERRL, EOMI, no scleral icterus.       ENT: No discharge from the ears and nose, no pharynx injection, no tonsillar enlargement.        Neck: No JVD, no bruit, no mass felt. Heme: No neck lymph node enlargement. Cardiac: S1/S2, RRR, No murmurs, No gallops or rubs. Respiratory: No rales, wheezing, rhonchi or  rubs. GI: Soft, nondistended, has mild tenderness in lower abdomen, no rebound pain, no organomegaly, BS present. GU: No hematuria Ext: has trace leg edema bilaterally. 1+DP/PT pulse bilaterally. Musculoskeletal: No joint deformities, No joint redness or warmth, no limitation of ROM in spin. Skin: No rashes.  Neuro: Alert, oriented X3, cranial nerves II-XII grossly intact, moves all extremities normally. Psych: Patient is not psychotic, no suicidal or hemocidal ideation.  Labs on Admission: I have personally reviewed following labs and imaging studies  CBC: Recent Labs  Lab 05/28/22 0549  WBC 8.8  NEUTROABS 7.2  HGB 11.9*  HCT 36.8  MCV 109.9*  PLT 82*   Basic Metabolic Panel: Recent Labs  Lab 05/28/22 0549  NA 130*  K 6.0*  CL 95*  CO2 16*  GLUCOSE 99  BUN 113*  CREATININE 16.72*  CALCIUM 8.6*  MG 2.2  PHOS 9.8*   GFR: Estimated Creatinine Clearance: 3.5 mL/min (A) (by C-G formula based on SCr of 16.72 mg/dL (H)). Liver Function Tests: Recent Labs  Lab  05/28/22 0549  AST 13*  ALT 8  ALKPHOS 56  BILITOT 1.4*  PROT 8.5*  ALBUMIN 3.4*   Recent Labs  Lab 05/28/22 0549  LIPASE 33   No results for input(s): "AMMONIA" in the last 168 hours. Coagulation Profile: No results for input(s): "INR", "PROTIME" in the last 168 hours. Cardiac Enzymes: No results for input(s): "CKTOTAL", "CKMB", "CKMBINDEX", "TROPONINI" in the last 168 hours. BNP (last 3 results) No results for input(s): "PROBNP" in the last 8760 hours. HbA1C: No results for input(s): "HGBA1C" in the last 72 hours. CBG: No results for input(s): "GLUCAP" in the last 168 hours. Lipid Profile: No results for input(s): "CHOL", "HDL", "LDLCALC", "TRIG", "CHOLHDL", "LDLDIRECT" in the last 72 hours. Thyroid Function Tests: No results for input(s): "TSH", "T4TOTAL", "FREET4", "T3FREE", "THYROIDAB" in the last 72 hours. Anemia Panel: No results for input(s): "VITAMINB12", "FOLATE", "FERRITIN", "TIBC", "IRON", "RETICCTPCT" in the last 72 hours. Urine analysis:    Component Value Date/Time   COLORURINE YELLOW (A) 06/13/2019 1150   APPEARANCEUR HAZY (A) 06/13/2019 1150   LABSPEC 1.010 06/13/2019 1150   PHURINE 6.0 06/13/2019 1150   GLUCOSEU NEGATIVE 06/13/2019 1150   HGBUR NEGATIVE 06/13/2019 [place] 06/13/2019 1150   KETONESUR NEGATIVE 06/13/2019 1150   PROTEINUR >=300 (A) 06/13/2019 1150   NITRITE NEGATIVE 06/13/2019 1150   LEUKOCYTESUR SMALL (A) 06/13/2019 1150   Sepsis Labs: @LABRCNTIP (procalcitonin:4,lacticidven:4) ) Recent Results (from the past 240 hour(s))  Resp panel by RT-PCR (RSV, Flu A&B, Covid) Anterior Nasal Swab     Status: None   Collection Time: 05/28/22  9:22 AM   Specimen: Anterior Nasal Swab  Result Value Ref Range Status   SARS Coronavirus 2 by RT PCR NEGATIVE NEGATIVE Final    Comment: (NOTE) SARS-CoV-2 target nucleic acids are NOT DETECTED.  The SARS-CoV-2 RNA is generally detectable in upper respiratory specimens during the  acute phase of infection. The lowest concentration of SARS-CoV-2 viral copies this assay can detect is 138 copies/mL. A negative result does not preclude SARS-Cov-2 infection and should not be used as the sole basis for treatment or other patient management decisions. A negative result may occur with  improper specimen collection/handling, submission of specimen other than nasopharyngeal swab, presence of viral mutation(s) within the areas targeted by this assay, and inadequate number of viral copies(<138 copies/mL). A negative result must be combined with clinical observations, patient history, and epidemiological information. The expected result is Negative.  Fact  Sheet for Patients:  EntrepreneurPulse.com.au  Fact Sheet for Healthcare Providers:  IncredibleEmployment.be  This test is no t yet approved or cleared by the [place] FDA and  has been authorized for detection and/or diagnosis of SARS-CoV-2 by FDA under an Emergency Use Authorization (EUA). This EUA will remain  in effect (meaning this test can be used) for the duration of the COVID-19 declaration under Section 564(b)(1) of the Act, 21 U.S.C.section 360bbb-3(b)(1), unless the authorization is terminated  or revoked sooner.       Influenza A by PCR NEGATIVE NEGATIVE Final   Influenza B by PCR NEGATIVE NEGATIVE Final    Comment: (NOTE) The Xpert Xpress SARS-CoV-2/FLU/RSV plus assay is intended as an aid in the diagnosis of influenza from Nasopharyngeal swab specimens and should not be used as a sole basis for treatment. Nasal washings and aspirates are unacceptable for Xpert Xpress SARS-CoV-2/FLU/RSV testing.  Fact Sheet for Patients: EntrepreneurPulse.com.au  Fact Sheet for Healthcare Providers: IncredibleEmployment.be  This test is not yet approved or cleared by the [place] FDA and has been authorized for detection and/or  diagnosis of SARS-CoV-2 by FDA under an Emergency Use Authorization (EUA). This EUA will remain in effect (meaning this test can be used) for the duration of the COVID-19 declaration under Section 564(b)(1) of the Act, 21 U.S.C. section 360bbb-3(b)(1), unless the authorization is terminated or revoked.     Resp Syncytial Virus by PCR NEGATIVE NEGATIVE Final    Comment: (NOTE) Fact Sheet for Patients: EntrepreneurPulse.com.au  Fact Sheet for Healthcare Providers: IncredibleEmployment.be  This test is not yet approved or cleared by the [place] FDA and has been authorized for detection and/or diagnosis of SARS-CoV-2 by FDA under an Emergency Use Authorization (EUA). This EUA will remain in effect (meaning this test can be used) for the duration of the COVID-19 declaration under Section 564(b)(1) of the Act, 21 U.S.C. section 360bbb-3(b)(1), unless the authorization is terminated or revoked.  Performed at [facility], [address] [place], [place] [address]      Radiological Exams on Admission: DG Chest 2 View  Result Date: 05/28/2022 CLINICAL DATA:  46 year old female with history of pneumonia. EXAM: CHEST - 2 VIEW COMPARISON:  Chest x-ray 01/19/2022. FINDINGS: Right internal jugular PermCath with tip terminating at the superior cavoatrial junction. Lung volumes are normal. No consolidative airspace disease. No pleural effusions. No pneumothorax. No suspicious appearing pulmonary nodule or mass noted. Mild cardiomegaly. Upper mediastinal contours are within normal limits. Atherosclerotic calcifications are noted in the thoracic aorta. IMPRESSION: 1. No radiographic evidence of acute cardiopulmonary disease. 2. Mild cardiomegaly. 3. Aortic atherosclerosis. Electronically Signed   By: Vinnie Langton M.D.   On: 05/28/2022 09:14   CT ABDOMEN PELVIS WO CONTRAST  Result Date: 05/28/2022 CLINICAL DATA:  Abdominal pain, acute  nonlocalized. Low abdominal pain with diarrhea. Urinary tract infection. EXAM: CT ABDOMEN AND PELVIS WITHOUT CONTRAST TECHNIQUE: Multidetector CT imaging of the abdomen and pelvis was performed following the standard protocol without IV contrast. RADIATION DOSE REDUCTION: This exam was performed according to the departmental dose-optimization program which includes automated exposure control, adjustment of the mA and/or kV according to patient size and/or use of iterative reconstruction technique. COMPARISON:  Abdominopelvic CT 09/24/2021 and 09/09/2021. Abdominal ultrasound 10/30/2021. FINDINGS: Lower chest: Small nodules at the right lung base have been stable on previous chest CTs. Chronic cardiomegaly with interval improved aeration of the adjacent left lower lobe and lingula. Stable chronic small left pleural effusion. Hemodialysis catheter tip in the  mid right atrium. Hepatobiliary: The liver appears stable without focal abnormality on noncontrast imaging. No evidence of significant biliary dilatation status post cholecystectomy. Pancreas: Unremarkable. No pancreatic ductal dilatation or surrounding inflammatory changes. Spleen: Mild splenomegaly.  No focal abnormality. Adrenals/Urinary Tract: Both adrenal glands appear normal. The kidneys appear stable with cortical thinning and renal vascular calcifications bilaterally. There are possible nonobstructing left renal calculi as before. No perinephric soft tissue stranding, hydronephrosis or ureteral calculus. Chronic bladder wall thickening, likely due to limited bladder distension, similar to prior study. Stomach/Bowel: No enteric contrast administered. The stomach appears unremarkable for its degree of distension. No evidence of bowel wall thickening, distention or surrounding inflammatory change. The appendix appears normal. Vascular/Lymphatic: There are no enlarged abdominal or pelvic lymph nodes. Diffuse aortic and branch vessel atherosclerosis.  Reproductive: The uterus and ovaries appear unremarkable. No adnexal mass. Other: Chronic ascites has increased in volume. There is mild generalized soft tissue edema. No pneumoperitoneum or focal extraluminal fluid collection identified. Musculoskeletal: No acute or significant osseous findings. IMPRESSION: 1. No acute findings or clear explanation for the patient's symptoms. 2. Chronic ascites has increased in volume but is nonspecific in etiology. Generalized soft tissue edema. 3. Stable chronic renal cortical thinning and possible nonobstructing left renal calculi. No evidence of ureteral calculus or hydronephrosis. 4. Stable chronic cardiomegaly with improved aeration of the left lower lobe and lingula. 5.  Aortic Atherosclerosis (ICD10-I70.0). Electronically Signed   By: Richardean Sale M.D.   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 has T wave peaking in V5.  -Placed on telemetry bed for position - treatment: 1 g calcium gluconate, D50, 8 units of NovoLog, 50 mEq of sodium bicarbonate were ordered, but were not given since pt started HD - Dr. Holley Raring of renal is consulted for HD. 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  WBC 3.3, hemoglobin 11.9,  platelet 82 (patient had hemoglobin 10.3, platelet 70 on 01/19/2022) -Follow-up CBC   Chronic systolic CHF (congestive heart failure) ([place]): 2D echo 01/27/2019 showed EF of 20-25%.   -Continue home Lasix -Volume management per renal by dialysis   Diarrhea -f/u C. 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     Disposition Plan:  Anticipate discharge back to previous environment  Consults called:  Dr. Holley Raring of renal  Admission status and Level of care: Telemetry Medical:   for obs    Dispo: The patient is from: Home  Anticipated d/c is to: Home              Anticipated d/c date is: 1 day              Patient currently is not medically stable to d/c.    Severity of Illness:  The appropriate patient status for this patient is OBSERVATION. Observation status is judged to be reasonable and necessary in order to provide the required intensity of service to ensure the patient's safety. The patient's presenting symptoms, physical exam findings, and initial radiographic and laboratory data in the context of their medical condition is felt to place them at decreased risk for further clinical deterioration. Furthermore, it is anticipated that the patient will be medically stable for discharge from the hospital within 2 midnights of admission.        Date of Service 05/28/2022    [facility]   If 7PM-7AM, please contact night-coverage [email or web address] 05/28/2022, 12:17 PM

## 2022-05-29 ENCOUNTER — Inpatient Hospital Stay
Admission: RE | Admit: 2022-05-29 | Discharge: 2022-05-29 | Disposition: A | Discharge status: Home / Self Care | Payer: Medicare Other | Source: Ambulatory Visit

## 2022-05-29 ENCOUNTER — Encounter: Payer: Self-pay | Admitting: Urgent Care

## 2022-05-29 LAB — HELPER T-LYMPH-CD4 (ARMC ONLY)
% CD 4 Pos. Lymph.: 20.7 % — ABNORMAL LOW (ref 30.8–58.5)
Absolute CD 4 Helper: 104 /uL — ABNORMAL LOW (ref 359–1519)
Basophils Absolute: 0 10*3/uL (ref 0.0–0.2)
Basos: 0 %
EOS (ABSOLUTE): 0.1 10*3/uL (ref 0.0–0.4)
Eos: 1 %
Hematocrit: 36.3 % (ref 34.0–46.6)
Hemoglobin: 13.2 g/dL (ref 11.1–15.9)
Immature Grans (Abs): 0.1 10*3/uL (ref 0.0–0.1)
Immature Granulocytes: 1 %
Lymphocytes Absolute: 0.5 10*3/uL — ABNORMAL LOW (ref 0.7–3.1)
Lymphs: 4 %
MCH: 37 pg — ABNORMAL HIGH (ref 26.6–33.0)
MCHC: 36.4 g/dL — ABNORMAL HIGH (ref 31.5–35.7)
MCV: 102 fL — ABNORMAL HIGH (ref 79–97)
Monocytes Absolute: 0.5 10*3/uL (ref 0.1–0.9)
Monocytes: 5 %
Neutrophils Absolute: 9.6 10*3/uL — ABNORMAL HIGH (ref 1.4–7.0)
Neutrophils: 89 %
Platelets: 117 10*3/uL — ABNORMAL LOW (ref 150–450)
RBC: 3.57 x10E6/uL — ABNORMAL LOW (ref 3.77–5.28)
RDW: 14 % (ref 11.7–15.4)
WBC: 10.8 10*3/uL (ref 3.4–10.8)

## 2022-05-29 LAB — HEPATITIS B SURFACE ANTIGEN: Hepatitis B Surface Ag: NONREACTIVE

## 2022-05-29 LAB — HIV-1 RNA QUANT-NO REFLEX-BLD
HIV 1 RNA Quant: <20 copies/mL
LOG10 HIV-1 RNA: UNDETERMINED log10copy/mL

## 2022-05-29 LAB — HEPATITIS B SURFACE ANTIBODY, QUANTITATIVE: Hep B S AB Quant (Post): <3.1 m[IU]/mL — ABNORMAL LOW (ref >9.9)

## 2022-05-29 NOTE — Patient Instructions (Signed)
Your procedure is scheduled on: 06/05/22 - Friday Report to the Registration Desk on the 1st floor of the [facility]. To find out your arrival time, please call [phone] between 1PM - 3PM on: 06/04/22 - Thursday If your arrival time is 6:00 am, do not arrive prior to that time as the [facility] entrance doors do not open until 6:00 am.  REMEMBER: Instructions that are not followed completely may result in serious medical risk, up to and including death; or upon the discretion of your surgeon and anesthesiologist your surgery may need to be rescheduled.  Do not eat food or drink any liquids after midnight the night before surgery.  No gum chewing, lozengers or hard candies.  TAKE THESE MEDICATIONS THE MORNING OF SURGERY WITH A SIP OF WATER:  - bictegravir-emtricitabine-tenofovir AF (BIKTARVY)  - carvedilol (COREG)  - pantoprazole (PROTONIX) , (take one the night before and one on the morning of surgery - helps to prevent nausea after surgery.)   Hold Eliquis 06/02/22 until after your procedure.   One week prior to surgery: Stop Anti-inflammatories (NSAIDS) such as Advil, Aleve, Ibuprofen, Motrin, Naproxen, Naprosyn and Aspirin based products such as Excedrin, Goodys Powder, BC Powder.  Stop ANY OVER THE COUNTER supplements until after surgery.  You may however, continue to take Tylenol if needed for pain up until the day of surgery.  No Alcohol for 24 hours before or after surgery.  No Smoking including e-cigarettes for 24 hours prior to surgery.  No chewable tobacco products for at least 6 hours prior to surgery.  No nicotine patches on the day of surgery.  Do not use any "recreational" drugs for at least a week prior to your surgery.  Please be advised that the combination of cocaine and anesthesia may have negative outcomes, up to and including death. If you test positive for cocaine, your surgery will be cancelled.  On the morning of surgery brush your teeth with  toothpaste and water, you may rinse your mouth with mouthwash if you wish. Do not swallow any toothpaste or mouthwash.  Use CHG Soap or wipes as directed on instruction sheet.  Do not wear jewelry, make-up, hairpins, clips or nail polish.  Do not wear lotions, powders, or perfumes.   Do not shave body from the neck down 48 hours prior to surgery just in case you cut yourself which could leave a site for infection.  Also, freshly shaved skin may become irritated if using the CHG soap.  Contact lenses, hearing aids and dentures may not be worn into surgery.  Do not bring valuables to the hospital. [facility] is not responsible for any missing/lost belongings or valuables.   Notify your doctor if there is any change in your medical condition (cold, fever, infection).  Wear comfortable clothing (specific to your surgery type) to the hospital.  After surgery, you can help prevent lung complications by doing breathing exercises.  Take deep breaths and cough every 1-2 hours. Your doctor may order a device called an Incentive Spirometer to help you take deep breaths. When coughing or sneezing, hold a pillow firmly against your incision with both hands. This is called "splinting." Doing this helps protect your incision. It also decreases belly discomfort.  If you are being admitted to the hospital overnight, leave your suitcase in the car. After surgery it may be brought to your room.  If you are being discharged the day of surgery, you will not be allowed to drive home. You will  need a responsible adult (18 years or older) to drive you home and stay with you that night.   If you are taking public transportation, you will need to have a responsible adult (18 years or older) with you. Please confirm with your physician that it is acceptable to use public transportation.   Please call the [facility] Dept. at [phone] if you have any questions about these  instructions.  Surgery Visitation Policy:  Patients undergoing a surgery or procedure may have two family members or support persons with them as long as the person is not COVID-19 positive or experiencing its symptoms.   Inpatient Visitation:    Visiting hours are 7 a.m. to 8 p.m. Up to four visitors are allowed at one time in a patient room. The visitors may rotate out with other people during the day. One designated support person (adult) may remain overnight.  Due to an increase in RSV and influenza rates and associated hospitalizations, children ages 76 and under will not be able to visit patients in [facility]. Masks continue to be strongly recommended.

## 2022-05-29 NOTE — Pre-Procedure Instructions (Signed)
PATIENT WAS SEEN IN ER ON 05/28/22, SIGNED OUT AMA, DR. Delana Meyer AND Eulogio Ditch NP, MADE AWARE BY SECURE CHAT MESSAGE.

## 2022-05-29 NOTE — Pre-Procedure Instructions (Signed)
attempted X 2 TO REACH PATIENT FOR HER pat PHONE CALL, MESSAGE LEFT AND WAITING ON RETURN CALL.

## 2022-05-29 NOTE — Discharge Summary (Addendum)
Physician Discharge Summary  Rebecca Bridges ZOX:[id number] DOB: 1976/05/11 DOA: 05/28/2022  PCP: [name]  Admit date: 05/28/2022 Discharge date: 05/28/24  Recommendations for Outpatient Follow-up:  -none since pt left hospital on [place]: none Equipment/Devices: none  Discharge Condition: not ready to be discharged, but left on AMA CODE STATUS: full Diet recommendation: should be on renal diet  Brief/Interim Summary (HPI):  Rebecca Bridges is a 46 y.o. 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   Patient states that she has been sick since Saturday.  He has diarrhea, nausea, few times of nonbilious nonbloody vomiting, moderate lower abdominal cramping pain.  She states she has 8-10 times of watery diarrhea each day.  She has subjective fever and chills.  Her temperature is 97.5 in ED.  Patient has mild cough with little mucus production, mild shortness breath, no chest pain.  She also reports dysuria, urinary urgency and increased urinary frequency, but currently does not have urine in ED. Patient did not go to dialysis on Saturday. Her last dialysis was on Thursday   Data reviewed independently and ED Course: pt was found to have WBC 8.8, negative COVID PCR, potassium 6.0, bicarbonate 16, creatinine 16.72, BUN 113, temperature normal, blood pressure 133/72, heart rate 69, RR 18, oxygen saturation 98% on room air.  Chest x-ray negative for infiltration.  CT of abdomen/pelvis is negative for acute issues, but showed chronic cholecystitis.  Patient is placed on telemetry bed for obs.  Dr. Holley Raring of renal is consulted for dialysis.   CT-abd/pelvis: 1. No acute findings or clear explanation for the patient's symptoms. 2. Chronic ascites has increased in volume but is nonspecific in etiology.  Generalized soft tissue edema. 3. Stable chronic renal cortical thinning and possible nonobstructing left renal calculi. No evidence of ureteral calculus or hydronephrosis. 4. Stable chronic cardiomegaly with improved aeration of the left lower lobe and lingula. 5.  Aortic Atherosclerosis (ICD10-I70.0).     EKG: I have personally reviewed.  Sinus rhythm, QTc 523, left atrial enlargement, low voltage, poor R wave progression, old left bundle blockage, T wave peaking in  V5.   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 Per cross cover provider, pt developed fever of 103. Pt did not want to discuss with cross cover provider. Pt was aware that she had a fever of 103.1. She signed discharge against medical advice form    Hyperkalemia: K 6.0. has T wave peaking in V5.  -pt had urgent dialysis before left on AMA - treatment: 1 g calcium gluconate, D50, 8 units of NovoLog, 50 mEq of sodium bicarbonate were ordered, but were not given since pt started HD - 500 cc of NS   HTN (hypertension), malignant -IV hydralazine as needed in hospital -Coreg -Patient is also on Lasix   Embolism of artery of lower  extremity ([place]) -Continued home Eliquis   Tobacco abuse -Nicotine patch   Human immunodeficiency virus (HIV) disease ([place]): CD4=98 and VL nondetectable on 11/12/2021 -Continued home Biktarvy -Dapason    Pancytopenia ([place]): Stable.  WBC 3.3, hemoglobin 11.9, platelet 82 (patient had hemoglobin  10.3, platelet 70 on 01/19/2022) -Follow-up CBC   Chronic systolic CHF (congestive heart failure) ([place]): 2D echo 01/27/2019 showed EF of 20-25%.   -Continued home Lasix -Volume management per renal by dialysis   Diarrhea -f/u C. 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   calcium acetate 667 MG capsule Commonly known as: PHOSLO Take 2,001 mg by mouth 3 (three) times daily.   carvedilol 12.5 MG tablet Commonly known as: COREG Take 12.5 mg by mouth 2 (two) times daily with a meal.   dapsone 100 MG tablet Take 100 mg by mouth daily.   Eliquis 2.5 MG Tabs tablet Generic drug: apixaban Take 2.5 mg by mouth 2 (two) times daily.   furosemide 80 MG tablet Commonly known as: LASIX Take 80 mg by mouth 2 (two) times daily.   gabapentin 300 MG capsule Commonly known as: NEURONTIN Take 300 mg by mouth at bedtime.   multivitamin with minerals Tabs tablet Take 1 tablet by mouth daily.   pantoprazole 40 MG tablet Commonly known as: PROTONIX Take 40 mg by mouth 2 (two) times daily.   prednisoLONE acetate 1 % ophthalmic suspension Commonly known as: PRED FORTE Place 1 drop into the right eye at bedtime.   Rena-Vite Rx 1 MG Tabs Take 1 tablet by mouth daily.   traZODone 50 MG tablet Commonly known as: DESYREL SMARTSIG:1 Tablet(s) By Mouth Every Evening   warfarin 2 MG tablet Commonly known as: COUMADIN Take 2 mg by mouth at bedtime.        Allergies  Allergen Reactions    Neosporin [Bacitracin-Polymyxin B] Rash   Sulfa Antibiotics Rash    Consultations: renal   Procedures/Studies: DG Chest 2 View  Result Date: 05/28/2022 CLINICAL DATA:  46 year old female with history of pneumonia. EXAM: CHEST - 2 VIEW COMPARISON:  Chest x-ray 01/19/2022. FINDINGS: Right internal jugular PermCath with tip terminating at the superior cavoatrial junction. Lung volumes are normal. No consolidative airspace disease. No pleural effusions. No pneumothorax. No suspicious appearing pulmonary nodule or mass noted. Mild cardiomegaly. Upper mediastinal contours are within normal limits. Atherosclerotic calcifications are noted in the thoracic aorta. IMPRESSION: 1. No radiographic evidence of acute cardiopulmonary disease. 2. Mild cardiomegaly. 3. Aortic atherosclerosis. Electronically Signed   By: Vinnie Langton M.D.   On: 05/28/2022 09:14   CT ABDOMEN PELVIS WO CONTRAST  Result Date: 05/28/2022 CLINICAL DATA:  Abdominal pain, acute nonlocalized. Low abdominal pain with diarrhea. Urinary tract infection. EXAM: CT ABDOMEN AND PELVIS WITHOUT CONTRAST TECHNIQUE: Multidetector CT imaging of the abdomen and pelvis was performed following the standard protocol without IV contrast. RADIATION DOSE REDUCTION: This exam was performed according to the departmental dose-optimization program which includes automated exposure control, adjustment of the  mA and/or kV according to patient size and/or use of iterative reconstruction technique. COMPARISON:  Abdominopelvic CT 09/24/2021 and 09/09/2021. Abdominal ultrasound 10/30/2021. FINDINGS: Lower chest: Small nodules at the right lung base have been stable on previous chest CTs. Chronic cardiomegaly with interval improved aeration of the adjacent left lower lobe and lingula. Stable chronic small left pleural effusion. Hemodialysis catheter tip in the mid right atrium. Hepatobiliary: The liver appears stable without focal abnormality on noncontrast imaging.  No evidence of significant biliary dilatation status post cholecystectomy. Pancreas: Unremarkable. No pancreatic ductal dilatation or surrounding inflammatory changes. Spleen: Mild splenomegaly.  No focal abnormality. Adrenals/Urinary Tract: Both adrenal glands appear normal. The kidneys appear stable with cortical thinning and renal vascular calcifications bilaterally. There are possible nonobstructing left renal calculi as before. No perinephric soft tissue stranding, hydronephrosis or ureteral calculus. Chronic bladder wall thickening, likely due to limited bladder distension, similar to prior study. Stomach/Bowel: No enteric contrast administered. The stomach appears unremarkable for its degree of distension. No evidence of bowel wall thickening, distention or surrounding inflammatory change. The appendix appears normal. Vascular/Lymphatic: There are no enlarged abdominal or pelvic lymph nodes. Diffuse aortic and branch vessel atherosclerosis. Reproductive: The uterus and ovaries appear unremarkable. No adnexal mass. Other: Chronic ascites has increased in volume. There is mild generalized soft tissue edema. No pneumoperitoneum or focal extraluminal fluid collection identified. Musculoskeletal: No acute or significant osseous findings. IMPRESSION: 1. No acute findings or clear explanation for the patient's symptoms. 2. Chronic ascites has increased in volume but is nonspecific in etiology. Generalized soft tissue edema. 3. Stable chronic renal cortical thinning and possible nonobstructing left renal calculi. No evidence of ureteral calculus or hydronephrosis. 4. Stable chronic cardiomegaly with improved aeration of the left lower lobe and lingula. 5.  Aortic Atherosclerosis (ICD10-I70.0). Electronically Signed   By: Richardean Sale M.D.   On: 05/28/2022 09:08      Discharge Exam: Vitals:   05/28/22 1900 05/28/22 2001  BP: 127/76 122/77  Pulse: 95 95  Resp: (!) 22 18  Temp:  (!) 103.1 F (39.5 C)   SpO2: 97% 97%   Vitals:   05/28/22 1815 05/28/22 1830 05/28/22 1900 05/28/22 2001  BP: 126/73 133/84 127/76 122/77  Pulse: 99 99 95 95  Resp:   (!) 22 18  Temp:    (!) 103.1 F (39.5 C)  TempSrc:    Oral  SpO2: 98% 95% 97% 97%  Weight:      Height:       Physical examination was not performed since I was not in the hospital when patient left AMA.   The results of significant diagnostics from this hospitalization (including imaging, microbiology, ancillary and laboratory) are listed below for reference.     Microbiology: Recent Results (from the past 240 hour(s))  Resp panel by RT-PCR (RSV, Flu A&B, Covid) Anterior Nasal Swab     Status: None   Collection Time: 05/28/22  9:22 AM   Specimen: Anterior Nasal Swab  Result Value Ref Range Status   SARS Coronavirus 2 by RT PCR NEGATIVE NEGATIVE Final    Comment: (NOTE) SARS-CoV-2 target nucleic acids are NOT DETECTED.  The SARS-CoV-2 RNA is generally detectable in upper respiratory specimens during the acute phase of infection. The lowest concentration of SARS-CoV-2 viral copies this assay can detect is 138 copies/mL. A negative result does not preclude SARS-Cov-2 infection and should not be used as the sole basis for treatment or other patient management decisions. A negative result may occur with  improper specimen collection/handling, submission of specimen other than nasopharyngeal swab, presence of viral mutation(s) within the areas targeted by this assay, and inadequate number of viral copies(<138 copies/mL). A negative result must be combined with clinical observations, patient history, and epidemiological information. The expected result is Negative.  Fact Sheet for Patients:  EntrepreneurPulse.com.au  Fact Sheet for Healthcare Providers:  IncredibleEmployment.be  This test is no t yet approved or cleared by the [place] FDA and  has been authorized for detection and/or  diagnosis of SARS-CoV-2 by FDA under an Emergency Use Authorization (EUA). This EUA will remain  in effect (meaning this test can be used) for the duration of the COVID-19 declaration under Section 564(b)(1) of the Act, 21 U.S.C.section 360bbb-3(b)(1), unless the authorization is terminated  or revoked sooner.       Influenza A by PCR NEGATIVE NEGATIVE Final   Influenza B by PCR NEGATIVE NEGATIVE Final    Comment: (NOTE) The Xpert Xpress SARS-CoV-2/FLU/RSV plus assay is intended as an aid in the diagnosis of influenza from Nasopharyngeal swab specimens and should not be used as a sole basis for treatment. Nasal washings and aspirates are unacceptable for Xpert Xpress SARS-CoV-2/FLU/RSV testing.  Fact Sheet for Patients: EntrepreneurPulse.com.au  Fact Sheet for Healthcare Providers: IncredibleEmployment.be  This test is not yet approved or cleared by the [place] FDA and has been authorized for detection and/or diagnosis of SARS-CoV-2 by FDA under an Emergency Use Authorization (EUA). This EUA will remain in effect (meaning this test can be used) for the duration of the COVID-19 declaration under Section 564(b)(1) of the Act, 21 U.S.C. section 360bbb-3(b)(1), unless the authorization is terminated or revoked.     Resp Syncytial Virus by PCR NEGATIVE NEGATIVE Final    Comment: (NOTE) Fact Sheet for Patients: EntrepreneurPulse.com.au  Fact Sheet for Healthcare Providers: IncredibleEmployment.be  This test is not yet approved or cleared by the [place] FDA and has been authorized for detection and/or diagnosis of SARS-CoV-2 by FDA under an Emergency Use Authorization (EUA). This EUA will remain in effect (meaning this test can be used) for the duration of the COVID-19 declaration under Section 564(b)(1) of the Act, 21 U.S.C. section 360bbb-3(b)(1), unless the authorization is terminated  or revoked.  Performed at [facility], [address] [place], [place] [address]   Gastrointestinal Panel by PCR , Stool     Status: None   Collection Time: 05/28/22 12:10 PM   Specimen: Stool  Result Value Ref Range Status   Campylobacter species NOT DETECTED NOT DETECTED Final   Plesimonas shigelloides NOT DETECTED NOT DETECTED Final   Salmonella species NOT DETECTED NOT DETECTED Final   Yersinia enterocolitica NOT DETECTED NOT DETECTED Final   Vibrio species NOT DETECTED NOT DETECTED Final   Vibrio cholerae NOT DETECTED NOT DETECTED Final   Enteroaggregative E coli (EAEC) NOT DETECTED NOT DETECTED Final   Enteropathogenic E coli (EPEC) NOT DETECTED NOT DETECTED Final   Enterotoxigenic E coli (ETEC) NOT DETECTED NOT DETECTED Final   Shiga like toxin producing E coli (STEC) NOT DETECTED NOT DETECTED Final   Shigella/Enteroinvasive E coli (EIEC) NOT DETECTED NOT DETECTED Final   Cryptosporidium NOT DETECTED NOT DETECTED Final   Cyclospora cayetanensis NOT DETECTED NOT DETECTED Final   Entamoeba histolytica NOT DETECTED NOT DETECTED Final   Giardia lamblia NOT DETECTED NOT DETECTED Final   Adenovirus F40/41 NOT DETECTED NOT DETECTED Final   Astrovirus NOT DETECTED NOT DETECTED Final   Norovirus GI/GII NOT DETECTED NOT DETECTED Final  Rotavirus A NOT DETECTED NOT DETECTED Final   Sapovirus (I, II, IV, and V) NOT DETECTED NOT DETECTED Final    Comment: Performed at [facility], [address] [place], [place] [address]  C Difficile Quick Screen w PCR reflex     Status: None   Collection Time: 05/28/22 12:10 PM   Specimen: Stool  Result Value Ref Range Status   C Diff antigen NEGATIVE NEGATIVE Final   C Diff toxin NEGATIVE NEGATIVE Final   C Diff interpretation No C. difficile detected.  Final    Comment: Performed at [facility], [address] [place],  [address]     Labs: BNP (last 3 results) Recent Labs    09/24/21 0950  01/17/22 0851 01/19/22 0651  BNP >4,500.0* >4,500.0* >5,329.9*   Basic Metabolic Panel: Recent Labs  Lab 05/28/22 0549  NA 130*  K 6.0*  CL 95*  CO2 16*  GLUCOSE 99  BUN 113*  CREATININE 16.72*  CALCIUM 8.6*  MG 2.2  PHOS 9.8*   Liver Function Tests: Recent Labs  Lab 05/28/22 0549  AST 13*  ALT 8  ALKPHOS 56  BILITOT 1.4*  PROT 8.5*  ALBUMIN 3.4*   Recent Labs  Lab 05/28/22 0549  LIPASE 33   No results for input(s): "AMMONIA" in the last 168 hours. CBC: Recent Labs  Lab 05/28/22 0549 05/28/22 1344  WBC 8.8 10.8  NEUTROABS 7.2 9.6*  HGB 11.9* 13.2  HCT 36.8 36.3  MCV 109.9* 102*  PLT 82* 117*   Cardiac Enzymes: No results for input(s): "CKTOTAL", "CKMB", "CKMBINDEX", "TROPONINI" in the last 168 hours. BNP: Invalid input(s): "POCBNP" CBG: No results for input(s): "GLUCAP" in the last 168 hours. D-Dimer No results for input(s): "DDIMER" in the last 72 hours. Hgb A1c No results for input(s): "HGBA1C" in the last 72 hours. Lipid Profile No results for input(s): "CHOL", "HDL", "LDLCALC", "TRIG", "CHOLHDL", "LDLDIRECT" in the last 72 hours. Thyroid function studies No results for input(s): "TSH", "T4TOTAL", "T3FREE", "THYROIDAB" in the last 72 hours.  Invalid input(s): "FREET3" Anemia work up No results for input(s): "VITAMINB12", "FOLATE", "FERRITIN", "TIBC", "IRON", "RETICCTPCT" in the last 72 hours. Urinalysis    Component Value Date/Time   COLORURINE YELLOW (A) 06/13/2019 1150   APPEARANCEUR HAZY (A) 06/13/2019 1150   LABSPEC 1.010 06/13/2019 1150   PHURINE 6.0 06/13/2019 1150   GLUCOSEU NEGATIVE 06/13/2019 1150   HGBUR NEGATIVE 06/13/2019 [place] 06/13/2019 1150   KETONESUR NEGATIVE 06/13/2019 1150   PROTEINUR >=300 (A) 06/13/2019 1150   NITRITE NEGATIVE 06/13/2019 1150   LEUKOCYTESUR SMALL (A) 06/13/2019 1150   Sepsis Labs Recent Labs  Lab 05/28/22 0549 05/28/22 1344  WBC 8.8 10.8   Microbiology Recent  Results (from the past 240 hour(s))  Resp panel by RT-PCR (RSV, Flu A&B, Covid) Anterior Nasal Swab     Status: None   Collection Time: 05/28/22  9:22 AM   Specimen: Anterior Nasal Swab  Result Value Ref Range Status   SARS Coronavirus 2 by RT PCR NEGATIVE NEGATIVE Final    Comment: (NOTE) SARS-CoV-2 target nucleic acids are NOT DETECTED.  The SARS-CoV-2 RNA is generally detectable in upper respiratory specimens during the acute phase of infection. The lowest concentration of SARS-CoV-2 viral copies this assay can detect is 138 copies/mL. A negative result does not preclude SARS-Cov-2 infection and should not be used as the sole basis for treatment or other patient management decisions. A negative result may occur with  improper  specimen collection/handling, submission of specimen other than nasopharyngeal swab, presence of viral mutation(s) within the areas targeted by this assay, and inadequate number of viral copies(<138 copies/mL). A negative result must be combined with clinical observations, patient history, and epidemiological information. The expected result is Negative.  Fact Sheet for Patients:  EntrepreneurPulse.com.au  Fact Sheet for Healthcare Providers:  IncredibleEmployment.be  This test is no t yet approved or cleared by the [place] FDA and  has been authorized for detection and/or diagnosis of SARS-CoV-2 by FDA under an Emergency Use Authorization (EUA). This EUA will remain  in effect (meaning this test can be used) for the duration of the COVID-19 declaration under Section 564(b)(1) of the Act, 21 U.S.C.section 360bbb-3(b)(1), unless the authorization is terminated  or revoked sooner.       Influenza A by PCR NEGATIVE NEGATIVE Final   Influenza B by PCR NEGATIVE NEGATIVE Final    Comment: (NOTE) The Xpert Xpress SARS-CoV-2/FLU/RSV plus assay is intended as an aid in the diagnosis of influenza from Nasopharyngeal  swab specimens and should not be used as a sole basis for treatment. Nasal washings and aspirates are unacceptable for Xpert Xpress SARS-CoV-2/FLU/RSV testing.  Fact Sheet for Patients: EntrepreneurPulse.com.au  Fact Sheet for Healthcare Providers: IncredibleEmployment.be  This test is not yet approved or cleared by the [place] FDA and has been authorized for detection and/or diagnosis of SARS-CoV-2 by FDA under an Emergency Use Authorization (EUA). This EUA will remain in effect (meaning this test can be used) for the duration of the COVID-19 declaration under Section 564(b)(1) of the Act, 21 U.S.C. section 360bbb-3(b)(1), unless the authorization is terminated or revoked.     Resp Syncytial Virus by PCR NEGATIVE NEGATIVE Final    Comment: (NOTE) Fact Sheet for Patients: EntrepreneurPulse.com.au  Fact Sheet for Healthcare Providers: IncredibleEmployment.be  This test is not yet approved or cleared by the [place] FDA and has been authorized for detection and/or diagnosis of SARS-CoV-2 by FDA under an Emergency Use Authorization (EUA). This EUA will remain in effect (meaning this test can be used) for the duration of the COVID-19 declaration under Section 564(b)(1) of the Act, 21 U.S.C. section 360bbb-3(b)(1), unless the authorization is terminated or revoked.  Performed at [facility], [address] [place], [place] [address]   Gastrointestinal Panel by PCR , Stool     Status: None   Collection Time: 05/28/22 12:10 PM   Specimen: Stool  Result Value Ref Range Status   Campylobacter species NOT DETECTED NOT DETECTED Final   Plesimonas shigelloides NOT DETECTED NOT DETECTED Final   Salmonella species NOT DETECTED NOT DETECTED Final   Yersinia enterocolitica NOT DETECTED NOT DETECTED Final   Vibrio species NOT DETECTED NOT DETECTED Final   Vibrio cholerae NOT DETECTED NOT  DETECTED Final   Enteroaggregative E coli (EAEC) NOT DETECTED NOT DETECTED Final   Enteropathogenic E coli (EPEC) NOT DETECTED NOT DETECTED Final   Enterotoxigenic E coli (ETEC) NOT DETECTED NOT DETECTED Final   Shiga like toxin producing E coli (STEC) NOT DETECTED NOT DETECTED Final   Shigella/Enteroinvasive E coli (EIEC) NOT DETECTED NOT DETECTED Final   Cryptosporidium NOT DETECTED NOT DETECTED Final   Cyclospora cayetanensis NOT DETECTED NOT DETECTED Final   Entamoeba histolytica NOT DETECTED NOT DETECTED Final   Giardia lamblia NOT DETECTED NOT DETECTED Final   Adenovirus F40/41 NOT DETECTED NOT DETECTED Final   Astrovirus NOT DETECTED NOT DETECTED Final   Norovirus GI/GII NOT DETECTED NOT DETECTED Final  Rotavirus A NOT DETECTED NOT DETECTED Final   Sapovirus (I, II, IV, and V) NOT DETECTED NOT DETECTED Final    Comment: Performed at [facility], [address] [place], [place] [address]  C Difficile Quick Screen w PCR reflex     Status: None   Collection Time: 05/28/22 12:10 PM   Specimen: Stool  Result Value Ref Range Status   C Diff antigen NEGATIVE NEGATIVE Final   C Diff toxin NEGATIVE NEGATIVE Final   C Diff interpretation No C. difficile detected.  Final    Comment: Performed at [facility], [address], [place], [place] [address]    Time coordinating discharge:  25 minutes.  SIGNED:  Ivor Costa, MD [facility] 05/29/2022, 6:19 PM   If 7PM-7AM, please contact night-coverage [email or web address]

## 2022-06-01 ENCOUNTER — Encounter
Admission: RE | Admit: 2022-06-01 | Discharge: 2022-06-01 | Disposition: A | Discharge status: Home / Self Care | Payer: Medicare Other | Source: Ambulatory Visit | Attending: Vascular Surgery | Admitting: Vascular Surgery

## 2022-06-01 ENCOUNTER — Telehealth: Payer: Self-pay | Admitting: *Deleted

## 2022-06-01 ENCOUNTER — Encounter: Payer: Self-pay | Admitting: Vascular Surgery

## 2022-06-01 ENCOUNTER — Other Ambulatory Visit: Payer: Self-pay

## 2022-06-01 HISTORY — DX: Dependence on renal dialysis: Z99.2

## 2022-06-01 HISTORY — DX: Unspecified viral hepatitis C without hepatic coma: B19.20

## 2022-06-01 HISTORY — DX: End stage renal disease: N18.6

## 2022-06-01 HISTORY — DX: Gastro-esophageal reflux disease without esophagitis: K21.9

## 2022-06-01 HISTORY — DX: COVID-19: U07.1

## 2022-06-01 LAB — STOOL CULTURE: E coli, Shiga toxin Assay: NEGATIVE

## 2022-06-01 LAB — STOOL CULTURE REFLEX - CMPCXR

## 2022-06-01 LAB — STOOL CULTURE REFLEX - RSASHR

## 2022-06-01 NOTE — Pre-Procedure Instructions (Signed)
Pre op phone interview performed. Patient recently in ED 05/28/22 for several days of NVD. While in ED spiked fever 103. Patient left AMA from ED w/ 103 fever. Patient states she is feeling much better at present time. No fevers since leaving ED. Denies NVD at this time.

## 2022-06-01 NOTE — Patient Instructions (Signed)
Your procedure is scheduled on: 06/05/22 Report to [place]. To find out your arrival time please call [phone] between 1PM - 3PM on 06/04/22.  Remember: Instructions that are not followed completely may result in serious medical risk, up to and including death, or upon the discretion of your surgeon and anesthesiologist your surgery may need to be rescheduled.     _X__ 1. Do not eat food or drink any liquids after midnight the night before your procedure.                 No gum chewing or hard candies.   __X__2.  On the morning of surgery brush your teeth with toothpaste and water, you                 may rinse your mouth with mouthwash if you wish.  Do not swallow any              toothpaste of mouthwash.     _X__ 3.  No Alcohol for 24 hours before or after surgery.   _X__ 4.  Do Not Smoke or use e-cigarettes For 24 Hours Prior to Your Surgery.                 Do not use any chewable tobacco products for at least 6 hours prior to                 surgery.  ____  5.  Bring all medications with you on the day of surgery if instructed.   __X__  6.  Notify your doctor if there is any change in your medical condition      (cold, fever, infections).     Do not wear jewelry, make-up, hairpins, clips or nail polish. Do not wear lotions, powders, or perfumes. No deodorant Do not shave body hair 48 hours prior to surgery. Men may shave face and neck. Do not bring valuables to the hospital.    [facility] is not responsible for any belongings or valuables.  Contacts, dentures/partials or body piercings may not be worn into surgery. Bring a case for your contacts, glasses or hearing aids, a denture cup will be supplied. Leave your suitcase in the car. After surgery it may be brought to your room. For patients admitted to the hospital, discharge time is determined by your treatment team.   Patients discharged the day of surgery will  not be allowed to drive home.    __X__ Take these medicines the morning of surgery with A SIP OF WATER:    1. carvedilol (COREG) 12.5 MG tablet   2. dapsone 100 MG tablet   3. pantoprazole (PROTONIX) 40 MG tablet   4.  5.  6.  ____ Fleet Enema (as directed)   ____ Use CHG Soap/SAGE wipes as directed  ____ Use inhalers on the day of surgery  ____ Stop metformin/Janumet/Farxiga 2 days prior to surgery    ____ Take 1/2 of usual insulin dose the night before surgery. No insulin the morning          of surgery.   __X__ Stop Blood Thinners. hOLD FOR 3 DAYS. LAST DOSE TUESDAY AM.  __X__ Stop Anti-inflammatories 7 days before surgery such as Advil, Ibuprofen, Motrin,  BC or Goodies Powder, Naprosyn, Naproxen, Aleve, Aspirin    __X__ Stop all herbals and supplements, fish oil or vitamins  until after surgery.    ____ Bring C-Pap to  the hospital.

## 2022-06-01 NOTE — Telephone Encounter (Signed)
   Patient Name: Rebecca Bridges  DOB: [birth year] MRN: [id number]  Primary Cardiologist: Ida Rogue, MD  Chart reviewed as part of pre-operative protocol coverage. Patient is scheduled to have a revision of AV fistula on 06/05/2022. However, it looks like patient follows with Dr. Marcello Moores at [facility].  Therefore, will defer pre-op risk assessment and recommendations for holding Eliquis to them. Please reach out to Dr. Manon Hilding office.  I will route this recommendation to the requesting party via Epic fax function and remove from pre-op pool.  Please call with questions.  Darreld Mclean, PA-C 06/01/2022, 3:37 PM

## 2022-06-01 NOTE — Telephone Encounter (Signed)
-----   Message from Karen Kitchens, NP sent at 06/01/2022  3:13 PM EST ----- Regarding: Request for pre-operative cardiac clearance Request for pre-operative cardiac clearance:  1. What type of surgery is being performed?  REVISON OF ARTERIOVENOUS FISTULA   2. When is this surgery scheduled?  06/05/2022  3. Type of clearance being requested (medical, pharmacy, both)? BOTH   4. Are there any medications that need to be held prior to surgery? APIXABAN  5. Practice name and name of physician performing surgery?  Performing surgeon: Dr. Hortencia Pilar, MD Requesting clearance: Honor Loh, FNP-C    6. Anesthesia type (none, local, MAC, general)? GENERAL  7. What is the office phone and fax number?   Phone: [phone] Fax: [phone]  ATTENTION: Unable to create telephone message as per your standard workflow. Directed by [facility] providers to send requests for cardiac clearance to this pool for appropriate distribution to provider covering pre-operative clearances.   Honor Loh, MSN, APRN, FNP-C, CEN [facility]  Peri-operative Services Nurse Practitioner Phone: [phone] 06/01/22 3:13 PM

## 2022-06-02 NOTE — Progress Notes (Addendum)
Perioperative Services Pre-Admission/Anesthesia Testing    Date: 06/01/22  Name: Rebecca Bridges MRN:   [id number]  Re: Surgery cancellation due to recent illness and need for cardiology/EP input  Planned Surgical Procedure(s):    Case: [id number] Date/Time: 06/05/22 0715   Procedure: REVISION OF ARTERIOVENOUS FISTULA (Left: Wrist)   Anesthesia type: General   Pre-op diagnosis: ESRD   Location: [facility] OR ROOM 08 / [facility] FOR ANESTHESIA GROUP   Surgeons: Katha Cabal, MD   Clinical Notes:  Patient is scheduled for the above procedure on 06/05/2022 with Dr. Hortencia Pilar, MD. Of note, patient recently in the ED with a diarrheal illness and FUO (Tmax 103.1). Patient was admitted for HD and further evaluation of her symptoms, however signed out AMA on the same day (05/28/2022) after being dialyzed. Workup was not able to be completed prior to patient leaving.   The above information was communicated to Dr. Delana Meyer last week. MD with plans to postpone case pending further evaluation for possible sepsis possibly related to her dialysis catheter. PAT experienced great difficulties in communicating with the patient. She finally returned call today (06/01/2022), at which time the interview was completed by PAT RN. Patient indicated during the interview that fever had resolved and that she had not experienced any further symptoms since leaving from the hospital on the 14th. Of note, PAT RN conducting interview today was unaware of plans to postpone case, as conversation between PAT RN and vascular regarding plans to postpone case was not documented last week.   Reached out to vascular APP today to discuss further. Briefly reviewed case and details surrounding recent ED visit. Additionally, I discussed my concerns with regards to her cardiovascular history. 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 I learned today that she is now under the care of provider at [facility]  for consideration of possible AICD, as her current EF of <15% and ECG changes (LBBB + wide QRS) place her at increased risk for sudden cardiac death. EP indicated that he was unsure if device was the right decision for patient, as infection would be a "significant setback" in the setting of her other multiple medical comorbidities. This was back in 11/2021, at which time plans were to discuss patient at [facility] cardiology case conference. I do not see any additional notes since that time. Vascular was made aware that I have reached out to cardiology/EP for clearance and recommendations regarding the planned vascular procedure.   Impression and Plan:  Rebecca Bridges with recent diarrheal illness and post-dialysis FUO. Unfortunately, patient signed out AMA prior to completion of her workup. In speaking with vascular, Owens Shark, NP-C confirmed that initial plan was to postpone pending further in office evaluation. We dicussed that the patient is now saying that she is better and wishes to proceed. Case was discussed with vascular surgeon Delana Meyer, MD) and the plan remains to see the patient in office first prior to proceeding with her graft revision.   Patient will require this procedure in the future. Date TBD at this point. In the interim, I will continue to work on obtaining clearance and recommendations for the planned procedure from cardiology/EP. Office to contact patient to make her aware of procedure cancellation and plans for RTC visit with surgeon and/or APP prior to rescheduling of her procedure.   Honor Loh, MSN, APRN, FNP-C, CEN [facility]  Peri-operative Services Nurse Practitioner Phone: [phone] 06/02/22 7:38 AM  NOTE: This note has been prepared using Dragon  dictation software. Despite my best ability to proofread, there is always the potential that unintentional transcriptional errors may still occur from this process.

## 2022-06-04 LAB — GIARDIA, EIA; OVA/PARASITE: Giardia Ag, Stl: NEGATIVE

## 2022-06-04 LAB — O&P RESULT

## 2022-06-05 HISTORY — DX: Liver disease, unspecified: K76.9

## 2022-06-16 NOTE — Progress Notes (Signed)
Called Dr Marcello Moores (cardio) at [facility] to find out status of clearance. B Pearline Cables had faxed multiple times to cardiology office back in Dec and never received clearance back. I called today and spoke with Loma Sousa who said they had not received anything. She gave me direct # to fax clearance form to. She said she will route this conversation to [place] in their office but is unsure if they can get pt cleared before her surgery on 1-5. Loma Sousa was given direct # to PAT so she can update regarding clearance

## 2022-06-18 ENCOUNTER — Telehealth (INDEPENDENT_AMBULATORY_CARE_PROVIDER_SITE_OTHER): Payer: Self-pay

## 2022-06-18 NOTE — Pre-Procedure Instructions (Addendum)
Pts surgery cancelled per anesthesia due to pt needing cardiac clearance.Dr Delana Meyer and staff aware of this Dr Mylinda Latina (EP) does not provide cardiac clearance per office. I called pt to see which cardiologist she sees and she said that she sees Dr Tammi Klippel at [facility]. I informed pt that we will fax cardiac clearance to Dr Dorathy Kinsman office and they will get in touch with her for clearance so she can have her av fistula revision with Dr Delana Meyer. Pt verbalized understanding. Called Dr Dorathy Kinsman office and spoke with nurse who gave me the correct fax # so I can get this clearance faxed. Faxed cardiac clearance over to Dr Dorathy Kinsman office

## 2022-06-18 NOTE — Pre-Procedure Instructions (Signed)
Called back to [facility] and spoke with nurse there about the status of pts clearance. They state that Dr Marcello Moores does not provide clearance. Will relay this message to Dr Delana Meyer and Dr Rosey Bath to see if pt can still proceed with her av fistula revision tomorrow

## 2022-06-18 NOTE — Telephone Encounter (Signed)
Spoke with the patient and advised about the fact that she is to have cardiac clearance before she can have her surgery. Patient understood and will advise [place] once she has seen her cardiologist.

## 2022-06-19 ENCOUNTER — Encounter: Admission: RE | Payer: Self-pay | Source: Home / Self Care

## 2022-06-19 ENCOUNTER — Ambulatory Visit: Admission: RE | Admit: 2022-06-19 | Payer: Medicare Other | Source: Home / Self Care | Admitting: Vascular Surgery

## 2022-06-19 HISTORY — DX: Insomnia, unspecified: G47.00

## 2022-06-19 HISTORY — DX: Unspecified cirrhosis of liver: K74.60

## 2022-06-19 HISTORY — DX: Chronic obstructive pulmonary disease, unspecified: J44.9

## 2022-06-19 HISTORY — DX: Long term (current) use of anticoagulants: Z79.01

## 2022-06-19 HISTORY — DX: Dilated cardiomyopathy: I42.0

## 2022-06-19 HISTORY — DX: Patient's noncompliance with other medical treatment and regimen due to unspecified reason: Z91.199

## 2022-06-19 HISTORY — DX: Abnormal electrocardiogram (ECG) (EKG): R94.31

## 2022-06-19 HISTORY — DX: Splenomegaly, not elsewhere classified: R16.1

## 2022-06-19 SURGERY — REVISON OF ARTERIOVENOUS FISTULA
Anesthesia: General | Site: Wrist | Laterality: Left

## 2022-09-23 ENCOUNTER — Telehealth (INDEPENDENT_AMBULATORY_CARE_PROVIDER_SITE_OTHER): Payer: Self-pay

## 2022-09-23 ENCOUNTER — Other Ambulatory Visit: Payer: Self-pay | Admitting: Internal Medicine

## 2022-09-23 DIAGNOSIS — R188 Other ascites: Secondary | ICD-10-CM

## 2022-09-23 NOTE — Telephone Encounter (Signed)
I attempted to contact the patient to schedule a revision of left wrist fistula with Dr. Gilda Crease. A message was left for a return call.

## 2022-09-28 ENCOUNTER — Ambulatory Visit
Admission: RE | Admit: 2022-09-28 | Discharge: 2022-09-28 | Disposition: A | Discharge status: Home / Self Care | Payer: 59 | Source: Ambulatory Visit | Attending: Internal Medicine | Admitting: Internal Medicine

## 2022-09-28 ENCOUNTER — Telehealth (INDEPENDENT_AMBULATORY_CARE_PROVIDER_SITE_OTHER): Payer: Self-pay

## 2022-09-28 DIAGNOSIS — R188 Other ascites: Secondary | ICD-10-CM | POA: Insufficient documentation

## 2022-09-28 MED ORDER — LIDOCAINE HCL (PF) 1 % IJ SOLN
10.0000 mL | Freq: Once | INTRAMUSCULAR | Status: AC
  Administered 2022-09-28: 10 mL via INTRADERMAL

## 2022-09-28 NOTE — Telephone Encounter (Unsigned)
Patient is scheduled with Dr. Gilda Crease to revise a left wrist fistula on 10/14/22 at [facility]. Pre-op is on 10/06/22 at 11:00 am. Pre-surgical instructions were discussed and will be handed to the patient as she will come in and pick up her instructions.

## 2022-09-28 NOTE — Procedures (Signed)
PROCEDURE SUMMARY:  Successful US guided therapeutic paracentesis from LLQ.  Yielded 1 L of serosanguineous fluid.  No immediate complications.  Pt tolerated well.   Specimen not sent for labs.  EBL < 1 mL  Shon Hough, AGNP 09/28/2022 11:37 AM

## 2022-10-04 ENCOUNTER — Other Ambulatory Visit: Payer: Self-pay

## 2022-10-04 DIAGNOSIS — Z8616 Personal history of COVID-19: Secondary | ICD-10-CM | POA: Diagnosis not present

## 2022-10-04 DIAGNOSIS — Z86711 Personal history of pulmonary embolism: Secondary | ICD-10-CM | POA: Insufficient documentation

## 2022-10-04 DIAGNOSIS — J449 Chronic obstructive pulmonary disease, unspecified: Secondary | ICD-10-CM | POA: Diagnosis not present

## 2022-10-04 DIAGNOSIS — I132 Hypertensive heart and chronic kidney disease with heart failure and with stage 5 chronic kidney disease, or end stage renal disease: Secondary | ICD-10-CM | POA: Insufficient documentation

## 2022-10-04 DIAGNOSIS — R600 Localized edema: Secondary | ICD-10-CM | POA: Diagnosis not present

## 2022-10-04 DIAGNOSIS — F17210 Nicotine dependence, cigarettes, uncomplicated: Secondary | ICD-10-CM | POA: Diagnosis not present

## 2022-10-04 DIAGNOSIS — R140 Abdominal distension (gaseous): Secondary | ICD-10-CM | POA: Diagnosis present

## 2022-10-04 DIAGNOSIS — I2510 Atherosclerotic heart disease of native coronary artery without angina pectoris: Secondary | ICD-10-CM | POA: Diagnosis not present

## 2022-10-04 DIAGNOSIS — I5022 Chronic systolic (congestive) heart failure: Secondary | ICD-10-CM | POA: Insufficient documentation

## 2022-10-04 DIAGNOSIS — Z992 Dependence on renal dialysis: Secondary | ICD-10-CM | POA: Insufficient documentation

## 2022-10-04 DIAGNOSIS — Z7901 Long term (current) use of anticoagulants: Secondary | ICD-10-CM | POA: Diagnosis not present

## 2022-10-04 DIAGNOSIS — Z79899 Other long term (current) drug therapy: Secondary | ICD-10-CM | POA: Diagnosis not present

## 2022-10-04 DIAGNOSIS — N186 End stage renal disease: Secondary | ICD-10-CM | POA: Insufficient documentation

## 2022-10-04 DIAGNOSIS — R188 Other ascites: Secondary | ICD-10-CM | POA: Diagnosis not present

## 2022-10-04 DIAGNOSIS — B20 Human immunodeficiency virus [HIV] disease: Secondary | ICD-10-CM | POA: Diagnosis not present

## 2022-10-04 LAB — CBC WITH DIFFERENTIAL/PLATELET
Abs Immature Granulocytes: 0.01 10*3/uL (ref 0.00–0.07)
Basophils Absolute: 0 10*3/uL (ref 0.0–0.1)
Basophils Relative: 1 %
Eosinophils Absolute: 0.1 10*3/uL (ref 0.0–0.5)
Eosinophils Relative: 3 %
HCT: 38.7 % (ref 36.0–46.0)
Hemoglobin: 11.9 g/dL — ABNORMAL LOW (ref 12.0–15.0)
Immature Granulocytes: 0 %
Lymphocytes Relative: 15 %
Lymphs Abs: 0.6 10*3/uL — ABNORMAL LOW (ref 0.7–4.0)
MCH: 34.8 pg — ABNORMAL HIGH (ref 26.0–34.0)
MCHC: 30.7 g/dL (ref 30.0–36.0)
MCV: 113.2 fL — ABNORMAL HIGH (ref 80.0–100.0)
Monocytes Absolute: 0.3 10*3/uL (ref 0.1–1.0)
Monocytes Relative: 8 %
Neutro Abs: 2.7 10*3/uL (ref 1.7–7.7)
Neutrophils Relative %: 73 %
Platelets: 74 10*3/uL — ABNORMAL LOW (ref 150–400)
RBC: 3.42 MIL/uL — ABNORMAL LOW (ref 3.87–5.11)
RDW: 19.9 % — ABNORMAL HIGH (ref 11.5–15.5)
WBC: 3.7 10*3/uL — ABNORMAL LOW (ref 4.0–10.5)
nRBC: 0 % (ref 0.0–0.2)

## 2022-10-04 LAB — BASIC METABOLIC PANEL
Anion gap: 9 (ref 5–15)
BUN: 44 mg/dL — ABNORMAL HIGH (ref 6–20)
CO2: 25 mmol/L (ref 22–32)
Calcium: 8.4 mg/dL — ABNORMAL LOW (ref 8.9–10.3)
Chloride: 104 mmol/L (ref 98–111)
Creatinine, Ser: 8.46 mg/dL — ABNORMAL HIGH (ref 0.44–1.00)
GFR, Estimated: 5 mL/min — ABNORMAL LOW (ref >60)
Glucose, Bld: 100 mg/dL — ABNORMAL HIGH (ref 70–99)
Potassium: 4.9 mmol/L (ref 3.5–5.1)
Sodium: 138 mmol/L (ref 135–145)

## 2022-10-04 NOTE — ED Triage Notes (Addendum)
Pt sts that she had a paracentesis on Monday 4/15. Sts that she feels like her abd is swollen again and her legs are swelling as well. Pt also had a paracentesis on 4/12 at [facility] as well. Pt sts that she is also in dialysis and her last treatment was Thursday as she missed her Saturday appointment d/t being out of town

## 2022-10-05 ENCOUNTER — Observation Stay: Payer: 59

## 2022-10-05 ENCOUNTER — Observation Stay
Admission: EM | Admit: 2022-10-05 | Discharge: 2022-10-06 | Disposition: A | Discharge status: Home / Self Care | Payer: 59 | Attending: Osteopathic Medicine | Admitting: Osteopathic Medicine

## 2022-10-05 ENCOUNTER — Encounter: Payer: Self-pay | Admitting: Internal Medicine

## 2022-10-05 ENCOUNTER — Emergency Department: Payer: 59

## 2022-10-05 DIAGNOSIS — N186 End stage renal disease: Secondary | ICD-10-CM | POA: Diagnosis not present

## 2022-10-05 DIAGNOSIS — E8770 Fluid overload, unspecified: Secondary | ICD-10-CM | POA: Diagnosis not present

## 2022-10-05 DIAGNOSIS — J90 Pleural effusion, not elsewhere classified: Secondary | ICD-10-CM

## 2022-10-05 DIAGNOSIS — I5022 Chronic systolic (congestive) heart failure: Secondary | ICD-10-CM | POA: Diagnosis present

## 2022-10-05 DIAGNOSIS — K7460 Unspecified cirrhosis of liver: Secondary | ICD-10-CM | POA: Diagnosis present

## 2022-10-05 DIAGNOSIS — I2699 Other pulmonary embolism without acute cor pulmonale: Secondary | ICD-10-CM | POA: Diagnosis present

## 2022-10-05 DIAGNOSIS — Z21 Asymptomatic human immunodeficiency virus [HIV] infection status: Secondary | ICD-10-CM | POA: Diagnosis present

## 2022-10-05 DIAGNOSIS — I10 Essential (primary) hypertension: Secondary | ICD-10-CM | POA: Diagnosis present

## 2022-10-05 DIAGNOSIS — I743 Embolism and thrombosis of arteries of the lower extremities: Secondary | ICD-10-CM | POA: Diagnosis present

## 2022-10-05 DIAGNOSIS — J449 Chronic obstructive pulmonary disease, unspecified: Secondary | ICD-10-CM | POA: Diagnosis not present

## 2022-10-05 DIAGNOSIS — R9431 Abnormal electrocardiogram [ECG] [EKG]: Secondary | ICD-10-CM | POA: Diagnosis present

## 2022-10-05 DIAGNOSIS — R140 Abdominal distension (gaseous): Secondary | ICD-10-CM | POA: Diagnosis not present

## 2022-10-05 DIAGNOSIS — R600 Localized edema: Secondary | ICD-10-CM

## 2022-10-05 DIAGNOSIS — R0602 Shortness of breath: Secondary | ICD-10-CM

## 2022-10-05 DIAGNOSIS — Z720 Tobacco use: Secondary | ICD-10-CM | POA: Diagnosis present

## 2022-10-05 DIAGNOSIS — Z992 Dependence on renal dialysis: Secondary | ICD-10-CM

## 2022-10-05 DIAGNOSIS — D61818 Other pancytopenia: Secondary | ICD-10-CM | POA: Diagnosis present

## 2022-10-05 DIAGNOSIS — B1920 Unspecified viral hepatitis C without hepatic coma: Secondary | ICD-10-CM | POA: Diagnosis present

## 2022-10-05 DIAGNOSIS — R188 Other ascites: Secondary | ICD-10-CM

## 2022-10-05 DIAGNOSIS — B20 Human immunodeficiency virus [HIV] disease: Secondary | ICD-10-CM | POA: Diagnosis present

## 2022-10-05 LAB — GLUCOSE, PLEURAL OR PERITONEAL FLUID: Glucose, Fluid: 93 mg/dL

## 2022-10-05 LAB — LACTATE DEHYDROGENASE: LDH: 198 U/L — ABNORMAL HIGH (ref 98–192)

## 2022-10-05 LAB — HEPATIC FUNCTION PANEL
ALT: 9 U/L (ref 0–44)
AST: 25 U/L (ref 15–41)
Albumin: 3.4 g/dL — ABNORMAL LOW (ref 3.5–5.0)
Alkaline Phosphatase: 56 U/L (ref 38–126)
Bilirubin, Direct: 0.3 mg/dL — ABNORMAL HIGH (ref 0.0–0.2)
Indirect Bilirubin: 0.8 mg/dL (ref 0.3–0.9)
Total Bilirubin: 1.1 mg/dL (ref 0.3–1.2)
Total Protein: 8 g/dL (ref 6.5–8.1)

## 2022-10-05 LAB — TROPONIN I (HIGH SENSITIVITY): Troponin I (High Sensitivity): 53 ng/L — ABNORMAL HIGH (ref <18)

## 2022-10-05 LAB — BODY FLUID CULTURE W GRAM STAIN

## 2022-10-05 LAB — BODY FLUID CELL COUNT WITH DIFFERENTIAL
Eos, Fluid: 2 %
Lymphs, Fluid: 56 %
Monocyte-Macrophage-Serous Fluid: 22 %
Neutrophil Count, Fluid: 20 %
Total Nucleated Cell Count, Fluid: 847 cu mm

## 2022-10-05 LAB — PROTIME-INR
INR: 1.5 — ABNORMAL HIGH (ref 0.8–1.2)
Prothrombin Time: 18.3 seconds — ABNORMAL HIGH (ref 11.4–15.2)

## 2022-10-05 LAB — LACTATE DEHYDROGENASE, PLEURAL OR PERITONEAL FLUID: LD, Fluid: 85 U/L — ABNORMAL HIGH (ref 3–23)

## 2022-10-05 LAB — ALBUMIN, PLEURAL OR PERITONEAL FLUID: Albumin, Fluid: 2.3 g/dL

## 2022-10-05 LAB — PROTEIN, PLEURAL OR PERITONEAL FLUID: Total protein, fluid: 4.6 g/dL

## 2022-10-05 LAB — CBC
HCT: 37.4 % (ref 36.0–46.0)
Hemoglobin: 11.8 g/dL — ABNORMAL LOW (ref 12.0–15.0)
MCH: 34.9 pg — ABNORMAL HIGH (ref 26.0–34.0)
MCHC: 31.6 g/dL (ref 30.0–36.0)
MCV: 110.7 fL — ABNORMAL HIGH (ref 80.0–100.0)
Platelets: 77 10*3/uL — ABNORMAL LOW (ref 150–400)
RBC: 3.38 MIL/uL — ABNORMAL LOW (ref 3.87–5.11)
RDW: 19.8 % — ABNORMAL HIGH (ref 11.5–15.5)
WBC: 3.8 10*3/uL — ABNORMAL LOW (ref 4.0–10.5)
nRBC: 0 % (ref 0.0–0.2)

## 2022-10-05 LAB — APTT: aPTT: 40 seconds — ABNORMAL HIGH (ref 24–36)

## 2022-10-05 LAB — BRAIN NATRIURETIC PEPTIDE: B Natriuretic Peptide: >4500 pg/mL — ABNORMAL HIGH (ref 0.0–100.0)

## 2022-10-05 LAB — HEPATITIS B SURFACE ANTIGEN: Hepatitis B Surface Ag: NONREACTIVE

## 2022-10-05 LAB — AMMONIA: Ammonia: 19 umol/L (ref 9–35)

## 2022-10-05 MED ORDER — TRAZODONE HCL 50 MG PO TABS
50.0000 mg | ORAL_TABLET | Freq: Every evening | ORAL | Status: DC | PRN
  Administered 2022-10-05: 50 mg via ORAL
  Filled 2022-10-05: qty 1

## 2022-10-05 MED ORDER — PANTOPRAZOLE SODIUM 40 MG PO TBEC
40.0000 mg | DELAYED_RELEASE_TABLET | Freq: Every day | ORAL | Status: DC
  Administered 2022-10-05: 40 mg via ORAL
  Filled 2022-10-05: qty 1

## 2022-10-05 MED ORDER — BICTEGRAVIR-EMTRICITAB-TENOFOV 50-200-25 MG PO TABS
1.0000 | ORAL_TABLET | Freq: Every day | ORAL | Status: DC
  Administered 2022-10-05: 1 via ORAL
  Filled 2022-10-05 (×2): qty 1

## 2022-10-05 MED ORDER — GABAPENTIN 300 MG PO CAPS
300.0000 mg | ORAL_CAPSULE | Freq: Every day | ORAL | Status: DC
  Administered 2022-10-05: 300 mg via ORAL
  Filled 2022-10-05: qty 1

## 2022-10-05 MED ORDER — APIXABAN 5 MG PO TABS
5.0000 mg | ORAL_TABLET | Freq: Two times a day (BID) | ORAL | Status: DC
  Administered 2022-10-05: 5 mg via ORAL
  Filled 2022-10-05 (×2): qty 1

## 2022-10-05 MED ORDER — MAGNESIUM HYDROXIDE 400 MG/5ML PO SUSP: 30.0000 mL | Freq: Every day | ORAL | Status: DC | PRN

## 2022-10-05 MED ORDER — CALCIUM ACETATE (PHOS BINDER) 667 MG PO CAPS: 2001.0000 mg | ORAL_CAPSULE | Freq: Three times a day (TID) | ORAL | Status: DC

## 2022-10-05 MED ORDER — ACETAMINOPHEN 650 MG RE SUPP: 650.0000 mg | Freq: Four times a day (QID) | RECTAL | Status: DC | PRN

## 2022-10-05 MED ORDER — ALBUTEROL SULFATE (2.5 MG/3ML) 0.083% IN NEBU: 3.0000 mL | INHALATION_SOLUTION | RESPIRATORY_TRACT | Status: DC | PRN

## 2022-10-05 MED ORDER — RENA-VITE PO TABS
1.0000 | ORAL_TABLET | Freq: Every day | ORAL | Status: DC
  Filled 2022-10-05: qty 1

## 2022-10-05 MED ORDER — ACETAMINOPHEN 325 MG PO TABS
650.0000 mg | ORAL_TABLET | Freq: Four times a day (QID) | ORAL | Status: DC | PRN
  Administered 2022-10-05: 650 mg via ORAL
  Filled 2022-10-05: qty 2

## 2022-10-05 MED ORDER — HYDRALAZINE HCL 20 MG/ML IJ SOLN: 5.0000 mg | INTRAMUSCULAR | Status: DC | PRN

## 2022-10-05 MED ORDER — DIPHENHYDRAMINE HCL 50 MG/ML IJ SOLN: 12.5000 mg | Freq: Three times a day (TID) | INTRAMUSCULAR | Status: DC | PRN

## 2022-10-05 MED ORDER — LIDOCAINE HCL (PF) 1 % IJ SOLN
10.0000 mL | Freq: Once | INTRAMUSCULAR | Status: AC
  Administered 2022-10-05: 10 mL via INTRADERMAL

## 2022-10-05 MED ORDER — FUROSEMIDE 40 MG PO TABS
80.0000 mg | ORAL_TABLET | Freq: Two times a day (BID) | ORAL | Status: DC
  Administered 2022-10-05: 80 mg via ORAL
  Filled 2022-10-05: qty 2

## 2022-10-05 MED ORDER — ALBUMIN HUMAN 25 % IV SOLN
12.5000 g | Freq: Once | INTRAVENOUS | Status: AC
  Administered 2022-10-05: 12.5 g via INTRAVENOUS
  Filled 2022-10-05: qty 50

## 2022-10-05 MED ORDER — CHLORHEXIDINE GLUCONATE CLOTH 2 % EX PADS
6.0000 | MEDICATED_PAD | Freq: Every day | CUTANEOUS | Status: DC
  Administered 2022-10-06: 6 via TOPICAL
  Filled 2022-10-05: qty 6

## 2022-10-05 MED ORDER — DM-GUAIFENESIN ER 30-600 MG PO TB12: 1.0000 | ORAL_TABLET | Freq: Two times a day (BID) | ORAL | Status: DC | PRN

## 2022-10-05 MED ORDER — SOFOSBUVIR-VELPATASVIR 400-100 MG PO TABS: 1.0000 | ORAL_TABLET | Freq: Every day | ORAL | Status: DC

## 2022-10-05 MED ORDER — NICOTINE 21 MG/24HR TD PT24: 21.0000 mg | MEDICATED_PATCH | Freq: Every day | TRANSDERMAL | Status: DC

## 2022-10-05 MED ORDER — CARVEDILOL 12.5 MG PO TABS
12.5000 mg | ORAL_TABLET | Freq: Two times a day (BID) | ORAL | Status: DC
  Administered 2022-10-05: 12.5 mg via ORAL
  Filled 2022-10-05: qty 1

## 2022-10-05 NOTE — Progress Notes (Signed)
[facility]  ROUNDING NOTE   Subjective:   Rebecca Bridges is a 47 y.o. female with past medical history of hypertension, HIV, HCV, tobacco abuse, pancytopenia, end-stage renal disease on hemodialysis.  Patient presents to the emergency department with leg swelling. She has been admitted under observation for Fluid overload [E87.70]  Patient is known to our practice and receives outpatient dialysis treatments at [facility] on a TTS schedule, supervised by Dr. Thedore Mins.  Last treatment received on Thursday.  According to outpatient records, patient missed 2 dialysis treatments last week.  Patient states she initially felt fine, was able to go out of town for the weekend.  Patient states she started feeling abdominal distention on the ride back.  States her abdomen is more swollen than usual along with legs.  Denies nausea, vomiting, or diarrhea.  Reports shortness of breath on exertion.`  Labs on ED arrival include BUN 44, creatinine 8.46 with GFR 5, albumin 3.4, BNP greater than 4500, troponin 53, WBCs 3.7 and hemoglobin 11.9.  Chest x-ray shows small left pleural effusion.  We have been consulted to manage dialysis needs during this admission.  Objective:  Vital signs in last 24 hours:  Temp:  [97.5 F (36.4 C)-97.9 F (36.6 C)] 97.5 F (36.4 C) (04/22 0739) Pulse Rate:  [65-85] 85 (04/22 1030) Resp:  [16-27] 20 (04/22 0845) BP: (118-137)/(83-106) 137/106 (04/22 1030) SpO2:  [96 %-100 %] 99 % (04/22 1030)  Weight change:  There were no vitals filed for this visit.  Intake/Output: No intake/output data recorded.   Intake/Output this shift:  No intake/output data recorded.  Physical Exam: General: NAD  Head: Normocephalic, atraumatic. Moist oral mucosal membranes  Eyes: Anicteric  Lungs:  Rhonchi  Heart: Regular rate and rhythm  Abdomen:  Soft, nontender mild distention  Extremities:  No peripheral edema.  Neurologic: Nonfocal, moving all four extremities  Skin: No  lesions  Access: Rt permcath    Basic Metabolic Panel: Recent Labs  Lab 10/04/22 2202  NA 138  K 4.9  CL 104  CO2 25  GLUCOSE 100*  BUN 44*  CREATININE 8.46*  CALCIUM 8.4*    Liver Function Tests: Recent Labs  Lab 10/04/22 2202  AST 25  ALT 9  ALKPHOS 56  BILITOT 1.1  PROT 8.0  ALBUMIN 3.4*   No results for input(s): "LIPASE", "AMYLASE" in the last 168 hours. Recent Labs  Lab 10/05/22 0849  AMMONIA 19    CBC: Recent Labs  Lab 10/04/22 2202  WBC 3.7*  NEUTROABS 2.7  HGB 11.9*  HCT 38.7  MCV 113.2*  PLT 74*    Cardiac Enzymes: No results for input(s): "CKTOTAL", "CKMB", "CKMBINDEX", "TROPONINI" in the last 168 hours.  BNP: Invalid input(s): "POCBNP"  CBG: No results for input(s): "GLUCAP" in the last 168 hours.  Microbiology: Results for orders placed or performed during the hospital encounter of 05/28/22  Resp panel by RT-PCR (RSV, Flu A&B, Covid) Anterior Nasal Swab     Status: None   Collection Time: 05/28/22  9:22 AM   Specimen: Anterior Nasal Swab  Result Value Ref Range Status   SARS Coronavirus 2 by RT PCR NEGATIVE NEGATIVE Final    Comment: (NOTE) SARS-CoV-2 target nucleic acids are NOT DETECTED.  The SARS-CoV-2 RNA is generally detectable in upper respiratory specimens during the acute phase of infection. The lowest concentration of SARS-CoV-2 viral copies this assay can detect is 138 copies/mL. A negative result does not preclude SARS-Cov-2 infection and should not be used  as the sole basis for treatment or other patient management decisions. A negative result may occur with  improper specimen collection/handling, submission of specimen other than nasopharyngeal swab, presence of viral mutation(s) within the areas targeted by this assay, and inadequate number of viral copies(<138 copies/mL). A negative result must be combined with clinical observations, patient history, and epidemiological information. The expected result is  Negative.  Fact Sheet for Patients:  BloggerCourse.com  Fact Sheet for Healthcare Providers:  SeriousBroker.it  This test is no t yet approved or cleared by the [place] FDA and  has been authorized for detection and/or diagnosis of SARS-CoV-2 by FDA under an Emergency Use Authorization (EUA). This EUA will remain  in effect (meaning this test can be used) for the duration of the COVID-19 declaration under Section 564(b)(1) of the Act, 21 U.S.C.section 360bbb-3(b)(1), unless the authorization is terminated  or revoked sooner.       Influenza A by PCR NEGATIVE NEGATIVE Final   Influenza B by PCR NEGATIVE NEGATIVE Final    Comment: (NOTE) The Xpert Xpress SARS-CoV-2/FLU/RSV plus assay is intended as an aid in the diagnosis of influenza from Nasopharyngeal swab specimens and should not be used as a sole basis for treatment. Nasal washings and aspirates are unacceptable for Xpert Xpress SARS-CoV-2/FLU/RSV testing.  Fact Sheet for Patients: BloggerCourse.com  Fact Sheet for Healthcare Providers: SeriousBroker.it  This test is not yet approved or cleared by the [place] FDA and has been authorized for detection and/or diagnosis of SARS-CoV-2 by FDA under an Emergency Use Authorization (EUA). This EUA will remain in effect (meaning this test can be used) for the duration of the COVID-19 declaration under Section 564(b)(1) of the Act, 21 U.S.C. section 360bbb-3(b)(1), unless the authorization is terminated or revoked.     Resp Syncytial Virus by PCR NEGATIVE NEGATIVE Final    Comment: (NOTE) Fact Sheet for Patients: BloggerCourse.com  Fact Sheet for Healthcare Providers: SeriousBroker.it  This test is not yet approved or cleared by the [place] FDA and has been authorized for detection and/or diagnosis of  SARS-CoV-2 by FDA under an Emergency Use Authorization (EUA). This EUA will remain in effect (meaning this test can be used) for the duration of the COVID-19 declaration under Section 564(b)(1) of the Act, 21 U.S.C. section 360bbb-3(b)(1), unless the authorization is terminated or revoked.  Performed at [facility], [address] [place], [place] [address]   Gastrointestinal Panel by PCR , Stool     Status: None   Collection Time: 05/28/22 12:10 PM   Specimen: Stool  Result Value Ref Range Status   Campylobacter species NOT DETECTED NOT DETECTED Final   Plesimonas shigelloides NOT DETECTED NOT DETECTED Final   Salmonella species NOT DETECTED NOT DETECTED Final   Yersinia enterocolitica NOT DETECTED NOT DETECTED Final   Vibrio species NOT DETECTED NOT DETECTED Final   Vibrio cholerae NOT DETECTED NOT DETECTED Final   Enteroaggregative E coli (EAEC) NOT DETECTED NOT DETECTED Final   Enteropathogenic E coli (EPEC) NOT DETECTED NOT DETECTED Final   Enterotoxigenic E coli (ETEC) NOT DETECTED NOT DETECTED Final   Shiga like toxin producing E coli (STEC) NOT DETECTED NOT DETECTED Final   Shigella/Enteroinvasive E coli (EIEC) NOT DETECTED NOT DETECTED Final   Cryptosporidium NOT DETECTED NOT DETECTED Final   Cyclospora cayetanensis NOT DETECTED NOT DETECTED Final   Entamoeba histolytica NOT DETECTED NOT DETECTED Final   Giardia lamblia NOT DETECTED NOT DETECTED Final   Adenovirus F40/41 NOT DETECTED NOT DETECTED  Final   Astrovirus NOT DETECTED NOT DETECTED Final   Norovirus GI/GII NOT DETECTED NOT DETECTED Final   Rotavirus A NOT DETECTED NOT DETECTED Final   Sapovirus (I, II, IV, and V) NOT DETECTED NOT DETECTED Final    Comment: Performed at [facility], [address] [place], [place] [address]  C Difficile Quick Screen w PCR reflex     Status: None   Collection Time: 05/28/22 12:10 PM   Specimen: Stool  Result Value Ref Range Status   C Diff antigen  NEGATIVE NEGATIVE Final   C Diff toxin NEGATIVE NEGATIVE Final   C Diff interpretation No C. difficile detected.  Final    Comment: Performed at [facility], [address] [place], [place] [address]  Giardia, [place]; Ova/Parasite     Status: None   Collection Time: 05/28/22 12:10 PM   Specimen: Stool  Result Value Ref Range Status   Ova + Parasite Exam Final report  Final    Comment: (NOTE) These results were obtained using wet preparation(s) and trichrome stained smear. This test does not include testing for Cryptosporidium parvum, Cyclospora, or Microsporidia.    Giardia Ag, Stl Negative Negative Final    Comment: (NOTE) Performed At: [facility] [address] [place], [place] [id number] Rod Mae MD QI:[id number] Performed At: [facility] [address] [place], [place] [id number] Jolene Schimke MD JJ:[id number]   Stool culture     Status: None   Collection Time: 05/28/22 12:10 PM   Specimen: Stool  Result Value Ref Range Status   Salmonella/Shigella Screen Final report  Final   Campylobacter Culture Final report  Final   E coli, Shiga toxin Assay Negative Negative Final    Comment: (NOTE) Performed At: [facility] [address] [place], [place] [id number] Jolene Schimke MD FT:[id number]   STOOL CULTURE REFLEX - RSASHR     Status: None   Collection Time: 05/28/22 12:10 PM  Result Value Ref Range Status   Stool Culture result 1 (RSASHR) Comment  Final    Comment: (NOTE) No Salmonella or Shigella recovered. Performed At: [facility] [address] [place], [place] [id number] Jolene Schimke MD TD:[id number]   STOOL CULTURE Reflex - CMPCXR     Status: None   Collection Time: 05/28/22 12:10 PM  Result Value Ref Range Status   Stool Culture result 1 (CMPCXR) Comment  Final    Comment: (NOTE) No Campylobacter species isolated. Performed At: [facility] [address] [place], [place] [id number] Jolene Schimke MD OE:[id number]     Coagulation Studies: Recent Labs    10/04/22 Nov 19, 2200  LABPROT 18.3*  INR 1.5*    Urinalysis: No results for input(s): "COLORURINE", "LABSPEC", "PHURINE", "GLUCOSEU", "HGBUR", "BILIRUBINUR", "KETONESUR", "PROTEINUR", "UROBILINOGEN", "NITRITE", "LEUKOCYTESUR" in the last 72 hours.  Invalid input(s): "APPERANCEUR"    Imaging: DG Chest Portable 1 View  Result Date: 10/05/2022 CLINICAL DATA:  Dyspnea EXAM: PORTABLE CHEST 1 VIEW COMPARISON:  05/28/2022 FINDINGS: Lung volumes are small. Stable small left pleural effusion. No pneumothorax. No pleural effusion on the right. Cardiac size is stably, mildly enlarged. Right internal jugular hemodialysis catheter tips noted within the right atrium. Pulmonary vascularity is normal. No acute bone abnormality. IMPRESSION: 1. Stable, chronic small left pleural effusion. 2. Stable cardiomegaly. 3. Pulmonary hypoinflation. Electronically Signed   By: Helyn Numbers M.D.   On: 10/05/2022 01:55     Medications:     Chlorhexidine Gluconate Cloth  6 each Topical Q0600   nicotine  21 mg Transdermal Daily   acetaminophen **OR** acetaminophen, albuterol, dextromethorphan-guaiFENesin, diphenhydrAMINE, hydrALAZINE, magnesium hydroxide  Assessment/ Plan:  Ms. Wednesday Ericsson is a 47 y.o.  female with past medical history of hypertension, HIV, HCV, tobacco abuse, pancytopenia, end-stage renal disease on hemodialysis.  Patient presents to the emergency department with leg swelling. She has been admitted under observation for Fluid overload [E87.70]  [facility]/TTS/right chest PermCath  Fluid volume overload with end stage renal disease on hemodialysis. Two treatments missed last week. Chest xray shows small pleural effusion. Will order dialysis today, UF 2.5L as tolerated. Next treatment scheduled for Tuesday, per outpatient schedule.   2. Anemia of chronic kidney disease Lab Results  Component Value Date   HGB 11.9 (L)  10/04/2022    Hgb within desired target. Patient received Mircera outpatient.   3. Secondary Hyperparathyroidism: with outpatient labs: PTH 898, phosphorus 4.3, calcium 9.0 on 09/22/22.   Lab Results  Component Value Date   CALCIUM 8.4 (L) 10/04/2022   PHOS 9.8 (H) 05/28/2022    Patient prescribed calcium acetate with meals outpatient.  4.  Hypertension with chronic kidney disease.  Home regimen includes carvedilol and furosemide.  Both currently held at this time.   LOS: 0 Lynzy Rawles 4/22/202412:06 PM

## 2022-10-05 NOTE — H&P (Signed)
History and Physical    Rebecca Bridges ZOX:[id number] DOB: [birth year] DOA: 10/05/2022  Referring MD/NP/PA:   PCP: [facility]   Patient coming from:  The patient is coming from home.       Chief Complaint: Abdominal distention  HPI: Rebecca Bridges is a 47 y.o. female with medical history significant of ESRD-HD (TTS), sCHF with EF<15%, liver cirrhosis with ascites requiring paracentesis, hypertension, COPD, CAD, anemia, PE on Eliquis, pancytopenia, HIV, HCV (CD4 was 104, viral load <20 on 05/28/2022), left bundle blockade, embolism of lower extremity artery, who presents with abdominal distention.   Pt states that her abdominal distention has been progressively worsening in the past several days.  Patient had paracentesis on 4/15.  She states that she had dialysis on Thursday, but missed dialysis on Saturday.  She has shortness of breath, cough with clear mucus production.  No fever or chills.  Denies nausea, vomiting, diarrhea.  She has some generalized abdominal discomfort.  No symptoms of UTI.  He states that he took last dose Eliquis 2 days ago.  Data reviewed independently and ED Course: pt was found to have potassium 4.9, bicarbonate 25, creatinine 8.46, BUN 44, INR 1.5, PTT 40, ammonia 19, pancytopenia with WBC 3.7, hemoglobin 11.9, platelets 74 (WBC 4.0, hemoglobin 12.1, platelets 71 on 09/24/2012), INR 1.5.  Temperature normal, blood pressure 130/96, heart rate 79, RR 27, 17, oxygen saturation 96% on room air.  Chest x-ray showed small left pleural effusion cardiomegaly.  Patient is placed on telemetry bed for obs. Dr. Wynelle Link of renal was consulted.   EKG: I have personally reviewed.  Sinus rhythm, QTc 584, left bundle blockade, poor R wave progression, anteroseptal infarction pattern, T wave inversion in lead I/aVL  Review of Systems:   General: no fevers, chills, no body weight gain,  has fatigue HEENT: no blurry vision, hearing changes or sore throat Respiratory: has  dyspnea, coughing,  no wheezing CV: no chest pain, no palpitations GI: no nausea, vomiting, has abdominal distention and discomfort, no diarrhea, constipation GU: no dysuria, burning on urination, increased urinary frequency, hematuria  Ext: has trace leg edema Neuro: no unilateral weakness, numbness, or tingling, no vision change or hearing loss Skin: no rash, no skin tear. MSK: No muscle spasm, no deformity, no limitation of range of movement in spin Heme: No easy bruising.  Travel history: No recent long distant travel.   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   a.) occluded anterior tibial, tibioperoneal trunk, and posterior tibial arteries --> thromboembolectomy and PTA of the anterior tibial artery   CAD (coronary artery disease) 02/15/2019   a.) R/LHC 02/15/2019: mild (non-obstructive) diffuse LAD and LCx disease   COPD (chronic obstructive pulmonary disease)    COVID    Dilated cardiomyopathy    a.) TTE 11/27/2013: EF 20%; b.) TTE 03/15/2015: EF 20%; c.) TTE 12/09/2015: EF 45%; d.) TTE 12/22/2016: EF 35%; e.) TTE 08/08/2017: EF 45-50%; f.) TTE 01/27/2019: EF 20-25%; g.) TTE 01/13/2021: EF <15%; h.) TTE 11/21/2021: EF <15%   ESRD (end stage renal disease) on dialysis    a.) T-Th-Sat   GERD (gastroesophageal reflux disease)    Hepatitis C virus    HFrEF (heart failure with reduced ejection fraction)    a.) TTE 11/27/2013: EF 20%; b.) TTE 03/15/2015: EF 20%; c.) TTE 12/09/2015: EF 45%; d.)  TTE 12/22/2016: EF 35%, G1DD; e.) TTE 08/08/2017: EF 45-50%, G1DD; f.) TTE 01/27/2019: EF 20-25%, G1DD; g.) R/LHC 02/15/2019: mPA 25, mPCWP 13, CO 4.84, CI 3.15; h.) TTE 01/13/2021: EF <15%, sev glob HK, sep/apical AK, mod asym LVH, sev LAE, mild RAE, G2DD; I.) TTE 11/21/2021: EF <15%, G2DD   History of noncompliance with medical  treatment    HIV (human immunodeficiency virus infection) 2015   a.) on bictegravir-emtricitabine-tenofovir (Biktarvy)   Hypertension    Insomnia    a.) on Trazodone PRN   LBBB (left bundle branch block)    Liver disease, chronic, with cirrhosis    Long term current use of anticoagulant    a.) apixaban   Pancytopenia    Prolonged Q-T interval on ECG    Splenomegaly    Tobacco abuse     Past Surgical History:  Procedure Laterality Date   A/V FISTULAGRAM Left 03/31/2022   Procedure: A/V Fistulagram;  Surgeon: Renford Dills, MD;  Location: [facility];  Service: Cardiovascular;  Laterality: Left;   CHOLECYSTECTOMY     LOWER EXTREMITY ANGIOGRAPHY Left 01/25/2019   Procedure: Lower Extremity Angiography;  Surgeon: Renford Dills, MD;  Location: [facility];  Service: Cardiovascular;  Laterality: Left;   RIGHT/LEFT HEART CATH AND CORONARY ANGIOGRAPHY Bilateral 02/15/2019   Procedure: RIGHT/LEFT HEART CATH AND CORONARY ANGIOGRAPHY;  Surgeon: Antonieta Iba, MD;  Location: [facility];  Service: Cardiovascular;  Laterality: Bilateral;    Social History:  reports that she has been smoking cigarettes. She has been smoking an average of .25 packs per day. She has never used smokeless tobacco. She reports that she does not drink alcohol and does not use drugs.  Family History:  Family History  Problem Relation Age of Onset   Hypertension Mother    Cerebral aneurysm Mother      Prior to Admission medications   Medication Sig Start Date End Date Taking? Authorizing Provider  apixaban (ELIQUIS) 5 MG TABS tablet Take 5 mg by mouth 2 (two) times daily. 08/04/22  Yes [provider]  B Complex-C-Folic Acid (RENA-VITE RX) 1 MG TABS Take 1 tablet by mouth daily. 07/17/21  Yes [provider]  bictegravir-emtricitabine-tenofovir AF (BIKTARVY) 50-200-25 MG TABS tablet Take 1 tablet by mouth at bedtime.   Yes [provider]  calcium  acetate (PHOSLO) 667 MG capsule Take 2,001 mg by mouth 3 (three) times daily. 04/22/21  Yes [provider]  carvedilol (COREG) 12.5 MG tablet Take 12.5 mg by mouth 2 (two) times daily with a meal.   Yes [provider]  furosemide (LASIX) 80 MG tablet Take 80 mg by mouth 2 (two) times daily. 03/17/22  Yes [provider]  gabapentin (NEURONTIN) 300 MG capsule Take 300 mg by mouth at bedtime. 03/12/22  Yes [provider]  pantoprazole (PROTONIX) 40 MG tablet Take 40 mg by mouth daily. 04/25/22  Yes [provider]  prednisoLONE acetate (PRED FORTE) 1 % ophthalmic suspension Place 1 drop into the right eye at bedtime. 07/16/21  Yes [provider]  Sofosbuvir-Velpatasvir 400-100 MG TABS Take 1 tablet by mouth daily. 09/09/22 03/08/23 Yes [provider]  traZODone (DESYREL) 50 MG tablet SMARTSIG:1 Tablet(s) By Mouth Every Evening 03/24/22  Yes [provider]  Multiple Vitamin (MULTIVITAMIN WITH MINERALS) TABS tablet Take 1 tablet by mouth daily. Patient not taking: Reported on 05/28/2022    [provider]    Physical Exam: Vitals:   10/05/22  1030 10/05/22 1145 10/05/22 1330 10/05/22 1345  BP: (!) 137/106   (!) 143/97  Pulse: 85 69  96  Resp:  14 (!) 23   Temp:      TempSrc:      SpO2: 99% 99%  98%   General: Not in acute distress HEENT:       Eyes: PERRL, EOMI, no scleral icterus.       ENT: No discharge from the ears and nose, no pharynx injection, no tonsillar enlargement.        Neck: positive JVD, no bruit, no mass felt. Heme: No neck lymph node enlargement. Cardiac: S1/S2, RRR, No murmurs, No gallops or rubs. Respiratory: No rales, wheezing, rhonchi or rubs. GI: has abdominal distention, no tenderness, no rebound pain, no organomegaly, BS present. GU: No hematuria Ext: has trace leg edema bilaterally. 1+DP/PT pulse bilaterally. Musculoskeletal: No joint deformities, No joint redness or warmth, no  limitation of ROM in spin. Skin: No rashes.  Neuro: Alert, oriented X3, cranial nerves II-XII grossly intact, moves all extremities normally.  Psych: Patient is not psychotic, no suicidal or hemocidal ideation.  Labs on Admission: I have personally reviewed following labs and imaging studies  CBC: Recent Labs  Lab 10/04/22 2202  WBC 3.7*  NEUTROABS 2.7  HGB 11.9*  HCT 38.7  MCV 113.2*  PLT 74*   Basic Metabolic Panel: Recent Labs  Lab 10/04/22 2202  NA 138  K 4.9  CL 104  CO2 25  GLUCOSE 100*  BUN 44*  CREATININE 8.46*  CALCIUM 8.4*   GFR: CrCl cannot be calculated (Unknown ideal weight.). Liver Function Tests: Recent Labs  Lab 10/04/22 2202  AST 25  ALT 9  ALKPHOS 56  BILITOT 1.1  PROT 8.0  ALBUMIN 3.4*   No results for input(s): "LIPASE", "AMYLASE" in the last 168 hours. Recent Labs  Lab 10/05/22 0849  AMMONIA 19   Coagulation Profile: Recent Labs  Lab 10/04/22 2202  INR 1.5*   Cardiac Enzymes: No results for input(s): "CKTOTAL", "CKMB", "CKMBINDEX", "TROPONINI" in the last 168 hours. BNP (last 3 results) No results for input(s): "PROBNP" in the last 8760 hours. HbA1C: No results for input(s): "HGBA1C" in the last 72 hours. CBG: No results for input(s): "GLUCAP" in the last 168 hours. Lipid Profile: No results for input(s): "CHOL", "HDL", "LDLCALC", "TRIG", "CHOLHDL", "LDLDIRECT" in the last 72 hours. Thyroid Function Tests: No results for input(s): "TSH", "T4TOTAL", "FREET4", "T3FREE", "THYROIDAB" in the last 72 hours. Anemia Panel: No results for input(s): "VITAMINB12", "FOLATE", "FERRITIN", "TIBC", "IRON", "RETICCTPCT" in the last 72 hours. Urine analysis:    Component Value Date/Time   COLORURINE YELLOW (A) 06/13/2019 1150   APPEARANCEUR HAZY (A) 06/13/2019 1150   LABSPEC 1.010 06/13/2019 1150   PHURINE 6.0 06/13/2019 1150   GLUCOSEU NEGATIVE 06/13/2019 1150   HGBUR NEGATIVE 06/13/2019 1150   BILIRUBINUR NEGATIVE 06/13/2019 1150    KETONESUR NEGATIVE 06/13/2019 1150   PROTEINUR >=300 (A) 06/13/2019 1150   NITRITE NEGATIVE 06/13/2019 1150   LEUKOCYTESUR SMALL (A) 06/13/2019 1150   Sepsis Labs: (procalcitonin:4,lacticidven:4) )No results found for this or any previous visit (from the past 240 hour(s)).   Radiological Exams on Admission: DG Chest Portable 1 View  Result Date: 10/05/2022 CLINICAL DATA:  Dyspnea EXAM: PORTABLE CHEST 1 VIEW COMPARISON:  05/28/2022 FINDINGS: Lung volumes are small. Stable small left pleural effusion. No pneumothorax. No pleural effusion on the right. Cardiac size is stably, mildly enlarged. Right internal jugular hemodialysis catheter tips noted within the  right atrium. Pulmonary vascularity is normal. No acute bone abnormality. IMPRESSION: 1. Stable, chronic small left pleural effusion. 2. Stable cardiomegaly. 3. Pulmonary hypoinflation. Electronically Signed   By: Helyn Numbers M.D.   On: 10/05/2022 01:55      Assessment/Plan Principal Problem:   Fluid overload Active Problems:   ESRD on dialysis   Chronic systolic CHF (congestive heart failure)   Liver cirrhosis   COPD (chronic obstructive pulmonary disease)   Human immunodeficiency virus (HIV) disease   HTN (hypertension), malignant   Tobacco abuse   Embolism of artery of lower extremity   Pulmonary embolism   Pancytopenia   HCV (hepatitis C virus)   Prolonged Q-T interval on ECG   Assessment and Plan:  Fluid overload: Patient has worsening ascites, shortness of breath, leg edema, positive JVD, clinically consistent with fluid overload. -Placed on telemetry bed for position -Dr. Wynelle Link is consulted for dialysis  ESRD on dialysis -Dr. Wynelle Link is consulted for dialysis  Chronic systolic CHF (congestive heart failure): 2D echo on 11/21/2021 showed EF<15%.  No history of fluid overload.  BNP >4500 -Volume management per renal by dialysis  Liver cirrhosis and ascites: Mental status normal -Ordered  paracentesis -Will 12.5 g of albumin -Check ammonia level --> 19.  COPD (chronic obstructive pulmonary disease) -As needed bronchodilators  Human immunodeficiency virus (HIV) disease: CD4 = 104 and VL < 20 on 05/28/22 -Continue home medications  HTN (hypertension), malignant -IV hydralazine as needed -Coreg, Lasix  Tobacco abuse -Nicotine patch  Embolism of artery of lower extremity and Pulmonary embolism -Eliquis  Pancytopenia: Hemoglobin stable 11.9 -Follow-up with CBC  HCV (hepatitis C virus) -Continue home Sofosbuvir-Velpatasvir  Prolonged Q-T interval on ECG: QTc 584. -Avoid using QT prolonging medications, such as Zofran        DVT ppx: on Eliquis  Code Status: Full code  Family Communication: not done, no family member is at bed side.    Disposition Plan:  Anticipate discharge back to previous environment  Consults called:  Dr. Wynelle Link of renal was consulted  Admission status and Level of care: Telemetry Cardiac:    as inpt      Dispo: The patient is from: Home              Anticipated d/c is to: Home              Anticipated d/c date is: 1 day              Patient currently is not medically stable to d/c.    Severity of Illness:  The appropriate patient status for this patient is OBSERVATION. Observation status is judged to be reasonable and necessary in order to provide the required intensity of service to ensure the patient's safety. The patient's presenting symptoms, physical exam findings, and initial radiographic and laboratory data in the context of their medical condition is felt to place them at decreased risk for further clinical deterioration. Furthermore, it is anticipated that the patient will be medically stable for discharge from the hospital within 2 midnights of admission.        Date of Service 10/05/2022    Lorretta Harp [facility]   If 7PM-7AM, please contact night-coverage [email or web address] 10/05/2022, 2:20 PM

## 2022-10-05 NOTE — Procedures (Signed)
Ultrasound-guided diagnostic and therapeutic left sided paracentesis performed yielding 600 ml liters of amber colored fluid.  Fluid was sent to lab for analysis. No immediate complications. EBL is < 2 ml.

## 2022-10-05 NOTE — ED Provider Notes (Signed)
[facility] Provider Note    Event Date/Time   First MD Initiated Contact with Patient 10/05/22 0126     (approximate)   History   Leg Swelling   HPI  Rebecca Bridges is a 47 y.o. 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  Has had multiple previous paracenteses but does not have another one scheduled.  No fevers or abdominal pain.  Last dialysis was on Thursday.  Missed Saturday's dialysis due to being out of town.  States she does not go back to dialysis until Tuesday.  Denies chest pain, fever, vomiting, diarrhea.  History provided by patient.    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   a.) occluded anterior tibial, tibioperoneal trunk, and posterior tibial arteries --> thromboembolectomy and PTA of the anterior tibial artery   CAD (coronary artery disease) 02/15/2019   a.) R/LHC 02/15/2019: mild (non-obstructive) diffuse LAD and LCx disease   COPD (chronic obstructive pulmonary disease) (HCC)    COVID    Dilated cardiomyopathy (HCC)    a.) TTE 11/27/2013: EF 20%; b.) TTE 03/15/2015: EF 20%; c.) TTE 12/09/2015: EF 45%; d.) TTE 12/22/2016: EF 35%; e.) TTE 08/08/2017: EF 45-50%; f.) TTE 01/27/2019: EF 20-25%; g.) TTE 01/13/2021: EF <15%; h.) TTE 11/21/2021: EF <15%   ESRD (end stage renal disease) on dialysis (HCC)    a.) T-Th-Sat   GERD (gastroesophageal reflux disease)    Hepatitis C virus    HFrEF (heart failure with reduced ejection fraction) (HCC)    a.) TTE 11/27/2013: EF 20%; b.) TTE 03/15/2015: EF 20%; c.) TTE 12/09/2015: EF 45%; d.) TTE 12/22/2016: EF 35%, G1DD; e.) TTE 08/08/2017: EF 45-50%, G1DD; f.) TTE 01/27/2019: EF  20-25%, G1DD; g.) R/LHC 02/15/2019: mPA 25, mPCWP 13, CO 4.84, CI 3.15; h.) TTE 01/13/2021: EF <15%, sev glob HK, sep/apical AK, mod asym LVH, sev LAE, mild RAE, G2DD; I.) TTE 11/21/2021: EF <15%, G2DD   History of noncompliance with medical treatment    HIV (human immunodeficiency virus infection) (HCC) 2015   a.) on bictegravir-emtricitabine-tenofovir (Biktarvy)   Hypertension    Insomnia    a.) on Trazodone PRN   LBBB (left bundle branch block)    Liver disease, chronic, with cirrhosis (HCC)    Long term current use of anticoagulant    a.) apixaban   Pancytopenia (HCC)    Prolonged Q-T interval on ECG    Splenomegaly    Tobacco abuse     Past Surgical History:  Procedure Laterality Date   A/V FISTULAGRAM Left 03/31/2022   Procedure: A/V Fistulagram;  Surgeon: Renford Dills, MD;  Location: [facility] INVASIVE CV LAB;  Service: Cardiovascular;  Laterality: Left;   CHOLECYSTECTOMY     LOWER EXTREMITY ANGIOGRAPHY Left 01/25/2019   Procedure: Lower Extremity Angiography;  Surgeon: Renford Dills, MD;  Location: [facility] INVASIVE CV LAB;  Service: Cardiovascular;  Laterality: Left;   RIGHT/LEFT HEART CATH AND CORONARY ANGIOGRAPHY Bilateral 02/15/2019   Procedure: RIGHT/LEFT HEART CATH AND CORONARY ANGIOGRAPHY;  Surgeon: Antonieta Iba, MD;  Location: [facility] INVASIVE CV LAB;  Service: Cardiovascular;  Laterality: Bilateral;    MEDICATIONS:  Prior to Admission medications   Medication Sig Start Date End Date Taking? Authorizing Provider  B Complex-C-Folic  Acid (RENA-VITE RX) 1 MG TABS Take 1 tablet by mouth daily. 07/17/21   [provider]  bictegravir-emtricitabine-tenofovir AF (BIKTARVY) 50-200-25 MG TABS tablet Take 1 tablet by mouth at bedtime.    [provider]  calcium acetate (PHOSLO) 667 MG capsule Take 2,001 mg by mouth 3 (three) times daily. 04/22/21   [provider]  carvedilol (COREG) 12.5 MG tablet Take 12.5 mg by mouth 2 (two) times daily with a  meal.    [provider]  dapsone 100 MG tablet Take 100 mg by mouth daily. 09/15/21   [provider]  ELIQUIS 2.5 MG TABS tablet Take 2.5 mg by mouth 2 (two) times daily. 04/17/22   [provider]  furosemide (LASIX) 80 MG tablet Take 80 mg by mouth 2 (two) times daily. 03/17/22   [provider]  gabapentin (NEURONTIN) 300 MG capsule Take 300 mg by mouth at bedtime. 03/12/22   [provider]  Multiple Vitamin (MULTIVITAMIN WITH MINERALS) TABS tablet Take 1 tablet by mouth daily. Patient not taking: Reported on 05/28/2022    [provider]  pantoprazole (PROTONIX) 40 MG tablet Take 40 mg by mouth 2 (two) times daily. 04/25/22   [provider]  prednisoLONE acetate (PRED FORTE) 1 % ophthalmic suspension Place 1 drop into the right eye at bedtime. 07/16/21   [provider]  traZODone (DESYREL) 50 MG tablet SMARTSIG:1 Tablet(s) By Mouth Every Evening 03/24/22   [provider]    Physical Exam   Triage Vital Signs: ED Triage Vitals  Enc Vitals Group     BP 10/04/22 2157 (!) 136/106     Pulse Rate 10/04/22 2157 79     Resp 10/04/22 2157 (!) 26     Temp 10/04/22 2157 97.9 F (36.6 C)     Temp Source 10/05/22 0057 Oral     SpO2 10/04/22 2157 100 %     Weight --      Height --      Head Circumference --      Peak Flow --      Pain Score 10/04/22 2157 0     Pain Loc --      Pain Edu? --      Excl. in GC? --     Most recent vital signs: Vitals:   10/04/22 2157 10/05/22 0057  BP: (!) 136/106 (!) 128/96  Pulse: 79 77  Resp: (!) 26 17  Temp: 97.9 F (36.6 C) 97.8 F (36.6 C)  SpO2: 100% 100%    CONSTITUTIONAL: Alert, responds appropriately to questions.  Chronically ill-appearing HEAD: Normocephalic, atraumatic EYES: Conjunctivae clear, pupils appear equal, sclera nonicteric ENT: normal nose; moist mucous membranes NECK: Supple, normal ROM CARD: RRR; S1 and S2 appreciated RESP: Normal chest  excursion without splinting or tachypnea; breath sounds clear and equal bilaterally; no wheezes, no rhonchi, no rales, no hypoxia or respiratory distress, speaking full sentences ABD/GI: Distended abdomen with fluid wave, nontender, no tympany, no peritoneal signs BACK: The back appears normal EXT: Normal ROM in all joints; no deformity noted, mild pitting edema in the ankles and feet bilaterally, no calf tenderness or calf swelling SKIN: Normal color for age and race; warm; no rash on exposed skin NEURO: Moves all extremities equally, normal speech PSYCH: The patient's mood and manner are appropriate.   ED Results / Procedures / Treatments   LABS: (all labs ordered are listed, but only abnormal results are displayed) Labs Reviewed  CBC WITH DIFFERENTIAL/PLATELET - Abnormal;  Notable for the following components:      Result Value   WBC 3.7 (*)    RBC 3.42 (*)    Hemoglobin 11.9 (*)    MCV 113.2 (*)    MCH 34.8 (*)    RDW 19.9 (*)    Platelets 74 (*)    Lymphs Abs 0.6 (*)    All other components within normal limits  BASIC METABOLIC PANEL - Abnormal; Notable for the following components:   Glucose, Bld 100 (*)    BUN 44 (*)    Creatinine, Ser 8.46 (*)    Calcium 8.4 (*)    GFR, Estimated 5 (*)    All other components within normal limits  HEPATIC FUNCTION PANEL - Abnormal; Notable for the following components:   Albumin 3.4 (*)    Bilirubin, Direct 0.3 (*)    All other components within normal limits  BRAIN NATRIURETIC PEPTIDE - Abnormal; Notable for the following components:   B Natriuretic Peptide >4,500.0 (*)    All other components within normal limits  TROPONIN I (HIGH SENSITIVITY) - Abnormal; Notable for the following components:   Troponin I (High Sensitivity) 53 (*)    All other components within normal limits  PROTIME-INR     EKG:  EKG Interpretation  Date/Time:  Monday October 05 2022 02:05:27 EDT Ventricular Rate:  71 PR Interval:  203 QRS Duration: 164 QT  Interval:  500 QTC Calculation: 544 R Axis:   -54 Text Interpretation: Sinus rhythm Borderline prolonged PR interval Left bundle branch block No significant change since last tracing Confirmed by Rochele Raring [phone]) on 10/05/2022 2:36:39 AM         RADIOLOGY: My personal review and interpretation of imaging: Patient has stable cardiomegaly and a small left pleural effusion.  No overt edema noted.  I have personally reviewed all radiology reports.   DG Chest Portable 1 View  Result Date: 10/05/2022 CLINICAL DATA:  Dyspnea EXAM: PORTABLE CHEST 1 VIEW COMPARISON:  05/28/2022 FINDINGS: Lung volumes are small. Stable small left pleural effusion. No pneumothorax. No pleural effusion on the right. Cardiac size is stably, mildly enlarged. Right internal jugular hemodialysis catheter tips noted within the right atrium. Pulmonary vascularity is normal. No acute bone abnormality. IMPRESSION: 1. Stable, chronic small left pleural effusion. 2. Stable cardiomegaly. 3. Pulmonary hypoinflation. Electronically Signed   By: Helyn Numbers M.D.   On: 10/05/2022 01:55     PROCEDURES:  Critical Care performed: No     .1-3 Lead EKG Interpretation  Performed by: Maleia Weems, Layla Maw, DO Authorized by: Hanaa Payes, Layla Maw, DO     Interpretation: normal     ECG rate:  77   ECG rate assessment: normal     Rhythm: sinus rhythm     Ectopy: none     Conduction: normal       IMPRESSION / MDM / ASSESSMENT AND PLAN / ED COURSE  I reviewed the triage vital signs and the nursing notes.    Patient here with shortness of breath, leg swelling, abdominal swelling.  The patient is on the cardiac monitor to evaluate for evidence of arrhythmia and/or significant heart rate changes.   DIFFERENTIAL DIAGNOSIS (includes but not limited to):   CHF exacerbation, volume overload from missed dialysis, ascites, doubt SBP, pneumonia, ACS   Patient's presentation is most consistent with acute presentation with potential  threat to life or bodily function.   PLAN: Will obtain labs, chest x-ray, EKG.  Patient is not in distress at this  time but is symptomatic.  Will likely need admission for dialysis given she does not have dialysis again scheduled until Tuesday and she will likely need a paracentesis but does not need this emergently.   MEDICATIONS GIVEN IN ED: Medications - No data to display   ED COURSE: BNP greater than 4500.  Hemoglobin stable.  Chronic thrombocytopenia.  Normal electrolytes, including potassium.  LFTs show no significant abnormality.  Troponin is elevated at 53 which is her baseline and likely due to end-stage renal disease.  Chest x-ray reviewed and interpreted by myself and the radiologist and shows a small left pleural effusion but no overt edema.  She has stable cardiomegaly.  Will discuss with hospitalist for admission for nonemergent dialysis and paracentesis for symptomatic relief.   CONSULTS:  Consulted and discussed patient's case with hospitalist, Dr. Arville Care.  I have recommended admission and consulting physician agrees and will place admission orders.  Patient (and family if present) agree with this plan.   I reviewed all nursing notes, vitals, pertinent previous records.  All labs, EKGs, imaging ordered have been independently reviewed and interpreted by myself.    OUTSIDE RECORDS REVIEWED: Reviewed last admission in December 2023.  Reviewed last paracentesis by IR on 09/28/2022         FINAL CLINICAL IMPRESSION(S) / ED DIAGNOSES   Final diagnoses:  Peripheral edema  Other ascites     Rx / DC Orders   ED Discharge Orders     None        Note:  This document was prepared using Dragon voice recognition software and may include unintentional dictation errors.   Keitra Carusone, Layla Maw, DO 10/05/22 [phone]

## 2022-10-05 NOTE — ED Notes (Signed)
Pt ambulatory to bathroom independently with steady gait.

## 2022-10-05 NOTE — ED Notes (Signed)
Dr Niu at bedside 

## 2022-10-05 NOTE — ED Notes (Signed)
Pt to dialysis.

## 2022-10-06 ENCOUNTER — Observation Stay
Admission: RE | Admit: 2022-10-06 | Discharge: 2022-10-06 | Disposition: A | Discharge status: Home / Self Care | Payer: 59 | Source: Ambulatory Visit | Attending: Vascular Surgery | Admitting: Vascular Surgery

## 2022-10-06 ENCOUNTER — Other Ambulatory Visit: Payer: Self-pay

## 2022-10-06 ENCOUNTER — Other Ambulatory Visit (INDEPENDENT_AMBULATORY_CARE_PROVIDER_SITE_OTHER): Payer: Self-pay | Admitting: Nurse Practitioner

## 2022-10-06 ENCOUNTER — Encounter: Payer: Self-pay | Admitting: Urgent Care

## 2022-10-06 VITALS — Ht 63.0 in | Wt 119.0 lb

## 2022-10-06 DIAGNOSIS — K7460 Unspecified cirrhosis of liver: Secondary | ICD-10-CM

## 2022-10-06 DIAGNOSIS — R188 Other ascites: Secondary | ICD-10-CM

## 2022-10-06 DIAGNOSIS — B182 Chronic viral hepatitis C: Secondary | ICD-10-CM

## 2022-10-06 DIAGNOSIS — I10 Essential (primary) hypertension: Secondary | ICD-10-CM

## 2022-10-06 DIAGNOSIS — Z720 Tobacco use: Secondary | ICD-10-CM

## 2022-10-06 DIAGNOSIS — N186 End stage renal disease: Secondary | ICD-10-CM

## 2022-10-06 DIAGNOSIS — I2699 Other pulmonary embolism without acute cor pulmonale: Secondary | ICD-10-CM

## 2022-10-06 DIAGNOSIS — J449 Chronic obstructive pulmonary disease, unspecified: Secondary | ICD-10-CM | POA: Diagnosis not present

## 2022-10-06 DIAGNOSIS — R140 Abdominal distension (gaseous): Secondary | ICD-10-CM | POA: Diagnosis not present

## 2022-10-06 DIAGNOSIS — E875 Hyperkalemia: Secondary | ICD-10-CM

## 2022-10-06 LAB — CBC
HCT: 34.3 % — ABNORMAL LOW (ref 36.0–46.0)
Hemoglobin: 11.1 g/dL — ABNORMAL LOW (ref 12.0–15.0)
MCH: 35.5 pg — ABNORMAL HIGH (ref 26.0–34.0)
MCHC: 32.4 g/dL (ref 30.0–36.0)
MCV: 109.6 fL — ABNORMAL HIGH (ref 80.0–100.0)
Platelets: 58 10*3/uL — ABNORMAL LOW (ref 150–400)
RBC: 3.13 MIL/uL — ABNORMAL LOW (ref 3.87–5.11)
RDW: 19.4 % — ABNORMAL HIGH (ref 11.5–15.5)
WBC: 2.7 10*3/uL — ABNORMAL LOW (ref 4.0–10.5)
nRBC: 0 % (ref 0.0–0.2)

## 2022-10-06 LAB — RENAL FUNCTION PANEL
Albumin: 3.3 g/dL — ABNORMAL LOW (ref 3.5–5.0)
Anion gap: 11 (ref 5–15)
BUN: 53 mg/dL — ABNORMAL HIGH (ref 6–20)
CO2: 22 mmol/L (ref 22–32)
Calcium: 8.2 mg/dL — ABNORMAL LOW (ref 8.9–10.3)
Chloride: 103 mmol/L (ref 98–111)
Creatinine, Ser: 9.47 mg/dL — ABNORMAL HIGH (ref 0.44–1.00)
GFR, Estimated: 5 mL/min — ABNORMAL LOW (ref >60)
Glucose, Bld: 101 mg/dL — ABNORMAL HIGH (ref 70–99)
Phosphorus: 6.7 mg/dL — ABNORMAL HIGH (ref 2.5–4.6)
Potassium: 5.3 mmol/L — ABNORMAL HIGH (ref 3.5–5.1)
Sodium: 136 mmol/L (ref 135–145)

## 2022-10-06 LAB — BODY FLUID CULTURE W GRAM STAIN: Gram Stain: NONE SEEN

## 2022-10-06 LAB — HEPATITIS B SURFACE ANTIBODY, QUANTITATIVE: Hep B S AB Quant (Post): <3.5 m[IU]/mL — ABNORMAL LOW (ref >9.9)

## 2022-10-06 MED ORDER — HEPARIN SODIUM (PORCINE) 1000 UNIT/ML IJ SOLN
INTRAMUSCULAR | Status: AC
  Filled 2022-10-06: qty 10

## 2022-10-06 NOTE — Progress Notes (Signed)
  Transition of Care (TOC) Screening Note   Patient Details  Name: Rebecca Bridges Date of Birth: 01-22-1976   Transition of Care [facility]) CM/SW Contact:    Truddie Hidden, RN Phone Number: 10/06/2022, 1:28 PM    Transition of Care Department [facility]) has reviewed patient and no TOC needs have been identified at this time. We will continue to monitor patient advancement through interdisciplinary progression rounds. If new patient transition needs arise, please place a TOC consult.

## 2022-10-06 NOTE — Plan of Care (Signed)

## 2022-10-06 NOTE — Progress Notes (Signed)
Hemodialysis note  Received patient in bed to unit. Alert and oriented.  Informed consent signed and in chart.  Treatment initiated: 0812 Treatment completed: 1117  Patient tolerated well. Transported back to room, alert without acute distress.  Report given to patient's RN.   Access used: Right Chest PermCath Access issues: none  Total UF removed: 2L Medication(s) given:  none Post HD weight: 49.2 kg   Wolfgang Phoenix [facility]

## 2022-10-06 NOTE — Progress Notes (Signed)
[facility]  ROUNDING NOTE   Subjective:   Rebecca Bridges is a 47 y.o. female with past medical history of hypertension, HIV, HCV, tobacco abuse, pancytopenia, end-stage renal disease on hemodialysis.  Patient presents to the emergency department with leg swelling. She has been admitted under observation for Shortness of breath [R06.02] Peripheral edema [R60.0] Pleural effusion [J90] Other ascites [R18.8] Fluid overload [E87.70]  Patient is known to our practice and receives outpatient dialysis treatments at [facility] on a TTS schedule, supervised by Dr. Thedore Mins.    Patient seen and evaluated during dialysis   HEMODIALYSIS FLOWSHEET:  Blood Flow Rate (mL/min): 350 mL/min Arterial Pressure (mmHg): -170 mmHg Venous Pressure (mmHg): 160 mmHg TMP (mmHg): 18 mmHg Ultrafiltration Rate (mL/min): 901 mL/min Dialysate Flow Rate (mL/min): 300 ml/min  Resting comfortably  Denies shortness of breath  Objective:  Vital signs in last 24 hours:  Temp:  [97.6 F (36.4 C)-98 F (36.7 C)] 97.8 F (36.6 C) (04/23 1152) Pulse Rate:  [58-96] 68 (04/23 1152) Resp:  [14-27] 16 (04/23 1152) BP: (101-143)/(61-105) 136/84 (04/23 1152) SpO2:  [97 %-100 %] 100 % (04/23 1152) Weight:  [49.2 kg-54 kg] 49.2 kg (04/23 1117)  Weight change:  Filed Weights   10/05/22 1500 10/06/22 0750 10/06/22 1117  Weight: 54 kg 51.5 kg 49.2 kg    Intake/Output: I/O last 3 completed shifts: In: 170 [P.O.:120; IV Piggyback:50] Out: 1 [Stool:1]   Intake/Output this shift:  Total I/O In: -  Out: 2000 [Other:2000]  Physical Exam: General: NAD  Head: Normocephalic, atraumatic. Moist oral mucosal membranes  Eyes: Anicteric  Lungs:  Rhonchi, room air  Heart: Regular rate and rhythm  Abdomen:  Soft, nontender mild distention  Extremities:  No peripheral edema.  Neurologic: Nonfocal, moving all four extremities  Skin: No lesions  Access: Rt permcath    Basic Metabolic Panel: Recent Labs   Lab 10/04/22 2202 10/06/22 0810  NA 138 136  K 4.9 5.3*  CL 104 103  CO2 25 22  GLUCOSE 100* 101*  BUN 44* 53*  CREATININE 8.46* 9.47*  CALCIUM 8.4* 8.2*  PHOS  --  6.7*     Liver Function Tests: Recent Labs  Lab 10/04/22 2202 10/06/22 0810  AST 25  --   ALT 9  --   ALKPHOS 56  --   BILITOT 1.1  --   PROT 8.0  --   ALBUMIN 3.4* 3.3*    No results for input(s): "LIPASE", "AMYLASE" in the last 168 hours. Recent Labs  Lab 10/05/22 0849  AMMONIA 19     CBC: Recent Labs  Lab 10/04/22 2202 10/05/22 1515 10/06/22 0459  WBC 3.7* 3.8* 2.7*  NEUTROABS 2.7  --   --   HGB 11.9* 11.8* 11.1*  HCT 38.7 37.4 34.3*  MCV 113.2* 110.7* 109.6*  PLT 74* 77* 58*     Cardiac Enzymes: No results for input(s): "CKTOTAL", "CKMB", "CKMBINDEX", "TROPONINI" in the last 168 hours.  BNP: Invalid input(s): "POCBNP"  CBG: No results for input(s): "GLUCAP" in the last 168 hours.  Microbiology: Results for orders placed or performed during the hospital encounter of 10/05/22  Body fluid culture w Gram Stain     Status: None (Preliminary result)   Collection Time: 10/05/22  3:02 PM   Specimen: PATH Cytology Peritoneal fluid  Result Value Ref Range Status   Specimen Description   Final    PERITONEAL Performed at [facility], [address], [place], [place] [address]    Special  Requests   Final    NONE Performed at [facility], [address] [place], [place] [address]    Gram Stain   Final    NO WBC SEEN NO ORGANISMS SEEN Performed at [facility], [address] [place], [place] [address]    Culture PENDING  Incomplete   Report Status PENDING  Incomplete    Coagulation Studies: Recent Labs    10/04/22 2200/11/10  LABPROT 18.3*  INR 1.5*     Urinalysis: No results for input(s): "COLORURINE", "LABSPEC", "PHURINE", "GLUCOSEU", "HGBUR", "BILIRUBINUR", "KETONESUR", "PROTEINUR", "UROBILINOGEN", "NITRITE", "LEUKOCYTESUR" in the last 72  hours.  Invalid input(s): "APPERANCEUR"    Imaging: US Paracentesis  Result Date: 10/05/2022 INDICATION: Patient with history end-stage renal disease on hemodialysis found to be in fluid overload with ascites. Request is for therapeutic and diagnostic paracentesis EXAM: ULTRASOUND GUIDED THERAPEUTIC AND DIAGNOSTIC PARACENTESIS MEDICATIONS: Lidocaine 1% 10 mL COMPLICATIONS: None immediate. PROCEDURE: Informed written consent was obtained from the patient after a discussion of the risks, benefits and alternatives to treatment. A timeout was performed prior to the initiation of the procedure. Initial ultrasound scanning demonstrates a small amount of ascites within the left lower abdominal quadrant. The left lower abdomen was prepped and draped in the usual sterile fashion. 1% lidocaine was used for local anesthesia. Following this, a 19 gauge, 7-cm, Yueh catheter was introduced. An ultrasound image was saved for documentation purposes. The paracentesis was performed. The catheter was removed and a dressing was applied. The patient tolerated the procedure well without immediate post procedural complication. FINDINGS: A total of approximately 600 ml of amber colored fluid was removed. Samples were sent to the laboratory as requested by the clinical team. IMPRESSION: Successful ultrasound-guided paracentesis yielding 600 milliliters of peritoneal fluid. Read by: Anders Grant, NP Electronically Signed   By: Malachy Moan M.D.   On: 10/05/2022 16:11   DG Chest Portable 1 View  Result Date: 10/05/2022 CLINICAL DATA:  Dyspnea EXAM: PORTABLE CHEST 1 VIEW COMPARISON:  05/28/2022 FINDINGS: Lung volumes are small. Stable small left pleural effusion. No pneumothorax. No pleural effusion on the right. Cardiac size is stably, mildly enlarged. Right internal jugular hemodialysis catheter tips noted within the right atrium. Pulmonary vascularity is normal. No acute bone abnormality. IMPRESSION: 1. Stable,  chronic small left pleural effusion. 2. Stable cardiomegaly. 3. Pulmonary hypoinflation. Electronically Signed   By: Helyn Numbers M.D.   On: 10/05/2022 01:55     Medications:     apixaban  5 mg Oral BID   bictegravir-emtricitabine-tenofovir AF  1 tablet Oral Daily   calcium acetate  2,001 mg Oral TID AC   carvedilol  12.5 mg Oral BID WC   Chlorhexidine Gluconate Cloth  6 each Topical Q0600   furosemide  80 mg Oral BID   gabapentin  300 mg Oral QHS   multivitamin  1 tablet Oral Daily   nicotine  21 mg Transdermal Daily   pantoprazole  40 mg Oral Daily   Sofosbuvir-Velpatasvir  1 tablet Oral Daily   acetaminophen **OR** acetaminophen, albuterol, dextromethorphan-guaiFENesin, diphenhydrAMINE, hydrALAZINE, magnesium hydroxide, traZODone  Assessment/ Plan:  Ms. Puneet Selden is a 47 y.o.  female with past medical history of hypertension, HIV, HCV, tobacco abuse, pancytopenia, end-stage renal disease on hemodialysis.  Patient presents to the emergency department with leg swelling. She has been admitted under observation for Shortness of breath [R06.02] Peripheral edema [R60.0] Pleural effusion [J90] Other ascites [R18.8] Fluid overload [E87.70]  [facility]/TTS/right chest PermCath  Fluid volume overload with end stage renal disease on hemodialysis. Two treatments missed last week. Chest xray shows small pleural effusion.  Scheduled dialysis today, UF 2.5-3L as tolerated.  Next treatment scheduled for Thursday. Will defer discharge plan to primary team.   2. Anemia of chronic kidney disease Lab Results  Component Value Date   HGB 11.1 (L) 10/06/2022    Hgb at goal. Patient received Mircera outpatient.   3. Secondary Hyperparathyroidism: with outpatient labs: PTH 898, phosphorus 4.3, calcium 9.0 on 09/22/22.   Lab Results  Component Value Date   CALCIUM 8.2 (L) 10/06/2022   PHOS 6.7 (H) 10/06/2022   Calcium at goal, but phosphorus elevated.  Patient prescribed calcium  acetate with meals outpatient.  4.  Hypertension with chronic kidney disease.  Home regimen includes carvedilol and furosemide.  Receiving these medications.   LOS: 0 Rucker Pridgeon 4/23/202412:09 PM

## 2022-10-06 NOTE — Discharge Summary (Signed)
Physician Discharge Summary   Patient: Rebecca Bridges MRN: [id number]  DOB: May 31, 1976   Admit:     Date of Admission: 10/05/2022 Admitted from: home   Discharge: Date of discharge: 10/06/22 Disposition: Home Condition at discharge: good  CODE STATUS: FULL CODE      Discharge Physician: Sunnie Nielsen, DO [facility]     PCP: [facility]  Recommendations for Outpatient Follow-up:  Follow up with PCP [place] in 1-2 weeks Please obtain labs/tests: as needed Please follow up on the following pending results: final culture results  PCP AND OTHER OUTPATIENT PROVIDERS: SEE BELOW FOR SPECIFIC DISCHARGE INSTRUCTIONS PRINTED FOR PATIENT IN ADDITION TO GENERIC AVS PATIENT INFO     Discharge Instructions     Diet - low sodium heart healthy   Complete by: As directed    Increase activity slowly   Complete by: As directed          Discharge Diagnoses: Principal Problem:   Fluid overload Active Problems:   ESRD on dialysis   Chronic systolic CHF (congestive heart failure)   Liver cirrhosis   COPD (chronic obstructive pulmonary disease)   Human immunodeficiency virus (HIV) disease   HTN (hypertension), malignant   Tobacco abuse   Embolism of artery of lower extremity   Pulmonary embolism   Pancytopenia   HCV (hepatitis C virus)   Prolonged Q-T interval on ECG       Hospital Course: Rebecca Bridges is a 47 y.o. female with medical history significant of ESRD-HD (TTS), sCHF with EF<15%, liver cirrhosis with ascites requiring paracentesis, hypertension, COPD, CAD, anemia, PE on Eliquis, pancytopenia, HIV, HCV (CD4 was 104, viral load <20 on 05/28/2022), left bundle blockade, embolism of lower extremity artery  HPI:  abdominal distention worsening in the past several days.  S/p paracentesis 4/15.  HD Thursday 4/18, but missed dialysis on Saturday 4/20.  She has shortness of breath. No eliquis x2 days PTA 04/22: ascites, shortness of  breath, leg edema, positive JVD, clinically consistent with fluid overload. Admitted for observation, consult to nephrology. paracentesis removed 600 mL 04/23: hemodialysis removed 2L. Stable for discharge   Consultants:  Nephrology   Procedures: Routine hemodialysis 04/23 04/22 paracentesis      ASSESSMENT & PLAN:   Fluid overload: Removed 2L fluid in dialysis today  Encourage adherence to her usual HD schedule    ESRD on dialysis Encourage adherence to her usual HD schedule    Chronic systolic CHF (congestive heart failure): 2D echo on 11/21/2021 showed EF<15%.  No history of fluid overload.  BNP >4500 Encourage adherence to her usual HD schedule    Liver cirrhosis and ascites: Mental status normal S/p paracentesis   COPD (chronic obstructive pulmonary disease) As needed bronchodilators   Human immunodeficiency virus (HIV) disease: CD4 = 104 and VL < 20 on 05/28/22 Continue home medications   HTN (hypertension), malignant - improved to BP WNL Coreg, Lasix   Tobacco abuse Nicotine patch   Embolism of artery of lower extremity and Pulmonary embolism Eliquis   Pancytopenia: Hemoglobin stable 11.9 Follow-up with CBC   HCV (hepatitis C virus) Continue home Sofosbuvir-Velpatasvir   Prolonged Q-T interval on ECG: QTc 584. Avoid using QT prolonging medications, such as Zofran            Discharge Instructions  Allergies as of 10/06/2022       Reactions   Neosporin [bacitracin-polymyxin B] Rash   Sulfa Antibiotics Rash  Medication List     TAKE these medications    apixaban 5 MG Tabs tablet Commonly known as: ELIQUIS Take 5 mg by mouth 2 (two) times daily.   bictegravir-emtricitabine-tenofovir AF 50-200-25 MG Tabs tablet Commonly known as: BIKTARVY Take 1 tablet by mouth at bedtime.   calcium acetate 667 MG capsule Commonly known as: PHOSLO Take 2,001 mg by mouth 3 (three) times daily.   carvedilol 12.5 MG tablet Commonly known  as: COREG Take 12.5 mg by mouth 2 (two) times daily with a meal.   furosemide 80 MG tablet Commonly known as: LASIX Take 80 mg by mouth 2 (two) times daily.   gabapentin 300 MG capsule Commonly known as: NEURONTIN Take 300 mg by mouth at bedtime.   multivitamin with minerals Tabs tablet Take 1 tablet by mouth daily.   pantoprazole 40 MG tablet Commonly known as: PROTONIX Take 40 mg by mouth daily.   prednisoLONE acetate 1 % ophthalmic suspension Commonly known as: PRED FORTE Place 1 drop into the right eye at bedtime.   Rena-Vite Rx 1 MG Tabs Take 1 tablet by mouth daily.   Sofosbuvir-Velpatasvir 400-100 MG Tabs Take 1 tablet by mouth daily.   traZODone 50 MG tablet Commonly known as: DESYREL SMARTSIG:1 Tablet(s) By Mouth Every Evening          Allergies  Allergen Reactions   Neosporin [Bacitracin-Polymyxin B] Rash   Sulfa Antibiotics Rash     Subjective: pt denies CP/SOB, no other pain or concerns.    Discharge Exam: BP 136/84 (BP Location: Right Arm)   Pulse 68   Temp 97.8 F (36.6 C)   Resp 16   Ht 5\' 3"  (1.6 m)   Wt 49.2 kg   LMP 05/09/2019   SpO2 100%   BMI 19.21 kg/m  General: Pt is alert, awake, not in acute distress Cardiovascular: RRR, S1/S2 +, no rubs, no gallops Respiratory: CTA bilaterally, no wheezing, no rhonchi Abdominal: Soft, NT, ND, bowel sounds + Extremities: no edema, no cyanosis     The results of significant diagnostics from this hospitalization (including imaging, microbiology, ancillary and laboratory) are listed below for reference.     Microbiology: Recent Results (from the past 240 hour(s))  Body fluid culture w Gram Stain     Status: None (Preliminary result)   Collection Time: 10/05/22  3:02 PM   Specimen: PATH Cytology Peritoneal fluid  Result Value Ref Range Status   Specimen Description   Final    PERITONEAL Performed at [facility], [address] [place], [place] [address]    Special  Requests   Final    NONE Performed at [facility], [address] [place], [place] [address]    Gram Stain NO WBC SEEN NO ORGANISMS SEEN   Final   Culture   Final    NO GROWTH < 24 HOURS Performed at [facility], [address], [place], [place] [address]    Report Status PENDING  Incomplete     Labs: BNP (last 3 results) Recent Labs    01/17/22 0851 01/19/22 0651 10/04/22 2202  BNP >4,500.0* >4,500.0* >4,500.0*   Basic Metabolic Panel: Recent Labs  Lab 10/04/22 2202 10/06/22 0810  NA 138 136  K 4.9 5.3*  CL 104 103  CO2 25 22  GLUCOSE 100* 101*  BUN 44* 53*  CREATININE 8.46* 9.47*  CALCIUM 8.4* 8.2*  PHOS  --  6.7*   Liver Function Tests: Recent Labs  Lab 10/04/22 2202  10/06/22 0810  AST 25  --   ALT 9  --   ALKPHOS 56  --   BILITOT 1.1  --   PROT 8.0  --   ALBUMIN 3.4* 3.3*   No results for input(s): "LIPASE", "AMYLASE" in the last 168 hours. Recent Labs  Lab 10/05/22 0849  AMMONIA 19   CBC: Recent Labs  Lab 10/04/22 2202 10/05/22 1515 10/06/22 0459  WBC 3.7* 3.8* 2.7*  NEUTROABS 2.7  --   --   HGB 11.9* 11.8* 11.1*  HCT 38.7 37.4 34.3*  MCV 113.2* 110.7* 109.6*  PLT 74* 77* 58*   Cardiac Enzymes: No results for input(s): "CKTOTAL", "CKMB", "CKMBINDEX", "TROPONINI" in the last 168 hours. BNP: Invalid input(s): "POCBNP" CBG: No results for input(s): "GLUCAP" in the last 168 hours. D-Dimer No results for input(s): "DDIMER" in the last 72 hours. Hgb A1c No results for input(s): "HGBA1C" in the last 72 hours. Lipid Profile No results for input(s): "CHOL", "HDL", "LDLCALC", "TRIG", "CHOLHDL", "LDLDIRECT" in the last 72 hours. Thyroid function studies No results for input(s): "TSH", "T4TOTAL", "T3FREE", "THYROIDAB" in the last 72 hours.  Invalid input(s): "FREET3" Anemia work up No results for input(s): "VITAMINB12", "FOLATE", "FERRITIN", "TIBC", "IRON", "RETICCTPCT" in the last 72 hours. Urinalysis    Component  Value Date/Time   COLORURINE YELLOW (A) 06/13/2019 1150   APPEARANCEUR HAZY (A) 06/13/2019 1150   LABSPEC 1.010 06/13/2019 1150   PHURINE 6.0 06/13/2019 1150   GLUCOSEU NEGATIVE 06/13/2019 1150   HGBUR NEGATIVE 06/13/2019 1150   BILIRUBINUR NEGATIVE 06/13/2019 1150   KETONESUR NEGATIVE 06/13/2019 1150   PROTEINUR >=300 (A) 06/13/2019 1150   NITRITE NEGATIVE 06/13/2019 1150   LEUKOCYTESUR SMALL (A) 06/13/2019 1150   Sepsis Labs Recent Labs  Lab 10/04/22 2202 10/05/22 1515 10/06/22 0459  WBC 3.7* 3.8* 2.7*   Microbiology Recent Results (from the past 240 hour(s))  Body fluid culture w Gram Stain     Status: None (Preliminary result)   Collection Time: 10/05/22  3:02 PM   Specimen: PATH Cytology Peritoneal fluid  Result Value Ref Range Status   Specimen Description   Final    PERITONEAL Performed at [facility], [address], [place], [place] [address]    Special Requests   Final    NONE Performed at [facility], [address] [place], [place] [address]    Gram Stain NO WBC SEEN NO ORGANISMS SEEN   Final   Culture   Final    NO GROWTH < 24 HOURS Performed at [facility], [address] [place], [place] [address]    Report Status PENDING  Incomplete   Imaging No results found.    Time coordinating discharge: over 30 minutes  SIGNED:  Sunnie Nielsen DO [facility]

## 2022-10-06 NOTE — Patient Instructions (Signed)
Your procedure is scheduled on: Wednesday 10/14/22 To find out your arrival time, please call [phone] between 1PM - 3PM on:   Tuesday 10/13/22 Report to the Registration Desk on the 1st floor of the [facility]. Valet parking is available.  If your arrival time is 6:00 am, do not arrive before that time as the [facility] entrance doors do not open until 6:00 am.  REMEMBER: Instructions that are not followed completely may result in serious medical risk, up to and including death; or upon the discretion of your surgeon and anesthesiologist your surgery may need to be rescheduled.  Do not eat food or drink any liquids after midnight the night before surgery.  No gum chewing or hard candies.  One week prior to surgery: Stop Anti-inflammatories (NSAIDS) such as Advil, Aleve, Ibuprofen, Motrin, Naproxen, Naprosyn and Aspirin based products such as Excedrin, Goody's Powder, BC Powder. You may however, continue to take Tylenol if needed for pain up until the day of surgery.  Stop ANY OVER THE COUNTER supplements or vitamins until after surgery.  Continue taking all prescribed medications.  TAKE ONLY THESE MEDICATIONS THE MORNING OF SURGERY WITH A SIP OF WATER:  bictegravir-emtricitabine-tenofovir AF (BIKTARVY) 50-200-25 MG TABS tablet  carvedilol (COREG) 12.5 MG tablet  pantoprazole (PROTONIX) 40 MG tablet   No Alcohol for 24 hours before or after surgery.  No Smoking including e-cigarettes for 24 hours before surgery.  No chewable tobacco products for at least 6 hours before surgery.  No nicotine patches on the day of surgery.  Do not use any "recreational" drugs for at least a week (preferably 2 weeks) before your surgery.  Please be advised that the combination of cocaine and anesthesia may have negative outcomes, up to and including death. If you test positive for cocaine, your surgery will be cancelled.  On the morning of surgery brush your teeth with toothpaste and water,  you may rinse your mouth with mouthwash if you wish. Do not swallow any toothpaste or mouthwash.  Use CHG Soap or wipes as directed on instruction sheet.  Do not wear lotions, powders, or perfumes. No deodorant.  Do not shave body hair from the neck down 48 hours before surgery.  Wear comfortable clothing (specific to your surgery type) to the hospital.  Do not wear jewelry, make-up, hairpins, clips or nail polish.  Contact lenses, hearing aids and dentures may not be worn into surgery.  Do not bring valuables to the hospital. [facility] is not responsible for any missing/lost belongings or valuables.   Notify your doctor if there is any change in your medical condition (cold, fever, infection).  If you are being discharged the day of surgery, you will not be allowed to drive home. You will need a responsible individual to drive you home and stay with you for 24 hours after surgery.   If you are taking public transportation, you will need to have a responsible individual with you.  If you are being admitted to the hospital overnight, leave your suitcase in the car. After surgery it may be brought to your room.  In case of increased patient census, it may be necessary for you, the patient, to continue your postoperative care in the Same Day Surgery department.  After surgery, you can help prevent lung complications by doing breathing exercises.  Take deep breaths and cough every 1-2 hours. Your doctor may order a device called an Incentive Spirometer to help you take deep breaths. When coughing or sneezing,  hold a pillow firmly against your incision with both hands. This is called "splinting." Doing this helps protect your incision. It also decreases belly discomfort.  Surgery Visitation Policy:  Patients undergoing a surgery or procedure may have two family members or support persons with them as long as the person is not COVID-19 positive or experiencing its symptoms.    Inpatient Visitation:    Visiting hours are 7 a.m. to 8 p.m. Up to four visitors are allowed at one time in a patient room. The visitors may rotate out with other people during the day. One designated support person (adult) may remain overnight.  Please call the Pre-admissions Testing Dept. at [phone] if you have any questions about these instructions.     Preparing for Surgery with CHLORHEXIDINE GLUCONATE (CHG) Soap  Chlorhexidine Gluconate (CHG) Soap  o An antiseptic cleaner that kills germs and bonds with the skin to continue killing germs even after washing  o Used for showering the night before surgery and morning of surgery  Before surgery, you can play an important role by reducing the number of germs on your skin.  CHG (Chlorhexidine gluconate) soap is an antiseptic cleanser which kills germs and bonds with the skin to continue killing germs even after washing.  Please do not use if you have an allergy to CHG or antibacterial soaps. If your skin becomes reddened/irritated stop using the CHG.  1. Shower the NIGHT BEFORE SURGERY and the MORNING OF SURGERY with CHG soap.  2. If you choose to wash your hair, wash your hair first as usual with your normal shampoo.  3. After shampooing, rinse your hair and body thoroughly to remove the shampoo.  4. Use CHG as you would any other liquid soap. You can apply CHG directly to the skin and wash gently with a scrungie or a clean washcloth.  5. Apply the CHG soap to your body only from the neck down. Do not use on open wounds or open sores. Avoid contact with your eyes, ears, mouth, and genitals (private parts). Wash face and genitals (private parts) with your normal soap.  6. Wash thoroughly, paying special attention to the area where your surgery will be performed.  7. Thoroughly rinse your body with warm water.  8. Do not shower/wash with your normal soap after using and rinsing off the CHG soap.  9. Pat yourself dry  with a clean towel.  10. Wear clean pajamas to bed the night before surgery.  12. Place clean sheets on your bed the night of your first shower and do not sleep with pets.  13. Shower again with the CHG soap on the day of surgery prior to arriving at the hospital.  14. Do not apply any deodorants/lotions/powders.  15. Please wear clean clothes to the hospital.

## 2022-10-06 NOTE — Hospital Course (Addendum)
Rebecca Bridges is a 47 y.o. female with medical history significant of ESRD-HD (TTS), sCHF with EF<15%, liver cirrhosis with ascites requiring paracentesis, hypertension, COPD, CAD, anemia, PE on Eliquis, pancytopenia, HIV, HCV (CD4 was 104, viral load <20 on 05/28/2022), left bundle blockade, embolism of lower extremity artery  HPI:  abdominal distention worsening in the past several days.  S/p paracentesis 4/15.  HD Thursday 4/18, but missed dialysis on Saturday 4/20.  She has shortness of breath. No eliquis x2 days PTA 04/22: ascites, shortness of breath, leg edema, positive JVD, clinically consistent with fluid overload. Admitted for observation, consult to nephrology. paracentesis removed 600 mL 04/23: hemodialysis removed 2L.   Consultants:  Nephrology   Procedures: Routine hemodialysis 04/23 04/22 paracentesis      ASSESSMENT & PLAN:   Principal Problem:   Fluid overload Active Problems:   ESRD on dialysis   Chronic systolic CHF (congestive heart failure)   Liver cirrhosis   COPD (chronic obstructive pulmonary disease)   Human immunodeficiency virus (HIV) disease   HTN (hypertension), malignant   Tobacco abuse   Embolism of artery of lower extremity   Pulmonary embolism   Pancytopenia   HCV (hepatitis C virus)   Prolonged Q-T interval on ECG     Fluid overload: Removed 2L fluid in dialysis today  Encourage adherence to her usual HD schedule    ESRD on dialysis Encourage adherence to her usual HD schedule    Chronic systolic CHF (congestive heart failure): 2D echo on 11/21/2021 showed EF<15%.  No history of fluid overload.  BNP >4500 Encourage adherence to her usual HD schedule    Liver cirrhosis and ascites: Mental status normal S/p paracentesis   COPD (chronic obstructive pulmonary disease) As needed bronchodilators   Human immunodeficiency virus (HIV) disease: CD4 = 104 and VL < 20 on 05/28/22 Continue home medications   HTN (hypertension), malignant -  improved to BP WNL Coreg, Lasix   Tobacco abuse Nicotine patch   Embolism of artery of lower extremity and Pulmonary embolism Eliquis   Pancytopenia: Hemoglobin stable 11.9 Follow-up with CBC   HCV (hepatitis C virus) Continue home Sofosbuvir-Velpatasvir   Prolonged Q-T interval on ECG: QTc 584. Avoid using QT prolonging medications, such as Zofran      DVT prophylaxis: *** Pertinent IV fluids/nutrition: *** Central lines / invasive devices: ***  Code Status: *** ACP documentation reviewed: @ 8:34 AM / none on file   Current Admission StatusCam [place] needs / Dispo plan: *** Barriers to discharge / significant pending items: ***

## 2022-10-06 NOTE — Plan of Care (Signed)
  Problem: Education: Goal: Knowledge of General Education information will improve Description: Including pain rating scale, medication(s)/side effects and non-pharmacologic comfort measures 10/06/2022 1346 by Genia Hotter, RN Outcome: Adequate for Discharge 10/06/2022 [phone] by Genia Hotter, RN Outcome: Progressing   Problem: Health Behavior/Discharge Planning: Goal: Ability to manage health-related needs will improve 10/06/2022 1346 by Genia Hotter, RN Outcome: Adequate for Discharge 10/06/2022 [phone] by Genia Hotter, RN Outcome: Progressing   Problem: Clinical Measurements: Goal: Ability to maintain clinical measurements within normal limits will improve 10/06/2022 1346 by Genia Hotter, RN Outcome: Adequate for Discharge 10/06/2022 [phone] by Genia Hotter, RN Outcome: Progressing Goal: Will remain free from infection 10/06/2022 1346 by Genia Hotter, RN Outcome: Adequate for Discharge 10/06/2022 [phone] by Genia Hotter, RN Outcome: Progressing Goal: Diagnostic test results will improve 10/06/2022 1346 by Genia Hotter, RN Outcome: Adequate for Discharge 10/06/2022 [phone] by Genia Hotter, RN Outcome: Progressing Goal: Respiratory complications will improve 10/06/2022 1346 by Genia Hotter, RN Outcome: Adequate for Discharge 10/06/2022 [phone] by Genia Hotter, RN Outcome: Progressing Goal: Cardiovascular complication will be avoided 10/06/2022 1346 by Genia Hotter, RN Outcome: Adequate for Discharge 10/06/2022 0821 by Genia Hotter, RN Outcome: Progressing   Problem: Activity: Goal: Risk for activity intolerance will decrease 10/06/2022 1346 by Genia Hotter, RN Outcome: Adequate for Discharge 10/06/2022 [phone] by Genia Hotter, RN Outcome: Progressing   Problem: Nutrition: Goal: Adequate nutrition will be maintained 10/06/2022 1346 by Genia Hotter, RN Outcome: Adequate for Discharge 10/06/2022 [phone] by Genia Hotter, RN Outcome: Progressing   Problem: Coping: Goal: Level of anxiety  will decrease 10/06/2022 1346 by Genia Hotter, RN Outcome: Adequate for Discharge 10/06/2022 [phone] by Genia Hotter, RN Outcome: Progressing   Problem: Elimination: Goal: Will not experience complications related to bowel motility 10/06/2022 1346 by Genia Hotter, RN Outcome: Adequate for Discharge 10/06/2022 [phone] by Genia Hotter, RN Outcome: Progressing Goal: Will not experience complications related to urinary retention 10/06/2022 1346 by Genia Hotter, RN Outcome: Adequate for Discharge 10/06/2022 [phone] by Genia Hotter, RN Outcome: Progressing   Problem: Pain Managment: Goal: General experience of comfort will improve 10/06/2022 1346 by Genia Hotter, RN Outcome: Adequate for Discharge 10/06/2022 [phone] by Genia Hotter, RN Outcome: Progressing   Problem: Safety: Goal: Ability to remain free from injury will improve 10/06/2022 1346 by Genia Hotter, RN Outcome: Adequate for Discharge 10/06/2022 [phone] by Genia Hotter, RN Outcome: Progressing   Problem: Skin Integrity: Goal: Risk for impaired skin integrity will decrease 10/06/2022 1346 by Genia Hotter, RN Outcome: Adequate for Discharge 10/06/2022 [phone] by Genia Hotter, RN Outcome: Progressing

## 2022-10-07 LAB — BODY FLUID CULTURE W GRAM STAIN

## 2022-10-07 LAB — CYTOLOGY - NON PAP

## 2022-10-08 ENCOUNTER — Encounter: Payer: Self-pay | Admitting: Vascular Surgery

## 2022-10-08 NOTE — Pre-Procedure Instructions (Signed)
Left VM to remind Ms Kable to hold her Eliquis for her upcoming vascular procedure with last dose being Saturday night 10/10/22.

## 2022-10-09 ENCOUNTER — Telehealth: Payer: Self-pay | Admitting: Urgent Care

## 2022-10-09 LAB — BODY FLUID CULTURE W GRAM STAIN

## 2022-10-09 LAB — MISC LABCORP TEST (SEND OUT): Labcorp test code: 9985

## 2022-10-09 NOTE — Progress Notes (Addendum)
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 Levora Dredge, MD.  Of note, patient was previously scheduled for the same procedure and 06/2022, however was ultimately cancelled due to need for further cardiac evaluation and clearance.  Patient was to see cardiology and let her surgeon's office know when she has seen them. To date, patient has not been seen by cardiology.   Patient status post recent admission for volume overload. She required hemodialysis during her admission. Patient also underwent a high volume yield paracentesis while she was here at [facility]. I received noted from PAT staff that patient had been rescheduled for her original procedure with Dr. Gilda Crease. Chart briefly reviewed. Again, patient has not been seen by cardiology/EP since 11/2021. Additionally, patient has not been seen in follow up consult by vascular. Reached out to vascular APP to discuss. I advised Rebecca Passey, NP that I was making efforts to obtain clearance/risk stratification from cardiology, however it has been quite sometime since the patient has been seen by that particular service line. With recent admission and length of time since her last visit, I anticipate that patient would need to be seen in clinic for further evaluation and discussions regarding upcoming procedure.   Received return communication from patient's cardiologist this afternoon (10/09/2022). Note from cardiology office stated, "Dr. Darin Engels has not seen this patient since 04/2021. Unable to clear patient. Please contact another current provider". Follow up communication with cardiology to explain the dilemma that exists with this patient. Explained that anesthesia service is requesting clearance/risk  stratification on her prior to proceeding. Mentioned that Dr. Maisie Fus (EP) had seen her in 11/2021. Discussed that patient will need a visit with one of these providers in order to be fully evaluated and cleared prior to surgery.   Plan:  Rebecca Bridges has been rescheduled for procedure with vascular. I have reached out again to cardiology today. I was able to speak with cardiology nurse coordinator Rebecca Neth, RN) who was most helpful in facilitating conversation between anesthesia here at [facility] and Dr. Darin Engels. Dr. Darin Engels has graciously agreed to see patient in expedited consult, however the first available appointment is 10/26/2022. I asked that this appointment be secured for this patient. Office to reach out to the patient to confirm appointment details with her. It is imperative that patient attend this appointment as scheduled without fail, as she cannot proceed with surgery without disposition from cardiology service.   Patient has a significant cardiovascular history. Above details, have been discussed with anesthesia, both back in January of this year, and then again today Rebecca Ngo, MD). With patient's history, anesthesia really feels as if she would be served by a larger center with advanced cardiovascular services. I have specifically asked cardiology and/or EP to comment on the whether or not patient is felt to be appropriate for our facility with her advanced cardiac history.   Vascular APP updated on the plans for cardiology evaluation and conversation between myself and anesthesia; copy of this note was forwarded. Anesthesia stands by the recommendation that consideration be lent to her care being transferred to a provider with access to the medical services/amenities of a tertiary care center. Pending recommendations from cardiology at this time; surgery date will need to be postponed. Will plan on following up on disposition from cardiology as  new information is made available.   Rebecca Mulling, MSN, APRN, FNP-C, CEN [facility]  Peri-operative Services Nurse Practitioner Phone: [phone] 10/09/22 4:14 PM  NOTE: This note has been prepared using Dragon dictation software. Despite my best ability to proofread, there is always the potential that unintentional transcriptional errors may still occur from this process.

## 2022-10-14 ENCOUNTER — Ambulatory Visit: Admit: 2022-10-14 | Payer: 59 | Admitting: Vascular Surgery

## 2022-10-14 SURGERY — REVISON OF ARTERIOVENOUS FISTULA
Anesthesia: General | Laterality: Left

## 2022-10-26 ENCOUNTER — Ambulatory Visit (INDEPENDENT_AMBULATORY_CARE_PROVIDER_SITE_OTHER): Payer: Medicare Other | Admitting: Vascular Surgery

## 2022-10-27 ENCOUNTER — Other Ambulatory Visit: Payer: Self-pay | Admitting: Internal Medicine

## 2022-10-27 DIAGNOSIS — R188 Other ascites: Secondary | ICD-10-CM

## 2022-10-28 ENCOUNTER — Ambulatory Visit
Admission: RE | Admit: 2022-10-28 | Discharge: 2022-10-28 | Disposition: A | Discharge status: Home / Self Care | Payer: 59 | Source: Ambulatory Visit | Attending: Internal Medicine | Admitting: Internal Medicine

## 2022-10-28 DIAGNOSIS — R188 Other ascites: Secondary | ICD-10-CM | POA: Diagnosis present

## 2022-10-28 MED ORDER — LIDOCAINE HCL (PF) 1 % IJ SOLN
10.0000 mL | Freq: Once | INTRAMUSCULAR | Status: AC
  Administered 2022-10-28: 10 mL via SUBCUTANEOUS
  Filled 2022-10-28: qty 10

## 2022-10-28 NOTE — Procedures (Signed)
PROCEDURE SUMMARY:  Successful image-guided paracentesis from the left lower abdomen.  Yielded 2.85 liters of amber fluid.  No immediate complications.  EBL = trace. Patient tolerated well.   Specimen was not sent for labs.  Please see imaging section of Epic for full dictation.   Kennieth Francois PA-C 10/28/2022 12:01 PM

## 2022-11-28 ENCOUNTER — Emergency Department: Payer: 59

## 2022-11-28 ENCOUNTER — Emergency Department
Admission: EM | Admit: 2022-11-28 | Discharge: 2022-11-28 | Disposition: A | Discharge status: Home / Self Care | Payer: 59 | Attending: Emergency Medicine | Admitting: Emergency Medicine

## 2022-11-28 ENCOUNTER — Other Ambulatory Visit: Payer: Self-pay

## 2022-11-28 DIAGNOSIS — N186 End stage renal disease: Secondary | ICD-10-CM | POA: Diagnosis not present

## 2022-11-28 DIAGNOSIS — I2510 Atherosclerotic heart disease of native coronary artery without angina pectoris: Secondary | ICD-10-CM | POA: Diagnosis not present

## 2022-11-28 DIAGNOSIS — R140 Abdominal distension (gaseous): Secondary | ICD-10-CM | POA: Insufficient documentation

## 2022-11-28 DIAGNOSIS — R188 Other ascites: Secondary | ICD-10-CM

## 2022-11-28 DIAGNOSIS — J449 Chronic obstructive pulmonary disease, unspecified: Secondary | ICD-10-CM | POA: Diagnosis not present

## 2022-11-28 DIAGNOSIS — Z21 Asymptomatic human immunodeficiency virus [HIV] infection status: Secondary | ICD-10-CM | POA: Diagnosis not present

## 2022-11-28 DIAGNOSIS — R0602 Shortness of breath: Secondary | ICD-10-CM | POA: Insufficient documentation

## 2022-11-28 DIAGNOSIS — Z992 Dependence on renal dialysis: Secondary | ICD-10-CM | POA: Diagnosis not present

## 2022-11-28 LAB — COMPREHENSIVE METABOLIC PANEL
ALT: 8 U/L (ref 0–44)
AST: 22 U/L (ref 15–41)
Albumin: 3.7 g/dL (ref 3.5–5.0)
Alkaline Phosphatase: 53 U/L (ref 38–126)
Anion gap: 11 (ref 5–15)
BUN: 20 mg/dL (ref 6–20)
CO2: 23 mmol/L (ref 22–32)
Calcium: 8.1 mg/dL — ABNORMAL LOW (ref 8.9–10.3)
Chloride: 102 mmol/L (ref 98–111)
Creatinine, Ser: 4.98 mg/dL — ABNORMAL HIGH (ref 0.44–1.00)
GFR, Estimated: 10 mL/min — ABNORMAL LOW (ref >60)
Glucose, Bld: 107 mg/dL — ABNORMAL HIGH (ref 70–99)
Potassium: 3.5 mmol/L (ref 3.5–5.1)
Sodium: 136 mmol/L (ref 135–145)
Total Bilirubin: 1 mg/dL (ref 0.3–1.2)
Total Protein: 8.7 g/dL — ABNORMAL HIGH (ref 6.5–8.1)

## 2022-11-28 LAB — CBC
HCT: 35.2 % — ABNORMAL LOW (ref 36.0–46.0)
Hemoglobin: 11 g/dL — ABNORMAL LOW (ref 12.0–15.0)
MCH: 34.6 pg — ABNORMAL HIGH (ref 26.0–34.0)
MCHC: 31.3 g/dL (ref 30.0–36.0)
MCV: 110.7 fL — ABNORMAL HIGH (ref 80.0–100.0)
Platelets: 61 10*3/uL — ABNORMAL LOW (ref 150–400)
RBC: 3.18 MIL/uL — ABNORMAL LOW (ref 3.87–5.11)
RDW: 15.1 % (ref 11.5–15.5)
WBC: 3 10*3/uL — ABNORMAL LOW (ref 4.0–10.5)
nRBC: 0 % (ref 0.0–0.2)

## 2022-11-28 LAB — LIPASE, BLOOD: Lipase: 40 U/L (ref 11–51)

## 2022-11-28 NOTE — ED Triage Notes (Addendum)
Patient states she had dialysis this morning and was sent over for increased sob and abdominal swelling; history of needing a paracentesis.

## 2022-11-28 NOTE — Discharge Instructions (Addendum)
Dr. Deanne Coffer and his team of interventional radiology reach out to you on Monday morning to schedule a time to get fluid removed from your abdomen.  In the meantime, if you experience any new, worsening, or unexpected symptoms call your doctor right away return to the emergency department for reevaluation.

## 2022-11-28 NOTE — ED Provider Notes (Signed)
[facility] Provider Note    Event Date/Time   First MD Initiated Contact with Patient 11/28/22 1550     (approximate)   History   Shortness of Breath   HPI  Rebecca Bridges is a 47 y.o. female   Past medical history of end-stage renal disease on hemodialysis last session was today, cirrhosis and ascites requiring repeated therapeutic paracentesis, COPD, HIV, CAD, presents to the emergency department with abdominal distention worsening over the last several weeks, tightness in the abdomen, mild shortness of breath and the sensation that she needs another therapeutic paracentesis to take fluid off.  Her last paracentesis was Oct 28, 2018 took off 2.5 L.  She denies fever, cough, chest pain.  She has no other acute medical complaints.  External Medical Documents Reviewed: Interventional radiology note from 10/28/2022 for image guided paracentesis left lower abdomen yielding 2.85 L of amber fluid      Physical Exam   Triage Vital Signs: ED Triage Vitals [11/28/22 1311]  Enc Vitals Group     BP (!) 135/93     Pulse Rate 77     Resp 20     Temp 98.1 F (36.7 C)     Temp Source Oral     SpO2 96 %     Weight 120 lb (54.4 kg)     Height 5\' 3"  (1.6 m)     Head Circumference      Peak Flow      Pain Score 9     Pain Loc      Pain Edu?      Excl. in GC?     Most recent vital signs: Vitals:   11/28/22 1311  BP: (!) 135/93  Pulse: 77  Resp: 20  Temp: 98.1 F (36.7 C)  SpO2: 96%    General: Awake, no distress.  CV:  Good peripheral perfusion.  Resp:  Normal effort.  Abd:  Moderate abdominal distention, soft and nontender without rigidity or guarding to palpation.   Other:  No fever.  Appears well nontoxic.  No peripheral edema, clear lungs without rales or focality.  Normal respirations no increased work of breathing comfortable   ED Results / Procedures / Treatments   Labs (all labs ordered are listed, but only abnormal results are  displayed) Labs Reviewed  COMPREHENSIVE METABOLIC PANEL - Abnormal; Notable for the following components:      Result Value   Glucose, Bld 107 (*)    Creatinine, Ser 4.98 (*)    Calcium 8.1 (*)    Total Protein 8.7 (*)    GFR, Estimated 10 (*)    All other components within normal limits  CBC - Abnormal; Notable for the following components:   WBC 3.0 (*)    RBC 3.18 (*)    Hemoglobin 11.0 (*)    HCT 35.2 (*)    MCV 110.7 (*)    MCH 34.6 (*)    Platelets 61 (*)    All other components within normal limits  LIPASE, BLOOD     I ordered and reviewed the above labs they are notable for creatinine is 4.98, cell counts at baseline compared to prior testing   RADIOLOGY I independently reviewed and interpreted chest x-ray and see no obvious focality or pneumothorax   PROCEDURES:  Critical Care performed: No  Procedures   MEDICATIONS ORDERED IN ED: Medications - No data to display  External physician / consultants:  I spoke with Dr Deanne Coffer of IR  regarding care plan for this patient.   IMPRESSION / MDM / ASSESSMENT AND PLAN / ED COURSE  I reviewed the triage vital signs and the nursing notes.                                Patient's presentation is most consistent with acute presentation with potential threat to life or bodily function.  Differential diagnosis includes, but is not limited to, abdominal distention due to ascites in the setting of cirrhosis, SBP, respiratory infection, pneumonia, pneumothorax   The patient is on the cardiac monitor to evaluate for evidence of arrhythmia and/or significant heart rate changes.  MDM: Patient with abdominal distention and discomfort requiring paracentesis therapeutic, very low clinical suspicion for SBP given 9 abdominal exam no fever.  Doubt pulmonary etiology given her abdominal distention and abdominal tightness consistent with ascites requiring therapeutic paracentesis in the past.  Normal lung exam.  Doubt pulmonary  edema or infection as cause of shortness of breath.   I spoke with Dr. Deanne Coffer of interventional radiology who will put the patient in for scheduled therapeutic paracentesis and will reach out to the patient to schedule this early next week.  Since she is in no acute distress and otherwise is stable I do not think she needs an emergent procedure today.  I explained this to the patient she is in agreement.  Plan for discharge with IR follow-up for paracentesis and she will return with any new or worsening         FINAL CLINICAL IMPRESSION(S) / ED DIAGNOSES   Final diagnoses:  None     Rx / DC Orders   ED Discharge Orders     None        Note:  This document was prepared using Dragon voice recognition software and may include unintentional dictation errors.    Pilar Jarvis, MD 11/28/22 [phone]

## 2022-11-30 ENCOUNTER — Ambulatory Visit
Admission: RE | Admit: 2022-11-30 | Discharge: 2022-11-30 | Disposition: A | Discharge status: Home / Self Care | Payer: 59 | Source: Ambulatory Visit | Attending: Internal Medicine | Admitting: Internal Medicine

## 2022-11-30 ENCOUNTER — Other Ambulatory Visit: Payer: Self-pay | Admitting: Internal Medicine

## 2022-11-30 DIAGNOSIS — R188 Other ascites: Secondary | ICD-10-CM | POA: Insufficient documentation

## 2022-11-30 MED ORDER — LIDOCAINE HCL (PF) 1 % IJ SOLN
10.0000 mL | Freq: Once | INTRAMUSCULAR | Status: AC
  Administered 2022-11-30: 10 mL via INTRADERMAL
  Filled 2022-11-30: qty 10

## 2022-11-30 MED ORDER — ALBUMIN HUMAN 25 % IV SOLN
25.0000 g | Freq: Once | INTRAVENOUS | Status: AC
  Administered 2022-11-30: 25 g via INTRAVENOUS

## 2022-11-30 MED ORDER — ALBUMIN HUMAN 25 % IV SOLN
INTRAVENOUS | Status: AC
  Filled 2022-11-30: qty 100

## 2022-11-30 NOTE — Discharge Instructions (Signed)
Discharge instructions given to patient after procedure completed

## 2022-11-30 NOTE — Procedures (Signed)
PROCEDURE SUMMARY:  Successful US guided paracentesis from left abdomen.  Yielded 3.7 L of amber-colored fluid.  No immediate complications.  Pt tolerated well.   Specimen not sent for labs.  EBL < 2 mL  Mickie Kay, NP 11/30/2022 3:26 PM

## 2022-12-10 ENCOUNTER — Other Ambulatory Visit: Payer: Self-pay | Admitting: Internal Medicine

## 2022-12-10 DIAGNOSIS — R188 Other ascites: Secondary | ICD-10-CM

## 2022-12-11 ENCOUNTER — Ambulatory Visit
Admission: RE | Admit: 2022-12-11 | Discharge: 2022-12-11 | Disposition: A | Discharge status: Home / Self Care | Payer: 59 | Source: Ambulatory Visit | Attending: Internal Medicine | Admitting: Internal Medicine

## 2022-12-11 DIAGNOSIS — R188 Other ascites: Secondary | ICD-10-CM | POA: Insufficient documentation

## 2022-12-11 MED ORDER — LIDOCAINE HCL (PF) 1 % IJ SOLN
10.0000 mL | Freq: Once | INTRAMUSCULAR | Status: AC
  Administered 2022-12-11: 10 mL via SUBCUTANEOUS
  Filled 2022-12-11: qty 10

## 2022-12-11 NOTE — Discharge Instructions (Signed)
Instructions reviewed with patient. PCS  

## 2022-12-21 ENCOUNTER — Other Ambulatory Visit: Payer: Self-pay | Admitting: Internal Medicine

## 2022-12-21 DIAGNOSIS — R188 Other ascites: Secondary | ICD-10-CM

## 2022-12-23 ENCOUNTER — Ambulatory Visit
Admission: RE | Admit: 2022-12-23 | Discharge: 2022-12-23 | Disposition: A | Discharge status: Home / Self Care | Payer: 59 | Source: Ambulatory Visit | Attending: Internal Medicine | Admitting: Internal Medicine

## 2022-12-23 DIAGNOSIS — R188 Other ascites: Secondary | ICD-10-CM | POA: Diagnosis present

## 2022-12-23 MED ORDER — LIDOCAINE HCL (PF) 1 % IJ SOLN
10.0000 mL | Freq: Once | INTRAMUSCULAR | Status: AC
  Administered 2022-12-23: 10 mL via INTRADERMAL
  Filled 2022-12-23: qty 10

## 2022-12-23 NOTE — Procedures (Signed)
PROCEDURE SUMMARY:  Successful US guided paracentesis from left abdomen.  Yielded 3.3 L of amber-colored fluid.  No immediate complications.  Pt tolerated well.   Specimen not sent for labs.  EBL < 2 mL  Mickie Kay, NP 12/23/2022 10:58 AM

## 2022-12-24 ENCOUNTER — Emergency Department: Payer: 59

## 2022-12-24 ENCOUNTER — Other Ambulatory Visit: Payer: Self-pay

## 2022-12-24 ENCOUNTER — Emergency Department
Admission: EM | Admit: 2022-12-24 | Discharge: 2022-12-24 | Disposition: A | Discharge status: Home / Self Care | Payer: 59 | Source: Home / Self Care | Attending: Emergency Medicine | Admitting: Emergency Medicine

## 2022-12-24 DIAGNOSIS — I509 Heart failure, unspecified: Secondary | ICD-10-CM | POA: Insufficient documentation

## 2022-12-24 DIAGNOSIS — N186 End stage renal disease: Secondary | ICD-10-CM | POA: Diagnosis not present

## 2022-12-24 DIAGNOSIS — Z21 Asymptomatic human immunodeficiency virus [HIV] infection status: Secondary | ICD-10-CM | POA: Diagnosis not present

## 2022-12-24 DIAGNOSIS — R0602 Shortness of breath: Secondary | ICD-10-CM | POA: Insufficient documentation

## 2022-12-24 DIAGNOSIS — I2510 Atherosclerotic heart disease of native coronary artery without angina pectoris: Secondary | ICD-10-CM | POA: Diagnosis not present

## 2022-12-24 DIAGNOSIS — E875 Hyperkalemia: Secondary | ICD-10-CM | POA: Insufficient documentation

## 2022-12-24 LAB — BASIC METABOLIC PANEL
Anion gap: 13 (ref 5–15)
BUN: 76 mg/dL — ABNORMAL HIGH (ref 6–20)
CO2: 19 mmol/L — ABNORMAL LOW (ref 22–32)
Calcium: 7.9 mg/dL — ABNORMAL LOW (ref 8.9–10.3)
Chloride: 102 mmol/L (ref 98–111)
Creatinine, Ser: 11.64 mg/dL — ABNORMAL HIGH (ref 0.44–1.00)
GFR, Estimated: 4 mL/min — ABNORMAL LOW (ref >60)
Glucose, Bld: 87 mg/dL (ref 70–99)
Potassium: 5.4 mmol/L — ABNORMAL HIGH (ref 3.5–5.1)
Sodium: 134 mmol/L — ABNORMAL LOW (ref 135–145)

## 2022-12-24 LAB — CBC
HCT: 41.6 % (ref 36.0–46.0)
Hemoglobin: 12.9 g/dL (ref 12.0–15.0)
MCH: 32.8 pg (ref 26.0–34.0)
MCHC: 31 g/dL (ref 30.0–36.0)
MCV: 105.9 fL — ABNORMAL HIGH (ref 80.0–100.0)
Platelets: 60 10*3/uL — ABNORMAL LOW (ref 150–400)
RBC: 3.93 MIL/uL (ref 3.87–5.11)
RDW: 14.6 % (ref 11.5–15.5)
WBC: 2.5 10*3/uL — ABNORMAL LOW (ref 4.0–10.5)
nRBC: 0 % (ref 0.0–0.2)

## 2022-12-24 LAB — TROPONIN I (HIGH SENSITIVITY): Troponin I (High Sensitivity): 38 ng/L — ABNORMAL HIGH (ref <18)

## 2022-12-24 LAB — BRAIN NATRIURETIC PEPTIDE: B Natriuretic Peptide: >4500 pg/mL — ABNORMAL HIGH (ref 0.0–100.0)

## 2022-12-24 MED ORDER — SODIUM ZIRCONIUM CYCLOSILICATE 10 G PO PACK
10.0000 g | PACK | Freq: Once | ORAL | Status: AC
  Administered 2022-12-24: 10 g via ORAL
  Filled 2022-12-24: qty 1

## 2022-12-24 NOTE — Discharge Instructions (Addendum)
Dialysis scheduled for tomorrow at 0530!

## 2022-12-24 NOTE — ED Provider Notes (Signed)
[facility] Provider Note    Event Date/Time   First MD Initiated Contact with Patient 12/24/22 1532     (approximate)   History   Shortness of Breath   HPI  Rebecca Bridges is a 47 y.o. female with a history of CAD, HIV, ESRD, CHF, liver cirrhosis who presents with shortness of breath with exertion.  Patient reports that she had dialysis on Tuesday and Wednesday of last week prior to going to [place] on vacation.  When she came back she felt bloated and needed to have paracentesis so she missed her dialysis 2 days ago.  She reports she feels shortness of breath with exertion but no shortness of breath at rest.  No chest pain.  No fevers chills or cough     Physical Exam   Triage Vital Signs: ED Triage Vitals [12/24/22 1324]  Encounter Vitals Group     BP (!) 142/108     Systolic BP Percentile      Diastolic BP Percentile      Pulse Rate 81     Resp 16     Temp 98.4 F (36.9 C)     Temp Source Oral     SpO2 100 %     Weight 54.4 kg (119 lb 14.9 oz)     Height 1.6 m (5\' 3" )     Head Circumference      Peak Flow      Pain Score 0     Pain Loc      Pain Education      Exclude from Growth Chart     Most recent vital signs: Vitals:   12/24/22 1324  BP: (!) 142/108  Pulse: 81  Resp: 16  Temp: 98.4 F (36.9 C)  SpO2: 100%     General: Awake, no distress.  CV:  Good peripheral perfusion.  Resp:  Normal effort.  No rales Abd:  Mild distention with fluid wave Other:     ED Results / Procedures / Treatments   Labs (all labs ordered are listed, but only abnormal results are displayed) Labs Reviewed  BASIC METABOLIC PANEL - Abnormal; Notable for the following components:      Result Value   Sodium 134 (*)    Potassium 5.4 (*)    CO2 19 (*)    BUN 76 (*)    Creatinine, Ser 11.64 (*)    Calcium 7.9 (*)    GFR, Estimated 4 (*)    All other components within normal limits  CBC - Abnormal; Notable for the following components:    WBC 2.5 (*)    MCV 105.9 (*)    Platelets 60 (*)    All other components within normal limits  BRAIN NATRIURETIC PEPTIDE - Abnormal; Notable for the following components:   B Natriuretic Peptide >4,500.0 (*)    All other components within normal limits  TROPONIN I (HIGH SENSITIVITY) - Abnormal; Notable for the following components:   Troponin I (High Sensitivity) 38 (*)    All other components within normal limits     EKG  ED ECG REPORT I, Jene Every, the attending physician, personally viewed and interpreted this ECG.  Date: 12/24/2022  Rhythm: normal sinus rhythm QRS Axis: normal Intervals: Abnormal ST/T Wave abnormalities: Abnormal Narrative Interpretation: no evidence of acute ischemia    RADIOLOGY Chest x-ray viewed interpret by me, no evidence of pulmonary edema, effusion noted    PROCEDURES:  Critical Care performed:   Procedures  MEDICATIONS ORDERED IN ED: Medications  sodium zirconium cyclosilicate (LOKELMA) packet 10 g (10 g Oral Given 12/24/22 1626)     IMPRESSION / MDM / ASSESSMENT AND PLAN / ED COURSE  I reviewed the triage vital signs and the nursing notes. Patient's presentation is most consistent with exacerbation of chronic illness.  Patient presents with shortness of breath as detailed above.  No fevers or chills.  No chest pain.  She has not had dialysis in over a week, she feels that she needs dialysis and try to do this as an outpatient but they told her because it had been more than a week she needed to go to the hospital.  She had paracentesis done yesterday and her abdominal bloating is much better.  She has no fevers or chills to suggest pneumonia.  She has no chest pain to suggest ACS.  Lab work noted, elevated BNP consistent with history of CHF, end-stage renal disease, mildly elevated troponin consistent with end-stage renal disease  Discussed with Dr. Cherylann Ratel of nephrology who recommends p.o. Lokelma and he will see if dialysis can  be arranged for tonight or early tomorrow in the morning  Dr. Cherylann Ratel has arranged for outpatient dialysis tomorrow at 5:30 in the morning, discussed with the patient and she agrees with this plan.  No indication for admission at this time        FINAL CLINICAL IMPRESSION(S) / ED DIAGNOSES   Final diagnoses:  Shortness of breath  Hyperkalemia     Rx / DC Orders   ED Discharge Orders     None        Note:  This document was prepared using Dragon voice recognition software and may include unintentional dictation errors.   Jene Every, MD 12/24/22 1700

## 2022-12-24 NOTE — ED Triage Notes (Signed)
Pt here with sob x3 days and needing dialysis. Pt had a paracentesis done yesterday but states she was SOB since before then. Pt missed a couple days of her dialysis after going on vacation and was told that she needed to come to the ED to get her dialysis done. Pt denies NVD and pain. Pt normal dialysis days are T, Th, [facility].

## 2023-01-06 ENCOUNTER — Other Ambulatory Visit: Payer: Self-pay | Admitting: Internal Medicine

## 2023-01-06 DIAGNOSIS — R188 Other ascites: Secondary | ICD-10-CM

## 2023-01-07 ENCOUNTER — Ambulatory Visit
Admission: RE | Admit: 2023-01-07 | Discharge: 2023-01-07 | Disposition: A | Discharge status: Home / Self Care | Payer: 59 | Source: Ambulatory Visit | Attending: Internal Medicine | Admitting: Internal Medicine

## 2023-01-07 DIAGNOSIS — R188 Other ascites: Secondary | ICD-10-CM | POA: Insufficient documentation

## 2023-01-07 MED ORDER — LIDOCAINE HCL (PF) 1 % IJ SOLN
10.0000 mL | Freq: Once | INTRAMUSCULAR | Status: AC
  Administered 2023-01-07: 10 mL via INTRADERMAL
  Filled 2023-01-07: qty 10

## 2023-01-07 NOTE — Procedures (Signed)
PROCEDURE SUMMARY:  Successful US guided paracentesis from LLQ. Yielded 1.9 L of amber colored fluid.  No immediate complications.  Pt tolerated well.   Specimen was not sent for labs.  EBL < 5mL  Pattricia Boss D PA-C 01/07/2023 11:11 AM

## 2023-01-14 ENCOUNTER — Ambulatory Visit (INDEPENDENT_AMBULATORY_CARE_PROVIDER_SITE_OTHER): Payer: 59 | Admitting: Vascular Surgery

## 2023-01-14 ENCOUNTER — Encounter (INDEPENDENT_AMBULATORY_CARE_PROVIDER_SITE_OTHER): Payer: Self-pay | Admitting: Vascular Surgery

## 2023-01-14 VITALS — BP 127/86 | HR 86 | Resp 16 | Wt 108.0 lb

## 2023-01-14 DIAGNOSIS — J449 Chronic obstructive pulmonary disease, unspecified: Secondary | ICD-10-CM

## 2023-01-14 DIAGNOSIS — I2699 Other pulmonary embolism without acute cor pulmonale: Secondary | ICD-10-CM | POA: Diagnosis not present

## 2023-01-14 DIAGNOSIS — N186 End stage renal disease: Secondary | ICD-10-CM | POA: Diagnosis not present

## 2023-01-14 DIAGNOSIS — I420 Dilated cardiomyopathy: Secondary | ICD-10-CM | POA: Diagnosis not present

## 2023-01-14 DIAGNOSIS — Z992 Dependence on renal dialysis: Secondary | ICD-10-CM

## 2023-01-14 NOTE — Progress Notes (Signed)
MRN : [id number]  Rebecca Bridges is a 47 y.o. (April 09, 1976) female who presents with chief complaint of check access.  History of Present Illness:   The patient is seen for evaluation of dialysis access.     Procedure 03/31/2022: Fistulogram without intervention Based on the images, the radial artery is patent the palmar arch appears discontinuous with the radial artery.  The actual anastomosis appears patent but within 2 to 3 mm the vein becomes quite sclerotic and there is essentially greater than 90% stenosis over the next 2 to 3 cm.  At the level of a large dorsal branch of the cephalic vein becomes 6 to 7 mm in diameter and remains 6 to 7 mm all the way up to the antecubital fossa where both the basilic and the cephalic veins in the upper arm are partially matured.  This is not a situation that is amenable to intervention and will require revision.      Current access is via a catheter which is functioning adaquately.  There have not been multiple episodes of catheter infection.  The patient denies fever and chills while on dialysis.  No tenderness or drainage at the exit site.   No recent shortening of the patient's walking distance or new symptoms consistent with claudication.  No history of rest pain symptoms. No new ulcers or wounds of the lower extremities have occurred.   The patient denies amaurosis fugax or recent TIA symptoms. There are no recent neurological changes noted. There is no history of DVT, PE or superficial thrombophlebitis. No recent episodes of angina or shortness of breath documented.     Current Meds  Medication Sig   apixaban (ELIQUIS) 5 MG TABS tablet Take 5 mg by mouth 2 (two) times daily.   B Complex-C-Folic Acid (RENA-VITE RX) 1 MG TABS Take 1 tablet by mouth daily.   bictegravir-emtricitabine-tenofovir AF (BIKTARVY) 50-200-25 MG TABS tablet Take 1 tablet by mouth at bedtime.   calcium acetate (PHOSLO) 667 MG capsule Take 2,001 mg by  mouth 3 (three) times daily.   carvedilol (COREG) 12.5 MG tablet Take 12.5 mg by mouth 2 (two) times daily with a meal.   furosemide (LASIX) 80 MG tablet Take 80 mg by mouth 2 (two) times daily.   gabapentin (NEURONTIN) 300 MG capsule Take 300 mg by mouth at bedtime.   metoprolol tartrate (LOPRESSOR) 25 MG tablet Take 25 mg by mouth daily.   pantoprazole (PROTONIX) 40 MG tablet Take 40 mg by mouth daily.   prednisoLONE acetate (PRED FORTE) 1 % ophthalmic suspension Place 1 drop into the right eye at bedtime.   Sofosbuvir-Velpatasvir 400-100 MG TABS Take 1 tablet by mouth daily.   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   a.) occluded anterior tibial, tibioperoneal trunk, and posterior tibial arteries --> thromboembolectomy and PTA of the anterior tibial artery   CAD (coronary artery disease) 02/15/2019   a.) R/LHC 02/15/2019: mild (non-obstructive) diffuse LAD and LCx disease   COPD (chronic obstructive pulmonary disease) (HCC)    COVID    Dilated cardiomyopathy (HCC)    a.) TTE 11/27/2013: EF 20%; b.) TTE 03/15/2015: EF 20%; c.) TTE 12/09/2015: EF 45%; d.) TTE 12/22/2016: EF 35%; e.) TTE 08/08/2017: EF 45-50%; f.) TTE 01/27/2019: EF 20-25%;  g.) TTE 01/13/2021: EF <15%; h.) TTE 11/21/2021: EF <15%   ESRD (end stage renal disease) on dialysis [facility])    a.) T-Th-Sat   GERD (gastroesophageal reflux disease)    Hepatitis C virus    HFrEF (heart failure with reduced ejection fraction) (HCC)    a.) TTE 11/27/2013: EF 20%; b.) TTE 03/15/2015: EF 20%; c.) TTE 12/09/2015: EF 45%; d.) TTE 12/22/2016: EF 35%, G1DD; e.) TTE 08/08/2017: EF 45-50%, G1DD; f.) TTE 01/27/2019: EF 20-25%, G1DD; g.) R/LHC 02/15/2019: mPA 25, mPCWP 13, CO 4.84, CI 3.15; h.) TTE 01/13/2021: EF <15%, sev glob HK, sep/apical AK, mod asym LVH, sev LAE, mild RAE, G2DD; I.) TTE 11/21/2021: EF  <15%, G2DD   History of noncompliance with medical treatment    HIV (human immunodeficiency virus infection) (HCC) 2015   a.) on bictegravir-emtricitabine-tenofovir (Biktarvy)   Hypertension    Insomnia    a.) on Trazodone PRN   LBBB (left bundle branch block)    Liver disease, chronic, with cirrhosis (HCC)    Long term current use of anticoagulant    a.) apixaban   Pancytopenia (HCC)    Prolonged Q-T interval on ECG    Splenomegaly    Tobacco abuse     Past Surgical History:  Procedure Laterality Date   A/V FISTULAGRAM Left 03/31/2022   Procedure: A/V Fistulagram;  Surgeon: Renford Dills, MD;  Location: [facility] INVASIVE CV LAB;  Service: Cardiovascular;  Laterality: Left;   CHOLECYSTECTOMY     LOWER EXTREMITY ANGIOGRAPHY Left 01/25/2019   Procedure: Lower Extremity Angiography;  Surgeon: Renford Dills, MD;  Location: [facility] INVASIVE CV LAB;  Service: Cardiovascular;  Laterality: Left;   RIGHT/LEFT HEART CATH AND CORONARY ANGIOGRAPHY Bilateral 02/15/2019   Procedure: RIGHT/LEFT HEART CATH AND CORONARY ANGIOGRAPHY;  Surgeon: Antonieta Iba, MD;  Location: [facility] INVASIVE CV LAB;  Service: Cardiovascular;  Laterality: Bilateral;    Social History Social History   Tobacco Use   Smoking status: Every Day    Current packs/day: 0.25    Types: Cigarettes   Smokeless tobacco: Never   Tobacco comments:    5 cigarettes a day  Vaping Use   Vaping status: Never Used  Substance Use Topics   Alcohol use: No   Drug use: No    Family History Family History  Problem Relation Age of Onset   Hypertension Mother    Cerebral aneurysm Mother     Allergies  Allergen Reactions   Neosporin [Bacitracin-Polymyxin B] Rash   Sulfa Antibiotics Rash     REVIEW OF SYSTEMS (Negative unless checked)  Constitutional: [] Weight loss  [] Fever  [] Chills Cardiac: [] Chest pain   [] Chest pressure   [] Palpitations   [] Shortness of breath when laying flat   [] Shortness of breath with  exertion. Vascular:  [] Pain in legs with walking   [] Pain in legs at rest  [] History of DVT   [] Phlebitis   [] Swelling in legs   [] Varicose veins   [] Non-healing ulcers Pulmonary:   [] Uses home oxygen   [] Productive cough   [] Hemoptysis   [] Wheeze  [] COPD   [] Asthma Neurologic:  [] Dizziness   [] Seizures   [] History of stroke   [] History of TIA  [] Aphasia   [] Vissual changes   [] Weakness or numbness in arm   [] Weakness or numbness in leg Musculoskeletal:   [] Joint swelling   [] Joint pain   [] Low back pain Hematologic:  [] Easy bruising  [] Easy bleeding   [] Hypercoagulable state   [] Anemic Gastrointestinal:  [] Diarrhea   [] Vomiting  []   Gastroesophageal reflux/heartburn   [] Difficulty swallowing. Genitourinary:  [x] Chronic kidney disease   [] Difficult urination  [] Frequent urination   [] Blood in urine Skin:  [] Rashes   [] Ulcers  Psychological:  [] History of anxiety   []  History of major depression.  Physical Examination  Vitals:   01/14/23 1316  BP: 127/86  Pulse: 86  Resp: 16  Weight: 108 lb (49 kg)   Body mass index is 19.13 kg/m. Gen: WD/WN, NAD Head: [place]/AT, No temporalis wasting.  Ear/Nose/Throat: Hearing grossly intact, nares w/o erythema or drainage Eyes: PER, EOMI, sclera nonicteric.  Neck: Supple, no gross masses or lesions.  No JVD.  Pulmonary:  Good air movement, no audible wheezing, no use of accessory muscles.  Cardiac: RRR, precordium non-hyperdynamic. Vascular:   Left radiocephalic fistula with a very prominent vein palpable stenosis just above the anastomosis.  Catheter clean dry and intact Vessel Right Left  Radial Palpable Palpable  Brachial Palpable Palpable  Gastrointestinal: soft, non-distended. No guarding/no peritoneal signs.  Musculoskeletal: M/S 5/5 throughout.  No deformity.  Neurologic: CN 2-12 intact. Pain and light touch intact in extremities.  Symmetrical.  Speech is fluent. Motor exam as listed above. Psychiatric: Judgment intact, Mood & affect appropriate  for pt's clinical situation. Dermatologic: No rashes or ulcers noted.  No changes consistent with cellulitis.   CBC Lab Results  Component Value Date   WBC 2.5 (L) 12/24/2022   HGB 12.9 12/24/2022   HCT 41.6 12/24/2022   MCV 105.9 (H) 12/24/2022   PLT 60 (L) 12/24/2022    BMET    Component Value Date/Time   NA 134 (L) 12/24/2022 1332   NA 132 (L) 03/06/2019 1418   K 5.4 (H) 12/24/2022 1332   CL 102 12/24/2022 1332   CO2 19 (L) 12/24/2022 1332   GLUCOSE 87 12/24/2022 1332   BUN 76 (H) 12/24/2022 1332   BUN 41 (H) 03/06/2019 1418   CREATININE 11.64 (H) 12/24/2022 1332   CALCIUM 7.9 (L) 12/24/2022 1332   GFRNONAA 4 (L) 12/24/2022 1332   GFRAA 16 (L) 06/13/2019 1150   Estimated Creatinine Clearance: 4.7 mL/min (A) (by C-G formula based on SCr of 11.64 mg/dL (H)).  COAG Lab Results  Component Value Date   INR 1.5 (H) 10/04/2022   INR 1.6 (H) 03/10/2022   INR >10.0 (HH) 03/07/2022    Radiology US Paracentesis  Result Date: 01/07/2023 INDICATION: End-stage renal disease with recurrent ascites request received for therapeutic paracentesis. EXAM: ULTRASOUND GUIDED  PARACENTESIS MEDICATIONS: Local 1% lidocaine only. COMPLICATIONS: None immediate. PROCEDURE: Informed written consent was obtained from the patient after a discussion of the risks, benefits and alternatives to treatment. A timeout was performed prior to the initiation of the procedure. Initial ultrasound scanning demonstrates a small amount of ascites within the left lower abdominal quadrant. The left lower abdomen was prepped and draped in the usual sterile fashion. 1% lidocaine was used for local anesthesia. Following this, a 19 gauge, 7-cm, Yueh catheter was introduced. An ultrasound image was saved for documentation purposes. The paracentesis was performed. The catheter was removed and a dressing was applied. The patient tolerated the procedure well without immediate post procedural complication. FINDINGS: A total of  approximately 1.9 L of amber colored fluid was removed. IMPRESSION: Successful ultrasound-guided paracentesis yielding 1.9 liters of peritoneal fluid. This exam was performed by Pattricia Boss PA-C, and was supervised and interpreted by Dr. Loreta Ave. Electronically Signed   By: Gilmer Mor D.O.   On: 01/07/2023 11:35   DG Chest 2 View  Result Date: 12/24/2022 CLINICAL DATA:  sob EXAM: CHEST - 2 VIEW COMPARISON:  CXR 11/28/22 FINDINGS: Moderate left pleural effusion, unchanged. Cardiomegaly. Hazy opacity at the left lung base could represent atelectasis or infection. Right-sided central venous catheter with unchanged positioning. No new focal airspace opacity. No radiographically apparent displaced rib fractures. Surgical clips in the right upper quadrant. Vertebral body heights are maintained IMPRESSION: Moderate left pleural effusion, unchanged. Hazy opacity at the left lung base could represent atelectasis or infection. Electronically Signed   By: Lorenza Cambridge M.D.   On: 12/24/2022 14:59   US Paracentesis  Result Date: 12/23/2022 INDICATION: Patient with a history of end-stage renal disease with recurrent ascites. Interventional radiology asked to perform a therapeutic paracentesis. EXAM: ULTRASOUND GUIDED PARACENTESIS MEDICATIONS: 1% lidocaine 10 mL COMPLICATIONS: None immediate. PROCEDURE: Informed written consent was obtained from the patient after a discussion of the risks, benefits and alternatives to treatment. A timeout was performed prior to the initiation of the procedure. Initial ultrasound scanning demonstrates a large amount of ascites within the left lower abdominal quadrant. The left lower abdomen was prepped and draped in the usual sterile fashion. 1% lidocaine was used for local anesthesia. Following this, a 19 gauge, 7-cm, Yueh catheter was introduced. An ultrasound image was saved for documentation purposes. The paracentesis was performed. The catheter was removed and a dressing was  applied. The patient tolerated the procedure well without immediate post procedural complication. FINDINGS: A total of approximately 3.3 L of amber colored fluid was removed. IMPRESSION: Successful ultrasound-guided paracentesis yielding 3.3 liters of peritoneal fluid. Procedure performed by Alwyn Ren NP and was supervised by Dr. Juliette Alcide. Electronically Signed   By: Olive Bass M.D.   On: 12/23/2022 11:54     Assessment/Plan 1. ESRD on dialysis [facility]) Recommend:  At this time the patient does not have appropriate extremity access for dialysis  Patient should have a revision of the left wrist fistula.  Unfortunately, anesthesia at [facility] has refused this case.  I will reach out to [facility] to see if one of the vascular surgeons at that hospital is willing to perform the revision.  The risks, benefits and alternative therapies were reviewed in detail with the patient.  All questions were answered.  The patient agrees to proceed with surgery.   The patient will follow up with me in the office after the surgery.  2. Chronic obstructive pulmonary disease, unspecified COPD type (HCC) Continue pulmonary medications and aerosols as already ordered, these medications have been reviewed and there are no changes at this time.   3. Dilated cardiomyopathy (HCC) Continue cardiac and antihypertensive medications as already ordered and reviewed, no changes at this time.  Continue statin as ordered and reviewed, no changes at this time  Nitrates PRN for chest pain  4. Pulmonary embolism, unspecified chronicity, unspecified pulmonary embolism type, unspecified whether acute cor pulmonale present (HCC) Continue Eliquis therapy as ordered.    Levora Dredge, MD  01/14/2023 1:28 PM

## 2023-01-17 ENCOUNTER — Encounter (INDEPENDENT_AMBULATORY_CARE_PROVIDER_SITE_OTHER): Payer: Self-pay | Admitting: Vascular Surgery

## 2023-01-18 ENCOUNTER — Ambulatory Visit
Admission: RE | Admit: 2023-01-18 | Discharge: 2023-01-18 | Disposition: A | Discharge status: Home / Self Care | Payer: 59 | Source: Ambulatory Visit | Attending: Internal Medicine | Admitting: Internal Medicine

## 2023-01-18 DIAGNOSIS — R188 Other ascites: Secondary | ICD-10-CM | POA: Diagnosis present

## 2023-01-18 MED ORDER — LIDOCAINE HCL (PF) 1 % IJ SOLN
10.0000 mL | Freq: Once | INTRAMUSCULAR | Status: AC
  Administered 2023-01-18: 10 mL via SUBCUTANEOUS
  Filled 2023-01-18: qty 10

## 2023-01-26 ENCOUNTER — Other Ambulatory Visit: Payer: Self-pay | Admitting: Internal Medicine

## 2023-01-26 DIAGNOSIS — R188 Other ascites: Secondary | ICD-10-CM

## 2023-01-27 ENCOUNTER — Ambulatory Visit
Admission: RE | Admit: 2023-01-27 | Discharge: 2023-01-27 | Disposition: A | Discharge status: Home / Self Care | Payer: 59 | Source: Ambulatory Visit | Attending: Internal Medicine | Admitting: Internal Medicine

## 2023-01-27 DIAGNOSIS — R188 Other ascites: Secondary | ICD-10-CM | POA: Diagnosis not present

## 2023-01-27 MED ORDER — LIDOCAINE HCL (PF) 1 % IJ SOLN
10.0000 mL | Freq: Once | INTRAMUSCULAR | Status: AC
  Administered 2023-01-27: 10 mL via SUBCUTANEOUS
  Filled 2023-01-27: qty 10

## 2023-01-27 NOTE — Procedures (Signed)
PROCEDURE SUMMARY:  Successful US guided paracentesis from the left abdomen.  Yielded 2.6 L of clear yellow fluid.  No immediate complications.  Pt tolerated well.   Specimen not sent for labs.  EBL < 2 mL  Mickie Kay, NP 01/27/2023 11:33 AM

## 2023-02-04 ENCOUNTER — Other Ambulatory Visit: Payer: Self-pay | Admitting: Internal Medicine

## 2023-02-04 DIAGNOSIS — R188 Other ascites: Secondary | ICD-10-CM

## 2023-02-05 ENCOUNTER — Other Ambulatory Visit: Payer: Self-pay

## 2023-02-05 DIAGNOSIS — N186 End stage renal disease: Secondary | ICD-10-CM

## 2023-02-08 ENCOUNTER — Ambulatory Visit
Admission: RE | Admit: 2023-02-08 | Discharge: 2023-02-08 | Disposition: A | Discharge status: Home / Self Care | Payer: 59 | Source: Ambulatory Visit | Attending: Internal Medicine | Admitting: Internal Medicine

## 2023-02-08 DIAGNOSIS — R188 Other ascites: Secondary | ICD-10-CM | POA: Insufficient documentation

## 2023-02-08 MED ORDER — LIDOCAINE HCL (PF) 1 % IJ SOLN
10.0000 mL | Freq: Once | INTRAMUSCULAR | Status: AC
  Administered 2023-02-08: 10 mL via INTRADERMAL
  Filled 2023-02-08: qty 10

## 2023-02-08 NOTE — Procedures (Signed)
PROCEDURE SUMMARY:  Successful image-guided paracentesis from the left lower abdomen.  Yielded 2.9 liters of serosanguineous ascitic fluid.  No immediate complications.  EBL = trace. Patient tolerated well.   Specimen was not sent for labs.  Please see imaging section of Epic for full dictation.   Kennieth Francois PA-C 02/08/2023 8:56 AM

## 2023-02-11 ENCOUNTER — Encounter (HOSPITAL_COMMUNITY): Payer: Self-pay | Admitting: Surgery

## 2023-02-11 ENCOUNTER — Other Ambulatory Visit: Payer: Self-pay

## 2023-02-11 NOTE — Progress Notes (Signed)
Anesthesia Chart Review: Same-day workup  47 year old female with history including ESRD on HD due T Th Sa, nonischemic cardiomyopathy with EF 15%, HIV maintained on Biktarvy, hepatitis C (diagnosed 2015, previously treated with incomplete course of Mavyret, now being treated with Epclusa), decompensated cirrhosis, GERD maintained on Protonix, history of VTE on Eliquis.  Seen by cardiologist Dr. Gerre Pebbles at [facility] on 12/09/2021 for initial consult.  Per note, "The patient has nonischemic cardiomyopathy with the most recent ejection fraction measured at <15%. The patient's NYHA Class is III. She has been on optimal medical management for many months limited by her CKD/esrd. Given this, the patient is at an increased risk of sudden cardiac death may benefit from ICD implantation. Additionally, given wide QRS and LBBB, she will likely benefit from biventricular pacing. I reviewed the potential benefits and risks of the procedure. She is high risk given her co-morbidities and access limitations. She has an AVF on the left and perm cath on the right. Specifically, I informed her that the potential risks include but are not limited to infection, bleeding, pneumothorax, and cardiac perforation with or without tamponade. She verbalized understanding, and she signed the consent form in my presence in clinic today. Im still not convinced that device implantation is the best option as a device infection would be a signifcant setback given all of her other medical issues. I will present her case at an upcoming morning report. And further discuss this with the patient. "  Patient states she was told she needs permanent dialysis access with removal of PermCath before considering CRT-D placement.  Patient has standing order for weekly paracentesis secondary to decompensated cirrhosis.  Most recent paracentesis 02/08/2023 yielded 2.9 L.  Patient reports last Eliquis 02/09/2023.  Reviewed case with anesthesiologist Dr.  Ace Gins.  Advised okay to proceed as planned barring acute status change.  Patient will need to have surgery labs and evaluation.  EKG 12/24/2022: Sinus rhythm with occasional Premature ventricular complexes. Non-specific intra-ventricular conduction block. Minimal voltage criteria for LVH, may be normal variant ( Cornell product ). Possible Lateral infarct , age undetermined  TTE 11/21/2021 (Care Everywhere): INTERPRETATION  SEVERE LV SYSTOLIC DYSFUNCTION (closest EF: <15% (estimated))   WITH MILD LVH  MILD RV SYSTOLIC DYSFUNCTION (See above)  MODERATE VALVULAR REGURGITATION (See above)  NO VALVULAR STENOSIS  MODERATE TO SEVERE TR AND MR  COMPARED TO PREVIOUS EXAM NO SIGNIFICNT CHANGE   Nuclear stress 03/05/2021 (Care Everywhere): FINAL COMMENTS   1. Myocardial perfusion images demonstrate no evidence of inducible myocardial ischemia. Fixed perfusion abnormalities can be   related to coronary artery disease with prior myocardial infarction vs non-ischemic cardiomyopathy related myocardial   remodelling with myocardial injury. Futher evaluation with cardiac stress PET to determine coronary flow reserve can be   pursued for further evaluation.   2. Dilated LV with severe left ventricular systolic dysfunction.   Right/Left heart cath 02/15/2019: Final Conclusions:  nonobstructive CAD Normal pulmonary pressures  Recommendations:  Will transition to warfarin, off eliquis for possible LV thrombus, recent hx of embolism, ischemic foot Will discuss with medical management as she does not have pharmacy insurance, 4 doses of Lovenox cost her >80$ Continue other medications     Zannie Cove [facility] Short Stay Center/Anesthesiology Phone [phone] 02/11/2023 4:33 PM

## 2023-02-11 NOTE — Anesthesia Preprocedure Evaluation (Addendum)
Anesthesia Evaluation  Patient identified by MRN, date of birth, ID band Patient awake    Reviewed: Allergy & Precautions, NPO status , Patient's Chart, lab work & pertinent test results  History of Anesthesia Complications Negative for: history of anesthetic complications  Airway Mallampati: II  TM Distance: >3 FB Neck ROM: Full    Dental  (+) Dental Advisory Given   Pulmonary COPD, Current Smoker and Patient abstained from smoking.   Pulmonary exam normal        Cardiovascular hypertension, Pt. on home beta blockers and Pt. on medications + CAD, + Peripheral Vascular Disease and +CHF  Normal cardiovascular exam+ dysrhythmias + Valvular Problems/Murmurs MR    TTE 11/21/2021 (Care Everywhere): EF <15% estimated, mild LVD. Mild RV dysfunction. Moderate to severe TR and MR.    Neuro/Psych  Headaches  negative psych ROS   GI/Hepatic ,GERD  Medicated,,(+) Cirrhosis   ascites    , Hepatitis -, C  Endo/Other   K 5.5   Renal/GU ESRFRenal disease     Musculoskeletal negative musculoskeletal ROS (+)    Abdominal   Peds  Hematology  (+) Blood dyscrasia, anemia , HIV  Anesthesia Other Findings   Reproductive/Obstetrics                             Anesthesia Physical Anesthesia Plan  ASA: 4  Anesthesia Plan: MAC   Post-op Pain Management:    Induction:   PONV Risk Score and Plan: 2 and Propofol infusion and Treatment may vary due to age or medical condition  Airway Management Planned: Natural Airway and Simple Face Mask  Additional Equipment: None  Intra-op Plan:   Post-operative Plan:   Informed Consent: I have reviewed the patients History and Physical, chart, labs and discussed the procedure including the risks, benefits and alternatives for the proposed anesthesia with the patient or authorized representative who has indicated his/her understanding and acceptance.       Plan  Discussed with: CRNA, Anesthesiologist and Surgeon  Anesthesia Plan Comments: (See PAT note )        Anesthesia Quick Evaluation

## 2023-02-11 NOTE — Progress Notes (Signed)
SDW call  Patient was given pre-op instructions over the phone. Patient verbalized understanding of instructions provided.     PCP - [facility] in [place], [place] Cardiologist - Dr. Darin Engels at [facility] Pulmonary: denies   PPM/ICD - denies Device Orders - n/a Rep Notified - n/a   Chest x-ray - 12/24/2022 EKG -  12/25/2022 Stress Test - ECHO - 01/27/2019 Cardiac Cath - 02/15/2019  Sleep Study/sleep apnea/CPAP: denies  Non diabetic  Blood Thinner Instructions: Eliquis, states last dose was 02/09/2023 Aspirin Instructions:denies   ERAS Protcol - No, NPO  COVID TEST- n/a    Anesthesia review: Yes. HTN, CAD, Hep C, HIV, ESRD with dialysis T, Th, Sat, LBBB, cardiomyopathy with EF of 20%   Patient denies shortness of breath, fever, cough and chest pain over the phone call  Your procedure is scheduled on Friday February 12, 2023   Report to [facility] Main Entrance "A" at 0910  A.M., then check in with the Admitting office.  Call this number if you have problems the morning of surgery:  [phone]   If you have any questions prior to your surgery date call [phone]: Open Monday-Friday 8am-4pm If you experience any cold or flu symptoms such as cough, fever, chills, shortness of breath, etc. between now and your scheduled surgery, please notify us at the above number     Remember:  Do not eat or drink after midnight the night before your surgery   Take these medicines the morning of surgery with A SIP OF WATER:  Biktarvy, dapsone, metoprolol, protonix  As needed: Eye drops  As of today, STOP taking any Aspirin (unless otherwise instructed by your surgeon) Aleve, Naproxen, Ibuprofen, Motrin, Advil, Goody's, BC's, all herbal medications, fish oil, and all vitamins.

## 2023-02-12 ENCOUNTER — Ambulatory Visit (HOSPITAL_COMMUNITY)
Admission: RE | Admit: 2023-02-12 | Discharge: 2023-02-12 | Disposition: A | Discharge status: Home / Self Care | Payer: 59 | Attending: Surgery | Admitting: Surgery

## 2023-02-12 ENCOUNTER — Encounter (HOSPITAL_COMMUNITY)
Admission: RE | Disposition: A | Discharge status: Home / Self Care | Payer: Self-pay | Source: Home / Self Care | Attending: Surgery

## 2023-02-12 ENCOUNTER — Ambulatory Visit (HOSPITAL_BASED_OUTPATIENT_CLINIC_OR_DEPARTMENT_OTHER): Payer: 59 | Admitting: Physician Assistant

## 2023-02-12 ENCOUNTER — Ambulatory Visit (HOSPITAL_COMMUNITY): Payer: 59 | Admitting: Physician Assistant

## 2023-02-12 ENCOUNTER — Other Ambulatory Visit: Payer: Self-pay

## 2023-02-12 ENCOUNTER — Encounter (HOSPITAL_COMMUNITY): Payer: Self-pay | Admitting: Surgery

## 2023-02-12 DIAGNOSIS — Z7901 Long term (current) use of anticoagulants: Secondary | ICD-10-CM | POA: Diagnosis not present

## 2023-02-12 DIAGNOSIS — J449 Chronic obstructive pulmonary disease, unspecified: Secondary | ICD-10-CM | POA: Insufficient documentation

## 2023-02-12 DIAGNOSIS — I2510 Atherosclerotic heart disease of native coronary artery without angina pectoris: Secondary | ICD-10-CM | POA: Diagnosis not present

## 2023-02-12 DIAGNOSIS — I340 Nonrheumatic mitral (valve) insufficiency: Secondary | ICD-10-CM | POA: Insufficient documentation

## 2023-02-12 DIAGNOSIS — Z006 Encounter for examination for normal comparison and control in clinical research program: Secondary | ICD-10-CM | POA: Diagnosis not present

## 2023-02-12 DIAGNOSIS — Z21 Asymptomatic human immunodeficiency virus [HIV] infection status: Secondary | ICD-10-CM | POA: Diagnosis not present

## 2023-02-12 DIAGNOSIS — I739 Peripheral vascular disease, unspecified: Secondary | ICD-10-CM | POA: Diagnosis not present

## 2023-02-12 DIAGNOSIS — T82898A Other specified complication of vascular prosthetic devices, implants and grafts, initial encounter: Secondary | ICD-10-CM | POA: Diagnosis not present

## 2023-02-12 DIAGNOSIS — I5022 Chronic systolic (congestive) heart failure: Secondary | ICD-10-CM | POA: Diagnosis not present

## 2023-02-12 DIAGNOSIS — X58XXXA Exposure to other specified factors, initial encounter: Secondary | ICD-10-CM | POA: Diagnosis not present

## 2023-02-12 DIAGNOSIS — I132 Hypertensive heart and chronic kidney disease with heart failure and with stage 5 chronic kidney disease, or end stage renal disease: Secondary | ICD-10-CM

## 2023-02-12 DIAGNOSIS — T82858A Stenosis of vascular prosthetic devices, implants and grafts, initial encounter: Secondary | ICD-10-CM | POA: Insufficient documentation

## 2023-02-12 DIAGNOSIS — N186 End stage renal disease: Secondary | ICD-10-CM | POA: Insufficient documentation

## 2023-02-12 DIAGNOSIS — K219 Gastro-esophageal reflux disease without esophagitis: Secondary | ICD-10-CM | POA: Insufficient documentation

## 2023-02-12 DIAGNOSIS — I429 Cardiomyopathy, unspecified: Secondary | ICD-10-CM | POA: Diagnosis not present

## 2023-02-12 DIAGNOSIS — F17210 Nicotine dependence, cigarettes, uncomplicated: Secondary | ICD-10-CM | POA: Diagnosis not present

## 2023-02-12 DIAGNOSIS — K7460 Unspecified cirrhosis of liver: Secondary | ICD-10-CM | POA: Insufficient documentation

## 2023-02-12 DIAGNOSIS — Z86718 Personal history of other venous thrombosis and embolism: Secondary | ICD-10-CM | POA: Diagnosis not present

## 2023-02-12 DIAGNOSIS — I428 Other cardiomyopathies: Secondary | ICD-10-CM | POA: Insufficient documentation

## 2023-02-12 DIAGNOSIS — Z992 Dependence on renal dialysis: Secondary | ICD-10-CM

## 2023-02-12 DIAGNOSIS — B20 Human immunodeficiency virus [HIV] disease: Secondary | ICD-10-CM

## 2023-02-12 DIAGNOSIS — Z79899 Other long term (current) drug therapy: Secondary | ICD-10-CM | POA: Insufficient documentation

## 2023-02-12 DIAGNOSIS — B1920 Unspecified viral hepatitis C without hepatic coma: Secondary | ICD-10-CM | POA: Diagnosis not present

## 2023-02-12 HISTORY — PX: AV FISTULA PLACEMENT: SHX1204

## 2023-02-12 LAB — POCT I-STAT, CHEM 8
BUN: 37 mg/dL — ABNORMAL HIGH (ref 6–20)
Calcium, Ion: 0.88 mmol/L — CL (ref 1.15–1.40)
Chloride: 104 mmol/L (ref 98–111)
Creatinine, Ser: 7.8 mg/dL — ABNORMAL HIGH (ref 0.44–1.00)
Glucose, Bld: 77 mg/dL (ref 70–99)
HCT: 32 % — ABNORMAL LOW (ref 36.0–46.0)
Hemoglobin: 10.9 g/dL — ABNORMAL LOW (ref 12.0–15.0)
Potassium: 5.5 mmol/L — ABNORMAL HIGH (ref 3.5–5.1)
Sodium: 140 mmol/L (ref 135–145)
TCO2: 24 mmol/L (ref 22–32)

## 2023-02-12 SURGERY — ARTERIOVENOUS (AV) FISTULA CREATION
Anesthesia: Monitor Anesthesia Care | Laterality: Left

## 2023-02-12 MED ORDER — LIDOCAINE-EPINEPHRINE (PF) 1 %-1:200000 IJ SOLN
INTRAMUSCULAR | Status: DC | PRN
  Administered 2023-02-12: 20 mL

## 2023-02-12 MED ORDER — ONDANSETRON HCL 4 MG/2ML IJ SOLN
INTRAMUSCULAR | Status: DC | PRN
  Administered 2023-02-12: 4 mg via INTRAVENOUS

## 2023-02-12 MED ORDER — CHLORHEXIDINE GLUCONATE 4 % EX SOLN: 60.0000 mL | Freq: Once | CUTANEOUS | Status: DC

## 2023-02-12 MED ORDER — KETAMINE HCL 50 MG/5ML IJ SOSY
PREFILLED_SYRINGE | INTRAMUSCULAR | Status: AC
  Filled 2023-02-12: qty 5

## 2023-02-12 MED ORDER — MIDAZOLAM HCL 2 MG/2ML IJ SOLN
INTRAMUSCULAR | Status: AC
  Filled 2023-02-12: qty 2

## 2023-02-12 MED ORDER — FENTANYL CITRATE (PF) 100 MCG/2ML IJ SOLN
INTRAMUSCULAR | Status: AC
  Filled 2023-02-12: qty 2

## 2023-02-12 MED ORDER — PROPOFOL 500 MG/50ML IV EMUL
INTRAVENOUS | Status: DC | PRN
  Administered 2023-02-12: 30 ug/kg/min via INTRAVENOUS

## 2023-02-12 MED ORDER — LIDOCAINE 2% (20 MG/ML) 5 ML SYRINGE
INTRAMUSCULAR | Status: DC | PRN
  Administered 2023-02-12: 40 mg via INTRAVENOUS

## 2023-02-12 MED ORDER — PROPOFOL 10 MG/ML IV BOLUS
INTRAVENOUS | Status: AC
  Filled 2023-02-12: qty 20

## 2023-02-12 MED ORDER — 0.9 % SODIUM CHLORIDE (POUR BTL) OPTIME
TOPICAL | Status: DC | PRN
  Administered 2023-02-12: 1000 mL

## 2023-02-12 MED ORDER — HEMOSTATIC AGENTS (NO CHARGE) OPTIME
TOPICAL | Status: DC | PRN
Start: 2023-02-12 — End: 2023-02-12
  Administered 2023-02-12: 1 via TOPICAL

## 2023-02-12 MED ORDER — ONDANSETRON HCL 4 MG/2ML IJ SOLN
INTRAMUSCULAR | Status: AC
  Filled 2023-02-12: qty 2

## 2023-02-12 MED ORDER — EPHEDRINE 5 MG/ML INJ
INTRAVENOUS | Status: AC
  Filled 2023-02-12: qty 5

## 2023-02-12 MED ORDER — ORAL CARE MOUTH RINSE: 15.0000 mL | Freq: Once | OROMUCOSAL | Status: AC

## 2023-02-12 MED ORDER — CEFAZOLIN SODIUM-DEXTROSE 2-4 GM/100ML-% IV SOLN
INTRAVENOUS | Status: AC
  Filled 2023-02-12: qty 100

## 2023-02-12 MED ORDER — LIDOCAINE 2% (20 MG/ML) 5 ML SYRINGE
INTRAMUSCULAR | Status: AC
  Filled 2023-02-12: qty 5

## 2023-02-12 MED ORDER — SODIUM CHLORIDE 0.9 % IV SOLN: INTRAVENOUS | Status: DC

## 2023-02-12 MED ORDER — HEPARIN 6000 UNIT IRRIGATION SOLUTION
Status: AC
  Filled 2023-02-12: qty 500

## 2023-02-12 MED ORDER — FENTANYL CITRATE (PF) 250 MCG/5ML IJ SOLN
INTRAMUSCULAR | Status: DC | PRN
  Administered 2023-02-12: 75 ug via INTRAVENOUS

## 2023-02-12 MED ORDER — DEXAMETHASONE SODIUM PHOSPHATE 10 MG/ML IJ SOLN
INTRAMUSCULAR | Status: AC
  Filled 2023-02-12: qty 1

## 2023-02-12 MED ORDER — VASOPRESSIN 20 UNIT/ML IV SOLN
INTRAVENOUS | Status: AC
  Filled 2023-02-12: qty 1

## 2023-02-12 MED ORDER — CEFAZOLIN SODIUM-DEXTROSE 2-4 GM/100ML-% IV SOLN
2.0000 g | INTRAVENOUS | Status: AC
  Administered 2023-02-12: 2 g via INTRAVENOUS

## 2023-02-12 MED ORDER — FENTANYL CITRATE (PF) 250 MCG/5ML IJ SOLN
INTRAMUSCULAR | Status: AC
  Filled 2023-02-12: qty 5

## 2023-02-12 MED ORDER — KETAMINE HCL 10 MG/ML IJ SOLN
INTRAMUSCULAR | Status: DC | PRN
Start: 2023-02-12 — End: 2023-02-12
  Administered 2023-02-12 (×2): 15 mg via INTRAVENOUS

## 2023-02-12 MED ORDER — PHENYLEPHRINE 80 MCG/ML (10ML) SYRINGE FOR IV PUSH (FOR BLOOD PRESSURE SUPPORT)
PREFILLED_SYRINGE | INTRAVENOUS | Status: DC | PRN
  Administered 2023-02-12: 80 ug via INTRAVENOUS

## 2023-02-12 MED ORDER — ROCURONIUM BROMIDE 10 MG/ML (PF) SYRINGE
PREFILLED_SYRINGE | INTRAVENOUS | Status: AC
  Filled 2023-02-12: qty 10

## 2023-02-12 MED ORDER — LIDOCAINE-EPINEPHRINE 1 %-1:100000 IJ SOLN
INTRAMUSCULAR | Status: AC
  Filled 2023-02-12: qty 1

## 2023-02-12 MED ORDER — MIDAZOLAM HCL 2 MG/2ML IJ SOLN
INTRAMUSCULAR | Status: DC | PRN
  Administered 2023-02-12: 2 mg via INTRAVENOUS

## 2023-02-12 MED ORDER — HEPARIN 6000 UNIT IRRIGATION SOLUTION
Status: DC | PRN
  Administered 2023-02-12: 1

## 2023-02-12 MED ORDER — OXYCODONE-ACETAMINOPHEN 5-325 MG PO TABS: 1.0000 | ORAL_TABLET | ORAL | 0 refills | Status: DC | PRN

## 2023-02-12 MED ORDER — CHLORHEXIDINE GLUCONATE 0.12 % MT SOLN: 15.0000 mL | Freq: Once | OROMUCOSAL | Status: AC

## 2023-02-12 MED ORDER — PHENYLEPHRINE 80 MCG/ML (10ML) SYRINGE FOR IV PUSH (FOR BLOOD PRESSURE SUPPORT)
PREFILLED_SYRINGE | INTRAVENOUS | Status: AC
  Filled 2023-02-12: qty 10

## 2023-02-12 MED ORDER — CHLORHEXIDINE GLUCONATE 0.12 % MT SOLN
OROMUCOSAL | Status: AC
  Administered 2023-02-12: 15 mL via OROMUCOSAL
  Filled 2023-02-12: qty 15

## 2023-02-12 SURGICAL SUPPLY — 34 items
ADH SKN CLS APL DERMABOND .7 (GAUZE/BANDAGES/DRESSINGS) ×1
ARMBAND PINK RESTRICT EXTREMIT (MISCELLANEOUS) ×2 IMPLANT
BAG COUNTER SPONGE SURGICOUNT (BAG) ×1 IMPLANT
BAG SPNG CNTER NS LX DISP (BAG) ×1
CANISTER SUCT 3000ML PPV (MISCELLANEOUS) ×1 IMPLANT
CLIP TI MEDIUM 6 (CLIP) ×1 IMPLANT
CLIP TI WIDE RED SMALL 6 (CLIP) ×1 IMPLANT
COVER PROBE W GEL 5X96 (DRAPES) ×1 IMPLANT
DERMABOND ADVANCED .7 DNX12 (GAUZE/BANDAGES/DRESSINGS) ×1 IMPLANT
ELECT REM PT RETURN 9FT ADLT (ELECTROSURGICAL) ×1
ELECTRODE REM PT RTRN 9FT ADLT (ELECTROSURGICAL) ×1 IMPLANT
GAUZE SPONGE 4X4 12PLY STRL (GAUZE/BANDAGES/DRESSINGS) IMPLANT
GLOVE PI ORTHO PRO STRL 7.5 (GLOVE) IMPLANT
GLOVE SURG SS PI 7.5 STRL IVOR (GLOVE) ×3 IMPLANT
GOWN STRL REUS W/ TWL LRG LVL3 (GOWN DISPOSABLE) ×2 IMPLANT
GOWN STRL REUS W/ TWL XL LVL3 (GOWN DISPOSABLE) ×1 IMPLANT
GOWN STRL REUS W/TWL LRG LVL3 (GOWN DISPOSABLE) ×2
GOWN STRL REUS W/TWL XL LVL3 (GOWN DISPOSABLE) ×2
HEMOSTAT SNOW SURGICEL 2X4 (HEMOSTASIS) IMPLANT
KIT BASIN OR (CUSTOM PROCEDURE TRAY) ×1 IMPLANT
KIT TURNOVER KIT B (KITS) ×1 IMPLANT
NS IRRIG 1000ML POUR BTL (IV SOLUTION) ×1 IMPLANT
PACK CV ACCESS (CUSTOM PROCEDURE TRAY) ×1 IMPLANT
PAD ARMBOARD 7.5X6 YLW CONV (MISCELLANEOUS) ×2 IMPLANT
SLING ARM FOAM STRAP LRG (SOFTGOODS) IMPLANT
SLING ARM FOAM STRAP MED (SOFTGOODS) IMPLANT
SUT MNCRL AB 4-0 PS2 18 (SUTURE) IMPLANT
SUT PROLENE 6 0 CC (SUTURE) ×1 IMPLANT
SUT VIC AB 3-0 SH 27 (SUTURE) ×1
SUT VIC AB 3-0 SH 27X BRD (SUTURE) ×1 IMPLANT
SUT VICRYL 4-0 PS2 18IN ABS (SUTURE) ×1 IMPLANT
TOWEL GREEN STERILE (TOWEL DISPOSABLE) ×1 IMPLANT
UNDERPAD 30X36 HEAVY ABSORB (UNDERPADS AND DIAPERS) ×1 IMPLANT
WATER STERILE IRR 1000ML POUR (IV SOLUTION) ×1 IMPLANT

## 2023-02-12 NOTE — H&P (Signed)
Vascular and Vein Specialist of [facility]  Patient name: Rebecca Bridges MRN: [id number] DOB: 1975/07/22 Sex: female   REQUESTING PROVIDER:    Dr. Gilda Crease   REASON FOR CONSULT:    Fistula issues   HISTORY OF PRESENT ILLNESS:   Rebecca Bridges is a 47 y.o. female, who I have been asked to evaluate for fistula revision.  The patient underwent a left radiocephalic fistula in [place].  The vein in the mid forearm and proximally has developed very nicely however at fistulogram, the proximal few millimeters of the vein beyond the arterial anastomosis was sclerotic.  The patient's cardiac history precluded her from having this revised at [facility] and so request has been made to have this done at [facility].  The patient suffers from coronary artery disease.  She has dilated cardiomyopathy.  She is on dialysis Tuesday Thursday through a catheter.  She is medically managed for hypertension.  She is HIV positive.  She has chronic liver disease with cirrhosis.  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   a.) occluded anterior tibial, tibioperoneal trunk, and posterior tibial arteries --> thromboembolectomy and PTA of the anterior tibial artery   CAD (coronary artery disease) 02/15/2019   a.) R/LHC 02/15/2019: mild (non-obstructive) diffuse LAD and LCx disease   COPD (chronic obstructive pulmonary disease) (HCC)    COVID    Dilated cardiomyopathy (HCC)    a.) TTE 11/27/2013: EF 20%; b.) TTE 03/15/2015: EF 20%; c.) TTE 12/09/2015: EF 45%; d.) TTE 12/22/2016: EF 35%; e.) TTE 08/08/2017: EF 45-50%; f.) TTE 01/27/2019: EF 20-25%; g.) TTE 01/13/2021: EF <15%; h.) TTE 11/21/2021: EF <15%   ESRD (end stage renal disease) on dialysis (HCC)    a.) T-Th-Sat   GERD (gastroesophageal reflux disease)    Hepatitis C virus    HFrEF (heart failure with reduced ejection fraction) (HCC)    a.) TTE  11/27/2013: EF 20%; b.) TTE 03/15/2015: EF 20%; c.) TTE 12/09/2015: EF 45%; d.) TTE 12/22/2016: EF 35%, G1DD; e.) TTE 08/08/2017: EF 45-50%, G1DD; f.) TTE 01/27/2019: EF 20-25%, G1DD; g.) R/LHC 02/15/2019: mPA 25, mPCWP 13, CO 4.84, CI 3.15; h.) TTE 01/13/2021: EF <15%, sev glob HK, sep/apical AK, mod asym LVH, sev LAE, mild RAE, G2DD; I.) TTE 11/21/2021: EF <15%, G2DD   History of noncompliance with medical treatment    HIV (human immunodeficiency virus infection) (HCC) 2015   a.) on bictegravir-emtricitabine-tenofovir (Biktarvy)   Hypertension    Insomnia    a.) on Trazodone PRN   LBBB (left bundle branch block)    Liver disease, chronic, with cirrhosis (HCC)    Long term current use of anticoagulant    a.) apixaban   Pancytopenia (HCC)    Prolonged Q-T interval on ECG    Splenomegaly    Tobacco abuse      FAMILY HISTORY   Family History  Problem Relation Age of Onset   Hypertension Mother    Cerebral aneurysm Mother     SOCIAL HISTORY:   Social History   Socioeconomic History   Marital status: Single    Spouse name: Not on file   Number of children: Not on file   Years of education: Not on file   Highest education level: Not on file  Occupational History   Not on file  Tobacco Use   Smoking status: Every Day    Current packs/day: 0.25    Types: Cigarettes   Smokeless  tobacco: Never   Tobacco comments:    5 cigarettes a day  Vaping Use   Vaping status: Never Used  Substance and Sexual Activity   Alcohol use: No   Drug use: No   Sexual activity: Not Currently  Other Topics Concern   Not on file  Social History Narrative   Not on file   Social Determinants of Health   Financial Resource Strain: Low Risk  (12/29/2022)   Received from [facility]   Overall Financial Resource Strain (CARDIA)    Difficulty of Paying Living Expenses: Not hard at all  Food Insecurity: No Food Insecurity (12/29/2022)   Received from [facility]    Hunger Vital Sign    Worried About Running Out of Food in the Last Year: Never true    Ran Out of Food in the Last Year: Never true  Transportation Needs: No Transportation Needs (12/29/2022)   Received from [facility] - Transportation    In the past 12 months, has lack of transportation kept you from medical appointments or from getting medications?: No    Lack of Transportation (Non-Medical): No  Physical Activity: Inactive (04/26/2019)   Exercise Vital Sign    Days of Exercise per Week: 0 days    Minutes of Exercise per Session: 0 min  Stress: No Stress Concern Present (04/26/2019)   [employer] of Occupational Health - Occupational Stress Questionnaire    Feeling of Stress : Not at all  Social Connections: Moderately Isolated (04/26/2019)   Social Connection and Isolation Panel [NHANES]    Frequency of Communication with Friends and Family: More than three times a week    Frequency of Social Gatherings with Friends and Family: More than three times a week    Attends Religious Services: 1 to 4 times per year    Active Member of [employer] or Organizations: No    Attends Banker Meetings: Never    Marital Status: Never married  Intimate Partner Violence: Not At Risk (10/05/2022)   Humiliation, Afraid, Rape, and Kick questionnaire    Fear of Current or Ex-Partner: No    Emotionally Abused: No    Physically Abused: No    Sexually Abused: No    ALLERGIES:    Allergies  Allergen Reactions   Neosporin [Bacitracin-Polymyxin B] Rash   Sulfa Antibiotics Rash    CURRENT MEDICATIONS:    Current Facility-Administered Medications  Medication Dose Route Frequency Provider Last Rate Last Admin   fentaNYL (SUBLIMAZE) 100 MCG/2ML injection            midazolam (VERSED) 2 MG/2ML injection            0.9 %  sodium chloride infusion   Intravenous Continuous Nada Libman, MD       0.9 %  sodium chloride infusion   Intravenous Continuous  [place] Nation, MD 10 mL/hr at 02/12/23 0948 New Bag at 02/12/23 0948   ceFAZolin (ANCEF) 2-4 GM/100ML-% IVPB            ceFAZolin (ANCEF) IVPB 2g/100 mL premix  2 g Intravenous 30 min Pre-Op Nada Libman, MD       chlorhexidine (HIBICLENS) 4 % liquid 4 Application  60 mL Topical Once Nada Libman, MD       And   [START ON 02/13/2023] chlorhexidine (HIBICLENS) 4 % liquid 4 Application  60 mL Topical Once Nada Libman, MD  REVIEW OF SYSTEMS:   [X]  denotes positive finding, [ ]  denotes negative finding Cardiac  Comments:  Chest pain or chest pressure:    Shortness of breath upon exertion:    Short of breath when lying flat:    Irregular heart rhythm:        Vascular    Pain in calf, thigh, or hip brought on by ambulation:    Pain in feet at night that wakes you up from your sleep:     Blood clot in your veins:    Leg swelling:         Pulmonary    Oxygen at home:    Productive cough:     Wheezing:         Neurologic    Sudden weakness in arms or legs:     Sudden numbness in arms or legs:     Sudden onset of difficulty speaking or slurred speech:    Temporary loss of vision in one eye:     Problems with dizziness:         Gastrointestinal    Blood in stool:      Vomited blood:         Genitourinary    Burning when urinating:     Blood in urine:        Psychiatric    Major depression:         Hematologic    Bleeding problems:    Problems with blood clotting too easily:        Skin    Rashes or ulcers:        Constitutional    Fever or chills:     PHYSICAL EXAM:   Vitals:   02/12/23 0925  BP: 105/77  Pulse: 84  Resp: 16  Temp: 98 F (36.7 C)  TempSrc: Oral  SpO2: 100%  Weight: 54.4 kg  Height: 5\' 3"  (1.6 m)    GENERAL: The patient is a well-nourished female, in no acute distress. The vital signs are documented above. CARDIAC: There is a regular rate and rhythm.  VASCULAR: Palpable thrill within left radiocephalic fistula PULMONARY:  Nonlabored respirations ABDOMEN: Soft and non-tender with normal pitched bowel sounds.  MUSCULOSKELETAL: There are no major deformities or cyanosis. NEUROLOGIC: No focal weakness or paresthesias are detected. SKIN: There are no ulcers or rashes noted. PSYCHIATRIC: The patient has a normal affect.  STUDIES:   I have reviewed her fistulogram which shows a sclerotic first portion of the vein but then the vein dilates nicely.  ASSESSMENT and PLAN   End-stage renal disease: I discussed proceeding with revision of her left arm fistula.  Most likely this will be by making the arterial anastomosis a little more proximal in the radial artery.  Alternatively, we discussed a brachiocephalic fistula.  We have agreed to proceed with radiocephalic revision.  All questions were answered.   Charlena Cross, MD, FACS [facility] [phone] Pager [phone]

## 2023-02-12 NOTE — Transfer of Care (Signed)
Immediate Anesthesia Transfer of Care Note  Patient: Rebecca Bridges  Procedure(s) Performed: LEFT ARM FISTULA CREATION (Left)  Patient Location: PACU  Anesthesia Type:General  Level of Consciousness: awake and alert   Airway & Oxygen Therapy: Patient Spontanous Breathing and Patient connected to nasal cannula oxygen  Post-op Assessment: Report given to RN and Post -op Vital signs reviewed and stable  Post vital signs: Reviewed and stable  Last Vitals:  Vitals Value Taken Time  BP 130/65 02/12/23 1215  Temp    Pulse 70 02/12/23 1219  Resp 17 02/12/23 1219  SpO2 100 % 02/12/23 1219  Vitals shown include unfiled device data.  Last Pain:  Vitals:   02/12/23 0932  TempSrc:   PainSc: 0-No pain         Complications: There were no known notable events for this encounter.

## 2023-02-12 NOTE — Anesthesia Postprocedure Evaluation (Signed)
Anesthesia Post Note  Patient: Dispensing optician  Procedure(s) Performed: LEFT ARM FISTULA CREATION (Left)     Patient location during evaluation: PACU Anesthesia Type: MAC Level of consciousness: awake and alert Pain management: pain level controlled Vital Signs Assessment: post-procedure vital signs reviewed and stable Respiratory status: spontaneous breathing, nonlabored ventilation and respiratory function stable Cardiovascular status: stable and blood pressure returned to baseline Anesthetic complications: no   There were no known notable events for this encounter.  Last Vitals:  Vitals:   02/12/23 1215 02/12/23 1230  BP: 130/65 126/64  Pulse: 72 70  Resp: 18 20  Temp: 36.4 C 36.4 C  SpO2: 100% 94%    Last Pain:  Vitals:   02/12/23 1230  TempSrc:   PainSc: 0-No pain                 Beryle Lathe

## 2023-02-12 NOTE — Op Note (Signed)
    Patient name: Rebecca Bridges MRN: [id number] DOB: [birth year] Sex: female  02/12/2023 Pre-operative Diagnosis: ESRD Post-operative diagnosis:  Same Surgeon:  Durene Cal Assistants:  Carolynn Sayers, MD Procedure:   revision / new left radio-cephalic fistula  Anesthesia:  MAC Blood Loss:  minimal Specimens:  none  Findings:  3mm radial artery.  The anastomosis was more proximal to the radial artery, excluding the proximal portion of the previous fistula.  The vein was greater than 5 mm.  Indications: This is a 47 year old female with end-stage renal disease.  She had a left radiocephalic fistula placed.  This has matured nicely however it is not functioning because of the stenosis and a sclerotic proximal portion of the vein.  She could not have this done at [facility] because she needed cardiac anesthesia.  She comes in today for her procedure.  Procedure:  The patient was identified in the holding area and taken to [facility] OR ROOM 01  The patient was then placed supine on the table. general anesthesia was administered.  The patient was prepped and draped in the usual sterile fashion.  A time out was called and antibiotics were administered.  Ultrasound was used to evaluate the left radial artery.  This appeared to be acceptable source of inflow for revision of the fistula.  At transverse incision was made just proximal to the previous incision after anesthetizing it with 1% lidocaine.  We first dissected out the cephalic vein which was a 5 mm vein.  It was fully mobilized.  Side branches were ligated.  Next, the radial artery was dissected free.  This was proximal to the previous anastomosis.  This was a 3 mm disease-free artery.  Next, the vein was marked for orientation and ligated.  It distended nicely.  The radial artery was then occluded with baby Gregory clamps and opened with a #11 blade.  This was extended longitudinally with Potts scissors.  The vein was cut to the appropriate length and spatulated  fit this as the arteriotomy.  A running anastomosis was created with 6-0 Prolene.  Prior to completion the appropriate flushing maneuvers were performed and the anastomosis was completed.  There was an excellent thrill within the fistula and palpable radial pulse distal to the anastomosis.  Balloon was then irrigated.  Hemostasis was achieved.  The subcutaneous tissue was reapproximated 3-0 Vicryl.  Skin was closed with 4 Monocryl and Dermabond.  There were no immediate complications.     Disposition:  To PACU stable.   Juleen China, M.D., [facility] Special[facility] of [place] Office: [phone] Pager:  [phone]

## 2023-02-13 ENCOUNTER — Encounter (HOSPITAL_COMMUNITY): Payer: Self-pay | Admitting: Surgery

## 2023-02-24 ENCOUNTER — Other Ambulatory Visit: Payer: Self-pay | Admitting: Internal Medicine

## 2023-02-24 DIAGNOSIS — R188 Other ascites: Secondary | ICD-10-CM

## 2023-02-26 ENCOUNTER — Ambulatory Visit: Admission: RE | Admit: 2023-02-26 | Payer: 59 | Source: Ambulatory Visit

## 2023-03-11 ENCOUNTER — Ambulatory Visit
Admission: RE | Admit: 2023-03-11 | Discharge: 2023-03-11 | Disposition: A | Discharge status: Home / Self Care | Payer: 59 | Source: Ambulatory Visit | Attending: Internal Medicine | Admitting: Internal Medicine

## 2023-03-11 DIAGNOSIS — R188 Other ascites: Secondary | ICD-10-CM | POA: Insufficient documentation

## 2023-03-11 MED ORDER — ALBUMIN HUMAN 25 % IV SOLN
25.0000 g | Freq: Once | INTRAVENOUS | Status: AC
  Administered 2023-03-11: 25 g via INTRAVENOUS

## 2023-03-11 MED ORDER — LIDOCAINE HCL (PF) 1 % IJ SOLN
10.0000 mL | Freq: Once | INTRAMUSCULAR | Status: AC
  Administered 2023-03-11: 10 mL via INTRADERMAL
  Filled 2023-03-11: qty 10

## 2023-03-11 MED ORDER — ALBUMIN HUMAN 25 % IV SOLN
INTRAVENOUS | Status: AC
  Filled 2023-03-11: qty 100

## 2023-03-11 NOTE — Procedures (Signed)
PROCEDURE SUMMARY:  Successful US guided paracentesis from LLQ.  Yielded 3.6 L of clear yellow fluid.  No immediate complications.  Pt tolerated well.   Specimen was not sent for labs.  EBL < 5mL  Berneta Levins PA-C 03/11/2023 11:47 AM

## 2023-03-22 ENCOUNTER — Other Ambulatory Visit: Payer: Self-pay | Admitting: Internal Medicine

## 2023-03-22 DIAGNOSIS — R188 Other ascites: Secondary | ICD-10-CM

## 2023-03-26 ENCOUNTER — Ambulatory Visit
Admission: RE | Admit: 2023-03-26 | Discharge: 2023-03-26 | Disposition: A | Discharge status: Home / Self Care | Payer: 59 | Source: Ambulatory Visit | Attending: Internal Medicine | Admitting: Internal Medicine

## 2023-03-26 DIAGNOSIS — R188 Other ascites: Secondary | ICD-10-CM | POA: Diagnosis present

## 2023-03-26 MED ORDER — LIDOCAINE HCL (PF) 1 % IJ SOLN
10.0000 mL | Freq: Once | INTRAMUSCULAR | Status: AC
  Administered 2023-03-26: 10 mL via SUBCUTANEOUS
  Filled 2023-03-26: qty 10

## 2023-03-26 NOTE — Procedures (Signed)
PROCEDURE SUMMARY:  Successful image-guided paracentesis from the left lower abdomen.  Yielded 1.8 liters of amber fluid.  No immediate complications.  EBL = trace. Patient tolerated well.   Specimen was not sent for labs.  Please see imaging section of Epic for full dictation.   Kennieth Francois PA-C 03/26/2023 2:22 PM

## 2023-03-29 ENCOUNTER — Other Ambulatory Visit (INDEPENDENT_AMBULATORY_CARE_PROVIDER_SITE_OTHER): Payer: Self-pay | Admitting: Vascular Surgery

## 2023-03-29 DIAGNOSIS — N186 End stage renal disease: Secondary | ICD-10-CM

## 2023-04-02 ENCOUNTER — Encounter (INDEPENDENT_AMBULATORY_CARE_PROVIDER_SITE_OTHER): Payer: 59

## 2023-04-02 ENCOUNTER — Ambulatory Visit (INDEPENDENT_AMBULATORY_CARE_PROVIDER_SITE_OTHER): Payer: 59 | Admitting: Nurse Practitioner

## 2023-04-06 ENCOUNTER — Other Ambulatory Visit: Payer: Self-pay | Admitting: Internal Medicine

## 2023-04-06 DIAGNOSIS — R188 Other ascites: Secondary | ICD-10-CM

## 2023-04-08 ENCOUNTER — Ambulatory Visit
Admission: RE | Admit: 2023-04-08 | Discharge: 2023-04-08 | Disposition: A | Discharge status: Home / Self Care | Payer: 59 | Source: Ambulatory Visit | Attending: Internal Medicine | Admitting: Internal Medicine

## 2023-04-08 DIAGNOSIS — R188 Other ascites: Secondary | ICD-10-CM | POA: Insufficient documentation

## 2023-04-08 MED ORDER — LIDOCAINE HCL (PF) 1 % IJ SOLN
25.0000 mL | Freq: Once | INTRAMUSCULAR | Status: DC
  Filled 2023-04-08: qty 25

## 2023-04-08 NOTE — Procedures (Signed)
PROCEDURE SUMMARY:  Successful ultrasound guided paracentesis from the left lower abdomen.   Yielded 2.7 L of amber fluid.  No immediate complications.  The patient tolerated the procedure well.   Specimen was not sent for labs.  EBL < 1 mL    Kennieth Francois PA-C 04/08/2023 10:03 AM

## 2023-04-12 ENCOUNTER — Ambulatory Visit (INDEPENDENT_AMBULATORY_CARE_PROVIDER_SITE_OTHER): Payer: 59

## 2023-04-12 DIAGNOSIS — N186 End stage renal disease: Secondary | ICD-10-CM

## 2023-04-17 ENCOUNTER — Other Ambulatory Visit: Payer: Self-pay

## 2023-04-17 ENCOUNTER — Inpatient Hospital Stay: Payer: 59

## 2023-04-17 ENCOUNTER — Emergency Department: Payer: 59

## 2023-04-17 ENCOUNTER — Inpatient Hospital Stay
Admission: EM | Admit: 2023-04-17 | Discharge: 2023-04-26 | DRG: 871 | Disposition: A | Discharge status: Home / Self Care | Payer: 59 | Attending: Internal Medicine | Admitting: Internal Medicine

## 2023-04-17 DIAGNOSIS — Z86711 Personal history of pulmonary embolism: Secondary | ICD-10-CM

## 2023-04-17 DIAGNOSIS — K7460 Unspecified cirrhosis of liver: Secondary | ICD-10-CM | POA: Diagnosis present

## 2023-04-17 DIAGNOSIS — E872 Acidosis, unspecified: Secondary | ICD-10-CM | POA: Diagnosis present

## 2023-04-17 DIAGNOSIS — I420 Dilated cardiomyopathy: Secondary | ICD-10-CM | POA: Diagnosis present

## 2023-04-17 DIAGNOSIS — D62 Acute posthemorrhagic anemia: Secondary | ICD-10-CM | POA: Diagnosis present

## 2023-04-17 DIAGNOSIS — D638 Anemia in other chronic diseases classified elsewhere: Secondary | ICD-10-CM | POA: Insufficient documentation

## 2023-04-17 DIAGNOSIS — R040 Epistaxis: Secondary | ICD-10-CM | POA: Diagnosis not present

## 2023-04-17 DIAGNOSIS — I132 Hypertensive heart and chronic kidney disease with heart failure and with stage 5 chronic kidney disease, or end stage renal disease: Secondary | ICD-10-CM | POA: Diagnosis present

## 2023-04-17 DIAGNOSIS — E11649 Type 2 diabetes mellitus with hypoglycemia without coma: Secondary | ICD-10-CM | POA: Diagnosis not present

## 2023-04-17 DIAGNOSIS — R6520 Severe sepsis without septic shock: Secondary | ICD-10-CM | POA: Diagnosis present

## 2023-04-17 DIAGNOSIS — E871 Hypo-osmolality and hyponatremia: Secondary | ICD-10-CM | POA: Diagnosis present

## 2023-04-17 DIAGNOSIS — J90 Pleural effusion, not elsewhere classified: Secondary | ICD-10-CM | POA: Diagnosis not present

## 2023-04-17 DIAGNOSIS — I5043 Acute on chronic combined systolic (congestive) and diastolic (congestive) heart failure: Secondary | ICD-10-CM | POA: Diagnosis present

## 2023-04-17 DIAGNOSIS — G4700 Insomnia, unspecified: Secondary | ICD-10-CM | POA: Diagnosis present

## 2023-04-17 DIAGNOSIS — R7881 Bacteremia: Secondary | ICD-10-CM

## 2023-04-17 DIAGNOSIS — E440 Moderate protein-calorie malnutrition: Secondary | ICD-10-CM | POA: Diagnosis present

## 2023-04-17 DIAGNOSIS — B182 Chronic viral hepatitis C: Secondary | ICD-10-CM | POA: Diagnosis not present

## 2023-04-17 DIAGNOSIS — Z79899 Other long term (current) drug therapy: Secondary | ICD-10-CM

## 2023-04-17 DIAGNOSIS — I5023 Acute on chronic systolic (congestive) heart failure: Secondary | ICD-10-CM | POA: Diagnosis not present

## 2023-04-17 DIAGNOSIS — B20 Human immunodeficiency virus [HIV] disease: Secondary | ICD-10-CM | POA: Diagnosis present

## 2023-04-17 DIAGNOSIS — J181 Lobar pneumonia, unspecified organism: Secondary | ICD-10-CM | POA: Diagnosis present

## 2023-04-17 DIAGNOSIS — I340 Nonrheumatic mitral (valve) insufficiency: Secondary | ICD-10-CM | POA: Diagnosis present

## 2023-04-17 DIAGNOSIS — E669 Obesity, unspecified: Secondary | ICD-10-CM | POA: Diagnosis present

## 2023-04-17 DIAGNOSIS — R188 Other ascites: Secondary | ICD-10-CM | POA: Diagnosis not present

## 2023-04-17 DIAGNOSIS — Z8249 Family history of ischemic heart disease and other diseases of the circulatory system: Secondary | ICD-10-CM

## 2023-04-17 DIAGNOSIS — Z681 Body mass index (BMI) 19 or less, adult: Secondary | ICD-10-CM

## 2023-04-17 DIAGNOSIS — E785 Hyperlipidemia, unspecified: Secondary | ICD-10-CM | POA: Diagnosis present

## 2023-04-17 DIAGNOSIS — R195 Other fecal abnormalities: Secondary | ICD-10-CM | POA: Diagnosis not present

## 2023-04-17 DIAGNOSIS — R1030 Lower abdominal pain, unspecified: Secondary | ICD-10-CM | POA: Diagnosis not present

## 2023-04-17 DIAGNOSIS — I770 Arteriovenous fistula, acquired: Secondary | ICD-10-CM

## 2023-04-17 DIAGNOSIS — J440 Chronic obstructive pulmonary disease with acute lower respiratory infection: Secondary | ICD-10-CM | POA: Diagnosis present

## 2023-04-17 DIAGNOSIS — Z9049 Acquired absence of other specified parts of digestive tract: Secondary | ICD-10-CM

## 2023-04-17 DIAGNOSIS — J189 Pneumonia, unspecified organism: Principal | ICD-10-CM

## 2023-04-17 DIAGNOSIS — I2510 Atherosclerotic heart disease of native coronary artery without angina pectoris: Secondary | ICD-10-CM | POA: Diagnosis present

## 2023-04-17 DIAGNOSIS — Z992 Dependence on renal dialysis: Secondary | ICD-10-CM

## 2023-04-17 DIAGNOSIS — E1122 Type 2 diabetes mellitus with diabetic chronic kidney disease: Secondary | ICD-10-CM | POA: Diagnosis present

## 2023-04-17 DIAGNOSIS — R109 Unspecified abdominal pain: Secondary | ICD-10-CM | POA: Diagnosis present

## 2023-04-17 DIAGNOSIS — D696 Thrombocytopenia, unspecified: Secondary | ICD-10-CM | POA: Diagnosis not present

## 2023-04-17 DIAGNOSIS — B953 Streptococcus pneumoniae as the cause of diseases classified elsewhere: Secondary | ICD-10-CM | POA: Diagnosis not present

## 2023-04-17 DIAGNOSIS — N179 Acute kidney failure, unspecified: Secondary | ICD-10-CM | POA: Diagnosis present

## 2023-04-17 DIAGNOSIS — Z7901 Long term (current) use of anticoagulants: Secondary | ICD-10-CM

## 2023-04-17 DIAGNOSIS — R042 Hemoptysis: Secondary | ICD-10-CM | POA: Diagnosis present

## 2023-04-17 DIAGNOSIS — J869 Pyothorax without fistula: Secondary | ICD-10-CM | POA: Diagnosis present

## 2023-04-17 DIAGNOSIS — N2581 Secondary hyperparathyroidism of renal origin: Secondary | ICD-10-CM | POA: Diagnosis present

## 2023-04-17 DIAGNOSIS — E875 Hyperkalemia: Secondary | ICD-10-CM | POA: Diagnosis present

## 2023-04-17 DIAGNOSIS — Z882 Allergy status to sulfonamides status: Secondary | ICD-10-CM

## 2023-04-17 DIAGNOSIS — N186 End stage renal disease: Secondary | ICD-10-CM | POA: Diagnosis present

## 2023-04-17 DIAGNOSIS — L89151 Pressure ulcer of sacral region, stage 1: Secondary | ICD-10-CM | POA: Diagnosis not present

## 2023-04-17 DIAGNOSIS — D631 Anemia in chronic kidney disease: Secondary | ICD-10-CM | POA: Diagnosis present

## 2023-04-17 DIAGNOSIS — R519 Headache, unspecified: Secondary | ICD-10-CM | POA: Diagnosis present

## 2023-04-17 DIAGNOSIS — A419 Sepsis, unspecified organism: Secondary | ICD-10-CM | POA: Diagnosis present

## 2023-04-17 DIAGNOSIS — A403 Sepsis due to Streptococcus pneumoniae: Secondary | ICD-10-CM | POA: Diagnosis not present

## 2023-04-17 DIAGNOSIS — R1032 Left lower quadrant pain: Secondary | ICD-10-CM | POA: Diagnosis not present

## 2023-04-17 DIAGNOSIS — Z8616 Personal history of COVID-19: Secondary | ICD-10-CM

## 2023-04-17 DIAGNOSIS — F17210 Nicotine dependence, cigarettes, uncomplicated: Secondary | ICD-10-CM | POA: Diagnosis present

## 2023-04-17 DIAGNOSIS — I447 Left bundle-branch block, unspecified: Secondary | ICD-10-CM | POA: Diagnosis present

## 2023-04-17 DIAGNOSIS — J9601 Acute respiratory failure with hypoxia: Secondary | ICD-10-CM | POA: Diagnosis not present

## 2023-04-17 DIAGNOSIS — Z9889 Other specified postprocedural states: Secondary | ICD-10-CM

## 2023-04-17 DIAGNOSIS — N939 Abnormal uterine and vaginal bleeding, unspecified: Secondary | ICD-10-CM | POA: Diagnosis not present

## 2023-04-17 DIAGNOSIS — L8990 Pressure ulcer of unspecified site, unspecified stage: Secondary | ICD-10-CM

## 2023-04-17 DIAGNOSIS — Z21 Asymptomatic human immunodeficiency virus [HIV] infection status: Secondary | ICD-10-CM | POA: Diagnosis not present

## 2023-04-17 DIAGNOSIS — Z91148 Patient's other noncompliance with medication regimen for other reason: Secondary | ICD-10-CM

## 2023-04-17 DIAGNOSIS — M25532 Pain in left wrist: Secondary | ICD-10-CM | POA: Diagnosis present

## 2023-04-17 DIAGNOSIS — Z888 Allergy status to other drugs, medicaments and biological substances status: Secondary | ICD-10-CM

## 2023-04-17 LAB — COMPREHENSIVE METABOLIC PANEL
ALT: 19 U/L (ref 0–44)
ALT: 16 U/L (ref 0–44)
AST: 33 U/L (ref 15–41)
AST: 37 U/L (ref 15–41)
Albumin: 2.8 g/dL — ABNORMAL LOW (ref 3.5–5.0)
Albumin: 2.5 g/dL — ABNORMAL LOW (ref 3.5–5.0)
Alkaline Phosphatase: 79 U/L (ref 38–126)
Alkaline Phosphatase: 68 U/L (ref 38–126)
Anion gap: 11 (ref 5–15)
Anion gap: 9 (ref 5–15)
BUN: 35 mg/dL — ABNORMAL HIGH (ref 6–20)
BUN: 18 mg/dL (ref 6–20)
CO2: 21 mmol/L — ABNORMAL LOW (ref 22–32)
CO2: 26 mmol/L (ref 22–32)
Calcium: 8.2 mg/dL — ABNORMAL LOW (ref 8.9–10.3)
Calcium: 7.6 mg/dL — ABNORMAL LOW (ref 8.9–10.3)
Chloride: 96 mmol/L — ABNORMAL LOW (ref 98–111)
Chloride: 94 mmol/L — ABNORMAL LOW (ref 98–111)
Creatinine, Ser: 6.13 mg/dL — ABNORMAL HIGH (ref 0.44–1.00)
Creatinine, Ser: 3.65 mg/dL — ABNORMAL HIGH (ref 0.44–1.00)
GFR, Estimated: 8 mL/min — ABNORMAL LOW (ref >60)
GFR, Estimated: 15 mL/min — ABNORMAL LOW (ref >60)
Glucose, Bld: 97 mg/dL (ref 70–99)
Glucose, Bld: 125 mg/dL — ABNORMAL HIGH (ref 70–99)
Potassium: 6.3 mmol/L (ref 3.5–5.1)
Potassium: 3.3 mmol/L — ABNORMAL LOW (ref 3.5–5.1)
Sodium: 128 mmol/L — ABNORMAL LOW (ref 135–145)
Sodium: 129 mmol/L — ABNORMAL LOW (ref 135–145)
Total Bilirubin: 1.7 mg/dL — ABNORMAL HIGH (ref 0.3–1.2)
Total Bilirubin: 1.7 mg/dL — ABNORMAL HIGH (ref 0.3–1.2)
Total Protein: 8.9 g/dL — ABNORMAL HIGH (ref 6.5–8.1)
Total Protein: 8.1 g/dL (ref 6.5–8.1)

## 2023-04-17 LAB — GLUCOSE, CAPILLARY
Glucose-Capillary: 47 mg/dL — ABNORMAL LOW (ref 70–99)
Glucose-Capillary: 172 mg/dL — ABNORMAL HIGH (ref 70–99)

## 2023-04-17 LAB — CBC
HCT: 32.3 % — ABNORMAL LOW (ref 36.0–46.0)
Hemoglobin: 10 g/dL — ABNORMAL LOW (ref 12.0–15.0)
MCH: 33.6 pg (ref 26.0–34.0)
MCHC: 31 g/dL (ref 30.0–36.0)
MCV: 108.4 fL — ABNORMAL HIGH (ref 80.0–100.0)
Platelets: 127 10*3/uL — ABNORMAL LOW (ref 150–400)
RBC: 2.98 MIL/uL — ABNORMAL LOW (ref 3.87–5.11)
RDW: 18.2 % — ABNORMAL HIGH (ref 11.5–15.5)
WBC: 12.2 10*3/uL — ABNORMAL HIGH (ref 4.0–10.5)
nRBC: 0 % (ref 0.0–0.2)

## 2023-04-17 LAB — LACTIC ACID, PLASMA
Lactic Acid, Venous: 3.3 mmol/L (ref 0.5–1.9)
Lactic Acid, Venous: 2.7 mmol/L (ref 0.5–1.9)

## 2023-04-17 LAB — TROPONIN I (HIGH SENSITIVITY): Troponin I (High Sensitivity): 99 ng/L — ABNORMAL HIGH (ref <18)

## 2023-04-17 LAB — BRAIN NATRIURETIC PEPTIDE: B Natriuretic Peptide: >4500 pg/mL — ABNORMAL HIGH (ref 0.0–100.0)

## 2023-04-17 MED ORDER — CALCIUM ACETATE (PHOS BINDER) 667 MG PO CAPS
2001.0000 mg | ORAL_CAPSULE | Freq: Three times a day (TID) | ORAL | Status: DC
  Administered 2023-04-18 – 2023-04-26 (×20): 2001 mg via ORAL
  Filled 2023-04-17 (×21): qty 3

## 2023-04-17 MED ORDER — GABAPENTIN 300 MG PO CAPS
300.0000 mg | ORAL_CAPSULE | Freq: Every day | ORAL | Status: DC
  Administered 2023-04-18 – 2023-04-25 (×8): 300 mg via ORAL
  Filled 2023-04-17 (×9): qty 1

## 2023-04-17 MED ORDER — PANTOPRAZOLE SODIUM 40 MG PO TBEC
40.0000 mg | DELAYED_RELEASE_TABLET | Freq: Every day | ORAL | Status: DC
  Filled 2023-04-17: qty 1

## 2023-04-17 MED ORDER — HEPARIN SODIUM (PORCINE) 5000 UNIT/ML IJ SOLN: 5000.0000 [IU] | Freq: Two times a day (BID) | INTRAMUSCULAR | Status: DC

## 2023-04-17 MED ORDER — SODIUM CHLORIDE 0.9 % IV BOLUS
500.0000 mL | Freq: Once | INTRAVENOUS | Status: AC
  Administered 2023-04-17: 500 mL via INTRAVENOUS

## 2023-04-17 MED ORDER — SODIUM ZIRCONIUM CYCLOSILICATE 10 G PO PACK
10.0000 g | PACK | Freq: Once | ORAL | Status: AC
  Administered 2023-04-17: 10 g via ORAL
  Filled 2023-04-17: qty 1

## 2023-04-17 MED ORDER — LIDOCAINE HCL (PF) 1 % IJ SOLN: 5.0000 mL | INTRAMUSCULAR | Status: DC | PRN

## 2023-04-17 MED ORDER — METOPROLOL SUCCINATE ER 50 MG PO TB24
25.0000 mg | ORAL_TABLET | Freq: Every day | ORAL | Status: DC
  Filled 2023-04-17: qty 1

## 2023-04-17 MED ORDER — TRAZODONE HCL 50 MG PO TABS
50.0000 mg | ORAL_TABLET | Freq: Every evening | ORAL | Status: DC | PRN
  Administered 2023-04-18 – 2023-04-25 (×7): 50 mg via ORAL
  Filled 2023-04-17 (×8): qty 1

## 2023-04-17 MED ORDER — DEXTROSE 50 % IV SOLN
1.0000 | Freq: Once | INTRAVENOUS | Status: AC
  Administered 2023-04-17: 50 mL via INTRAVENOUS

## 2023-04-17 MED ORDER — FUROSEMIDE 10 MG/ML IJ SOLN
80.0000 mg | Freq: Once | INTRAMUSCULAR | Status: AC
  Administered 2023-04-17: 80 mg via INTRAVENOUS
  Filled 2023-04-17: qty 8

## 2023-04-17 MED ORDER — DEXTROSE 5 % IV SOLN
500.0000 mg | INTRAVENOUS | Status: DC
  Administered 2023-04-17 – 2023-04-18 (×2): 500 mg via INTRAVENOUS
  Filled 2023-04-17: qty 500
  Filled 2023-04-17 (×2): qty 5

## 2023-04-17 MED ORDER — SODIUM CHLORIDE 0.9 % IV BOLUS
250.0000 mL | Freq: Once | INTRAVENOUS | Status: AC
  Administered 2023-04-17: 250 mL via INTRAVENOUS

## 2023-04-17 MED ORDER — CHLORHEXIDINE GLUCONATE CLOTH 2 % EX PADS
6.0000 | MEDICATED_PAD | Freq: Every day | CUTANEOUS | Status: DC
  Administered 2023-04-18 – 2023-04-26 (×8): 6 via TOPICAL

## 2023-04-17 MED ORDER — ANTICOAGULANT SODIUM CITRATE 4% (200MG/5ML) IV SOLN: 5.0000 mL | Status: DC | PRN

## 2023-04-17 MED ORDER — MORPHINE SULFATE (PF) 4 MG/ML IV SOLN
4.0000 mg | Freq: Once | INTRAVENOUS | Status: AC
  Administered 2023-04-17: 4 mg via INTRAVENOUS
  Filled 2023-04-17: qty 1

## 2023-04-17 MED ORDER — HYDROMORPHONE HCL 1 MG/ML IJ SOLN
0.5000 mg | INTRAMUSCULAR | Status: DC | PRN
  Administered 2023-04-17 – 2023-04-24 (×7): 0.5 mg via INTRAVENOUS
  Filled 2023-04-17 (×4): qty 0.5
  Filled 2023-04-17: qty 1
  Filled 2023-04-17 (×2): qty 0.5

## 2023-04-17 MED ORDER — BICTEGRAVIR-EMTRICITAB-TENOFOV 50-200-25 MG PO TABS
1.0000 | ORAL_TABLET | Freq: Every day | ORAL | Status: DC
  Administered 2023-04-18 – 2023-04-26 (×9): 1 via ORAL
  Filled 2023-04-17 (×9): qty 1

## 2023-04-17 MED ORDER — HEPARIN SODIUM (PORCINE) 1000 UNIT/ML IJ SOLN
INTRAMUSCULAR | Status: AC
Start: 2023-04-17 — End: ?
  Filled 2023-04-17: qty 10

## 2023-04-17 MED ORDER — LEVALBUTEROL HCL 1.25 MG/0.5ML IN NEBU
1.2500 mg | INHALATION_SOLUTION | Freq: Four times a day (QID) | RESPIRATORY_TRACT | Status: DC | PRN
  Administered 2023-04-18: 1.25 mg via RESPIRATORY_TRACT
  Filled 2023-04-17: qty 0.5

## 2023-04-17 MED ORDER — ALTEPLASE 2 MG IJ SOLR: 2.0000 mg | Freq: Once | INTRAMUSCULAR | Status: DC | PRN

## 2023-04-17 MED ORDER — HEPARIN SODIUM (PORCINE) 1000 UNIT/ML DIALYSIS: 1000.0000 [IU] | INTRAMUSCULAR | Status: DC | PRN

## 2023-04-17 MED ORDER — OXYCODONE-ACETAMINOPHEN 5-325 MG PO TABS
1.0000 | ORAL_TABLET | ORAL | Status: DC | PRN
  Administered 2023-04-18 – 2023-04-26 (×16): 1 via ORAL
  Filled 2023-04-17 (×18): qty 1

## 2023-04-17 MED ORDER — SODIUM CHLORIDE 0.9 % IV SOLN
2.0000 g | INTRAVENOUS | Status: DC
  Administered 2023-04-17 – 2023-04-20 (×4): 2 g via INTRAVENOUS
  Filled 2023-04-17 (×5): qty 20

## 2023-04-17 MED ORDER — TORSEMIDE 20 MG PO TABS
40.0000 mg | ORAL_TABLET | Freq: Every day | ORAL | Status: DC
  Administered 2023-04-18 – 2023-04-21 (×4): 40 mg via ORAL
  Filled 2023-04-17 (×6): qty 2

## 2023-04-17 MED ORDER — METOCLOPRAMIDE HCL 5 MG/ML IJ SOLN
5.0000 mg | Freq: Four times a day (QID) | INTRAMUSCULAR | Status: DC | PRN
  Administered 2023-04-20 (×2): 5 mg via INTRAVENOUS
  Filled 2023-04-17 (×2): qty 2
  Filled 2023-04-17: qty 1

## 2023-04-17 MED ORDER — DAPSONE 25 MG PO TABS
50.0000 mg | ORAL_TABLET | Freq: Two times a day (BID) | ORAL | Status: DC
  Administered 2023-04-18 – 2023-04-26 (×17): 50 mg via ORAL
  Filled 2023-04-17 (×18): qty 2

## 2023-04-17 MED ORDER — LIDOCAINE-PRILOCAINE 2.5-2.5 % EX CREA: 1.0000 | TOPICAL_CREAM | CUTANEOUS | Status: DC | PRN

## 2023-04-17 MED ORDER — APIXABAN 5 MG PO TABS
5.0000 mg | ORAL_TABLET | Freq: Two times a day (BID) | ORAL | Status: DC
  Administered 2023-04-18 – 2023-04-20 (×5): 5 mg via ORAL
  Filled 2023-04-17 (×7): qty 1

## 2023-04-17 MED ORDER — PENTAFLUOROPROP-TETRAFLUOROETH EX AERO: 1.0000 | INHALATION_SPRAY | CUTANEOUS | Status: DC | PRN

## 2023-04-17 NOTE — ED Notes (Signed)
Pt going to dialysis. Transport tech will bring back monitor and cords.

## 2023-04-17 NOTE — Progress Notes (Signed)
Referring Provider: No ref. provider found Primary Care Physician:  [facility] Primary Nephrologist:  Dr. Thedore Mins  Reason for Consultation: ESRD  HPI: 47 year old female with history of hypertension, coronary artery disease, congestive heart failure, HIV, cirrhosis of the liver, end-stage renal disease on dialysis now admitted with history of shortness of breath, cough, fever.  She is found to have fluid overload and also possible pneumonia.  She is on a Tuesday Thursday Saturday schedule for dialysis.  Last dialysis was Thursday.  She also has a potassium of 6.3.  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   a.) occluded anterior tibial, tibioperoneal trunk, and posterior tibial arteries --> thromboembolectomy and PTA of the anterior tibial artery   CAD (coronary artery disease) 02/15/2019   a.) R/LHC 02/15/2019: mild (non-obstructive) diffuse LAD and LCx disease   COPD (chronic obstructive pulmonary disease) (HCC)    COVID    Dilated cardiomyopathy (HCC)    a.) TTE 11/27/2013: EF 20%; b.) TTE 03/15/2015: EF 20%; c.) TTE 12/09/2015: EF 45%; d.) TTE 12/22/2016: EF 35%; e.) TTE 08/08/2017: EF 45-50%; f.) TTE 01/27/2019: EF 20-25%; g.) TTE 01/13/2021: EF <15%; h.) TTE 11/21/2021: EF <15%   ESRD (end stage renal disease) on dialysis (HCC)    a.) T-Th-Sat   GERD (gastroesophageal reflux disease)    Hepatitis C virus    HFrEF (heart failure with reduced ejection fraction) (HCC)    a.) TTE 11/27/2013: EF 20%; b.) TTE 03/15/2015: EF 20%; c.) TTE 12/09/2015: EF 45%; d.) TTE 12/22/2016: EF 35%, G1DD; e.) TTE 08/08/2017: EF 45-50%, G1DD; f.) TTE 01/27/2019: EF 20-25%, G1DD; g.) R/LHC 02/15/2019: mPA 25, mPCWP 13, CO 4.84, CI 3.15; h.) TTE 01/13/2021: EF <15%, sev glob HK, sep/apical AK, mod asym LVH, sev LAE, mild RAE, G2DD; I.) TTE 11/21/2021: EF <15%, G2DD   History of noncompliance with medical treatment    HIV (human  immunodeficiency virus infection) (HCC) 2015   a.) on bictegravir-emtricitabine-tenofovir (Biktarvy)   Hypertension    Insomnia    a.) on Trazodone PRN   LBBB (left bundle branch block)    Liver disease, chronic, with cirrhosis (HCC)    Long term current use of anticoagulant    a.) apixaban   Pancytopenia (HCC)    Prolonged Q-T interval on ECG    Splenomegaly    Tobacco abuse     Past Surgical History:  Procedure Laterality Date   A/V FISTULAGRAM Left 03/31/2022   Procedure: A/V Fistulagram;  Surgeon: Renford Dills, MD;  Location: [facility];  Service: Cardiovascular;  Laterality: Left;   AV FISTULA PLACEMENT Left 02/12/2023   Procedure: LEFT ARM FISTULA CREATION;  Surgeon: Nada Libman, MD;  Location: [facility] OR;  Service: Vascular;  Laterality: Left;   CHOLECYSTECTOMY     LOWER EXTREMITY ANGIOGRAPHY Left 01/25/2019   Procedure: Lower Extremity Angiography;  Surgeon: Renford Dills, MD;  Location: [facility];  Service: Cardiovascular;  Laterality: Left;   RIGHT/LEFT HEART CATH AND CORONARY ANGIOGRAPHY Bilateral 02/15/2019   Procedure: RIGHT/LEFT HEART CATH AND CORONARY ANGIOGRAPHY;  Surgeon: Antonieta Iba, MD;  Location: [facility];  Service: Cardiovascular;  Laterality: Bilateral;    Prior to Admission medications   Medication Sig Start Date End Date Taking? Authorizing Provider  apixaban (ELIQUIS) 5 MG TABS tablet Take 5 mg by mouth 2 (two) times daily. 08/04/22   [provider]  B Complex-C-Folic Acid (RENA-VITE RX) 1  MG TABS Take 1 tablet by mouth Every Tuesday,Thursday,and Saturday with dialysis. 07/17/21   [provider]  bictegravir-emtricitabine-tenofovir AF (BIKTARVY) 50-200-25 MG TABS tablet Take 1 tablet by mouth at bedtime.    [provider]  calcium acetate (PHOSLO) 667 MG capsule Take 2,001 mg by mouth 3 (three) times daily. 04/22/21   [provider]  dapsone 25 MG tablet Take 25 mg by mouth 2  (two) times daily.    [provider]  furosemide (LASIX) 80 MG tablet Take 80 mg by mouth daily. 03/17/22   [provider]  gabapentin (NEURONTIN) 300 MG capsule Take 300 mg by mouth at bedtime. 03/12/22   [provider]  metoprolol succinate (TOPROL-XL) 25 MG 24 hr tablet Take 1 tablet by mouth daily. 12/30/22 12/30/23  [provider]  oxyCODONE-acetaminophen (PERCOCET) 5-325 MG tablet Take 1 tablet by mouth every 4 (four) hours as needed for severe pain. 02/12/23 02/12/24  Nada Libman, MD  pantoprazole (PROTONIX) 40 MG tablet Take 40 mg by mouth daily. 04/25/22   [provider]  prednisoLONE acetate (PRED FORTE) 1 % ophthalmic suspension Place 1 drop into the right eye daily as needed (Torn lens). 07/16/21   [provider]  traZODone (DESYREL) 50 MG tablet Take 50 mg by mouth at bedtime as needed for sleep. 03/24/22   [provider]    Current Facility-Administered Medications  Medication Dose Route Frequency Provider Last Rate Last Admin   azithromycin (ZITHROMAX) 500 mg in dextrose 5 % 250 mL IVPB  500 mg Intravenous Q24H Jene Every, MD       cefTRIAXone (ROCEPHIN) 2 g in sodium chloride 0.9 % 100 mL IVPB  2 g Intravenous Q24H Jene Every, MD       sodium chloride 0.9 % bolus 500 mL  500 mL Intravenous Once Jene Every, MD       Current Outpatient Medications  Medication Sig Dispense Refill   apixaban (ELIQUIS) 5 MG TABS tablet Take 5 mg by mouth 2 (two) times daily.     B Complex-C-Folic Acid (RENA-VITE RX) 1 MG TABS Take 1 tablet by mouth Every Tuesday,Thursday,and Saturday with dialysis.     bictegravir-emtricitabine-tenofovir AF (BIKTARVY) 50-200-25 MG TABS tablet Take 1 tablet by mouth at bedtime.     calcium acetate (PHOSLO) 667 MG capsule Take 2,001 mg by mouth 3 (three) times daily.     dapsone 25 MG tablet Take 25 mg by mouth 2 (two) times daily.     furosemide (LASIX) 80 MG tablet Take 80 mg by mouth  daily.     gabapentin (NEURONTIN) 300 MG capsule Take 300 mg by mouth at bedtime.     metoprolol succinate (TOPROL-XL) 25 MG 24 hr tablet Take 1 tablet by mouth daily.     oxyCODONE-acetaminophen (PERCOCET) 5-325 MG tablet Take 1 tablet by mouth every 4 (four) hours as needed for severe pain. 6 tablet 0   pantoprazole (PROTONIX) 40 MG tablet Take 40 mg by mouth daily.     prednisoLONE acetate (PRED FORTE) 1 % ophthalmic suspension Place 1 drop into the right eye daily as needed (Torn lens).     traZODone (DESYREL) 50 MG tablet Take 50 mg by mouth at bedtime as needed for sleep.      Allergies as of 04/17/2023 - Review Complete 04/17/2023  Allergen Reaction Noted   Neosporin [bacitracin-polymyxin b] Rash 09/24/2021   Sulfa antibiotics Rash 08/07/2017    Family History  Problem Relation Age of Onset  Hypertension Mother    Cerebral aneurysm Mother     Social History   Socioeconomic History   Marital status: Single    Spouse name: Not on file   Number of children: Not on file   Years of education: Not on file   Highest education level: Not on file  Occupational History   Not on file  Tobacco Use   Smoking status: Every Day    Current packs/day: 0.25    Types: Cigarettes   Smokeless tobacco: Never   Tobacco comments:    5 cigarettes a day  Vaping Use   Vaping status: Never Used  Substance and Sexual Activity   Alcohol use: No   Drug use: No   Sexual activity: Not Currently  Other Topics Concern   Not on file  Social History Narrative   Not on file   Social Determinants of Health   Financial Resource Strain: Low Risk  (12/29/2022)   Received from [facility]   Overall Financial Resource Strain (CARDIA)    Difficulty of Paying Living Expenses: Not hard at all  Food Insecurity: No Food Insecurity (12/29/2022)   Received from [facility]   Hunger Vital Sign    Worried About Running Out of Food in the Last Year: Never true    Ran  Out of Food in the Last Year: Never true  Transportation Needs: No Transportation Needs (12/29/2022)   Received from [facility] - Transportation    In the past 12 months, has lack of transportation kept you from medical appointments or from getting medications?: No    Lack of Transportation (Non-Medical): No  Physical Activity: Inactive (04/26/2019)   Exercise Vital Sign    Days of Exercise per Week: 0 days    Minutes of Exercise per Session: 0 min  Stress: No Stress Concern Present (04/26/2019)   [employer] of Occupational Health - Occupational Stress Questionnaire    Feeling of Stress : Not at all  Social Connections: Moderately Isolated (04/26/2019)   Social Connection and Isolation Panel [NHANES]    Frequency of Communication with Friends and Family: More than three times a week    Frequency of Social Gatherings with Friends and Family: More than three times a week    Attends Religious Services: 1 to 4 times per year    Active Member of [employer] or Organizations: No    Attends Banker Meetings: Never    Marital Status: Never married  Intimate Partner Violence: Not At Risk (10/05/2022)   Humiliation, Afraid, Rape, and Kick questionnaire    Fear of Current or Ex-Partner: No    Emotionally Abused: No    Physically Abused: No    Sexually Abused: No    Physical Exam: Vital signs in last 24 hours: Temp:  [98.2 F (36.8 C)] 98.2 F (36.8 C) (11/02 1422) Pulse Rate:  [133-136] 133 (11/02 1420) Resp:  [34-39] 39 (11/02 1420) BP: (113)/(81) 113/81 (11/02 1420) SpO2:  [99 %-100 %] 99 % (11/02 1420)   General:   Alert,  Well-developed, well-nourished, pleasant and cooperative in NAD Head:  Normocephalic and atraumatic. Eyes:  Sclera clear, no icterus.   Conjunctiva pink. Ears:  Normal auditory acuity. Nose:  No deformity, discharge,  or lesions. Lungs:  Clear throughout to auscultation.   No wheezes, crackles, or rhonchi. No acute  distress. Heart:  Regular rate and rhythm; no murmurs, clicks, rubs,  or gallops. Abdomen:  Soft, nontender  and nondistended. No masses, hepatosplenomegaly or hernias noted. Normal bowel sounds, without guarding, and without rebound.   Extremities:  Without clubbing or edema.  Intake/Output from previous day: No intake/output data recorded. Intake/Output this shift: No intake/output data recorded.  Lab Results: Recent Labs    04/17/23 1423  WBC 12.2*  HGB 10.0*  HCT 32.3*  PLT 127*   BMET Recent Labs    04/17/23 1423  NA 128*  K 6.3*  CL 96*  CO2 21*  GLUCOSE 97  BUN 35*  CREATININE 6.13*  CALCIUM 8.2*   LFT Recent Labs    04/17/23 1423  PROT 8.9*  ALBUMIN 2.8*  AST 33  ALT 19  ALKPHOS 79  BILITOT 1.7*   PT/INR No results for input(s): "LABPROT", "INR" in the last 72 hours. Hepatitis Panel No results for input(s): "HEPBSAG", "HCVAB", "HEPAIGM", "HEPBIGM" in the last 72 hours.  Studies/Results: DG Chest Port 1 View  Result Date: 04/17/2023 CLINICAL DATA:  Acute chest pain EXAM: PORTABLE CHEST 1 VIEW COMPARISON:  12/24/2022 FINDINGS: Heart is enlarged with central vascular congestion. Dense left mid and lower lung collapse/consolidation concerning for pneumonia. Suspect associated left effusion. Right lung remains clear. No pneumothorax. Trachea midline. Aorta atherosclerotic. Right IJ dialysis catheter tip SVC RA junction as before. IMPRESSION: 1. Cardiomegaly with central vascular congestion. 2. Dense left mid and lower lung collapse/consolidation concerning for pneumonia. Electronically Signed   By: Judie Petit.  Shick M.D.   On: 04/17/2023 14:38    Assessment/Plan:  47 year old female with history of hypertension, coronary artery disease, congestive heart failure, HIV, cirrhosis of the liver, end-stage renal disease on dialysis now admitted with history of shortness of breath, cough, fever.  She is found to have fluid overload and also possible pneumonia.  She is on a  Tuesday Thursday Saturday schedule for dialysis.  Last dialysis was Thursday.  She also has a potassium of 6.3.  ESRD: Dialysis via right permacath today.  Will dialyze on 1 K bath.  Fluid removal as tolerated.  ANEMIA: Will follow anemia protocols.  MBD: We will follow-up with the levels of PTH, calcium and phosphorus.  Continue calcium acetate.  HTN/VOL: Advised on 2 g salt restricted diet.  Continue metoprolol.  Pneumonia/CHF: Antibiotics as ordered.  Will attempt fluid removal today.  Hyperkalemia: Will dialyze on 1K bath.  Note for dialysis unit. Labs and medications reviewed. Will continue to monitor closely.    LOS: 0 Lorain Childes, MD [facility] @TODAY @3 :10 PM

## 2023-04-17 NOTE — Plan of Care (Signed)

## 2023-04-17 NOTE — Progress Notes (Signed)
Lab called for critical result. Lactic acid: 3.3. ED RN Caro Laroche was notified as well as Dr. Suezanne Jacquet.

## 2023-04-17 NOTE — ED Provider Notes (Signed)
[facility] Provider Note    Event Date/Time   First MD Initiated Contact with Patient 04/17/23 1412     (approximate)   History   Chest Pain, missed dialysis, and Fever   HPI  Rebecca Bridges is a 47 y.o. female with a history of CAD, COPD, HIV, end-stage renal disease who presents with reports of missed dialysis, chest discomfort.  She reports also mild shortness of breath over the last couple of days.     Physical Exam   Triage Vital Signs: ED Triage Vitals  Encounter Vitals Group     BP 04/17/23 1418 113/81     Systolic BP Percentile --      Diastolic BP Percentile --      Pulse Rate 04/17/23 1418 (!) 136     Resp 04/17/23 1418 (!) 34     Temp 04/17/23 1422 98.2 F (36.8 C)     Temp Source 04/17/23 1422 Axillary     SpO2 04/17/23 1418 100 %     Weight --      Height --      Head Circumference --      Peak Flow --      Pain Score 04/17/23 1412 10     Pain Loc --      Pain Education --      Exclude from Growth Chart --     Most recent vital signs: Vitals:   04/17/23 1420 04/17/23 1422  BP: 113/81   Pulse: (!) 133   Resp: (!) 39   Temp:  98.2 F (36.8 C)  SpO2: 99%      General: Awake, no distress.  CV:  Good peripheral perfusion.  Tachycardia Resp:  Tachypnea noted, no Rales Abd:  No distention.  Other:  No lower extremity edema   ED Results / Procedures / Treatments   Labs (all labs ordered are listed, but only abnormal results are displayed) Labs Reviewed  CBC - Abnormal; Notable for the following components:      Result Value   WBC 12.2 (*)    RBC 2.98 (*)    Hemoglobin 10.0 (*)    HCT 32.3 (*)    MCV 108.4 (*)    RDW 18.2 (*)    Platelets 127 (*)    All other components within normal limits  COMPREHENSIVE METABOLIC PANEL - Abnormal; Notable for the following components:   Sodium 128 (*)    Potassium 6.3 (*)    Chloride 96 (*)    CO2 21 (*)    BUN 35 (*)    Creatinine, Ser 6.13 (*)    Calcium 8.2 (*)     Total Protein 8.9 (*)    Albumin 2.8 (*)    Total Bilirubin 1.7 (*)    GFR, Estimated 8 (*)    All other components within normal limits  BRAIN NATRIURETIC PEPTIDE - Abnormal; Notable for the following components:   B Natriuretic Peptide >4,500.0 (*)    All other components within normal limits  TROPONIN I (HIGH SENSITIVITY) - Abnormal; Notable for the following components:   Troponin I (High Sensitivity) 99 (*)    All other components within normal limits  CULTURE, BLOOD (ROUTINE X 2)  CULTURE, BLOOD (ROUTINE X 2)  LACTIC ACID, PLASMA  LACTIC ACID, PLASMA  HEPATITIS B SURFACE ANTIGEN  HEPATITIS B SURFACE ANTIBODY, QUANTITATIVE     EKG  ED ECG REPORT I, Jene Every, the attending physician, personally viewed and interpreted this ECG.  Date: 04/17/2023  Rhythm: Tach cardia QRS Axis: normal Intervals: Left bundle branch block ST/T Wave abnormalities: normal Narrative Interpretation: no evidence of acute ischemia    RADIOLOGY Chest x-ray with left-sided infiltrate    PROCEDURES:  Critical Care performed: yes CRITICAL CARE Performed by: Jene Every   Total critical care time: 30 minutes  Critical care time was exclusive of separately billable procedures and treating other patients.  Critical care was necessary to treat or prevent imminent or life-threatening deterioration.  Critical care was time spent personally by me on the following activities: development of treatment plan with patient and/or surrogate as well as nursing, discussions with consultants, evaluation of patient's response to treatment, examination of patient, obtaining history from patient or surrogate, ordering and performing treatments and interventions, ordering and review of laboratory studies, ordering and review of radiographic studies, pulse oximetry and re-evaluation of patient's condition.   Procedures   MEDICATIONS ORDERED IN ED: Medications  cefTRIAXone (ROCEPHIN) 2 g in sodium  chloride 0.9 % 100 mL IVPB (2 g Intravenous New Bag/Given 04/17/23 1510)  azithromycin (ZITHROMAX) 500 mg in dextrose 5 % 250 mL IVPB (has no administration in time range)  Chlorhexidine Gluconate Cloth 2 % PADS 6 each (has no administration in time range)  pentafluoroprop-tetrafluoroeth (GEBAUERS) aerosol 1 Application (has no administration in time range)  lidocaine (PF) (XYLOCAINE) 1 % injection 5 mL (has no administration in time range)  lidocaine-prilocaine (EMLA) cream 1 Application (has no administration in time range)  heparin injection 1,000 Units (has no administration in time range)  anticoagulant sodium citrate solution 5 mL (has no administration in time range)  alteplase (CATHFLO ACTIVASE) injection 2 mg (has no administration in time range)  sodium zirconium cyclosilicate (LOKELMA) packet 10 g (has no administration in time range)  morphine (PF) 4 MG/ML injection 4 mg (has no administration in time range)  sodium chloride 0.9 % bolus 250 mL (0 mLs Intravenous Stopped 04/17/23 1512)  sodium chloride 0.9 % bolus 500 mL (500 mLs Intravenous New Bag/Given 04/17/23 1511)     IMPRESSION / MDM / ASSESSMENT AND PLAN / ED COURSE  I reviewed the triage vital signs and the nursing notes. Patient's presentation is most consistent with acute presentation with potential threat to life or bodily function.  Patient presents with chest discomfort, reports of fever although afebrile here, missed dialysis.  Differential includes pulmonary edema, pneumonia, upper respiratory infection, sepsis  Lab work notable for white blood cell count of 12.2, mildly elevated troponin of 99 this could be related to her end-stage renal disease  Potassium noted to be 6.3, consulted Dr. Micki Riley of nephrology, he will see the patient and arrange for urgent dialysis, in the meantime we will give a dose of Lokelma  Chest x-ray demonstrates significant left-sided infiltrate consistent with pneumonia, will start  Rocephin, azithromycin  ----------------------------------------- 3:17 PM on 04/17/2023 -----------------------------------------  IV fluids are infusing, pending lactic acid and blood culture.  I have discussed with the hospitalist for admission        FINAL CLINICAL IMPRESSION(S) / ED DIAGNOSES   Final diagnoses:  Pneumonia of left upper lobe due to infectious organism  Hyperkalemia     Rx / DC Orders   ED Discharge Orders     None        Note:  This document was prepared using Dragon voice recognition software and may include unintentional dictation errors.   Jene Every, MD 04/17/23 [phone]

## 2023-04-17 NOTE — Sepsis Progress Note (Addendum)
Elink following code sepsis  1st lactic acid never resulted. Contacted bedside Rn who was caring for patient prior to HD to see if she can track down the result, it says it was collected in the chart

## 2023-04-17 NOTE — Consult Note (Signed)
CODE SEPSIS - PHARMACY COMMUNICATION  **Broad Spectrum Antibiotics should be administered within 1 hour of Sepsis diagnosis**  Time Code Sepsis Called/Page Received: 1455  Antibiotics Ordered: ceftriaxone  Time of 1st antibiotic administration: 1510  Additional action taken by pharmacy:  N/A  Celene Squibb, PharmD Clinical Pharmacist 04/17/2023 3:12 PM

## 2023-04-17 NOTE — H&P (Addendum)
History and Physical    Rebecca Bridges WGN:[id number] DOB: 10-18-1975 DOA: 04/17/2023  PCP: [facility] (Confirm with patient/family/NH records and if not entered, this has to be entered at [facility] point of entry) Patient coming from: Home  I have personally briefly reviewed patient's old medical records in [facility]  Chief Complaint: Cough, SOB  HPI: Rebecca Bridges is a 47 y.o. female with medical history significant of ESRD on HD TTS, dilated cardiomyopathy with chronic combined HFrEF and HFpEF with LVEF <15% on echo 2023, nonobstructive CAD, COPD, cirrhosis requiring paracentesis, PE on Eliquis, HIV (CD4=80, Oct/2024) on antiviral treatment and dapsone, HCV presented with worsening of productive cough shortness of breath and left-sided chest pain.  Started to have shortness of breath and productive cough 2 days ago, with light yellowish phlegm, last night started to cough up phlegm mixed with streaks of blood and new onset of left-sided chest pain, sharp-like, worsening with cough and deep breath.  Denies any fever or chills.  Last dialysis was this Thursday.  EMS arrived and found patient febrile temperature 102, tachycardia heart rate 120-130s, O2 saturation 87% on room air and stabilized on 3 L.  ED Course: Tachycardia heart rate 130s, EKG showed chronic LBBB, blood pressure 113/81, O2 saturation 100% on 3 L.  Chest x-ray showed left-sided pneumonia and left-sided pleural effusion.  Blood work showed sodium 128, potassium 6.3, creatinine 6.1, BUN 35, WBC 12, hemoglobin 10.2 compared to baseline 10.9, platelet 127 compared to baseline 60.  Troponin 80 on first set  Patient was given IV fluid 1000 mL and ceftriaxone and azithromycin and morphine x 1.  Review of Systems: As per HPI otherwise 14 point review of systems negative.    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   a.) occluded anterior tibial, tibioperoneal trunk,  and posterior tibial arteries --> thromboembolectomy and PTA of the anterior tibial artery   CAD (coronary artery disease) 02/15/2019   a.) R/LHC 02/15/2019: mild (non-obstructive) diffuse LAD and LCx disease   COPD (chronic obstructive pulmonary disease) (HCC)    COVID    Dilated cardiomyopathy (HCC)    a.) TTE 11/27/2013: EF 20%; b.) TTE 03/15/2015: EF 20%; c.) TTE 12/09/2015: EF 45%; d.) TTE 12/22/2016: EF 35%; e.) TTE 08/08/2017: EF 45-50%; f.) TTE 01/27/2019: EF 20-25%; g.) TTE 01/13/2021: EF <15%; h.) TTE 11/21/2021: EF <15%   ESRD (end stage renal disease) on dialysis (HCC)    a.) T-Th-Sat   GERD (gastroesophageal reflux disease)    Hepatitis C virus    HFrEF (heart failure with reduced ejection fraction) (HCC)    a.) TTE 11/27/2013: EF 20%; b.) TTE 03/15/2015: EF 20%; c.) TTE 12/09/2015: EF 45%; d.) TTE 12/22/2016: EF 35%, G1DD; e.) TTE 08/08/2017: EF 45-50%, G1DD; f.) TTE 01/27/2019: EF 20-25%, G1DD; g.) R/LHC 02/15/2019: mPA 25, mPCWP 13, CO 4.84, CI 3.15; h.) TTE 01/13/2021: EF <15%, sev glob HK, sep/apical AK, mod asym LVH, sev LAE, mild RAE, G2DD; I.) TTE 11/21/2021: EF <15%, G2DD   History of noncompliance with medical treatment    HIV (human immunodeficiency virus infection) (HCC) 2015   a.) on bictegravir-emtricitabine-tenofovir (Biktarvy)   Hypertension    Insomnia    a.) on Trazodone PRN   LBBB (left bundle branch block)    Liver disease, chronic, with cirrhosis (HCC)    Long term current use of anticoagulant    a.) apixaban   Pancytopenia (HCC)    Prolonged Q-T  interval on ECG    Splenomegaly    Tobacco abuse     Past Surgical History:  Procedure Laterality Date   A/V FISTULAGRAM Left 03/31/2022   Procedure: A/V Fistulagram;  Surgeon: Renford Dills, MD;  Location: [facility] INVASIVE CV LAB;  Service: Cardiovascular;  Laterality: Left;   AV FISTULA PLACEMENT Left 02/12/2023   Procedure: LEFT ARM FISTULA CREATION;  Surgeon: Nada Libman, MD;  Location: [facility] OR;   Service: Vascular;  Laterality: Left;   CHOLECYSTECTOMY     LOWER EXTREMITY ANGIOGRAPHY Left 01/25/2019   Procedure: Lower Extremity Angiography;  Surgeon: Renford Dills, MD;  Location: [facility] INVASIVE CV LAB;  Service: Cardiovascular;  Laterality: Left;   RIGHT/LEFT HEART CATH AND CORONARY ANGIOGRAPHY Bilateral 02/15/2019   Procedure: RIGHT/LEFT HEART CATH AND CORONARY ANGIOGRAPHY;  Surgeon: Antonieta Iba, MD;  Location: [facility] INVASIVE CV LAB;  Service: Cardiovascular;  Laterality: Bilateral;     reports that she has been smoking cigarettes. She has never used smokeless tobacco. She reports that she does not drink alcohol and does not use drugs.  Allergies  Allergen Reactions   Neosporin [Bacitracin-Polymyxin B] Rash   Sulfa Antibiotics Rash    Family History  Problem Relation Age of Onset   Hypertension Mother    Cerebral aneurysm Mother      Prior to Admission medications   Medication Sig Start Date End Date Taking? Authorizing Provider  torsemide (DEMADEX) 20 MG tablet Take 2 tablets by mouth daily. 03/19/23  Yes [provider]  apixaban (ELIQUIS) 5 MG TABS tablet Take 5 mg by mouth 2 (two) times daily. 08/04/22   [provider]  B Complex-C-Folic Acid (RENA-VITE RX) 1 MG TABS Take 1 tablet by mouth Every Tuesday,Thursday,and Saturday with dialysis. 07/17/21   [provider]  bictegravir-emtricitabine-tenofovir AF (BIKTARVY) 50-200-25 MG TABS tablet Take 1 tablet by mouth at bedtime.    [provider]  calcium acetate (PHOSLO) 667 MG capsule Take 2,001 mg by mouth 3 (three) times daily. 04/22/21   [provider]  dapsone 25 MG tablet Take 25 mg by mouth 2 (two) times daily.    [provider]  furosemide (LASIX) 80 MG tablet Take 80 mg by mouth daily. 03/17/22   [provider]  gabapentin (NEURONTIN) 300 MG capsule Take 300 mg by mouth at bedtime. 03/12/22   [provider]  metoprolol succinate (TOPROL-XL)  25 MG 24 hr tablet Take 1 tablet by mouth daily. 12/30/22 12/30/23  [provider]  oxyCODONE-acetaminophen (PERCOCET) 5-325 MG tablet Take 1 tablet by mouth every 4 (four) hours as needed for severe pain. 02/12/23 02/12/24  Nada Libman, MD  pantoprazole (PROTONIX) 40 MG tablet Take 40 mg by mouth daily. 04/25/22   [provider]  prednisoLONE acetate (PRED FORTE) 1 % ophthalmic suspension Place 1 drop into the right eye daily as needed (Torn lens). 07/16/21   [provider]  traZODone (DESYREL) 50 MG tablet Take 50 mg by mouth at bedtime as needed for sleep. 03/24/22   [provider]    Physical Exam: Vitals:   04/17/23 1418 04/17/23 1420 04/17/23 1422  BP: 113/81 113/81   Pulse: (!) 136 (!) 133   Resp: (!) 34 (!) 39   Temp:   98.2 F (36.8 C)  TempSrc:   Axillary  SpO2: 100% 99%     Constitutional: NAD, calm, comfortable Vitals:   04/17/23 1418 04/17/23 1420 04/17/23 1422  BP: 113/81 113/81   Pulse: Marland Kitchen)  136 (!) 133   Resp: (!) 34 (!) 39   Temp:   98.2 F (36.8 C)  TempSrc:   Axillary  SpO2: 100% 99%    Acute sick looking Eyes: PERRL, lids and conjunctivae normal ENMT: Mucous membranes are moist. Posterior pharynx clear of any exudate or lesions.Normal dentition.  Neck: normal, supple, no masses, no thyromegaly.  JVD to the left side of jawline Respiratory: Diminished breathing sound on left lower field, no wheezing, no crackles.  Increasing breathing effort. No accessory muscle use.  Cardiovascular: Regular rate and rhythm, no murmurs / rubs / gallops. No extremity edema. 2+ pedal pulses. No carotid bruits.  Abdomen: no tenderness, no masses palpated. No hepatosplenomegaly. Bowel sounds positive.  Musculoskeletal: no clubbing / cyanosis. No joint deformity upper and lower extremities. Good ROM, no contractures. Normal muscle tone.  Skin: no rashes, lesions, ulcers. No induration Neurologic: CN 2-12 grossly intact. Sensation intact, DTR  normal. Strength 5/5 in all 4.  Psychiatric: Normal judgment and insight. Alert and oriented x 3. Normal mood.     Labs on Admission: I have personally reviewed following labs and imaging studies  CBC: Recent Labs  Lab 04/17/23 1423  WBC 12.2*  HGB 10.0*  HCT 32.3*  MCV 108.4*  PLT 127*   Basic Metabolic Panel: Recent Labs  Lab 04/17/23 1423  NA 128*  K 6.3*  CL 96*  CO2 21*  GLUCOSE 97  BUN 35*  CREATININE 6.13*  CALCIUM 8.2*   GFR: CrCl cannot be calculated (Unknown ideal weight.). Liver Function Tests: Recent Labs  Lab 04/17/23 1423  AST 33  ALT 19  ALKPHOS 79  BILITOT 1.7*  PROT 8.9*  ALBUMIN 2.8*   No results for input(s): "LIPASE", "AMYLASE" in the last 168 hours. No results for input(s): "AMMONIA" in the last 168 hours. Coagulation Profile: No results for input(s): "INR", "PROTIME" in the last 168 hours. Cardiac Enzymes: No results for input(s): "CKTOTAL", "CKMB", "CKMBINDEX", "TROPONINI" in the last 168 hours. BNP (last 3 results) No results for input(s): "PROBNP" in the last 8760 hours. HbA1C: No results for input(s): "HGBA1C" in the last 72 hours. CBG: No results for input(s): "GLUCAP" in the last 168 hours. Lipid Profile: No results for input(s): "CHOL", "HDL", "LDLCALC", "TRIG", "CHOLHDL", "LDLDIRECT" in the last 72 hours. Thyroid Function Tests: No results for input(s): "TSH", "T4TOTAL", "FREET4", "T3FREE", "THYROIDAB" in the last 72 hours. Anemia Panel: No results for input(s): "VITAMINB12", "FOLATE", "FERRITIN", "TIBC", "IRON", "RETICCTPCT" in the last 72 hours. Urine analysis:    Component Value Date/Time   COLORURINE YELLOW (A) 06/13/2019 1150   APPEARANCEUR HAZY (A) 06/13/2019 1150   LABSPEC 1.010 06/13/2019 1150   PHURINE 6.0 06/13/2019 1150   GLUCOSEU NEGATIVE 06/13/2019 1150   HGBUR NEGATIVE 06/13/2019 1150   BILIRUBINUR NEGATIVE 06/13/2019 1150   KETONESUR NEGATIVE 06/13/2019 1150   PROTEINUR >=300 (A) 06/13/2019 1150    NITRITE NEGATIVE 06/13/2019 1150   LEUKOCYTESUR SMALL (A) 06/13/2019 1150    Radiological Exams on Admission: DG Chest Port 1 View  Result Date: 04/17/2023 CLINICAL DATA:  Acute chest pain EXAM: PORTABLE CHEST 1 VIEW COMPARISON:  12/24/2022 FINDINGS: Heart is enlarged with central vascular congestion. Dense left mid and lower lung collapse/consolidation concerning for pneumonia. Suspect associated left effusion. Right lung remains clear. No pneumothorax. Trachea midline. Aorta atherosclerotic. Right IJ dialysis catheter tip SVC RA junction as before. IMPRESSION: 1. Cardiomegaly with central vascular congestion. 2. Dense left mid and lower lung collapse/consolidation concerning for pneumonia. Electronically Signed  By: Osvaldo Shipper M.D.   On: 04/17/2023 14:38    EKG: Independently reviewed.  Sinus tachycardia, chronic LBBB  Assessment/Plan Principal Problem:   Sepsis (HCC)  (please populate well all problems here in Problem List. (For example, if patient is on BP meds at home and you resume or decide to hold them, it is a problem that needs to be her. Same for CAD, COPD, HLD and so on)  Severe sepsis -Evidenced by new onset fever, tachycardia, with signs of acute organ damage of worsening of kidney function/AKI with a acute hyperkalemia, both of infection is left-sided pneumonia and rule out empyema -Continue ceftriaxone and azithromycin -CT chest without contrast to further characterize left-sided pneumonia and pleural effusion -Admit to stepdown unit for close monitoring  CAP, bacterial -Antibiotic coverage as above -CT chest -Culture sputum -DDx, CD4<200, patient reports has been compliant with dapsone.  Low suspicious for PCP  Hemoptysis -Suspect this is from CAP, treat CAP, then, reevaluate  Pleural chest pain -Clinically suspect new onset of pleural effusion, CT chest to further characterize, may need to consider thoracentesis. -Other DDx, history of nonobstructive CAD, low  suspicion for ACS.  On chronic Eliquis, low suspicion for PE.  Acute on chronic combined HFrEF and HFpEF decompensation -Has significant signs of fluid overload. -Emergency HD.  Case discussed with nephrology -1 dose of morphine and 1 dose of Lasix stat in ED  AKI on ESRD Hyperkalemia  Non-anion gap metabolic acidosis -One dose of  IV lasix -No significant EKG T wave changes, emergency HD   irrhosis -Appears to be stable, no significant ascites signs  Chronic pancytopenia -H&H stable, platelet level stable.  Hx of PE -Continue Eliquis   DVT prophylaxis: Eliquis Code Status: Full code Family Communication: None at bedside Disposition Plan: Patient is sick with sepsis, requiring IV antibiotics and significant CHF decompensation requiring emergency dialysis Consults called: Nephrology Admission status: Stepdown unit   Emeline General MD [facility] Pager [phone]  04/17/2023, 3:28 PM

## 2023-04-17 NOTE — Progress Notes (Signed)
Hemodialysis note  Received patient in bed to unit. Alert and oriented.  Informed consent signed and in chart.  Patient was complaining of severe chest pain when she came in and shortness of breath. RR was in 30's and labored. Hooked to oxygen via nasal cannula at 3L. Patient verbalized comfort after a few mins. Dr. Suezanne Jacquet was updated.  Patient has a side drip of antibiotics that was not infusing when she came in to the unit.  Treatment initiated: 1642 Treatment completed: 1946  Patient tolerated well. Transported back to room, alert without acute distress.  Report given to patient's RN.   Access used: Right Chest HD Catheter Access issues: none  Total UF removed: 2L Medication(s) given:  none  Post HD weight: 53 kg   [facility]

## 2023-04-17 NOTE — ED Triage Notes (Signed)
Pt to ED, [facility] from home Last dialysis was Thur no problem.   Since last night, central CP radiating to L with nausea  EMS gave 2 NTG, 324mg  chewable aspirin 22# R arm fistula L arm and R chest (currently using chest)  EMS VS: 23-24 etco2, T 102, 136 CBG, 136/74, HR 120, LBBB, RR 28 SPO2 was 87% RA, 98% on 3L  EKG already handed to EDP Isaacs  Code cart in room  [place] on the way

## 2023-04-17 NOTE — Progress Notes (Signed)
Patient arrived from ED. Placed on monitor. Patient states she has chest pain. EKG preformed and sent to Steward Drone, NP. Glucose obtained and patient was 47. D50 given X1 ampule. Recheck was 172. Patient on 2l/[place]arez and increased to 4L/[place] due to increased work of breathing. Steward Drone, NP made aware. Skin assessment preformed with Charge Erica. Sacrum foam placed for stage 1 on sacrum. CHB bath given. Gave PRN pain medication but held on oral medications due to patient condition. Patient is currently resting in bed with monitor on, side rails up X2, alert and oriented X4, call bell within reach. Will continue to monitor.

## 2023-04-17 NOTE — ED Notes (Signed)
2 sets cultures, lactic, blue top drawn at this time.

## 2023-04-18 DIAGNOSIS — R6520 Severe sepsis without septic shock: Secondary | ICD-10-CM | POA: Diagnosis not present

## 2023-04-18 DIAGNOSIS — A403 Sepsis due to Streptococcus pneumoniae: Secondary | ICD-10-CM | POA: Diagnosis not present

## 2023-04-18 DIAGNOSIS — J9601 Acute respiratory failure with hypoxia: Secondary | ICD-10-CM | POA: Diagnosis not present

## 2023-04-18 LAB — GLUCOSE, CAPILLARY
Glucose-Capillary: 117 mg/dL — ABNORMAL HIGH (ref 70–99)
Glucose-Capillary: 123 mg/dL — ABNORMAL HIGH (ref 70–99)
Glucose-Capillary: 68 mg/dL — ABNORMAL LOW (ref 70–99)
Glucose-Capillary: 83 mg/dL (ref 70–99)
Glucose-Capillary: 86 mg/dL (ref 70–99)
Glucose-Capillary: 94 mg/dL (ref 70–99)
Glucose-Capillary: 95 mg/dL (ref 70–99)
Glucose-Capillary: 96 mg/dL (ref 70–99)
Glucose-Capillary: 96 mg/dL (ref 70–99)

## 2023-04-18 LAB — BASIC METABOLIC PANEL
Anion gap: 8 (ref 5–15)
BUN: 21 mg/dL — ABNORMAL HIGH (ref 6–20)
CO2: 26 mmol/L (ref 22–32)
Calcium: 7.7 mg/dL — ABNORMAL LOW (ref 8.9–10.3)
Chloride: 94 mmol/L — ABNORMAL LOW (ref 98–111)
Creatinine, Ser: 4.11 mg/dL — ABNORMAL HIGH (ref 0.44–1.00)
GFR, Estimated: 13 mL/min — ABNORMAL LOW (ref >60)
Glucose, Bld: 119 mg/dL — ABNORMAL HIGH (ref 70–99)
Potassium: 4.3 mmol/L (ref 3.5–5.1)
Sodium: 128 mmol/L — ABNORMAL LOW (ref 135–145)

## 2023-04-18 LAB — BLOOD CULTURE ID PANEL (REFLEXED) - BCID2

## 2023-04-18 LAB — GLUCOSE, RANDOM: Glucose, Bld: 52 mg/dL — ABNORMAL LOW (ref 70–99)

## 2023-04-18 LAB — CBC
HCT: 30.2 % — ABNORMAL LOW (ref 36.0–46.0)
Hemoglobin: 9.7 g/dL — ABNORMAL LOW (ref 12.0–15.0)
MCH: 33.7 pg (ref 26.0–34.0)
MCHC: 32.1 g/dL (ref 30.0–36.0)
MCV: 104.9 fL — ABNORMAL HIGH (ref 80.0–100.0)
Platelets: 74 10*3/uL — ABNORMAL LOW (ref 150–400)
RBC: 2.88 MIL/uL — ABNORMAL LOW (ref 3.87–5.11)
RDW: 17.6 % — ABNORMAL HIGH (ref 11.5–15.5)
WBC: 10.1 10*3/uL (ref 4.0–10.5)
nRBC: 0 % (ref 0.0–0.2)

## 2023-04-18 LAB — CORTISOL-AM, BLOOD: Cortisol - AM: 18 ug/dL (ref 6.7–22.6)

## 2023-04-18 LAB — TROPONIN I (HIGH SENSITIVITY): Troponin I (High Sensitivity): 230 ng/L (ref <18)

## 2023-04-18 LAB — HEPATITIS B SURFACE ANTIGEN: Hepatitis B Surface Ag: NONREACTIVE

## 2023-04-18 LAB — TSH: TSH: 1.496 u[IU]/mL (ref 0.350–4.500)

## 2023-04-18 LAB — MRSA NEXT GEN BY PCR, NASAL: MRSA by PCR Next Gen: NOT DETECTED

## 2023-04-18 MED ORDER — GLUCOSE 40 % PO GEL
2.0000 | ORAL | Status: AC
  Administered 2023-04-18: 62 g via ORAL
  Filled 2023-04-18: qty 2

## 2023-04-18 MED ORDER — VANCOMYCIN HCL 500 MG/100ML IV SOLN: 500.0000 mg | INTRAVENOUS | Status: DC

## 2023-04-18 MED ORDER — DEXTROSE 50 % IV SOLN
INTRAVENOUS | Status: AC
  Filled 2023-04-18: qty 50

## 2023-04-18 MED ORDER — VANCOMYCIN HCL IN DEXTROSE 1-5 GM/200ML-% IV SOLN
1000.0000 mg | Freq: Once | INTRAVENOUS | Status: AC
  Administered 2023-04-18: 1000 mg via INTRAVENOUS
  Filled 2023-04-18: qty 200

## 2023-04-18 MED ORDER — ASPIRIN 325 MG PO TABS
325.0000 mg | ORAL_TABLET | Freq: Once | ORAL | Status: AC
  Administered 2023-04-18: 325 mg via ORAL
  Filled 2023-04-18: qty 1

## 2023-04-18 MED ORDER — DEXTROSE 10 % IV SOLN: INTRAVENOUS | Status: DC

## 2023-04-18 MED ORDER — DEXTROSE 50 % IV SOLN
12.5000 g | Freq: Once | INTRAVENOUS | Status: AC
  Administered 2023-04-18: 12.5 g via INTRAVENOUS

## 2023-04-18 NOTE — Hospital Course (Addendum)
HPI: Rebecca Bridges is a 47 y.o. female with medical history significant of ESRD on HD TTS, dilated cardiomyopathy with chronic combined HFrEF and HFpEF with LVEF <15% on echo 2023, nonobstructive CAD, COPD, cirrhosis requiring paracentesis, PE on Eliquis, HIV (CD4=80, Oct/2024) on antiviral treatment and dapsone, HCV presented with worsening of productive cough shortness of breath and left-sided chest pain. Onset 2d prior, progressed to productive cough w/ blood tinged mucus, L chest pain. EMS called. Febrile, tachycardic, SpO2 87% RA.   Hospital course / significant events:  11/02: to ED, CXR (+)L PNA and pleural effusion, admitted to hospitalist service for sepsis  11/03: (+)BCx strep pneumo. Echo ordered, likely will end up needing TEE, will continue abx and involve ID, chest pain PM and no EKG changes and troponin elevated but downtrending 11/04: ID consult pending, echo done and read is pending, BP stable, no further chest pain 11/05: echo - EF stable 20-25% severe hypokinesis/strain. Thoracentesis removed 600 mL 11/6.  Patient feeling better today but did have a bloody nose this morning and had low blood pressure 11/7.  Patient stated she had some black stools overnight.  Hemoglobin this morning 6.2.  A unit of blood ordered.  Benefits and risk explained to patient.  Eliquis stopped.  IV Protonix ordered.  CT scan of the abdomen did not show any retroperitoneal bleed.  Thoracentesis drew off another 800 mL as of bloody tinged fluid. 11/8.  Patient still having back pain.  Interested in a paracentesis.  Ultrasound paracentesis ordered.  Hemoglobin 7.8.  Consultants:  Infectious disease Advanced heart failure   Procedures/Surgeries: Thoracentesis on 11/5, 11/7.

## 2023-04-18 NOTE — Progress Notes (Signed)
PROGRESS NOTE    Rebecca Bridges   WUJ:[id number] DOB: 05-Mar-1976  DOA: 04/17/2023 Date of Service: 04/18/23 which is hospital day 1  PCP: [facility]    HPI: Rebecca Bridges is a 47 y.o. female with medical history significant of ESRD on HD TTS, dilated cardiomyopathy with chronic combined HFrEF and HFpEF with LVEF <15% on echo 2023, nonobstructive CAD, COPD, cirrhosis requiring paracentesis, PE on Eliquis, HIV (CD4=80, Oct/2024) on antiviral treatment and dapsone, HCV presented with worsening of productive cough shortness of breath and left-sided chest pain. Onset 2d prior, progressed to productive cough w/ blood tinged mucus, L chest pain. EMS called. Febrile, tachycardic, SpO2 87% RA.   Hospital course / significant events:  11/02: to ED, CXR (+)L PNA and pleural effusion, admitted to hospitalist service for sepsis  11/03: (+)BCx strep pneumo. Echo ordered, likely will end up needing TEE, will continue abx and involve ID   Consultants:  none  Procedures/Surgeries: none      ASSESSMENT & PLAN:   Severe sepsis d/t CAP, possible empyema  Strep pneumo bacteremia  fever, tachycardia, with signs of acute organ damage acute hyperkalemia, hypoxia DDx, CD4<200, patient reports has been compliant with dapsone.  Low suspicious for PCP Continue ceftriaxone and azithromycin Add vancomycin per ID  Culture sputum ID to follow   Hemoptysis - resolved  Suspect this is from CAP, treat as above Await CT   Pleuritic chest pain not c/w angina - resovled  Clinically suspect d/t  pleural effusion Other DDx, history of nonobstructive CAD, low suspicion for ACS.  On chronic Eliquis, low suspicion for PE. may need to consider thoracentesis.   Acute on chronic combined HFrEF and HFpEF decompensation Has significant signs of fluid overload. Emergency HD on admission, improved some after this 1 dose of morphine and 1 dose of Lasix in ED Last echo 11/2021, will repeat this admission     HIV Low CD4 count 03/16/23 Continue Biktarvy triple antiretroviral  CD4, HIV viral load  ID following  Continue Dapsone for PCP Ppx w/ low CD4 (sulfa allergy = no Bactrim)  ESRD Hyperkalemia No significant EKG T wave changes,  Non-anion gap metabolic acidosis One dose of  IV lasix emergency HD   Cirrhosis HCV Appears to be stable, no significant ascites signs monitor   Chronic pancytopenia H&H stable, platelet level stable. Monitor CBC   Hx of PE Continue Eliquis     Underweight and moderate protein calorie malnutrition based on BMI: Body mass index is 16.25 kg/m.  Underweight - under 18.5  normal weight - 18.5 to 24.9 overweight - 25 to 29.9 obese - 30 or more   DVT prophylaxis: ELiquis  IV fluids: no continuous IV fluids Nutrition: cardiac Central lines / invasive devices: non  Code Status: FULL CODE ACP documentation reviewed:  none on file in [facility]  TOC needs: none at this time Barriers to dispo / significant pending items: bacteremic, expect will be here some time on IV abx and need ID to see tomorrow              Subjective / Brief ROS:  Patient reports feeling better this morning, still tired  Denies CP/SOB.  Pain controlled.  Denies new weakness.  Tolerating diet.  Reports no concerns w/ urination/defecation.   Family Communication: pt declined call to family    Objective Findings:  Vitals:   04/18/23 1000 04/18/23 1100 04/18/23 1200 04/18/23 1300  BP: (!) 81/59 (!) 81/59 (!) 83/65 94/69  Pulse: 90  86 83 72  Resp: (!) 23 20 (!) 21 (!) 25  Temp:   98.3 F (36.8 C)   TempSrc:      SpO2: 100% 98% 100% 99%  Weight:      Height:        Intake/Output Summary (Last 24 hours) at 04/18/2023 1458 Last data filed at 04/18/2023 0900 Gross per 24 hour  Intake 997.43 ml  Output 2001 ml  Net -1003.57 ml   Filed Weights   04/17/23 1656 04/17/23 1946 04/17/23 2128  Weight: 55 kg 53 kg 41.6 kg    Examination:  Physical  Exam Constitutional:      General: She is not in acute distress. Cardiovascular:     Rate and Rhythm: Normal rate and regular rhythm.     Heart sounds: Heart sounds are distant.  Pulmonary:     Breath sounds: Normal breath sounds.  Abdominal:     Palpations: Abdomen is soft.  Neurological:     General: No focal deficit present.     Mental Status: She is alert and oriented to person, place, and time.  Psychiatric:        Mood and Affect: Mood normal.        Behavior: Behavior normal.          Scheduled Medications:   apixaban  5 mg Oral BID   bictegravir-emtricitabine-tenofovir AF  1 tablet Oral Daily   calcium acetate  2,001 mg Oral TID AC   Chlorhexidine Gluconate Cloth  6 each Topical Q0600   dapsone  50 mg Oral BID   gabapentin  300 mg Oral QHS   metoprolol succinate  25 mg Oral Daily   [START ON 04/25/2023] pantoprazole  40 mg Oral Daily   torsemide  40 mg Oral Daily    Continuous Infusions:  azithromycin Stopped (04/17/23 1608)   cefTRIAXone (ROCEPHIN)  IV Stopped (04/17/23 1608)   dextrose 30 mL/hr at 04/18/23 0600    PRN Medications:  HYDROmorphone (DILAUDID) injection, levalbuterol, metoCLOPramide (REGLAN) injection, oxyCODONE-acetaminophen, traZODone  Antimicrobials from admission:  Anti-infectives (From admission, onward)    Start     Dose/Rate Route Frequency Ordered Stop   04/18/23 1000  bictegravir-emtricitabine-tenofovir AF (BIKTARVY) 50-200-25 MG per tablet 1 tablet        1 tablet Oral Daily 04/17/23 1526     04/18/23 1000  dapsone tablet 50 mg        50 mg Oral 2 times daily 04/17/23 1526     04/17/23 1500  cefTRIAXone (ROCEPHIN) 2 g in sodium chloride 0.9 % 100 mL IVPB        2 g 200 mL/hr over 30 Minutes Intravenous Every 24 hours 04/17/23 1455 04/22/23 1459   04/17/23 1500  azithromycin (ZITHROMAX) 500 mg in dextrose 5 % 250 mL IVPB        500 mg 250 mL/hr over 60 Minutes Intravenous Every 24 hours 04/17/23 1455 04/22/23 1459            Data Reviewed:  I have personally reviewed the following...  CBC: Recent Labs  Lab 04/17/23 1423 04/18/23 0215  WBC 12.2* 10.1  HGB 10.0* 9.7*  HCT 32.3* 30.2*  MCV 108.4* 104.9*  PLT 127* 74*   Basic Metabolic Panel: Recent Labs  Lab 04/17/23 1423 04/17/23 2207 04/18/23 0032 04/18/23 0215  NA 128* 129*  --  128*  K 6.3* 3.3*  --  4.3  CL 96* 94*  --  94*  CO2 21* 26  --  26  GLUCOSE 97 125* 52* 119*  BUN 35* 18  --  21*  CREATININE 6.13* 3.65*  --  4.11*  CALCIUM 8.2* 7.6*  --  7.7*   GFR: Estimated Creatinine Clearance: 11.2 mL/min (A) (by C-G formula based on SCr of 4.11 mg/dL (H)). Liver Function Tests: Recent Labs  Lab 04/17/23 1423 04/17/23 2207  AST 33 37  ALT 19 16  ALKPHOS 79 68  BILITOT 1.7* 1.7*  PROT 8.9* 8.1  ALBUMIN 2.8* 2.5*   No results for input(s): "LIPASE", "AMYLASE" in the last 168 hours. No results for input(s): "AMMONIA" in the last 168 hours. Coagulation Profile: No results for input(s): "INR", "PROTIME" in the last 168 hours. Cardiac Enzymes: No results for input(s): "CKTOTAL", "CKMB", "CKMBINDEX", "TROPONINI" in the last 168 hours. BNP (last 3 results) No results for input(s): "PROBNP" in the last 8760 hours. HbA1C: No results for input(s): "HGBA1C" in the last 72 hours. CBG: Recent Labs  Lab 04/18/23 0051 04/18/23 0312 04/18/23 0601 04/18/23 0800 04/18/23 1149  GLUCAP 68* 117* 86 83 96   Lipid Profile: No results for input(s): "CHOL", "HDL", "LDLCALC", "TRIG", "CHOLHDL", "LDLDIRECT" in the last 72 hours. Thyroid Function Tests: Recent Labs    04/18/23 0215  TSH 1.496   Anemia Panel: No results for input(s): "VITAMINB12", "FOLATE", "FERRITIN", "TIBC", "IRON", "RETICCTPCT" in the last 72 hours. Most Recent Urinalysis On File:     Component Value Date/Time   COLORURINE YELLOW (A) 06/13/2019 1150   APPEARANCEUR HAZY (A) 06/13/2019 1150   LABSPEC 1.010 06/13/2019 1150   PHURINE 6.0 06/13/2019 1150    GLUCOSEU NEGATIVE 06/13/2019 1150   HGBUR NEGATIVE 06/13/2019 1150   BILIRUBINUR NEGATIVE 06/13/2019 1150   KETONESUR NEGATIVE 06/13/2019 1150   PROTEINUR >=300 (A) 06/13/2019 1150   NITRITE NEGATIVE 06/13/2019 1150   LEUKOCYTESUR SMALL (A) 06/13/2019 1150   Sepsis Labs: @LABRCNTIP (procalcitonin:4,lacticidven:4) Microbiology: Recent Results (from the past 240 hour(s))  Blood culture (routine x 2)     Status: None (Preliminary result)   Collection Time: 04/17/23  2:20 PM   Specimen: BLOOD  Result Value Ref Range Status   Specimen Description   Final    BLOOD BLOOD RIGHT ARM Performed at [facility], [address] [place], [place] [address]    Special Requests   Final    BOTTLES DRAWN AEROBIC AND ANAEROBIC Blood Culture results may not be optimal due to an excessive volume of blood received in culture bottles Performed at [facility], [address] [place], [place] [address]    Culture  Setup Time   Final    GRAM POSITIVE COCCI IN BOTH AEROBIC AND ANAEROBIC BOTTLES STREPTOCOCCUS PNEUMONIAE CRITICAL RESULT CALLED TO, READ BACK BY AND VERIFIED WITH: Regino Bellow RN @ 0400 04/18/23 [facility]    Culture GRAM POSITIVE COCCI  Final   Report Status PENDING  Incomplete  Blood Culture ID Panel (Reflexed)     Status: Abnormal   Collection Time: 04/17/23  2:20 PM  Result Value Ref Range Status   Enterococcus faecalis NOT DETECTED NOT DETECTED Final   Enterococcus Faecium NOT DETECTED NOT DETECTED Final   Listeria monocytogenes NOT DETECTED NOT DETECTED Final   Staphylococcus species NOT DETECTED NOT DETECTED Final   Staphylococcus aureus (BCID) NOT DETECTED NOT DETECTED Final   Staphylococcus epidermidis NOT DETECTED NOT DETECTED Final   Staphylococcus lugdunensis NOT DETECTED NOT DETECTED Final   Streptococcus species DETECTED (A) NOT DETECTED Final    Comment: CRITICAL RESULT CALLED TO, READ BACK  BY AND VERIFIED WITH: NATHAN BELEU @ 0400 04/18/23 [facility]     Streptococcus agalactiae NOT DETECTED NOT DETECTED Final   Streptococcus pneumoniae DETECTED (A) NOT DETECTED Final    Comment: CRITICAL RESULT CALLED TO, READ BACK BY AND VERIFIED WITH: NATHAN BELEU @ 0400 04/18/23 [facility]    Streptococcus pyogenes NOT DETECTED NOT DETECTED Final   A.calcoaceticus-baumannii NOT DETECTED NOT DETECTED Final   Bacteroides fragilis NOT DETECTED NOT DETECTED Final   Enterobacterales NOT DETECTED NOT DETECTED Final   Enterobacter cloacae complex NOT DETECTED NOT DETECTED Final   Escherichia coli NOT DETECTED NOT DETECTED Final   Klebsiella aerogenes NOT DETECTED NOT DETECTED Final   Klebsiella oxytoca NOT DETECTED NOT DETECTED Final   Klebsiella pneumoniae NOT DETECTED NOT DETECTED Final   Proteus species NOT DETECTED NOT DETECTED Final   Salmonella species NOT DETECTED NOT DETECTED Final   Serratia marcescens NOT DETECTED NOT DETECTED Final   Haemophilus influenzae NOT DETECTED NOT DETECTED Final   Neisseria meningitidis NOT DETECTED NOT DETECTED Final   Pseudomonas aeruginosa NOT DETECTED NOT DETECTED Final   Stenotrophomonas maltophilia NOT DETECTED NOT DETECTED Final   Candida albicans NOT DETECTED NOT DETECTED Final   Candida auris NOT DETECTED NOT DETECTED Final   Candida glabrata NOT DETECTED NOT DETECTED Final   Candida krusei NOT DETECTED NOT DETECTED Final   Candida parapsilosis NOT DETECTED NOT DETECTED Final   Candida tropicalis NOT DETECTED NOT DETECTED Final   Cryptococcus neoformans/gattii NOT DETECTED NOT DETECTED Final    Comment: Performed at [facility], [address] [place], [place] [address]  Blood culture (routine x 2)     Status: None (Preliminary result)   Collection Time: 04/17/23  2:23 PM   Specimen: BLOOD  Result Value Ref Range Status   Specimen Description   Final    BLOOD BLOOD RIGHT HAND Performed at [facility], [address] [place], [place] [address]    Special Requests   Final    BOTTLES DRAWN  AEROBIC AND ANAEROBIC Blood Culture results may not be optimal due to an excessive volume of blood received in culture bottles Performed at [facility], [address] [place], [place] [address]    Culture  Setup Time   Final    IN BOTH AEROBIC AND ANAEROBIC BOTTLES GRAM POSITIVE COCCI STREPTOCOCCUS PNEUMONIAE CRITICAL RESULT CALLED TO, READ BACK BY AND VERIFIED WITH: NATHAN BELEU @ 0400 04/18/23 [facility]    Culture GRAM POSITIVE COCCI  Final   Report Status PENDING  Incomplete  MRSA Next Gen by PCR, Nasal     Status: None   Collection Time: 04/18/23  9:39 AM   Specimen: Nasal Mucosa; Nasal Swab  Result Value Ref Range Status   MRSA by PCR Next Gen NOT DETECTED NOT DETECTED Final    Comment: (NOTE) The GeneXpert MRSA Assay (FDA approved for NASAL specimens only), is one component of a comprehensive MRSA colonization surveillance program. It is not intended to diagnose MRSA infection nor to guide or monitor treatment for MRSA infections. Test performance is not FDA approved in patients less than 27 years old. Performed at [facility], [address] [place], [place] [address]       Radiology Studies last 3 days: CT CHEST WO CONTRAST  Result Date: 04/17/2023 CLINICAL DATA:  Pneumonia, complication suspected, xray done EXAM: CT CHEST WITHOUT CONTRAST TECHNIQUE: Multidetector CT imaging of the chest was performed following the standard protocol without IV contrast. RADIATION DOSE REDUCTION: This exam  was performed according to the departmental dose-optimization program which includes automated exposure control, adjustment of the mA and/or kV according to patient size and/or use of iterative reconstruction technique. COMPARISON:  01/17/2022 FINDINGS: Cardiovascular: Extensive multi-vessel coronary artery calcification. Moderate cardiomegaly is again identified with biventricular enlargement. No pericardial effusion. The central pulmonary arteries are enlarged in keeping  with changes of pulmonary arterial hypertension. Mild atherosclerotic calcification within the thoracic aorta. No aortic aneurysm. Right internal jugular hemodialysis catheter tip noted within the right atrium. Mediastinum/Nodes: Visualized thyroid is unremarkable. Shotty mediastinal adenopathy, predominantly within the left prevascular and right paratracheal region is nonspecific, possibly reactive in nature. This appears progressive since prior examination. The esophagus is unremarkable. Lungs/Pleura: Moderate left pleural effusion is present with compressive atelectasis of the left lung base, slightly enlarged since prior examination. There is, however, superimposed dense consolidation within the left lower lobe and basilar lingula in keeping with changes of acute lobar pneumonia in the appropriate clinical setting. There is chronic consolidation within the lingula medially, stable since prior examination, likely post inflammatory in nature. Stable 4 mm subpleural pulmonary nodule within the right upper lobe, axial image # 70/4, safely considered benign. No pneumothorax or pleural effusion on the right. There is extensive airway impaction within the left lower lobe and basilar lingula. No central obstructing mass. Upper Abdomen: At least moderate ascites noted within the visualized upper abdomen. Stable splenomegaly, incompletely evaluated on this examination. Musculoskeletal: No acute bone abnormality. No lytic or blastic bone lesion. IMPRESSION: 1. Dense consolidation within the left lower lobe and basilar lingula in keeping with changes of acute lobar pneumonia in the appropriate clinical setting. Extensive airway impaction within the left lower lobe and basilar lingula. No central obstructing mass. 2. Moderate left pleural effusion with compressive atelectasis of the left lung base, slightly enlarged since prior examination. 3. Extensive multi-vessel coronary artery calcification. Moderate cardiomegaly with  biventricular enlargement. 4. Morphologic changes in keeping with pulmonary arterial hypertension. 5. At least moderate ascites within the visualized upper abdomen. Stable splenomegaly. Together, these findings may reflect the sequela of portal venous hypertension, though this is incompletely evaluated on this exam. Aortic Atherosclerosis (ICD10-I70.0). Electronically Signed   By: Helyn Numbers M.D.   On: 04/17/2023 21:26   DG Chest Port 1 View  Result Date: 04/17/2023 CLINICAL DATA:  Acute chest pain EXAM: PORTABLE CHEST 1 VIEW COMPARISON:  12/24/2022 FINDINGS: Heart is enlarged with central vascular congestion. Dense left mid and lower lung collapse/consolidation concerning for pneumonia. Suspect associated left effusion. Right lung remains clear. No pneumothorax. Trachea midline. Aorta atherosclerotic. Right IJ dialysis catheter tip SVC RA junction as before. IMPRESSION: 1. Cardiomegaly with central vascular congestion. 2. Dense left mid and lower lung collapse/consolidation concerning for pneumonia. Electronically Signed   By: Judie Petit.  Shick M.D.   On: 04/17/2023 14:38       Time spent: 50 min    Sunnie Nielsen, DO [facility] 04/18/2023, 2:58 PM    Dictation software may have been used to generate the above note. Typos may occur and escape review in typed/dictated notes. Please contact Dr Lyn Hollingshead directly for clarity if needed.  Staff may message me via secure chat in Epic  but this may not receive an immediate response,  please page me for urgent matters!  If 7PM-7AM, please contact night coverage [email or web address]

## 2023-04-18 NOTE — Progress Notes (Signed)
Responded to RN message re: CP/SOB. Pt has also been c/o persistent blood tinged mucus productive cough. Pt concerned the azithromycin is the cause of symptoms.   Reviewed recent results.   A/P   Cardiovascular: Suspect symptoms d/t demand ischemia CHF. BP borderline, but MAP >60. Echo report still pending but hx severely reduced EF, concern for fluid overload / low output HF. Low threshold for initiating milrinone or norepi if BP further drops - cancelled transfer to progressive and can stay in stepdown for now. Would avoid nitroglycerin for chest pain, morphine prn for now. Cannot reasonably diurese given ESRD, will need to await dialysis. Follow troponin which will be expected to be high but trend may be helpful. EKG obtained and no changes, LBBB.   Pulmonary: pneumonia and associated hemoptysis, would use caution w/ heparin. Follow CBC. Pleural effusion also likely contributing to SOB.  RN reports pain and SOB resolved spontaneously. Continue close monitoring and will signout to night team.

## 2023-04-18 NOTE — Progress Notes (Signed)
Notified Dr. Lyn Hollingshead that patient was c/o shortness of breath. Breathing treatment given and shortness of breath resolved and then patient started c/o chest pain with shortness of breath. Chest pain and shortness of breath have now resolved. MD acknowledged. Notfied MD that patient c/o this same thing happened last night after antibiotic was started. Patient asked RN to stop antibiotic that was infusing, azithromycin was finished by this time. MD acknowledged all listed above and no orders given at this time.

## 2023-04-18 NOTE — Progress Notes (Signed)
Dr. Lyn Hollingshead gave order for troponin and EKG.

## 2023-04-18 NOTE — Progress Notes (Signed)
       CROSS COVER NOTE  NAME: Rebecca Bridges MRN: [id number] DOB : [birth year]    Concern as stated by nurse / staff    Recurrent hypoglycemia 4/4 BCID strept pneumoniae    Pertinent findings on chart review:   Assessment and  Interventions   Assessment:    04/18/2023    6:00 AM 04/18/2023    5:00 AM 04/18/2023    4:00 AM  Vitals with BMI  Systolic 89 94 87  Diastolic 70 71 76  Pulse 111 115 55    Plan: Cbg readings validated with venous glucose Started low volume D10 continuous fluids No change in antibiotics needed. Remains on appropriate rocephin and azithromycin        Donnie Mesa NP [facility] Cross Cover 7pm-7am - check amion for availability Pager [phone]

## 2023-04-18 NOTE — Progress Notes (Signed)
Capillary glucose spot check resulted in 41 for this patient. This RN gave 8 oz of juice and contacted Manuela Schwartz, NP for further orders. Np ordered glucose gel to be given and for a random glucose to be drawn to validate results. Lab drawn and rechecked after gel and juice was given and results were 52 on lab and 68 on finger stick. Steward Drone, NP ordered 1/2 amp of D50 to be given and D10 infusion to be started. Ordered glucose finger sticks to be hourly for four hours and then every two hours afterwards. Orders carried out. Patient remains alert and oriented X4 the entire time. Vital signs stable, call bell within reach, side rails up X2, will continue to monitor.

## 2023-04-18 NOTE — Consult Note (Signed)
Pharmacy Antibiotic Note  Rebecca Bridges is a 47 y.o. female admitted on 04/17/2023 with sepsis.  Pharmacy has been consulted for vancomycin dosing.  ESRD on HD (TTS)  Plan: Give vancomycin 1000 mg IV x 1, then start schedule of vancomycin 500 mg TTS with HD. Vancomycin levels as clinically indicated Ceftriaxone 2 grams IV every 24 hours per provider Azithromycin 500 mg IV every 24 hours per provider Follow culture results and levels for adjustments  Height: 5\' 3"  (160 cm) Weight: 41.6 kg (91 lb 11.4 oz) IBW/kg (Calculated) : 52.4  Temp (24hrs), Avg:99.4 F (37.4 C), Min:98.3 F (36.8 C), Max:100.8 F (38.2 C)  Recent Labs  Lab 04/17/23 1420 04/17/23 1423 04/17/23 2207 04/18/23 0215  WBC  --  12.2*  --  10.1  CREATININE  --  6.13* 3.65* 4.11*  LATICACIDVEN 3.3*  --  2.7*  --     Estimated Creatinine Clearance: 11.2 mL/min (A) (by C-G formula based on SCr of 4.11 mg/dL (H)).    Allergies  Allergen Reactions   Neosporin [Bacitracin-Polymyxin B] Rash   Sulfa Antibiotics Rash    Antimicrobials this admission: vancomycin 11/3 >>  azithromycin 11/2 >>  Ceftriaxone 11/2 >>  Dose adjustments this admission: N/A  Microbiology results: 11/2 BCx: 4/4 GPC, BCID = Strep Pneumoniae 11/2 Sputum: ordered 11/3 MRSA PCR: not detected  Thank you for allowing pharmacy to be a part of this patient's care.  Barrie Folk, PharmD 04/18/2023 3:02 PM

## 2023-04-18 NOTE — Progress Notes (Signed)
PHARMACY - PHYSICIAN COMMUNICATION CRITICAL VALUE ALERT - BLOOD CULTURE IDENTIFICATION (BCID)  Results for orders placed or performed during the hospital encounter of 04/17/23  Blood culture (routine x 2)     Status: None (Preliminary result)   Collection Time: 04/17/23  2:20 PM   Specimen: BLOOD  Result Value Ref Range Status   Specimen Description BLOOD BLOOD RIGHT ARM  Final   Special Requests   Final    BOTTLES DRAWN AEROBIC AND ANAEROBIC Blood Culture results may not be optimal due to an excessive volume of blood received in culture bottles   Culture  Setup Time   Final    GRAM POSITIVE COCCI IN BOTH AEROBIC AND ANAEROBIC BOTTLES Organism ID to follow STREPTOCOCCUS PNEUMONIAE CRITICAL RESULT CALLED TO, READ BACK BY AND VERIFIED WITH: Regino Bellow RN @ 0400 04/18/23 [facility] Performed at [facility], [address] [place], [place] [address]    Culture GRAM POSITIVE COCCI  Final   Report Status PENDING  Incomplete  Blood Culture ID Panel (Reflexed)     Status: Abnormal   Collection Time: 04/17/23  2:20 PM  Result Value Ref Range Status   Enterococcus faecalis NOT DETECTED NOT DETECTED Final   Enterococcus Faecium NOT DETECTED NOT DETECTED Final   Listeria monocytogenes NOT DETECTED NOT DETECTED Final   Staphylococcus species NOT DETECTED NOT DETECTED Final   Staphylococcus aureus (BCID) NOT DETECTED NOT DETECTED Final   Staphylococcus epidermidis NOT DETECTED NOT DETECTED Final   Staphylococcus lugdunensis NOT DETECTED NOT DETECTED Final   Streptococcus species DETECTED (A) NOT DETECTED Final    Comment: CRITICAL RESULT CALLED TO, READ BACK BY AND VERIFIED WITH: Iden Stripling BELEU @ 0400 04/18/23 [facility]    Streptococcus agalactiae NOT DETECTED NOT DETECTED Final   Streptococcus pneumoniae DETECTED (A) NOT DETECTED Final    Comment: CRITICAL RESULT CALLED TO, READ BACK BY AND VERIFIED WITH: Ladarryl Wrage BELEU @ 0400 04/18/23 [facility]    Streptococcus pyogenes NOT DETECTED NOT DETECTED  Final   A.calcoaceticus-baumannii NOT DETECTED NOT DETECTED Final   Bacteroides fragilis NOT DETECTED NOT DETECTED Final   Enterobacterales NOT DETECTED NOT DETECTED Final   Enterobacter cloacae complex NOT DETECTED NOT DETECTED Final   Escherichia coli NOT DETECTED NOT DETECTED Final   Klebsiella aerogenes NOT DETECTED NOT DETECTED Final   Klebsiella oxytoca NOT DETECTED NOT DETECTED Final   Klebsiella pneumoniae NOT DETECTED NOT DETECTED Final   Proteus species NOT DETECTED NOT DETECTED Final   Salmonella species NOT DETECTED NOT DETECTED Final   Serratia marcescens NOT DETECTED NOT DETECTED Final   Haemophilus influenzae NOT DETECTED NOT DETECTED Final   Neisseria meningitidis NOT DETECTED NOT DETECTED Final   Pseudomonas aeruginosa NOT DETECTED NOT DETECTED Final   Stenotrophomonas maltophilia NOT DETECTED NOT DETECTED Final   Candida albicans NOT DETECTED NOT DETECTED Final   Candida auris NOT DETECTED NOT DETECTED Final   Candida glabrata NOT DETECTED NOT DETECTED Final   Candida krusei NOT DETECTED NOT DETECTED Final   Candida parapsilosis NOT DETECTED NOT DETECTED Final   Candida tropicalis NOT DETECTED NOT DETECTED Final   Cryptococcus neoformans/gattii NOT DETECTED NOT DETECTED Final    Comment: Performed at [facility], [address] [place], [place] [address]  Blood culture (routine x 2)     Status: None (Preliminary result)   Collection Time: 04/17/23  2:23 PM   Specimen: BLOOD  Result Value Ref Range Status   Specimen Description BLOOD BLOOD RIGHT HAND  Final   Special Requests  Final    BOTTLES DRAWN AEROBIC AND ANAEROBIC Blood Culture results may not be optimal due to an excessive volume of blood received in culture bottles   Culture  Setup Time   Final    IN BOTH AEROBIC AND ANAEROBIC BOTTLES GRAM POSITIVE COCCI STREPTOCOCCUS PNEUMONIAE CRITICAL RESULT CALLED TO, READ BACK BY AND VERIFIED WITH: Maevis Mumby BELEU @ 0400 04/18/23 [facility] Performed at [facility], [address] [place], [place] [address]    Culture GRAM POSITIVE COCCI  Final   Report Status PENDING  Incomplete    BCID Results: 4 of 4 bottles with Strep pneumoniae.  Pt already on Ceftriaxone 2 gm, along with Azithromycin.  Name of provider contacted: Cliffton Asters, NP   Changes to prescribed antibiotics required: No changes at this time.  Otelia Sergeant, PharmD, [facility] 04/18/2023 4:01 AM

## 2023-04-18 NOTE — Progress Notes (Signed)
[facility]  PROGRESS NOTE   Subjective:   Patient seen at bedside.  More awake today.  Blood pressure is marginal. Had stable dialysis yesterday.  Potassium levels have improved.  Objective:  Vital signs: Blood pressure (!) 81/59, pulse 86, temperature 99.9 F (37.7 C), temperature source Oral, resp. rate 20, height 5\' 3"  (1.6 m), weight 41.6 kg, last menstrual period 05/09/2019, SpO2 98%.  Intake/Output Summary (Last 24 hours) at 04/18/2023 1319 Last data filed at 04/18/2023 0900 Gross per 24 hour  Intake 997.43 ml  Output 2001 ml  Net -1003.57 ml   Filed Weights   04/17/23 1656 04/17/23 1946 04/17/23 2128  Weight: 55 kg 53 kg 41.6 kg     Physical Exam: General:  No acute distress  Head:  Normocephalic, atraumatic. Moist oral mucosal membranes  Eyes:  Anicteric  Neck:  Supple  Lungs:   Clear to auscultation, normal effort  Heart:  S1S2 no rubs  Abdomen:   Soft, nontender, bowel sounds present  Extremities:  peripheral edema.  Neurologic:  Awake, alert, following commands  Skin:  No lesions  Access:     Basic Metabolic Panel: Recent Labs  Lab 04/17/23 1423 04/17/23 2207 04/18/23 0032 04/18/23 0215  NA 128* 129*  --  128*  K 6.3* 3.3*  --  4.3  CL 96* 94*  --  94*  CO2 21* 26  --  26  GLUCOSE 97 125* 52* 119*  BUN 35* 18  --  21*  CREATININE 6.13* 3.65*  --  4.11*  CALCIUM 8.2* 7.6*  --  7.7*   GFR: Estimated Creatinine Clearance: 11.2 mL/min (A) (by C-G formula based on SCr of 4.11 mg/dL (H)).  Liver Function Tests: Recent Labs  Lab 04/17/23 1423 04/17/23 2207  AST 33 37  ALT 19 16  ALKPHOS 79 68  BILITOT 1.7* 1.7*  PROT 8.9* 8.1  ALBUMIN 2.8* 2.5*   No results for input(s): "LIPASE", "AMYLASE" in the last 168 hours. No results for input(s): "AMMONIA" in the last 168 hours.  CBC: Recent Labs  Lab 04/17/23 1423 04/18/23 0215  WBC 12.2* 10.1  HGB 10.0* 9.7*  HCT 32.3* 30.2*  MCV 108.4* 104.9*  PLT 127* 74*      HbA1C: Hgb A1c MFr Bld  Date/Time Value Ref Range Status  01/26/2019 04:49 PM 5.5 4.8 - 5.6 % Final    Comment:    (NOTE) Pre diabetes:          5.7%-6.4% Diabetes:              >6.4% Glycemic control for   <7.0% adults with diabetes     Urinalysis: No results for input(s): "COLORURINE", "LABSPEC", "PHURINE", "GLUCOSEU", "HGBUR", "BILIRUBINUR", "KETONESUR", "PROTEINUR", "UROBILINOGEN", "NITRITE", "LEUKOCYTESUR" in the last 72 hours.  Invalid input(s): "APPERANCEUR"    Imaging: CT CHEST WO CONTRAST  Result Date: 04/17/2023 CLINICAL DATA:  Pneumonia, complication suspected, xray done EXAM: CT CHEST WITHOUT CONTRAST TECHNIQUE: Multidetector CT imaging of the chest was performed following the standard protocol without IV contrast. RADIATION DOSE REDUCTION: This exam was performed according to the departmental dose-optimization program which includes automated exposure control, adjustment of the mA and/or kV according to patient size and/or use of iterative reconstruction technique. COMPARISON:  01/17/2022 FINDINGS: Cardiovascular: Extensive multi-vessel coronary artery calcification. Moderate cardiomegaly is again identified with biventricular enlargement. No pericardial effusion. The central pulmonary arteries are enlarged in keeping with changes of pulmonary arterial hypertension. Mild atherosclerotic calcification within the thoracic aorta. No aortic aneurysm. Right  internal jugular hemodialysis catheter tip noted within the right atrium. Mediastinum/Nodes: Visualized thyroid is unremarkable. Shotty mediastinal adenopathy, predominantly within the left prevascular and right paratracheal region is nonspecific, possibly reactive in nature. This appears progressive since prior examination. The esophagus is unremarkable. Lungs/Pleura: Moderate left pleural effusion is present with compressive atelectasis of the left lung base, slightly enlarged since prior examination. There is, however,  superimposed dense consolidation within the left lower lobe and basilar lingula in keeping with changes of acute lobar pneumonia in the appropriate clinical setting. There is chronic consolidation within the lingula medially, stable since prior examination, likely post inflammatory in nature. Stable 4 mm subpleural pulmonary nodule within the right upper lobe, axial image # 70/4, safely considered benign. No pneumothorax or pleural effusion on the right. There is extensive airway impaction within the left lower lobe and basilar lingula. No central obstructing mass. Upper Abdomen: At least moderate ascites noted within the visualized upper abdomen. Stable splenomegaly, incompletely evaluated on this examination. Musculoskeletal: No acute bone abnormality. No lytic or blastic bone lesion. IMPRESSION: 1. Dense consolidation within the left lower lobe and basilar lingula in keeping with changes of acute lobar pneumonia in the appropriate clinical setting. Extensive airway impaction within the left lower lobe and basilar lingula. No central obstructing mass. 2. Moderate left pleural effusion with compressive atelectasis of the left lung base, slightly enlarged since prior examination. 3. Extensive multi-vessel coronary artery calcification. Moderate cardiomegaly with biventricular enlargement. 4. Morphologic changes in keeping with pulmonary arterial hypertension. 5. At least moderate ascites within the visualized upper abdomen. Stable splenomegaly. Together, these findings may reflect the sequela of portal venous hypertension, though this is incompletely evaluated on this exam. Aortic Atherosclerosis (ICD10-I70.0). Electronically Signed   By: Helyn Numbers M.D.   On: 04/17/2023 21:26   DG Chest Port 1 View  Result Date: 04/17/2023 CLINICAL DATA:  Acute chest pain EXAM: PORTABLE CHEST 1 VIEW COMPARISON:  12/24/2022 FINDINGS: Heart is enlarged with central vascular congestion. Dense left mid and lower lung  collapse/consolidation concerning for pneumonia. Suspect associated left effusion. Right lung remains clear. No pneumothorax. Trachea midline. Aorta atherosclerotic. Right IJ dialysis catheter tip SVC RA junction as before. IMPRESSION: 1. Cardiomegaly with central vascular congestion. 2. Dense left mid and lower lung collapse/consolidation concerning for pneumonia. Electronically Signed   By: Judie Petit.  Shick M.D.   On: 04/17/2023 14:38     Medications:    azithromycin Stopped (04/17/23 1608)   cefTRIAXone (ROCEPHIN)  IV Stopped (04/17/23 1608)   dextrose 30 mL/hr at 04/18/23 0600    apixaban  5 mg Oral BID   bictegravir-emtricitabine-tenofovir AF  1 tablet Oral Daily   calcium acetate  2,001 mg Oral TID AC   Chlorhexidine Gluconate Cloth  6 each Topical Q0600   dapsone  50 mg Oral BID   gabapentin  300 mg Oral QHS   metoprolol succinate  25 mg Oral Daily   [START ON 04/25/2023] pantoprazole  40 mg Oral Daily   torsemide  40 mg Oral Daily    Assessment/ Plan:     47 year old female with history of hypertension, coronary artery disease, congestive heart failure, HIV, cirrhosis of the liver, end-stage renal disease on dialysis now admitted with history of shortness of breath, cough, fever. She is found to have fluid overload and also possible pneumonia. She is on a Tuesday Thursday Saturday schedule for dialysis.   #1: ESRD on hemodialysis: Patient had urgent dialysis yesterday with 1K bath.  Potassium levels have improved.  Continue torsemide on as needed basis for fluid overload.  Next dialysis will be on Tuesday.  #2: Sepsis/community-acquired pneumonia: Patient is presently on azithromycin and Rocephin.  #3: Hypotension: Will hold antihypertensive medications.  #4: Secondary hyperparathyroidism: Will check PTH, calcium and phosphorus levels.  Continue calcium acetate.  #5: Anemia: Will continue to follow anemia protocols with dialysis.  Labs and medications reviewed. Will continue to  follow along with you.   LOS: 1 Lorain Childes, MD [facility] 11/3/20241:19 PM

## 2023-04-19 ENCOUNTER — Inpatient Hospital Stay (HOSPITAL_COMMUNITY)
Admit: 2023-04-19 | Discharge: 2023-04-19 | Disposition: A | Discharge status: Home / Self Care | Payer: 59 | Attending: Osteopathic Medicine

## 2023-04-19 DIAGNOSIS — I5043 Acute on chronic combined systolic (congestive) and diastolic (congestive) heart failure: Secondary | ICD-10-CM

## 2023-04-19 DIAGNOSIS — R7881 Bacteremia: Secondary | ICD-10-CM

## 2023-04-19 DIAGNOSIS — B20 Human immunodeficiency virus [HIV] disease: Secondary | ICD-10-CM

## 2023-04-19 DIAGNOSIS — I420 Dilated cardiomyopathy: Secondary | ICD-10-CM | POA: Diagnosis not present

## 2023-04-19 DIAGNOSIS — R6520 Severe sepsis without septic shock: Secondary | ICD-10-CM | POA: Diagnosis not present

## 2023-04-19 DIAGNOSIS — A403 Sepsis due to Streptococcus pneumoniae: Secondary | ICD-10-CM | POA: Diagnosis not present

## 2023-04-19 DIAGNOSIS — J90 Pleural effusion, not elsewhere classified: Secondary | ICD-10-CM | POA: Diagnosis not present

## 2023-04-19 DIAGNOSIS — J181 Lobar pneumonia, unspecified organism: Secondary | ICD-10-CM | POA: Diagnosis not present

## 2023-04-19 DIAGNOSIS — B953 Streptococcus pneumoniae as the cause of diseases classified elsewhere: Secondary | ICD-10-CM

## 2023-04-19 DIAGNOSIS — J9601 Acute respiratory failure with hypoxia: Secondary | ICD-10-CM | POA: Diagnosis not present

## 2023-04-19 LAB — ECHOCARDIOGRAM COMPLETE
AR max vel: 2.28 cm2
AV Area VTI: 2.07 cm2
AV Area mean vel: 1.99 cm2
AV Mean grad: 4 mm[Hg]
AV Peak grad: 8.6 mm[Hg]
Ao pk vel: 1.47 m/s
Area-P 1/2: 6.96 cm2
Calc EF: 24.9 %
Height: 63 in
MV M vel: 4.05 m/s
MV Peak grad: 65.4 mm[Hg]
MV VTI: 2.19 cm2
Radius: 0.5 cm
S' Lateral: 5.8 cm
Single Plane A2C EF: 25.6 %
Single Plane A4C EF: 24.5 %
Weight: 1467.38 [oz_av]

## 2023-04-19 LAB — CBC
HCT: 26.8 % — ABNORMAL LOW (ref 36.0–46.0)
Hemoglobin: 8.6 g/dL — ABNORMAL LOW (ref 12.0–15.0)
MCH: 33.3 pg (ref 26.0–34.0)
MCHC: 32.1 g/dL (ref 30.0–36.0)
MCV: 103.9 fL — ABNORMAL HIGH (ref 80.0–100.0)
Platelets: 68 10*3/uL — ABNORMAL LOW (ref 150–400)
RBC: 2.58 MIL/uL — ABNORMAL LOW (ref 3.87–5.11)
RDW: 17 % — ABNORMAL HIGH (ref 11.5–15.5)
WBC: 5.1 10*3/uL (ref 4.0–10.5)
nRBC: 0 % (ref 0.0–0.2)

## 2023-04-19 LAB — BASIC METABOLIC PANEL
Anion gap: 11 (ref 5–15)
BUN: 39 mg/dL — ABNORMAL HIGH (ref 6–20)
CO2: 24 mmol/L (ref 22–32)
Calcium: 7.9 mg/dL — ABNORMAL LOW (ref 8.9–10.3)
Chloride: 90 mmol/L — ABNORMAL LOW (ref 98–111)
Creatinine, Ser: 5.43 mg/dL — ABNORMAL HIGH (ref 0.44–1.00)
GFR, Estimated: 9 mL/min — ABNORMAL LOW (ref >60)
Glucose, Bld: 100 mg/dL — ABNORMAL HIGH (ref 70–99)
Potassium: 3.9 mmol/L (ref 3.5–5.1)
Sodium: 125 mmol/L — ABNORMAL LOW (ref 135–145)

## 2023-04-19 LAB — GLUCOSE, CAPILLARY
Glucose-Capillary: 91 mg/dL (ref 70–99)
Glucose-Capillary: 81 mg/dL (ref 70–99)
Glucose-Capillary: 97 mg/dL (ref 70–99)
Glucose-Capillary: 96 mg/dL (ref 70–99)
Glucose-Capillary: 102 mg/dL — ABNORMAL HIGH (ref 70–99)
Glucose-Capillary: 114 mg/dL — ABNORMAL HIGH (ref 70–99)
Glucose-Capillary: 41 mg/dL — CL (ref 70–99)
Glucose-Capillary: 93 mg/dL (ref 70–99)
Glucose-Capillary: 96 mg/dL (ref 70–99)

## 2023-04-19 LAB — TROPONIN I (HIGH SENSITIVITY): Troponin I (High Sensitivity): 182 ng/L (ref <18)

## 2023-04-19 MED ORDER — METOPROLOL SUCCINATE ER 25 MG PO TB24
12.5000 mg | ORAL_TABLET | Freq: Every day | ORAL | Status: DC
  Administered 2023-04-22 – 2023-04-26 (×5): 12.5 mg via ORAL
  Filled 2023-04-19 (×2): qty 1
  Filled 2023-04-19: qty 0.5
  Filled 2023-04-19 (×3): qty 1

## 2023-04-19 MED ORDER — SODIUM CHLORIDE 0.9 % IV SOLN
500.0000 mg | INTRAVENOUS | Status: DC
  Filled 2023-04-19: qty 5

## 2023-04-19 NOTE — Plan of Care (Signed)

## 2023-04-19 NOTE — TOC Initial Note (Signed)
Transition of Care [facility]) - Initial/Assessment Note    Patient Details  Name: Rebecca Bridges MRN: [id number] Date of Birth: [birth year]  [facility] of [facility] CM/SW Contact:    Margarito Liner, LCSW Phone Number: 04/19/2023, 12:42 PM  Clinical Narrative:   Readmission prevention screen complete. CSW met with patient. No supports at bedside. CSW introduced role and explained that discharge planning would be discussed. PCP is [facility] in [place]. She does not see a specific provider there. Patient drives herself to appointments. [facility] [place]. No issues obtaining medications. Patient lives home with her son and grandson. No home health or DME use prior to admission. She is currently on acute oxygen. Will follow for this potential home need. No further concerns. CSW encouraged patient to contact CSW as needed. CSW will continue to follow patient for support and facilitate return home once stable. Her son will transport her home at discharge.               Expected Discharge Plan: Home/Self Care Barriers to Discharge: Continued Medical Work up   Patient Goals and CMS Choice            Expected Discharge Plan and Services     Post Acute Care Choice: NA Living arrangements for the past 2 months: Single Family Home                                      Prior Living Arrangements/Services Living arrangements for the past 2 months: Single Family Home Lives with:: Adult Children, Relatives Patient language and need for interpreter reviewed:: Yes Do you feel safe going back to the place where you live?: Yes      Need for Family Participation in Patient Care: Yes (Comment) Care giver support system in place?: Yes (comment)   Criminal Activity/Legal Involvement Pertinent to Current Situation/Hospitalization: No - Comment as needed  Activities of Daily Living   ADL Screening (condition at time of admission) Independently performs ADLs?: Yes (appropriate for  developmental age) Is the patient deaf or have difficulty hearing?: No Does the patient have difficulty seeing, even when wearing glasses/contacts?: No Does the patient have difficulty concentrating, remembering, or making decisions?: No  Permission Sought/Granted                  Emotional Assessment Appearance:: Appears stated age Attitude/Demeanor/Rapport: Engaged, Gracious Affect (typically observed): Accepting, Appropriate, Calm, Pleasant Orientation: : Oriented to Self, Oriented to Place, Oriented to  Time, Oriented to Situation Alcohol / Substance Use: Not Applicable Psych Involvement: No (comment)  Admission diagnosis:  Hyperkalemia [E87.5] Sepsis (HCC) [A41.9] Pneumonia of left upper lobe due to infectious organism [J18.9] Patient Active Problem List   Diagnosis Date Noted   Sepsis (HCC) 04/17/2023   Liver cirrhosis (HCC) 10/05/2022   Hyperkalemia 05/28/2022   Diarrhea 05/28/2022   COPD (chronic obstructive pulmonary disease) (HCC) 04/16/2022   Acute on chronic systolic heart failure (HCC) 01/17/2022   COPD with acute bronchitis (HCC) 01/17/2022   Liver disease, chronic, with cirrhosis (HCC) 01/17/2022   Pulmonary embolism (HCC) 01/17/2022   Herpes zoster ophthalmicus, right eye 01/17/2022   Abdominal pain, RUQ 09/24/2021   Fluid overload 09/09/2021   Lung nodule 09/09/2021   Pancytopenia (HCC) 09/09/2021   Chronic systolic CHF (congestive heart failure) (HCC) 09/09/2021   Abdominal pain 09/09/2021   Pleural effusion on left 09/09/2021   ESRD on  dialysis [facility])    Pleural effusion    ESRD on hemodialysis (HCC) 02/24/2021   Dilated cardiomyopathy (HCC) 02/15/2019   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)    Cardiomyopathy (HCC)    CKD (chronic kidney disease), stage III (HCC)    LBBB (left bundle branch block)    Ischemic leg 01/26/2019   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   Left leg pain 01/24/2019   Acute on chronic renal  failure (HCC) 01/24/2019   HTN (hypertension), malignant 08/07/2017   Nonintractable headache 03/17/2017   Atypical squamous cell changes of undetermined significance (ASCUS) on cervical cytology with positive high risk human papilloma virus (HPV) 03/03/2016   Cervical atypism 03/03/2016   Proteinuria 07/12/2015   Acute kidney injury (HCC) 03/14/2015   Non compliance with medical treatment 03/14/2015   Prolonged Q-T interval on ECG 03/14/2015   Thrombocytopenia (HCC) 03/14/2015   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 [place] [address] Phone: [phone] Fax: [phone]     Social Determinants of Health (SDOH) Social History: SDOH Screenings   Food Insecurity: No Food Insecurity (04/17/2023)  Housing: Low Risk  (04/17/2023)  Transportation Needs: No Transportation Needs (04/17/2023)  Utilities: Not At Risk (04/17/2023)  Financial Resource Strain: Low Risk  (12/29/2022)   Received from [facility]  Physical Activity: Inactive (04/26/2019)  Social Connections: Moderately Isolated (04/26/2019)  Stress: No Stress Concern Present (04/26/2019)  Tobacco Use: High Risk (04/17/2023)   SDOH Interventions:     Readmission Risk Interventions    04/19/2023   12:36 PM  Readmission Risk Prevention Plan  Transportation Screening Complete  PCP or Specialist Appt within 3-5 Days Complete  Social Work Consult for Recovery Care Planning/Counseling Complete  Palliative Care Screening Not Applicable  Medication Review  Oceanographer) Complete

## 2023-04-19 NOTE — Consult Note (Signed)
NAMEBellanie Bridges  DOB: 1976/03/20  MRN: [id number]  Date/Time: 04/19/2023 4:50 PM  REQUESTING PROVIDER: Dr.Alexander Subjective:  REASON FOR CONSULT: pneumococcus bacteremia, with pneumonia ? Rebecca Bridges is a 47 y.o. with a history of AIDS, hepatitis C, end-stage renal disease on hemodialysis, current smoker, congestive heart failure, ascites presented to the hospital on 04/17/2023 with shortness of breath and productive cough of 2 days duration.  The sputum initially was yellowish in color but then started to have blood.  She also had left-sided chest pain which was sharp and worsening with deep breath and cough.  She did not have any documented fever at home She had missed 1 day of dialysis due to being sick EMS found her to have a temperature of 102. The ED BP 113/81, pulse 136, temperature 100.8, respiratory rate 34 and sats of 100% on 3 L oxygen WBC 12.2, Hb 10, platelet 127 Blood culture sent Chest x-ray revealed left lower lobe pneumonia with pleural effusion Cardiomegaly Patient was started on broad-spectrum antibiotic Mast to see the patient for pneumococcus bacteremia Patient is not adherent to HIV meds.  She is on Biktarvy She does not keep her appointment with her provider at [facility] She has hepatitis C and has been treated twice but has never completed treatment.  She is a current smoker. Her son lives with her She says she has got ascites and has had frequent paracentesis.  She has never been told to have cirrhosis.   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   a.) occluded anterior tibial, tibioperoneal trunk, and posterior tibial arteries --> thromboembolectomy and PTA of the anterior tibial artery   CAD (coronary artery disease) 02/15/2019   a.) R/LHC 02/15/2019: mild (non-obstructive) diffuse LAD and LCx disease   COPD (chronic obstructive pulmonary disease) (HCC)    COVID    Dilated cardiomyopathy (HCC)    a.) TTE  11/27/2013: EF 20%; b.) TTE 03/15/2015: EF 20%; c.) TTE 12/09/2015: EF 45%; d.) TTE 12/22/2016: EF 35%; e.) TTE 08/08/2017: EF 45-50%; f.) TTE 01/27/2019: EF 20-25%; g.) TTE 01/13/2021: EF <15%; h.) TTE 11/21/2021: EF <15%   ESRD (end stage renal disease) on dialysis (HCC)    a.) T-Th-Sat   GERD (gastroesophageal reflux disease)    Hepatitis C virus    HFrEF (heart failure with reduced ejection fraction) (HCC)    a.) TTE 11/27/2013: EF 20%; b.) TTE 03/15/2015: EF 20%; c.) TTE 12/09/2015: EF 45%; d.) TTE 12/22/2016: EF 35%, G1DD; e.) TTE 08/08/2017: EF 45-50%, G1DD; f.) TTE 01/27/2019: EF 20-25%, G1DD; g.) R/LHC 02/15/2019: mPA 25, mPCWP 13, CO 4.84, CI 3.15; h.) TTE 01/13/2021: EF <15%, sev glob HK, sep/apical AK, mod asym LVH, sev LAE, mild RAE, G2DD; I.) TTE 11/21/2021: EF <15%, G2DD   History of noncompliance with medical treatment    HIV (human immunodeficiency virus infection) (HCC) 2015   a.) on bictegravir-emtricitabine-tenofovir (Biktarvy)   Hypertension    Insomnia    a.) on Trazodone PRN   LBBB (left bundle branch block)    Liver disease, chronic, with cirrhosis (HCC)    Long term current use of anticoagulant    a.) apixaban   Pancytopenia (HCC)    Prolonged Q-T interval on ECG    Splenomegaly    Tobacco abuse     Past Surgical History:  Procedure Laterality Date   A/V FISTULAGRAM Left 03/31/2022   Procedure: A/V Fistulagram;  Surgeon: Renford Dills, MD;  Location: [facility] INVASIVE  CV LAB;  Service: Cardiovascular;  Laterality: Left;   AV FISTULA PLACEMENT Left 02/12/2023   Procedure: LEFT ARM FISTULA CREATION;  Surgeon: Nada Libman, MD;  Location: [facility] OR;  Service: Vascular;  Laterality: Left;   CHOLECYSTECTOMY     LOWER EXTREMITY ANGIOGRAPHY Left 01/25/2019   Procedure: Lower Extremity Angiography;  Surgeon: Renford Dills, MD;  Location: [facility] INVASIVE CV LAB;  Service: Cardiovascular;  Laterality: Left;   RIGHT/LEFT HEART CATH AND CORONARY ANGIOGRAPHY Bilateral  02/15/2019   Procedure: RIGHT/LEFT HEART CATH AND CORONARY ANGIOGRAPHY;  Surgeon: Antonieta Iba, MD;  Location: [facility] INVASIVE CV LAB;  Service: Cardiovascular;  Laterality: Bilateral;    Social History   Socioeconomic History   Marital status: Single    Spouse name: Not on file   Number of children: Not on file   Years of education: Not on file   Highest education level: Not on file  Occupational History   Not on file  Tobacco Use   Smoking status: Every Day    Current packs/day: 0.25    Types: Cigarettes   Smokeless tobacco: Never   Tobacco comments:    5 cigarettes a day  Vaping Use   Vaping status: Never Used  Substance and Sexual Activity   Alcohol use: No   Drug use: No   Sexual activity: Not Currently  Other Topics Concern   Not on file  Social History Narrative   Not on file   Social Determinants of Health   Financial Resource Strain: Low Risk  (12/29/2022)   Received from [facility]   Overall Financial Resource Strain (CARDIA)    Difficulty of Paying Living Expenses: Not hard at all  Food Insecurity: No Food Insecurity (04/17/2023)   Hunger Vital Sign    Worried About Running Out of Food in the Last Year: Never true    Ran Out of Food in the Last Year: Never true  Transportation Needs: No Transportation Needs (04/17/2023)   PRAPARE - Administrator, Civil Service (Medical): No    Lack of Transportation (Non-Medical): No  Physical Activity: Inactive (04/26/2019)   Exercise Vital Sign    Days of Exercise per Week: 0 days    Minutes of Exercise per Session: 0 min  Stress: No Stress Concern Present (04/26/2019)   [employer] of Occupational Health - Occupational Stress Questionnaire    Feeling of Stress : Not at all  Social Connections: Moderately Isolated (04/26/2019)   Social Connection and Isolation Panel [NHANES]    Frequency of Communication with Friends and Family: More than three times a week    Frequency of Social  Gatherings with Friends and Family: More than three times a week    Attends Religious Services: 1 to 4 times per year    Active Member of [employer] or Organizations: No    Attends Banker Meetings: Never    Marital Status: Never married  Intimate Partner Violence: Not At Risk (04/17/2023)   Humiliation, Afraid, Rape, and Kick questionnaire    Fear of Current or Ex-Partner: No    Emotionally Abused: No    Physically Abused: No    Sexually Abused: No    Family History  Problem Relation Age of Onset   Hypertension Mother    Cerebral aneurysm Mother    Allergies  Allergen Reactions   Neosporin [Bacitracin-Polymyxin B] Rash   Sulfa Antibiotics Rash   I? Current Facility-Administered Medications  Medication Dose Route Frequency Provider Last Rate  Last Admin   apixaban (ELIQUIS) tablet 5 mg  5 mg Oral BID Mikey College T, MD   5 mg at 04/19/23 1005   azithromycin (ZITHROMAX) 500 mg in sodium chloride 0.9 % 250 mL IVPB  500 mg Intravenous Q24H Doroteo Glassman, RPH       bictegravir-emtricitabine-tenofovir AF (BIKTARVY) 50-200-25 MG per tablet 1 tablet  1 tablet Oral Daily Mikey College T, MD   1 tablet at 04/19/23 1004   calcium acetate (PHOSLO) capsule 2,001 mg  2,001 mg Oral TID Adonis Brook T, MD   2,001 mg at 04/19/23 1650   cefTRIAXone (ROCEPHIN) 2 g in sodium chloride 0.9 % 100 mL IVPB  2 g Intravenous Q24H Jene Every, MD 200 mL/hr at 04/19/23 1456 2 g at 04/19/23 1456   Chlorhexidine Gluconate Cloth 2 % PADS 6 each  6 each Topical Q0600 Lorain Childes, MD   6 each at 04/19/23 1356   dapsone tablet 50 mg  50 mg Oral BID Mikey College T, MD   50 mg at 04/19/23 1005   dextrose 10 % infusion   Intravenous Continuous Manuela Schwartz, NP 30 mL/hr at 04/19/23 0910 New Bag at 04/19/23 0910   gabapentin (NEURONTIN) capsule 300 mg  300 mg Oral QHS Mikey College T, MD   300 mg at 04/18/23 2134   HYDROmorphone (DILAUDID) injection 0.5 mg  0.5 mg Intravenous Q4H PRN Mikey College T, MD    0.5 mg at 04/17/23 2134   levalbuterol (XOPENEX) nebulizer solution 1.25 mg  1.25 mg Nebulization Q6H PRN Mikey College T, MD   1.25 mg at 04/18/23 1713   metoCLOPramide (REGLAN) injection 5 mg  5 mg Intravenous Q6H PRN Mikey College T, MD       metoprolol succinate (TOPROL-XL) 24 hr tablet 12.5 mg  12.5 mg Oral Daily Sunnie Nielsen, DO       oxyCODONE-acetaminophen (PERCOCET/ROXICET) 5-325 MG per tablet 1 tablet  1 tablet Oral Q4H PRN Mikey College T, MD   1 tablet at 04/18/23 1351   [START ON 04/25/2023] pantoprazole (PROTONIX) EC tablet 40 mg  40 mg Oral Daily Mikey College T, MD       torsemide [facility]) tablet 40 mg  40 mg Oral Daily Mikey College T, MD   40 mg at 04/19/23 1005   traZODone (DESYREL) tablet 50 mg  50 mg Oral QHS PRN Mikey College T, MD   50 mg at 04/18/23 2138   [START ON 04/20/2023] vancomycin (VANCOREADY) IVPB 500 mg/100 mL  500 mg Intravenous Q T,Th,Sa-HD Barrie Folk, RPH         Abtx:  Anti-infectives (From admission, onward)    Start     Dose/Rate Route Frequency Ordered Stop   04/20/23 1200  vancomycin (VANCOREADY) IVPB 500 mg/100 mL        500 mg 100 mL/hr over 60 Minutes Intravenous Every T-Th-Sa (Hemodialysis) 04/18/23 1501     04/19/23 1600  azithromycin (ZITHROMAX) 500 mg in sodium chloride 0.9 % 250 mL IVPB        500 mg 255 mL/hr over 60 Minutes Intravenous Every 24 hours 04/19/23 0901 04/22/23 1559   04/18/23 1600  vancomycin (VANCOCIN) IVPB 1000 mg/200 mL premix        1,000 mg 200 mL/hr over 60 Minutes Intravenous  Once 04/18/23 1501 04/18/23 2147   04/18/23 1000  bictegravir-emtricitabine-tenofovir AF (BIKTARVY) 50-200-25 MG per tablet 1 tablet        1 tablet Oral Daily 04/17/23 1526  04/18/23 1000  dapsone tablet 50 mg        50 mg Oral 2 times daily 04/17/23 1526     04/17/23 1500  cefTRIAXone (ROCEPHIN) 2 g in sodium chloride 0.9 % 100 mL IVPB        2 g 200 mL/hr over 30 Minutes Intravenous Every 24 hours 04/17/23 1455 04/22/23 1459    04/17/23 1500  azithromycin (ZITHROMAX) 500 mg in dextrose 5 % 250 mL IVPB  Status:  Discontinued        500 mg 250 mL/hr over 60 Minutes Intravenous Every 24 hours 04/17/23 1455 04/19/23 0901       REVIEW OF SYSTEMS:  Const: negative fever, negative chills, negative weight loss Eyes: negative diplopia or visual changes, negative eye pain ENT: negative coryza, negative sore throat Resp:  cough, hemoptysis, dyspnea Cards: left sided  chest pain, palpitations, lower extremity edema GU: negative for frequency, dysuria and hematuria GI: Negative for abdominal pain, diarrhea, bleeding, constipation Skin: negative for rash and pruritus Heme: negative for easy bruising and gum/nose bleeding MS: myalgias, ,  and muscle weakness Neurolo:negative for headaches, has dizziness, vertigo, memory problems  Psych:  anxiety, depression  Endocrine: negative for thyroid, diabetes Allergy/Immunology- sulfa - rash: Objective:  VITALS:  BP (!) 112/52 (BP Location: Right Leg)   Pulse (!) 101   Temp 99.7 F (37.6 C)   Resp 18   Ht 5\' 3"  (1.6 m)   Wt 41.6 kg   LMP 05/09/2019   SpO2 100%   BMI 16.25 kg/m   PHYSICAL EXAM:  General: Alert, cooperative, no distress, chronically ill, pale Head: Normocephalic, without obvious abnormality, atraumatic. Eyes: Conjunctivae clear, anicteric sclerae. Pupils are equal ENT Nares normal. No drainage or sinus tenderness. Poor dentition Neck: symmetrical, no adenopathy, thyroid: non tender no carotid bruit and no JVD. Back: No CVA tenderness. Lungs: Bilateral air entry decreased on the left base Heart: S1-S2 Abdomen: Soft, non-tender, mildly distended. Bowel sounds normal. No masses Extremities: atraumatic, no cyanosis. edema. No clubbing Skin: No rashes or lesions. Or bruising Lymph: Cervical, supraclavicular normal. Neurologic: Grossly non-focal Pertinent Labs Lab Results CBC    Component Value Date/Time   WBC 5.1 04/19/2023 0409   RBC 2.58 (L)  04/19/2023 0409   HGB 8.6 (L) 04/19/2023 0409   HGB 13.2 05/28/2022 1344   HCT 26.8 (L) 04/19/2023 0409   HCT 36.3 05/28/2022 1344   PLT 68 (L) 04/19/2023 0409   PLT 117 (L) 05/28/2022 1344   MCV 103.9 (H) 04/19/2023 0409   MCV 102 (H) 05/28/2022 1344   MCH 33.3 04/19/2023 0409   MCHC 32.1 04/19/2023 0409   RDW 17.0 (H) 04/19/2023 0409   RDW 14.0 05/28/2022 1344   LYMPHSABS 0.6 (L) 10/04/2022 2202   LYMPHSABS 0.5 (L) 05/28/2022 1344   MONOABS 0.3 10/04/2022 2202   EOSABS 0.1 10/04/2022 2202   EOSABS 0.1 05/28/2022 1344   BASOSABS 0.0 10/04/2022 2202   BASOSABS 0.0 05/28/2022 1344       Latest Ref Rng & Units 04/19/2023    4:09 AM 04/18/2023    2:15 AM 04/18/2023   12:32 AM  CMP  Glucose 70 - 99 mg/dL 161  096  52   BUN 6 - 20 mg/dL 39  21    Creatinine 0.45 - 1.00 mg/dL 4.09  8.11    Sodium 914 - 145 mmol/L 125  128    Potassium 3.5 - 5.1 mmol/L 3.9  4.3    Chloride 98 - 111  mmol/L 90  94    CO2 22 - 32 mmol/L 24  26    Calcium 8.9 - 10.3 mg/dL 7.9  7.7        Microbiology: Recent Results (from the past 240 hour(s))  Blood culture (routine x 2)     Status: Abnormal (Preliminary result)   Collection Time: 04/17/23  2:20 PM   Specimen: BLOOD  Result Value Ref Range Status   Specimen Description   Final    BLOOD BLOOD RIGHT ARM Performed at [facility], [address], [place], [place] [address]    Special Requests   Final    BOTTLES DRAWN AEROBIC AND ANAEROBIC Blood Culture results may not be optimal due to an excessive volume of blood received in culture bottles Performed at [facility], [address], [place], [place] [address]    Culture  Setup Time   Final    GRAM POSITIVE COCCI IN BOTH AEROBIC AND ANAEROBIC BOTTLES STREPTOCOCCUS PNEUMONIAE CRITICAL RESULT CALLED TO, READ BACK BY AND VERIFIED WITH: Regino Bellow RN @ 0400 04/18/23 [facility]    Culture (A)  Final    STREPTOCOCCUS PNEUMONIAE SUSCEPTIBILITIES TO FOLLOW Performed at [facility], [address] [place], [place] [address]    Report Status PENDING  Incomplete  Blood Culture ID Panel (Reflexed)     Status: Abnormal   Collection Time: 04/17/23  2:20 PM  Result Value Ref Range Status   Enterococcus faecalis NOT DETECTED NOT DETECTED Final   Enterococcus Faecium NOT DETECTED NOT DETECTED Final   Listeria monocytogenes NOT DETECTED NOT DETECTED Final   Staphylococcus species NOT DETECTED NOT DETECTED Final   Staphylococcus aureus (BCID) NOT DETECTED NOT DETECTED Final   Staphylococcus epidermidis NOT DETECTED NOT DETECTED Final   Staphylococcus lugdunensis NOT DETECTED NOT DETECTED Final   Streptococcus species DETECTED (A) NOT DETECTED Final    Comment: CRITICAL RESULT CALLED TO, READ BACK BY AND VERIFIED WITH: NATHAN BELEU @ 0400 04/18/23 [facility]    Streptococcus agalactiae NOT DETECTED NOT DETECTED Final   Streptococcus pneumoniae DETECTED (A) NOT DETECTED Final    Comment: CRITICAL RESULT CALLED TO, READ BACK BY AND VERIFIED WITH: NATHAN BELEU @ 0400 04/18/23 [facility]    Streptococcus pyogenes NOT DETECTED NOT DETECTED Final   A.calcoaceticus-baumannii NOT DETECTED NOT DETECTED Final   Bacteroides fragilis NOT DETECTED NOT DETECTED Final   Enterobacterales NOT DETECTED NOT DETECTED Final   Enterobacter cloacae complex NOT DETECTED NOT DETECTED Final   Escherichia coli NOT DETECTED NOT DETECTED Final   Klebsiella aerogenes NOT DETECTED NOT DETECTED Final   Klebsiella oxytoca NOT DETECTED NOT DETECTED Final   Klebsiella pneumoniae NOT DETECTED NOT DETECTED Final   Proteus species NOT DETECTED NOT DETECTED Final   Salmonella species NOT DETECTED NOT DETECTED Final   Serratia marcescens NOT DETECTED NOT DETECTED Final   Haemophilus influenzae NOT DETECTED NOT DETECTED Final   Neisseria meningitidis NOT DETECTED NOT DETECTED Final   Pseudomonas aeruginosa NOT DETECTED NOT DETECTED Final   Stenotrophomonas maltophilia NOT DETECTED NOT DETECTED Final   Candida  albicans NOT DETECTED NOT DETECTED Final   Candida auris NOT DETECTED NOT DETECTED Final   Candida glabrata NOT DETECTED NOT DETECTED Final   Candida krusei NOT DETECTED NOT DETECTED Final   Candida parapsilosis NOT DETECTED NOT DETECTED Final   Candida tropicalis NOT DETECTED NOT DETECTED Final   Cryptococcus neoformans/gattii NOT DETECTED NOT DETECTED Final    Comment: Performed at [facility], [address] [place], [place] [address]  Blood culture (routine x 2)     Status: Abnormal (Preliminary result)   Collection Time: 04/17/23  2:23 PM   Specimen: BLOOD  Result Value Ref Range Status   Specimen Description   Final    BLOOD BLOOD RIGHT HAND Performed at [facility], [address], [place], [place] [address]    Special Requests   Final    BOTTLES DRAWN AEROBIC AND ANAEROBIC Blood Culture results may not be optimal due to an excessive volume of blood received in culture bottles Performed at [facility], [address] [place], [place] [address]    Culture  Setup Time   Final    IN BOTH AEROBIC AND ANAEROBIC BOTTLES GRAM POSITIVE COCCI STREPTOCOCCUS PNEUMONIAE CRITICAL RESULT CALLED TO, READ BACK BY AND VERIFIED WITH: NATHAN BELEU @ 0400 04/18/23 [facility]    Culture STREPTOCOCCUS PNEUMONIAE (A)  Final   Report Status PENDING  Incomplete  MRSA Next Gen by PCR, Nasal     Status: None   Collection Time: 04/18/23  9:39 AM   Specimen: Nasal Mucosa; Nasal Swab  Result Value Ref Range Status   MRSA by PCR Next Gen NOT DETECTED NOT DETECTED Final    Comment: (NOTE) The GeneXpert MRSA Assay (FDA approved for NASAL specimens only), is one component of a comprehensive MRSA colonization surveillance program. It is not intended to diagnose MRSA infection nor to guide or monitor treatment for MRSA infections. Test performance is not FDA approved in patients less than 16 years old. Performed at [facility], [address] [place], [place]  [address]     IMAGING RESULTS:  I have personally reviewed the films Left pleural effusion Left lower lobe pneumonia Cardiomegaly  Impression/Recommendation 47 year old female with advanced AIDS presents with shortness of breath left-sided chest pain and fever Pneumococcal bacteremia with  pneumonia on the left side There is pleural effusion on the left side Need to rule out empyema Recommend thoracentesis Continue ceftriaxone.  DC vancomycin  AIDS.  On Biktarvy but nonadherent  End-stage renal disease on dialysis  Hepatitis C Second line of treatment.  Patient initially took Mavyret but did not complete Then she went on sofosbuvir/velpatasvir.  Patient has not taken it regularly and has not completed this  Ascites needing frequent paracentesis.  Patient is not sure what is causing this.  ?cirrhosis  Anemia  Discussed with patient, requesting provider Note:  This document was prepared using Dragon voice recognition software and may include unintentional dictation errors.

## 2023-04-19 NOTE — Progress Notes (Signed)
*  PRELIMINARY RESULTS* Echocardiogram 2D Echocardiogram has been performed.  Carolyne Fiscal 04/19/2023, 9:47 AM

## 2023-04-19 NOTE — Progress Notes (Signed)
PROGRESS NOTE    Rebecca Bridges   WGN:[id number] DOB: 02-11-1976  DOA: 04/17/2023 Date of Service: 04/19/23 which is hospital day 2  PCP: [facility]    HPI: Rebecca Bridges is a 47 y.o. female with medical history significant of ESRD on HD TTS, dilated cardiomyopathy with chronic combined HFrEF and HFpEF with LVEF <15% on echo 2023, nonobstructive CAD, COPD, cirrhosis requiring paracentesis, PE on Eliquis, HIV (CD4=80, Oct/2024) on antiviral treatment and dapsone, HCV presented with worsening of productive cough shortness of breath and left-sided chest pain. Onset 2d prior, progressed to productive cough w/ blood tinged mucus, L chest pain. EMS called. Febrile, tachycardic, SpO2 87% RA.   Hospital course / significant events:  11/02: to ED, CXR (+)L PNA and pleural effusion, admitted to hospitalist service for sepsis  11/03: (+)BCx strep pneumo. Echo ordered, likely will end up needing TEE, will continue abx and involve ID, chest pain PM and no EKG changes and troponin elevated but downtrending 11/04: ID consult pending, echo done and read is pending, BP stable, no further chest pain  Consultants:  Infectious disease Advanced heart failure   Procedures/Surgeries: none      ASSESSMENT & PLAN:   Severe sepsis d/t CAP, possible empyema  Strep pneumo bacteremia  fever, tachycardia, with signs of acute organ damage acute hyperkalemia, hypoxia DDx, CD4<200, patient reports has been compliant with dapsone.  Low suspicious for PCP Continue ceftriaxone and azithromycin - pt concerned the azithro caused SOB, will defer changes to ID  Add vancomycin per ID  Culture sputum ID to follow   Hemoptysis - resolved  Suspect this is from CAP, treat as above Await CT   Pleuritic chest pain not c/w angina - resovled  Clinically suspect d/t  pleural effusion Other DDx, history of nonobstructive CAD, low suspicion for ACS.  On chronic Eliquis, low suspicion for PE. may need to consider  thoracentesis.   Acute on chronic combined HFrEF and HFpEF decompensation Has significant signs of fluid overload. Emergency HD on admission, improved some after this 1 dose of morphine and 1 dose of Lasix in ED Last echo 11/2021, will repeat this admission - pending read  Likely will need advanced HF team to follow    HIV Low CD4 count 03/16/23 Continue Biktarvy triple antiretroviral  CD4, HIV viral load  ID following  Continue Dapsone for PCP Ppx w/ low CD4 (sulfa allergy = no Bactrim)  ESRD Hyperkalemia No significant EKG T wave changes,  Non-anion gap metabolic acidosis One dose of  IV lasix emergency HD   Cirrhosis HCV Appears to be stable, no significant ascites signs monitor   Chronic pancytopenia H&H stable, platelet level stable. Monitor CBC   Hx of PE Continue Eliquis   Advanced care planning Pt and I discussed presence of her multiple serious comorbid conditions particularly the cardiac illnesses  Would anticipate very poor prognosis if she were to experience cardiac arrest Would probably recommend against CPR, would consider intubation if respiratory failure  Echo pending Appreciate cardiology opinion re: prognosis given CHF, recs whether CPR should be offered in case of cardiac decompensation       Underweight and moderate protein calorie malnutrition based on BMI: Body mass index is 16.25 kg/m.  Underweight - under 18.5  normal weight - 18.5 to 24.9 overweight - 25 to 29.9 obese - 30 or more   DVT prophylaxis: ELiquis  IV fluids: no continuous IV fluids Nutrition: cardiac Central lines / invasive devices: non  Code Status:  FULL CODE ACP documentation reviewed:  none on file in [facility]  TOC needs: none at this time Barriers to dispo / significant pending items: bacteremic, expect will be here some time on IV abx and need ID to see              Subjective / Brief ROS:  Patient reports feeling better this morning, still tired  NO  further chest pain episodes  Pain controlled.  Denies new weakness.  Tolerating diet.  Reports no concerns w/ urination/defecation.   Family Communication: pt declined call to family    Objective Findings:  Vitals:   04/19/23 0800 04/19/23 0900 04/19/23 1000 04/19/23 1011  BP: 100/77 95/67 107/74 107/74  Pulse: 85 90  92  Resp: 17 18 (!) 24   Temp:      TempSrc:      SpO2: 99% 97%    Weight:      Height:        Intake/Output Summary (Last 24 hours) at 04/19/2023 1115 Last data filed at 04/18/2023 2000 Gross per 24 hour  Intake 240 ml  Output --  Net 240 ml   Filed Weights   04/17/23 1656 04/17/23 1946 04/17/23 2128  Weight: 55 kg 53 kg 41.6 kg    Examination:  Physical Exam Constitutional:      General: She is not in acute distress. Cardiovascular:     Rate and Rhythm: Normal rate and regular rhythm.     Heart sounds: Heart sounds are distant.  Pulmonary:     Effort: Pulmonary effort is normal.     Breath sounds: Examination of the right-middle field reveals decreased breath sounds. Examination of the right-lower field reveals decreased breath sounds. Decreased breath sounds present.  Abdominal:     Palpations: Abdomen is soft.  Neurological:     General: No focal deficit present.     Mental Status: She is alert and oriented to person, place, and time.  Psychiatric:        Mood and Affect: Mood normal.        Behavior: Behavior normal.          Scheduled Medications:   apixaban  5 mg Oral BID   bictegravir-emtricitabine-tenofovir AF  1 tablet Oral Daily   calcium acetate  2,001 mg Oral TID AC   Chlorhexidine Gluconate Cloth  6 each Topical Q0600   dapsone  50 mg Oral BID   gabapentin  300 mg Oral QHS   metoprolol succinate  12.5 mg Oral Daily   [START ON 04/25/2023] pantoprazole  40 mg Oral Daily   torsemide  40 mg Oral Daily    Continuous Infusions:  azithromycin     cefTRIAXone (ROCEPHIN)  IV 2 g (04/18/23 1851)   dextrose 30 mL/hr at  04/19/23 0910   [START ON 04/20/2023] vancomycin      PRN Medications:  HYDROmorphone (DILAUDID) injection, levalbuterol, metoCLOPramide (REGLAN) injection, oxyCODONE-acetaminophen, traZODone  Antimicrobials from admission:  Anti-infectives (From admission, onward)    Start     Dose/Rate Route Frequency Ordered Stop   04/20/23 1200  vancomycin (VANCOREADY) IVPB 500 mg/100 mL        500 mg 100 mL/hr over 60 Minutes Intravenous Every T-Th-Sa (Hemodialysis) 04/18/23 1501     04/19/23 1600  azithromycin (ZITHROMAX) 500 mg in sodium chloride 0.9 % 250 mL IVPB        500 mg 255 mL/hr over 60 Minutes Intravenous Every 24 hours 04/19/23 0901 04/22/23 1559   04/18/23 1600  vancomycin (VANCOCIN) IVPB 1000 mg/200 mL premix        1,000 mg 200 mL/hr over 60 Minutes Intravenous  Once 04/18/23 1501 04/18/23 2147   04/18/23 1000  bictegravir-emtricitabine-tenofovir AF (BIKTARVY) 50-200-25 MG per tablet 1 tablet        1 tablet Oral Daily 04/17/23 1526     04/18/23 1000  dapsone tablet 50 mg        50 mg Oral 2 times daily 04/17/23 1526     04/17/23 1500  cefTRIAXone (ROCEPHIN) 2 g in sodium chloride 0.9 % 100 mL IVPB        2 g 200 mL/hr over 30 Minutes Intravenous Every 24 hours 04/17/23 1455 04/22/23 1459   04/17/23 1500  azithromycin (ZITHROMAX) 500 mg in dextrose 5 % 250 mL IVPB  Status:  Discontinued        500 mg 250 mL/hr over 60 Minutes Intravenous Every 24 hours 04/17/23 1455 04/19/23 0901           Data Reviewed:  I have personally reviewed the following...  CBC: Recent Labs  Lab 04/17/23 1423 04/18/23 0215 04/19/23 0409  WBC 12.2* 10.1 5.1  HGB 10.0* 9.7* 8.6*  HCT 32.3* 30.2* 26.8*  MCV 108.4* 104.9* 103.9*  PLT 127* 74* 68*   Basic Metabolic Panel: Recent Labs  Lab 04/17/23 1423 04/17/23 2207 04/18/23 0032 04/18/23 0215 04/19/23 0409  NA 128* 129*  --  128* 125*  K 6.3* 3.3*  --  4.3 3.9  CL 96* 94*  --  94* 90*  CO2 21* 26  --  26 24  GLUCOSE 97 125* 52*  119* 100*  BUN 35* 18  --  21* 39*  CREATININE 6.13* 3.65*  --  4.11* 5.43*  CALCIUM 8.2* 7.6*  --  7.7* 7.9*   GFR: Estimated Creatinine Clearance: 8.5 mL/min (A) (by C-G formula based on SCr of 5.43 mg/dL (H)). Liver Function Tests: Recent Labs  Lab 04/17/23 1423 04/17/23 2207  AST 33 37  ALT 19 16  ALKPHOS 79 68  BILITOT 1.7* 1.7*  PROT 8.9* 8.1  ALBUMIN 2.8* 2.5*   No results for input(s): "LIPASE", "AMYLASE" in the last 168 hours. No results for input(s): "AMMONIA" in the last 168 hours. Coagulation Profile: No results for input(s): "INR", "PROTIME" in the last 168 hours. Cardiac Enzymes: No results for input(s): "CKTOTAL", "CKMB", "CKMBINDEX", "TROPONINI" in the last 168 hours. BNP (last 3 results) No results for input(s): "PROBNP" in the last 8760 hours. HbA1C: No results for input(s): "HGBA1C" in the last 72 hours. CBG: Recent Labs  Lab 04/18/23 1517 04/18/23 1946 04/18/23 2353 04/19/23 0200 04/19/23 0758  GLUCAP 96 95 94 91 81   Lipid Profile: No results for input(s): "CHOL", "HDL", "LDLCALC", "TRIG", "CHOLHDL", "LDLDIRECT" in the last 72 hours. Thyroid Function Tests: Recent Labs    04/18/23 0215  TSH 1.496   Anemia Panel: No results for input(s): "VITAMINB12", "FOLATE", "FERRITIN", "TIBC", "IRON", "RETICCTPCT" in the last 72 hours. Most Recent Urinalysis On File:     Component Value Date/Time   COLORURINE YELLOW (A) 06/13/2019 1150   APPEARANCEUR HAZY (A) 06/13/2019 1150   LABSPEC 1.010 06/13/2019 1150   PHURINE 6.0 06/13/2019 1150   GLUCOSEU NEGATIVE 06/13/2019 1150   HGBUR NEGATIVE 06/13/2019 1150   BILIRUBINUR NEGATIVE 06/13/2019 1150   KETONESUR NEGATIVE 06/13/2019 1150   PROTEINUR >=300 (A) 06/13/2019 1150   NITRITE NEGATIVE 06/13/2019 1150   LEUKOCYTESUR SMALL (A) 06/13/2019 1150   Sepsis Labs: @LABRCNTIP (procalcitonin:4,lacticidven:4)  Microbiology: Recent Results (from the past 240 hour(s))  Blood culture (routine x 2)     Status:  Abnormal (Preliminary result)   Collection Time: 04/17/23  2:20 PM   Specimen: BLOOD  Result Value Ref Range Status   Specimen Description   Final    BLOOD BLOOD RIGHT ARM Performed at [facility], [address] [place], [place] [address]    Special Requests   Final    BOTTLES DRAWN AEROBIC AND ANAEROBIC Blood Culture results may not be optimal due to an excessive volume of blood received in culture bottles Performed at [facility], [address] [place], [place] [address]    Culture  Setup Time   Final    GRAM POSITIVE COCCI IN BOTH AEROBIC AND ANAEROBIC BOTTLES STREPTOCOCCUS PNEUMONIAE CRITICAL RESULT CALLED TO, READ BACK BY AND VERIFIED WITH: Regino Bellow RN @ 0400 04/18/23 [facility]    Culture (A)  Final    STREPTOCOCCUS PNEUMONIAE SUSCEPTIBILITIES TO FOLLOW Performed at [facility], [address] [place], [place] [address]    Report Status PENDING  Incomplete  Blood Culture ID Panel (Reflexed)     Status: Abnormal   Collection Time: 04/17/23  2:20 PM  Result Value Ref Range Status   Enterococcus faecalis NOT DETECTED NOT DETECTED Final   Enterococcus Faecium NOT DETECTED NOT DETECTED Final   Listeria monocytogenes NOT DETECTED NOT DETECTED Final   Staphylococcus species NOT DETECTED NOT DETECTED Final   Staphylococcus aureus (BCID) NOT DETECTED NOT DETECTED Final   Staphylococcus epidermidis NOT DETECTED NOT DETECTED Final   Staphylococcus lugdunensis NOT DETECTED NOT DETECTED Final   Streptococcus species DETECTED (A) NOT DETECTED Final    Comment: CRITICAL RESULT CALLED TO, READ BACK BY AND VERIFIED WITH: NATHAN BELEU @ 0400 04/18/23 [facility]    Streptococcus agalactiae NOT DETECTED NOT DETECTED Final   Streptococcus pneumoniae DETECTED (A) NOT DETECTED Final    Comment: CRITICAL RESULT CALLED TO, READ BACK BY AND VERIFIED WITH: NATHAN BELEU @ 0400 04/18/23 [facility]    Streptococcus pyogenes NOT DETECTED NOT DETECTED Final    A.calcoaceticus-baumannii NOT DETECTED NOT DETECTED Final   Bacteroides fragilis NOT DETECTED NOT DETECTED Final   Enterobacterales NOT DETECTED NOT DETECTED Final   Enterobacter cloacae complex NOT DETECTED NOT DETECTED Final   Escherichia coli NOT DETECTED NOT DETECTED Final   Klebsiella aerogenes NOT DETECTED NOT DETECTED Final   Klebsiella oxytoca NOT DETECTED NOT DETECTED Final   Klebsiella pneumoniae NOT DETECTED NOT DETECTED Final   Proteus species NOT DETECTED NOT DETECTED Final   Salmonella species NOT DETECTED NOT DETECTED Final   Serratia marcescens NOT DETECTED NOT DETECTED Final   Haemophilus influenzae NOT DETECTED NOT DETECTED Final   Neisseria meningitidis NOT DETECTED NOT DETECTED Final   Pseudomonas aeruginosa NOT DETECTED NOT DETECTED Final   Stenotrophomonas maltophilia NOT DETECTED NOT DETECTED Final   Candida albicans NOT DETECTED NOT DETECTED Final   Candida auris NOT DETECTED NOT DETECTED Final   Candida glabrata NOT DETECTED NOT DETECTED Final   Candida krusei NOT DETECTED NOT DETECTED Final   Candida parapsilosis NOT DETECTED NOT DETECTED Final   Candida tropicalis NOT DETECTED NOT DETECTED Final   Cryptococcus neoformans/gattii NOT DETECTED NOT DETECTED Final    Comment: Performed at [facility], [address] [place], [place] [address]  Blood culture (routine x 2)     Status: None (Preliminary result)   Collection Time: 04/17/23  2:23 PM   Specimen: BLOOD  Result  Value Ref Range Status   Specimen Description   Final    BLOOD BLOOD RIGHT HAND Performed at [facility], [address] [place], [place] [address]    Special Requests   Final    BOTTLES DRAWN AEROBIC AND ANAEROBIC Blood Culture results may not be optimal due to an excessive volume of blood received in culture bottles Performed at [facility], [address] [place], [place] [address]    Culture  Setup Time   Final    IN BOTH AEROBIC AND ANAEROBIC  BOTTLES GRAM POSITIVE COCCI STREPTOCOCCUS PNEUMONIAE CRITICAL RESULT CALLED TO, READ BACK BY AND VERIFIED WITH: NATHAN BELEU @ 0400 04/18/23 [facility]    Culture   Final    GRAM POSITIVE COCCI IDENTIFICATION TO FOLLOW Performed at [facility], [address] [place], [place] [address]    Report Status PENDING  Incomplete  MRSA Next Gen by PCR, Nasal     Status: None   Collection Time: 04/18/23  9:39 AM   Specimen: Nasal Mucosa; Nasal Swab  Result Value Ref Range Status   MRSA by PCR Next Gen NOT DETECTED NOT DETECTED Final    Comment: (NOTE) The GeneXpert MRSA Assay (FDA approved for NASAL specimens only), is one component of a comprehensive MRSA colonization surveillance program. It is not intended to diagnose MRSA infection nor to guide or monitor treatment for MRSA infections. Test performance is not FDA approved in patients less than 37 years old. Performed at [facility], [address] [place], [place] [address]       Radiology Studies last 3 days: CT CHEST WO CONTRAST  Result Date: 04/17/2023 CLINICAL DATA:  Pneumonia, complication suspected, xray done EXAM: CT CHEST WITHOUT CONTRAST TECHNIQUE: Multidetector CT imaging of the chest was performed following the standard protocol without IV contrast. RADIATION DOSE REDUCTION: This exam was performed according to the departmental dose-optimization program which includes automated exposure control, adjustment of the mA and/or kV according to patient size and/or use of iterative reconstruction technique. COMPARISON:  01/17/2022 FINDINGS: Cardiovascular: Extensive multi-vessel coronary artery calcification. Moderate cardiomegaly is again identified with biventricular enlargement. No pericardial effusion. The central pulmonary arteries are enlarged in keeping with changes of pulmonary arterial hypertension. Mild atherosclerotic calcification within the thoracic aorta. No aortic aneurysm. Right internal jugular hemodialysis  catheter tip noted within the right atrium. Mediastinum/Nodes: Visualized thyroid is unremarkable. Shotty mediastinal adenopathy, predominantly within the left prevascular and right paratracheal region is nonspecific, possibly reactive in nature. This appears progressive since prior examination. The esophagus is unremarkable. Lungs/Pleura: Moderate left pleural effusion is present with compressive atelectasis of the left lung base, slightly enlarged since prior examination. There is, however, superimposed dense consolidation within the left lower lobe and basilar lingula in keeping with changes of acute lobar pneumonia in the appropriate clinical setting. There is chronic consolidation within the lingula medially, stable since prior examination, likely post inflammatory in nature. Stable 4 mm subpleural pulmonary nodule within the right upper lobe, axial image # 70/4, safely considered benign. No pneumothorax or pleural effusion on the right. There is extensive airway impaction within the left lower lobe and basilar lingula. No central obstructing mass. Upper Abdomen: At least moderate ascites noted within the visualized upper abdomen. Stable splenomegaly, incompletely evaluated on this examination. Musculoskeletal: No acute bone abnormality. No lytic or blastic bone lesion. IMPRESSION: 1. Dense consolidation within the left lower lobe and basilar lingula in keeping with changes of acute lobar pneumonia in the appropriate clinical  setting. Extensive airway impaction within the left lower lobe and basilar lingula. No central obstructing mass. 2. Moderate left pleural effusion with compressive atelectasis of the left lung base, slightly enlarged since prior examination. 3. Extensive multi-vessel coronary artery calcification. Moderate cardiomegaly with biventricular enlargement. 4. Morphologic changes in keeping with pulmonary arterial hypertension. 5. At least moderate ascites within the visualized upper abdomen.  Stable splenomegaly. Together, these findings may reflect the sequela of portal venous hypertension, though this is incompletely evaluated on this exam. Aortic Atherosclerosis (ICD10-I70.0). Electronically Signed   By: Helyn Numbers M.D.   On: 04/17/2023 21:26   DG Chest Port 1 View  Result Date: 04/17/2023 CLINICAL DATA:  Acute chest pain EXAM: PORTABLE CHEST 1 VIEW COMPARISON:  12/24/2022 FINDINGS: Heart is enlarged with central vascular congestion. Dense left mid and lower lung collapse/consolidation concerning for pneumonia. Suspect associated left effusion. Right lung remains clear. No pneumothorax. Trachea midline. Aorta atherosclerotic. Right IJ dialysis catheter tip SVC RA junction as before. IMPRESSION: 1. Cardiomegaly with central vascular congestion. 2. Dense left mid and lower lung collapse/consolidation concerning for pneumonia. Electronically Signed   By: Judie Petit.  Shick M.D.   On: 04/17/2023 14:38       Time spent: 50 min    Sunnie Nielsen, DO [facility] 04/19/2023, 11:15 AM    Dictation software may have been used to generate the above note. Typos may occur and escape review in typed/dictated notes. Please contact Dr Lyn Hollingshead directly for clarity if needed.  Staff may message me via secure chat in Epic  but this may not receive an immediate response,  please page me for urgent matters!  If 7PM-7AM, please contact night coverage [email or web address]

## 2023-04-19 NOTE — Progress Notes (Signed)
[facility]  PROGRESS NOTE   Subjective:   Patient seen sitting up in bed Alert and oriented Tolerating small meals Denies nausea or vomiting today 2L [place], room air at baseline  Objective:  Vital signs: Blood pressure 106/72, pulse 100, temperature 99.3 F (37.4 C), temperature source Oral, resp. rate (!) 26, height 5\' 3"  (1.6 m), weight 41.6 kg, last menstrual period 05/09/2019, SpO2 92%.  Intake/Output Summary (Last 24 hours) at 04/19/2023 1444 Last data filed at 04/18/2023 2000 Gross per 24 hour  Intake 240 ml  Output --  Net 240 ml   Filed Weights   04/17/23 1656 04/17/23 1946 04/17/23 2128  Weight: 55 kg 53 kg 41.6 kg     Physical Exam: General:  No acute distress  Head:  Normocephalic, atraumatic. Moist oral mucosal membranes  Eyes:  Anicteric  Lungs:   Clear to auscultation, normal effort  Heart:  S1S2 no rubs  Abdomen:   Soft, nontender, bowel sounds present  Extremities:  No peripheral edema.  Neurologic:  Awake, alert, following commands  Skin:  No lesions  Access: Rt permcath    Basic Metabolic Panel: Recent Labs  Lab 04/17/23 1423 04/17/23 2207 04/18/23 0032 04/18/23 0215 04/19/23 0409  NA 128* 129*  --  128* 125*  K 6.3* 3.3*  --  4.3 3.9  CL 96* 94*  --  94* 90*  CO2 21* 26  --  26 24  GLUCOSE 97 125* 52* 119* 100*  BUN 35* 18  --  21* 39*  CREATININE 6.13* 3.65*  --  4.11* 5.43*  CALCIUM 8.2* 7.6*  --  7.7* 7.9*   GFR: Estimated Creatinine Clearance: 8.5 mL/min (A) (by C-G formula based on SCr of 5.43 mg/dL (H)).  Liver Function Tests: Recent Labs  Lab 04/17/23 1423 04/17/23 2207  AST 33 37  ALT 19 16  ALKPHOS 79 68  BILITOT 1.7* 1.7*  PROT 8.9* 8.1  ALBUMIN 2.8* 2.5*   No results for input(s): "LIPASE", "AMYLASE" in the last 168 hours. No results for input(s): "AMMONIA" in the last 168 hours.  CBC: Recent Labs  Lab 04/17/23 1423 04/18/23 0215 04/19/23 0409  WBC 12.2* 10.1 5.1  HGB 10.0* 9.7* 8.6*  HCT  32.3* 30.2* 26.8*  MCV 108.4* 104.9* 103.9*  PLT 127* 74* 68*     HbA1C: Hgb A1c MFr Bld  Date/Time Value Ref Range Status  01/26/2019 04:49 PM 5.5 4.8 - 5.6 % Final    Comment:    (NOTE) Pre diabetes:          5.7%-6.4% Diabetes:              >6.4% Glycemic control for   <7.0% adults with diabetes     Urinalysis: No results for input(s): "COLORURINE", "LABSPEC", "PHURINE", "GLUCOSEU", "HGBUR", "BILIRUBINUR", "KETONESUR", "PROTEINUR", "UROBILINOGEN", "NITRITE", "LEUKOCYTESUR" in the last 72 hours.  Invalid input(s): "APPERANCEUR"    Imaging: CT CHEST WO CONTRAST  Result Date: 04/17/2023 CLINICAL DATA:  Pneumonia, complication suspected, xray done EXAM: CT CHEST WITHOUT CONTRAST TECHNIQUE: Multidetector CT imaging of the chest was performed following the standard protocol without IV contrast. RADIATION DOSE REDUCTION: This exam was performed according to the departmental dose-optimization program which includes automated exposure control, adjustment of the mA and/or kV according to patient size and/or use of iterative reconstruction technique. COMPARISON:  01/17/2022 FINDINGS: Cardiovascular: Extensive multi-vessel coronary artery calcification. Moderate cardiomegaly is again identified with biventricular enlargement. No pericardial effusion. The central pulmonary arteries are enlarged in keeping with changes  of pulmonary arterial hypertension. Mild atherosclerotic calcification within the thoracic aorta. No aortic aneurysm. Right internal jugular hemodialysis catheter tip noted within the right atrium. Mediastinum/Nodes: Visualized thyroid is unremarkable. Shotty mediastinal adenopathy, predominantly within the left prevascular and right paratracheal region is nonspecific, possibly reactive in nature. This appears progressive since prior examination. The esophagus is unremarkable. Lungs/Pleura: Moderate left pleural effusion is present with compressive atelectasis of the left lung base,  slightly enlarged since prior examination. There is, however, superimposed dense consolidation within the left lower lobe and basilar lingula in keeping with changes of acute lobar pneumonia in the appropriate clinical setting. There is chronic consolidation within the lingula medially, stable since prior examination, likely post inflammatory in nature. Stable 4 mm subpleural pulmonary nodule within the right upper lobe, axial image # 70/4, safely considered benign. No pneumothorax or pleural effusion on the right. There is extensive airway impaction within the left lower lobe and basilar lingula. No central obstructing mass. Upper Abdomen: At least moderate ascites noted within the visualized upper abdomen. Stable splenomegaly, incompletely evaluated on this examination. Musculoskeletal: No acute bone abnormality. No lytic or blastic bone lesion. IMPRESSION: 1. Dense consolidation within the left lower lobe and basilar lingula in keeping with changes of acute lobar pneumonia in the appropriate clinical setting. Extensive airway impaction within the left lower lobe and basilar lingula. No central obstructing mass. 2. Moderate left pleural effusion with compressive atelectasis of the left lung base, slightly enlarged since prior examination. 3. Extensive multi-vessel coronary artery calcification. Moderate cardiomegaly with biventricular enlargement. 4. Morphologic changes in keeping with pulmonary arterial hypertension. 5. At least moderate ascites within the visualized upper abdomen. Stable splenomegaly. Together, these findings may reflect the sequela of portal venous hypertension, though this is incompletely evaluated on this exam. Aortic Atherosclerosis (ICD10-I70.0). Electronically Signed   By: Helyn Numbers M.D.   On: 04/17/2023 21:26     Medications:    azithromycin     cefTRIAXone (ROCEPHIN)  IV 2 g (04/18/23 1851)   dextrose 30 mL/hr at 04/19/23 0910   [START ON 04/20/2023] vancomycin       apixaban  5 mg Oral BID   bictegravir-emtricitabine-tenofovir AF  1 tablet Oral Daily   calcium acetate  2,001 mg Oral TID AC   Chlorhexidine Gluconate Cloth  6 each Topical Q0600   dapsone  50 mg Oral BID   gabapentin  300 mg Oral QHS   metoprolol succinate  12.5 mg Oral Daily   [START ON 04/25/2023] pantoprazole  40 mg Oral Daily   torsemide  40 mg Oral Daily    Assessment/ Plan:     47 year old female with history of hypertension, coronary artery disease, congestive heart failure, HIV, cirrhosis of the liver, end-stage renal disease on dialysis now admitted with history of shortness of breath, cough, fever. She is found to have fluid overload and also possible pneumonia. She is on a Tuesday Thursday Saturday schedule for dialysis.   [facility]/TTS/right chest PermCath   #1: ESRD on hemodialysis: Dialysis received yesterday evening, UF 2L achieved. Next treatment scheduled for Tuesday.   #2: Sepsis/community-acquired pneumonia: Patient is presently on azithromycin and Rocephin.Also received a dose of Vancomycin yesterday.   #3: Hypotension: Blood pressure stable today, 106/72. Receiving metoprolol and torsemide.   #4: Secondary hyperparathyroidism: Will check PTH, calcium and phosphorus levels.  Continue calcium acetate.  #5: Anemia with chronic kidney disease: Hgb 8.6. Patient receives Mircera at outpatient clinic. May require EPO during this admission. Will continue to  monitor.     LOS: 2 [facility] 11/4/20242:44 PM

## 2023-04-19 NOTE — Progress Notes (Signed)
0800 Uneventful day. No complains of chest pain or shortness of breath.  1610 Dr. Lyn Hollingshead in to discuss plan of care and code status.Patient not interested in discussing code status with MD. 1000 Bathed self with help. Stated she felt better. 1250 Report called to [facility] on PCU. 1300 Transferred to room 247 via bed. Family in to visit.

## 2023-04-19 NOTE — Consult Note (Incomplete)
NAME: Rebecca Bridges  DOB: 07-17-1975  MRN: [id number]  Date/Time: 04/19/2023 4:50 PM  REQUESTING PROVIDER: Dr.Alexander Subjective:  REASON FOR CONSULT: pneumococcus bacteremia, with pneumonia   Rebecca Bridges is a 47 y.o. with a history of AIDS, hepatitis C, end-stage renal disease on hemodialysis, current smoker, congestive heart failure, ascites presented to the hospital on 04/17/2023 with shortness of breath and productive cough of 2 days duration.  The sputum initially was yellowish in color but then started to have blood.  She also had left-sided chest pain which was sharp and worsening with deep breath and cough.  She did not have any documented fever at home She had missed 1 day of dialysis due to being sick EMS found her to have a temperature of 102. The ED BP 113/81, pulse 136, temperature 100.8, respiratory rate 34 and sats of 100% on 3 L oxygen WBC 12.2, Hb 10, platelet 127  Past Medical History:  Diagnosis Date  . 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 CAD (coronary artery disease) 02/15/2019   a.) R/LHC 02/15/2019: mild (non-obstructive) diffuse LAD and LCx disease  . COPD (chronic obstructive pulmonary disease) (HCC)   . COVID   . Dilated cardiomyopathy (HCC)    a.) TTE 11/27/2013: EF 20%; b.) TTE 03/15/2015: EF 20%; c.) TTE 12/09/2015: EF 45%; d.) TTE 12/22/2016: EF 35%; e.) TTE 08/08/2017: EF 45-50%; f.) TTE 01/27/2019: EF 20-25%; g.) TTE 01/13/2021: EF <15%; h.) TTE 11/21/2021: EF <15%  . ESRD (end stage renal disease) on dialysis [facility])    a.) T-Th-Sat  . GERD (gastroesophageal reflux disease)   . Hepatitis C virus   . HFrEF (heart failure with reduced ejection fraction) (HCC)    a.) TTE 11/27/2013: EF 20%; b.) TTE 03/15/2015: EF 20%; c.) TTE 12/09/2015: EF 45%; d.) TTE 12/22/2016: EF 35%, G1DD; e.) TTE 08/08/2017:  EF 45-50%, G1DD; f.) TTE 01/27/2019: EF 20-25%, G1DD; g.) R/LHC 02/15/2019: mPA 25, mPCWP 13, CO 4.84, CI 3.15; h.) TTE 01/13/2021: EF <15%, sev glob HK, sep/apical AK, mod asym LVH, sev LAE, mild RAE, G2DD; I.) TTE 11/21/2021: EF <15%, G2DD  . History of noncompliance with medical treatment   . HIV (human immunodeficiency virus infection) (HCC) 2015   a.) on bictegravir-emtricitabine-tenofovir (Biktarvy)  . Hypertension   . Insomnia    a.) on Trazodone PRN  . LBBB (left bundle branch block)   . Liver disease, chronic, with cirrhosis (HCC)   . Long term current use of anticoagulant    a.) apixaban  . Pancytopenia (HCC)   . Prolonged Q-T interval on ECG   . Splenomegaly   . Tobacco abuse     Past Surgical History:  Procedure Laterality Date  . A/V FISTULAGRAM Left 03/31/2022   Procedure: A/V Fistulagram;  Surgeon: Renford Dills, MD;  Location: [facility] INVASIVE CV LAB;  Service: Cardiovascular;  Laterality: Left;  . AV FISTULA PLACEMENT Left 02/12/2023   Procedure: LEFT ARM FISTULA CREATION;  Surgeon: Nada Libman, MD;  Location: [facility] OR;  Service: Vascular;  Laterality: Left;  . CHOLECYSTECTOMY    . LOWER EXTREMITY ANGIOGRAPHY Left 01/25/2019   Procedure: Lower Extremity Angiography;  Surgeon: Renford Dills, MD;  Location: [facility] INVASIVE CV LAB;  Service: Cardiovascular;  Laterality: Left;  . RIGHT/LEFT HEART CATH AND CORONARY ANGIOGRAPHY Bilateral 02/15/2019   Procedure: RIGHT/LEFT HEART CATH AND CORONARY ANGIOGRAPHY;  Surgeon: Mariah Milling,  Tollie Pizza, MD;  Location: [facility] INVASIVE CV LAB;  Service: Cardiovascular;  Laterality: Bilateral;    Social History   Socioeconomic History  . Marital status: Single    Spouse name: Not on file  . Number of children: Not on file  . Years of education: Not on file  . Highest education level: Not on file  Occupational History  . Not on file  Tobacco Use  . Smoking status: Every Day    Current packs/day: 0.25    Types: Cigarettes  . Smokeless  tobacco: Never  . Tobacco comments:    5 cigarettes a day  Vaping Use  . Vaping status: Never Used  Substance and Sexual Activity  . Alcohol use: No  . Drug use: No  . Sexual activity: Not Currently  Other Topics Concern  . Not on file  Social History Narrative  . Not on file   Social Determinants of Health   Financial Resource Strain: Low Risk  (12/29/2022)   Received from [facility]   Overall Financial Resource Strain (CARDIA)   . Difficulty of Paying Living Expenses: Not hard at all  Food Insecurity: No Food Insecurity (04/17/2023)   Hunger Vital Sign   . Worried About Programme researcher, broadcasting/film/video in the Last Year: Never true   . Ran Out of Food in the Last Year: Never true  Transportation Needs: No Transportation Needs (04/17/2023)   PRAPARE - Transportation   . Lack of Transportation (Medical): No   . Lack of Transportation (Non-Medical): No  Physical Activity: Inactive (04/26/2019)   Exercise Vital Sign   . Days of Exercise per Week: 0 days   . Minutes of Exercise per Session: 0 min  Stress: No Stress Concern Present (04/26/2019)   [employer] of Occupational Health - Occupational Stress Questionnaire   . Feeling of Stress : Not at all  Social Connections: Moderately Isolated (04/26/2019)   Social Connection and Isolation Panel [NHANES]   . Frequency of Communication with Friends and Family: More than three times a week   . Frequency of Social Gatherings with Friends and Family: More than three times a week   . Attends Religious Services: 1 to 4 times per year   . Active Member of Clubs or Organizations: No   . Attends Banker Meetings: Never   . Marital Status: Never married  Intimate Partner Violence: Not At Risk (04/17/2023)   Humiliation, Afraid, Rape, and Kick questionnaire   . Fear of Current or Ex-Partner: No   . Emotionally Abused: No   . Physically Abused: No   . Sexually Abused: No    Family History  Problem Relation Age of  Onset  . Hypertension Mother   . Cerebral aneurysm Mother    Allergies  Allergen Reactions  . Neosporin [Bacitracin-Polymyxin B] Rash  . Sulfa Antibiotics Rash   I  Current Facility-Administered Medications  Medication Dose Route Frequency Provider Last Rate Last Admin  . apixaban (ELIQUIS) tablet 5 mg  5 mg Oral BID Mikey College T, MD   5 mg at 04/19/23 1005  . azithromycin (ZITHROMAX) 500 mg in sodium chloride 0.9 % 250 mL IVPB  500 mg Intravenous Q24H Doroteo Glassman, RPH      . bictegravir-emtricitabine-tenofovir AF (BIKTARVY) 50-200-25 MG per tablet 1 tablet  1 tablet Oral Daily Emeline General, MD   1 tablet at 04/19/23 1004  . calcium acetate (PHOSLO) capsule 2,001 mg  2,001 mg Oral TID Adonis Brook  T, MD   2,001 mg at 04/19/23 1650  . cefTRIAXone (ROCEPHIN) 2 g in sodium chloride 0.9 % 100 mL IVPB  2 g Intravenous Q24H Jene Every, MD 200 mL/hr at 04/19/23 1456 2 g at 04/19/23 1456  . Chlorhexidine Gluconate Cloth 2 % PADS 6 each  6 each Topical Q0600 Lorain Childes, MD   6 each at 04/19/23 1356  . dapsone tablet 50 mg  50 mg Oral BID Mikey College T, MD   50 mg at 04/19/23 1005  . dextrose 10 % infusion   Intravenous Continuous Manuela Schwartz, NP 30 mL/hr at 04/19/23 0910 New Bag at 04/19/23 0910  . gabapentin (NEURONTIN) capsule 300 mg  300 mg Oral QHS Mikey College T, MD   300 mg at 04/18/23 2134  . HYDROmorphone (DILAUDID) injection 0.5 mg  0.5 mg Intravenous Q4H PRN Mikey College T, MD   0.5 mg at 04/17/23 2134  . levalbuterol (XOPENEX) nebulizer solution 1.25 mg  1.25 mg Nebulization Q6H PRN Mikey College T, MD   1.25 mg at 04/18/23 1713  . metoCLOPramide (REGLAN) injection 5 mg  5 mg Intravenous Q6H PRN Mikey College T, MD      . metoprolol succinate (TOPROL-XL) 24 hr tablet 12.5 mg  12.5 mg Oral Daily Sunnie Nielsen, DO      . oxyCODONE-acetaminophen (PERCOCET/ROXICET) 5-325 MG per tablet 1 tablet  1 tablet Oral Q4H PRN Emeline General, MD   1 tablet at 04/18/23 1351  . [START  ON 04/25/2023] pantoprazole (PROTONIX) EC tablet 40 mg  40 mg Oral Daily Mikey College T, MD      . torsemide [facility]) tablet 40 mg  40 mg Oral Daily Mikey College T, MD   40 mg at 04/19/23 1005  . traZODone (DESYREL) tablet 50 mg  50 mg Oral QHS PRN Emeline General, MD   50 mg at 04/18/23 2138  . [START ON 04/20/2023] vancomycin (VANCOREADY) IVPB 500 mg/100 mL  500 mg Intravenous Q T,Th,Sa-HD Barrie Folk, RPH         Abtx:  Anti-infectives (From admission, onward)    Start     Dose/Rate Route Frequency Ordered Stop   04/20/23 1200  vancomycin (VANCOREADY) IVPB 500 mg/100 mL        500 mg 100 mL/hr over 60 Minutes Intravenous Every T-Th-Sa (Hemodialysis) 04/18/23 1501     04/19/23 1600  azithromycin (ZITHROMAX) 500 mg in sodium chloride 0.9 % 250 mL IVPB        500 mg 255 mL/hr over 60 Minutes Intravenous Every 24 hours 04/19/23 0901 04/22/23 1559   04/18/23 1600  vancomycin (VANCOCIN) IVPB 1000 mg/200 mL premix        1,000 mg 200 mL/hr over 60 Minutes Intravenous  Once 04/18/23 1501 04/18/23 2147   04/18/23 1000  bictegravir-emtricitabine-tenofovir AF (BIKTARVY) 50-200-25 MG per tablet 1 tablet        1 tablet Oral Daily 04/17/23 1526     04/18/23 1000  dapsone tablet 50 mg        50 mg Oral 2 times daily 04/17/23 1526     04/17/23 1500  cefTRIAXone (ROCEPHIN) 2 g in sodium chloride 0.9 % 100 mL IVPB        2 g 200 mL/hr over 30 Minutes Intravenous Every 24 hours 04/17/23 1455 04/22/23 1459   04/17/23 1500  azithromycin (ZITHROMAX) 500 mg in dextrose 5 % 250 mL IVPB  Status:  Discontinued        500  mg 250 mL/hr over 60 Minutes Intravenous Every 24 hours 04/17/23 1455 04/19/23 0901       REVIEW OF SYSTEMS:  Const: negative fever, negative chills, negative weight loss Eyes: negative diplopia or visual changes, negative eye pain ENT: negative coryza, negative sore throat Resp: negative cough, hemoptysis, dyspnea Cards: negative for chest pain, palpitations, lower extremity  edema GU: negative for frequency, dysuria and hematuria GI: Negative for abdominal pain, diarrhea, bleeding, constipation Skin: negative for rash and pruritus Heme: negative for easy bruising and gum/nose bleeding MS: negative for myalgias, arthralgias, back pain and muscle weakness Neurolo:negative for headaches, dizziness, vertigo, memory problems  Psych: negative for feelings of anxiety, depression  Endocrine: negative for thyroid, diabetes Allergy/Immunology- negative for any medication or food allergies   Pertinent Positives include : Objective:  VITALS:  BP (!) 112/52 (BP Location: Right Leg)   Pulse (!) 101   Temp 99.7 F (37.6 C)   Resp 18   Ht 5\' 3"  (1.6 m)   Wt 41.6 kg   LMP 05/09/2019   SpO2 100%   BMI 16.25 kg/m  LDA Foley Central line Other drainage tubes PHYSICAL EXAM:  General: Alert, cooperative, no distress, appears stated age.  Head: Normocephalic, without obvious abnormality, atraumatic. Eyes: Conjunctivae clear, anicteric sclerae. Pupils are equal ENT Nares normal. No drainage or sinus tenderness. Lips, mucosa, and tongue normal. No Thrush Neck: Supple, symmetrical, no adenopathy, thyroid: non tender no carotid bruit and no JVD. Back: No CVA tenderness. Lungs: Clear to auscultation bilaterally. No Wheezing or Rhonchi. No rales. Heart: Regular rate and rhythm, no murmur, rub or gallop. Abdomen: Soft, non-tender,not distended. Bowel sounds normal. No masses Extremities: atraumatic, no cyanosis. No edema. No clubbing Skin: No rashes or lesions. Or bruising Lymph: Cervical, supraclavicular normal. Neurologic: Grossly non-focal Pertinent Labs Lab Results CBC    Component Value Date/Time   WBC 5.1 04/19/2023 0409   RBC 2.58 (L) 04/19/2023 0409   HGB 8.6 (L) 04/19/2023 0409   HGB 13.2 05/28/2022 1344   HCT 26.8 (L) 04/19/2023 0409   HCT 36.3 05/28/2022 1344   PLT 68 (L) 04/19/2023 0409   PLT 117 (L) 05/28/2022 1344   MCV 103.9 (H) 04/19/2023 0409    MCV 102 (H) 05/28/2022 1344   MCH 33.3 04/19/2023 0409   MCHC 32.1 04/19/2023 0409   RDW 17.0 (H) 04/19/2023 0409   RDW 14.0 05/28/2022 1344   LYMPHSABS 0.6 (L) 10/04/2022 2202   LYMPHSABS 0.5 (L) 05/28/2022 1344   MONOABS 0.3 10/04/2022 2202   EOSABS 0.1 10/04/2022 2202   EOSABS 0.1 05/28/2022 1344   BASOSABS 0.0 10/04/2022 2202   BASOSABS 0.0 05/28/2022 1344       Latest Ref Rng & Units 04/19/2023    4:09 AM 04/18/2023    2:15 AM 04/18/2023   12:32 AM  CMP  Glucose 70 - 99 mg/dL 161  096  52   BUN 6 - 20 mg/dL 39  21    Creatinine 0.45 - 1.00 mg/dL 4.09  8.11    Sodium 914 - 145 mmol/L 125  128    Potassium 3.5 - 5.1 mmol/L 3.9  4.3    Chloride 98 - 111 mmol/L 90  94    CO2 22 - 32 mmol/L 24  26    Calcium 8.9 - 10.3 mg/dL 7.9  7.7        Microbiology: Recent Results (from the past 240 hour(s))  Blood culture (routine x 2)     Status: Abnormal (Preliminary  result)   Collection Time: 04/17/23  2:20 PM   Specimen: BLOOD  Result Value Ref Range Status   Specimen Description   Final    BLOOD BLOOD RIGHT ARM Performed at [facility], [address] [place], [place] [address]    Special Requests   Final    BOTTLES DRAWN AEROBIC AND ANAEROBIC Blood Culture results may not be optimal due to an excessive volume of blood received in culture bottles Performed at [facility], [address] [place], [place] [address]    Culture  Setup Time   Final    GRAM POSITIVE COCCI IN BOTH AEROBIC AND ANAEROBIC BOTTLES STREPTOCOCCUS PNEUMONIAE CRITICAL RESULT CALLED TO, READ BACK BY AND VERIFIED WITH: Regino Bellow RN @ 0400 04/18/23 [facility]    Culture (A)  Final    STREPTOCOCCUS PNEUMONIAE SUSCEPTIBILITIES TO FOLLOW Performed at [facility], [address] [place], [place], [place] [address]    Report Status PENDING  Incomplete  Blood Culture ID Panel (Reflexed)     Status: Abnormal   Collection Time: 04/17/23  2:20 PM  Result Value Ref Range Status    Enterococcus faecalis NOT DETECTED NOT DETECTED Final   Enterococcus Faecium NOT DETECTED NOT DETECTED Final   Listeria monocytogenes NOT DETECTED NOT DETECTED Final   Staphylococcus species NOT DETECTED NOT DETECTED Final   Staphylococcus aureus (BCID) NOT DETECTED NOT DETECTED Final   Staphylococcus epidermidis NOT DETECTED NOT DETECTED Final   Staphylococcus lugdunensis NOT DETECTED NOT DETECTED Final   Streptococcus species DETECTED (A) NOT DETECTED Final    Comment: CRITICAL RESULT CALLED TO, READ BACK BY AND VERIFIED WITH: NATHAN BELEU @ 0400 04/18/23 [facility]    Streptococcus agalactiae NOT DETECTED NOT DETECTED Final   Streptococcus pneumoniae DETECTED (A) NOT DETECTED Final    Comment: CRITICAL RESULT CALLED TO, READ BACK BY AND VERIFIED WITH: NATHAN BELEU @ 0400 04/18/23 [facility]    Streptococcus pyogenes NOT DETECTED NOT DETECTED Final   A.calcoaceticus-baumannii NOT DETECTED NOT DETECTED Final   Bacteroides fragilis NOT DETECTED NOT DETECTED Final   Enterobacterales NOT DETECTED NOT DETECTED Final   Enterobacter cloacae complex NOT DETECTED NOT DETECTED Final   Escherichia coli NOT DETECTED NOT DETECTED Final   Klebsiella aerogenes NOT DETECTED NOT DETECTED Final   Klebsiella oxytoca NOT DETECTED NOT DETECTED Final   Klebsiella pneumoniae NOT DETECTED NOT DETECTED Final   Proteus species NOT DETECTED NOT DETECTED Final   Salmonella species NOT DETECTED NOT DETECTED Final   Serratia marcescens NOT DETECTED NOT DETECTED Final   Haemophilus influenzae NOT DETECTED NOT DETECTED Final   Neisseria meningitidis NOT DETECTED NOT DETECTED Final   Pseudomonas aeruginosa NOT DETECTED NOT DETECTED Final   Stenotrophomonas maltophilia NOT DETECTED NOT DETECTED Final   Candida albicans NOT DETECTED NOT DETECTED Final   Candida auris NOT DETECTED NOT DETECTED Final   Candida glabrata NOT DETECTED NOT DETECTED Final   Candida krusei NOT DETECTED NOT DETECTED Final   Candida parapsilosis NOT  DETECTED NOT DETECTED Final   Candida tropicalis NOT DETECTED NOT DETECTED Final   Cryptococcus neoformans/gattii NOT DETECTED NOT DETECTED Final    Comment: Performed at [facility], [address] [place], [place] [address]  Blood culture (routine x 2)     Status: Abnormal (Preliminary result)   Collection Time: 04/17/23  2:23 PM   Specimen: BLOOD  Result Value Ref Range Status   Specimen Description   Final    BLOOD BLOOD RIGHT HAND Performed at [employer]  [facility], [address] [place], [place] [address]    Special Requests   Final    BOTTLES DRAWN AEROBIC AND ANAEROBIC Blood Culture results may not be optimal due to an excessive volume of blood received in culture bottles Performed at [facility], [address] [place], [place] [address]    Culture  Setup Time   Final    IN BOTH AEROBIC AND ANAEROBIC BOTTLES GRAM POSITIVE COCCI STREPTOCOCCUS PNEUMONIAE CRITICAL RESULT CALLED TO, READ BACK BY AND VERIFIED WITH: NATHAN BELEU @ 0400 04/18/23 [facility]    Culture STREPTOCOCCUS PNEUMONIAE (A)  Final   Report Status PENDING  Incomplete  MRSA Next Gen by PCR, Nasal     Status: None   Collection Time: 04/18/23  9:39 AM   Specimen: Nasal Mucosa; Nasal Swab  Result Value Ref Range Status   MRSA by PCR Next Gen NOT DETECTED NOT DETECTED Final    Comment: (NOTE) The GeneXpert MRSA Assay (FDA approved for NASAL specimens only), is one component of a comprehensive MRSA colonization surveillance program. It is not intended to diagnose MRSA infection nor to guide or monitor treatment for MRSA infections. Test performance is not FDA approved in patients less than 35 years old. Performed at [facility], [address] [place], [place] [address]     IMAGING RESULTS:  I have personally reviewed the films   Impression/Recommendation       ___I have personally spent  ---minutes involved in face-to-face and non-face-to-face activities for this  patient on the day of the visit. Professional time spent includes the following activities: Preparing to see the patient (review of tests), Obtaining and/or reviewing separately obtained history (admission/discharge record), Performing a medically appropriate examination and/or evaluation , Ordering medications/tests/procedures, referring and communicating with other health care professionals, Documenting clinical information in the EMR, Independently interpreting results (not separately reported), Communicating results to the patient/family/caregiver, Counseling and educating the patient/family/caregiver and Care coordination (not separately reported).    ________________________________________________ Discussed with patient, requesting provider Note:  This document was prepared using Dragon voice recognition software and may include unintentional dictation errors.

## 2023-04-20 ENCOUNTER — Inpatient Hospital Stay: Payer: 59

## 2023-04-20 DIAGNOSIS — R6520 Severe sepsis without septic shock: Secondary | ICD-10-CM | POA: Diagnosis not present

## 2023-04-20 DIAGNOSIS — I5023 Acute on chronic systolic (congestive) heart failure: Secondary | ICD-10-CM

## 2023-04-20 DIAGNOSIS — D62 Acute posthemorrhagic anemia: Secondary | ICD-10-CM

## 2023-04-20 DIAGNOSIS — A403 Sepsis due to Streptococcus pneumoniae: Secondary | ICD-10-CM | POA: Diagnosis not present

## 2023-04-20 DIAGNOSIS — J9601 Acute respiratory failure with hypoxia: Secondary | ICD-10-CM | POA: Diagnosis not present

## 2023-04-20 LAB — HELPER T-LYMPH-CD4 (ARMC ONLY)
% CD 4 Pos. Lymph.: 11.8 % — ABNORMAL LOW (ref 30.8–58.5)
Absolute CD 4 Helper: 47 /uL — ABNORMAL LOW (ref 359–1519)
Basophils Absolute: 0 10*3/uL (ref 0.0–0.2)
Basos: 0 %
EOS (ABSOLUTE): 0.1 10*3/uL (ref 0.0–0.4)
Eos: 1 %
Hematocrit: 27.9 % — ABNORMAL LOW (ref 34.0–46.6)
Hemoglobin: 9.1 g/dL — ABNORMAL LOW (ref 11.1–15.9)
Lymphocytes Absolute: 0.4 10*3/uL — ABNORMAL LOW (ref 0.7–3.1)
Lymphs: 6 %
MCH: 32.6 pg (ref 26.6–33.0)
MCHC: 32.6 g/dL (ref 31.5–35.7)
MCV: 100 fL — ABNORMAL HIGH (ref 79–97)
Monocytes Absolute: 0.3 10*3/uL (ref 0.1–0.9)
Monocytes: 4 %
Neutrophils Absolute: 5.8 10*3/uL (ref 1.4–7.0)
Neutrophils: 88 %
Platelets: 76 10*3/uL — ABNORMAL LOW (ref 150–450)
RBC: 2.79 x10E6/uL — ABNORMAL LOW (ref 3.77–5.28)
RDW: 15.7 % — ABNORMAL HIGH (ref 11.7–15.4)
WBC: 6.6 10*3/uL (ref 3.4–10.8)

## 2023-04-20 LAB — CBC
HCT: 26.5 % — ABNORMAL LOW (ref 36.0–46.0)
Hemoglobin: 8.6 g/dL — ABNORMAL LOW (ref 12.0–15.0)
MCH: 33.5 pg (ref 26.0–34.0)
MCHC: 32.5 g/dL (ref 30.0–36.0)
MCV: 103.1 fL — ABNORMAL HIGH (ref 80.0–100.0)
Platelets: 76 10*3/uL — ABNORMAL LOW (ref 150–400)
RBC: 2.57 MIL/uL — ABNORMAL LOW (ref 3.87–5.11)
RDW: 16.4 % — ABNORMAL HIGH (ref 11.5–15.5)
WBC: 5 10*3/uL (ref 4.0–10.5)
nRBC: 0 % (ref 0.0–0.2)

## 2023-04-20 LAB — PROTEIN, PLEURAL OR PERITONEAL FLUID: Total protein, fluid: 3.9 g/dL

## 2023-04-20 LAB — BASIC METABOLIC PANEL
Anion gap: 11 (ref 5–15)
BUN: 52 mg/dL — ABNORMAL HIGH (ref 6–20)
CO2: 25 mmol/L (ref 22–32)
Calcium: 7.7 mg/dL — ABNORMAL LOW (ref 8.9–10.3)
Chloride: 90 mmol/L — ABNORMAL LOW (ref 98–111)
Creatinine, Ser: 6.48 mg/dL — ABNORMAL HIGH (ref 0.44–1.00)
GFR, Estimated: 7 mL/min — ABNORMAL LOW (ref >60)
Glucose, Bld: 94 mg/dL (ref 70–99)
Potassium: 4.1 mmol/L (ref 3.5–5.1)
Sodium: 126 mmol/L — ABNORMAL LOW (ref 135–145)

## 2023-04-20 LAB — CULTURE, BLOOD (ROUTINE X 2)

## 2023-04-20 LAB — BODY FLUID CELL COUNT WITH DIFFERENTIAL
Eos, Fluid: 0 %
Lymphs, Fluid: 18 %
Monocyte-Macrophage-Serous Fluid: 19 %
Neutrophil Count, Fluid: 63 %
Total Nucleated Cell Count, Fluid: 11098 uL

## 2023-04-20 LAB — LACTATE DEHYDROGENASE, PLEURAL OR PERITONEAL FLUID: LD, Fluid: 615 U/L — ABNORMAL HIGH (ref 3–23)

## 2023-04-20 LAB — ALBUMIN, PLEURAL OR PERITONEAL FLUID: Albumin, Fluid: <1.5 g/dL

## 2023-04-20 LAB — HIV-1 RNA QUANT-NO REFLEX-BLD
HIV 1 RNA Quant: 130 {copies}/mL
LOG10 HIV-1 RNA: 2.114 {Log}

## 2023-04-20 LAB — AMYLASE, PLEURAL OR PERITONEAL FLUID: Amylase, Fluid: 79 U/L

## 2023-04-20 LAB — IMMATURE CELLS: Metamyelocytes: 1 % — ABNORMAL HIGH (ref 0–0)

## 2023-04-20 LAB — GLUCOSE, PLEURAL OR PERITONEAL FLUID: Glucose, Fluid: 63 mg/dL

## 2023-04-20 LAB — LACTATE DEHYDROGENASE: LDH: 146 U/L (ref 98–192)

## 2023-04-20 LAB — HEPATITIS B SURFACE ANTIBODY, QUANTITATIVE: Hep B S AB Quant (Post): <3.5 m[IU]/mL — ABNORMAL LOW

## 2023-04-20 MED ORDER — HEPARIN SODIUM (PORCINE) 1000 UNIT/ML DIALYSIS: 1000.0000 [IU] | INTRAMUSCULAR | Status: DC | PRN

## 2023-04-20 MED ORDER — LIDOCAINE HCL (PF) 1 % IJ SOLN
10.0000 mL | Freq: Once | INTRAMUSCULAR | Status: AC
  Administered 2023-04-20: 10 mL via INTRADERMAL
  Filled 2023-04-20: qty 10

## 2023-04-20 MED ORDER — GUAIFENESIN-DM 100-10 MG/5ML PO SYRP
5.0000 mL | ORAL_SOLUTION | ORAL | Status: DC | PRN
  Administered 2023-04-21: 5 mL via ORAL
  Filled 2023-04-20: qty 10

## 2023-04-20 MED ORDER — ALTEPLASE 2 MG IJ SOLR: 2.0000 mg | Freq: Once | INTRAMUSCULAR | Status: DC | PRN

## 2023-04-20 MED ORDER — APIXABAN 5 MG PO TABS
5.0000 mg | ORAL_TABLET | Freq: Two times a day (BID) | ORAL | Status: DC
  Administered 2023-04-20 – 2023-04-21 (×3): 5 mg via ORAL
  Filled 2023-04-20 (×3): qty 1

## 2023-04-20 NOTE — Progress Notes (Signed)
Date of Admission:  04/17/2023   Total days of antibiotics ***        Day ***        Day ***        Day ***   ID: Rebecca Bridges is a 47 y.o. female with  *** Principal Problem:   Sepsis (HCC)    Subjective: ***  Medications:   apixaban  5 mg Oral BID   bictegravir-emtricitabine-tenofovir AF  1 tablet Oral Daily   calcium acetate  2,001 mg Oral TID AC   Chlorhexidine Gluconate Cloth  6 each Topical Q0600   dapsone  50 mg Oral BID   gabapentin  300 mg Oral QHS   metoprolol succinate  12.5 mg Oral Daily   [START ON 04/25/2023] pantoprazole  40 mg Oral Daily   torsemide  40 mg Oral Daily    Objective: Vital signs in last 24 hours: Patient Vitals for the past 24 hrs:  BP Temp Temp src Pulse Resp SpO2  04/20/23 1733 104/71 97.8 F (36.6 C) Oral 92 15 99 %  04/20/23 1422 122/79 -- -- 92 -- 100 %  04/20/23 1214 127/81 -- -- 97 -- 99 %  04/20/23 1159 103/75 -- -- 94 15 100 %  04/20/23 1148 -- -- -- -- 14 --  04/20/23 0907 104/83 98 F (36.7 C) Oral 92 15 100 %  04/20/23 0501 104/68 97.7 F (36.5 C) -- 86 18 100 %  04/19/23 2327 92/62 98.1 F (36.7 C) -- 88 18 100 %     LDA Foley Central lines Other catheters  PHYSICAL EXAM:  General: Alert, cooperative, no distress, appears stated age.  Head: Normocephalic, without obvious abnormality, atraumatic. Eyes: Conjunctivae clear, anicteric sclerae. Pupils are equal ENT Nares normal. No drainage or sinus tenderness. Lips, mucosa, and tongue normal. No Thrush Neck: Supple, symmetrical, no adenopathy, thyroid: non tender no carotid bruit and no JVD. Back: No CVA tenderness. Lungs: Clear to auscultation bilaterally. No Wheezing or Rhonchi. No rales. Heart: Regular rate and rhythm, no murmur, rub or gallop. Abdomen: Soft, non-tender,not distended. Bowel sounds normal. No masses Extremities: atraumatic, no cyanosis. No edema. No clubbing Skin: No rashes or lesions. Or bruising Lymph: Cervical, supraclavicular  normal. Neurologic: Grossly non-focal  Lab Results    Latest Ref Rng & Units 04/20/2023    4:43 AM 04/19/2023    4:09 AM 04/18/2023    3:16 PM  CBC  WBC 4.0 - 10.5 K/uL 5.0  5.1  6.6   Hemoglobin 12.0 - 15.0 g/dL 8.6  8.6  9.1   Hematocrit 36.0 - 46.0 % 26.5  26.8  27.9   Platelets 150 - 400 K/uL 76  68  76        Latest Ref Rng & Units 04/20/2023    4:43 AM 04/19/2023    4:09 AM 04/18/2023    2:15 AM  CMP  Glucose 70 - 99 mg/dL 94  413  244   BUN 6 - 20 mg/dL 52  39  21   Creatinine 0.44 - 1.00 mg/dL 0.10  2.72  5.36   Sodium 135 - 145 mmol/L 126  125  128   Potassium 3.5 - 5.1 mmol/L 4.1  3.9  4.3   Chloride 98 - 111 mmol/L 90  90  94   CO2 22 - 32 mmol/L 25  24  26    Calcium 8.9 - 10.3 mg/dL 7.7  7.9  7.7       Microbiology:  Studies/Results:  DG Chest Port 1 View  Result Date: 04/20/2023 CLINICAL DATA:  Status post left thoracentesis. EXAM: PORTABLE CHEST 1 VIEW COMPARISON:  April 17, 2023. FINDINGS: Stable cardiomediastinal silhouette. No pneumothorax is noted status post left thoracentesis. Moderate left pleural effusion remains. IMPRESSION: No pneumothorax status post left thoracentesis. Electronically Signed   By: Lupita Raider M.D.   On: 04/20/2023 15:18   US THORACENTESIS ASP PLEURAL SPACE W/IMG GUIDE  Result Date: 04/20/2023 INDICATION: 47 year old female. History of congestive heart failure. End-stage renal disease on hemodialysis. Presents to the ED with shortness of breath found to have a left-sided pleural effusion request is for therapeutic and diagnostic left-sided thoracentesis EXAM: ULTRASOUND GUIDED LEFT THORACENTESIS MEDICATIONS: Lidocaine 1% 10 mL COMPLICATIONS: None immediate. PROCEDURE: An ultrasound guided thoracentesis was thoroughly discussed with the patient and questions answered. The benefits, risks, alternatives and complications were also discussed. The patient understands and wishes to proceed with the procedure. Written consent was obtained.  Ultrasound was performed to localize and mark an adequate pocket of fluid in the left chest. The area was then prepped and draped in the normal sterile fashion. 1% Lidocaine was used for local anesthesia. Under ultrasound guidance a 6 Fr Safe-T-Centesis catheter was introduced. Thoracentesis was performed. Per patient request the procedure was aborted after 600 mL of fluid was removed. The catheter was removed and a dressing applied. FINDINGS: A total of approximately 600 mL of amber color fluid was removed. Samples were sent to the laboratory as requested by the clinical team. IMPRESSION: Successful ultrasound guided therapeutic and diagnostic left-sided thoracentesis yielding 600 mL of pleural fluid. Performed by Anders Grant NP Electronically Signed   By: Olive Bass M.D.   On: 04/20/2023 13:26   ECHOCARDIOGRAM COMPLETE  Result Date: 04/19/2023    ECHOCARDIOGRAM REPORT   Patient Name:   Rebecca Bridges Date of Exam: 04/19/2023 Medical Rec #:  [id number]  Height:       63.0 in Accession #:    [id number] Weight:       91.7 lb Date of Birth:  November 25, 1975   BSA:          1.388 m Patient Age:    47 years   BP:           83/60 mmHg Patient Gender: F          HR:           88 bpm. Exam Location:  [facility] Procedure: 2D Echo, 3D Echo, Cardiac Doppler, Color Doppler and Strain Analysis Indications:     CHF, Dilated Cardiomyopathy  History:         Patient has prior history of Echocardiogram examinations, most                  recent 01/27/2019. CHF and Cardiomyopathy, COPD,                  Arrythmias:LBBB; Risk Factors:Hypertension and Current Smoker.                  CKD, ESRD on Dialysis.  Sonographer:     Mikki Harbor Referring Phys:  [id number] Sunnie Nielsen Diagnosing Phys: Lorine Bears MD  Sonographer Comments: Global longitudinal strain was attempted. IMPRESSIONS  1. Left ventricular ejection fraction, by estimation, is 20 to 25%. The left ventricle has severely decreased function. The left ventricle  demonstrates global hypokinesis. The left ventricular internal cavity size was severely dilated. Left ventricular diastolic parameters are indeterminate. The average left ventricular global longitudinal strain  is -6.6 %. The global longitudinal strain is abnormal.  2. Right ventricular systolic function is moderately reduced. The right ventricular size is mildly enlarged. There is mildly elevated pulmonary artery systolic pressure.  3. Left atrial size was moderately dilated.  4. Right atrial size was moderately dilated.  5. The mitral valve is normal in structure. Moderate mitral valve regurgitation. No evidence of mitral stenosis.  6. Tricuspid valve regurgitation is moderate to severe.  7. The aortic valve is normal in structure. Aortic valve regurgitation is not visualized. No aortic stenosis is present.  8. Moderately dilated pulmonary artery.  9. The inferior vena cava is dilated in size with <50% respiratory variability, suggesting right atrial pressure of 15 mmHg. FINDINGS  Left Ventricle: Left ventricular ejection fraction, by estimation, is 20 to 25%. The left ventricle has severely decreased function. The left ventricle demonstrates global hypokinesis. The average left ventricular global longitudinal strain is -6.6 %. The global longitudinal strain is abnormal. The left ventricular internal cavity size was severely dilated. There is no left ventricular hypertrophy. Abnormal (paradoxical) septal motion, consistent with left bundle branch block. Left ventricular diastolic parameters are indeterminate. Right Ventricle: The right ventricular size is mildly enlarged. No increase in right ventricular wall thickness. Right ventricular systolic function is moderately reduced. There is mildly elevated pulmonary artery systolic pressure. The tricuspid regurgitant velocity is 2.66 m/s, and with an assumed right atrial pressure of 15 mmHg, the estimated right ventricular systolic pressure is 43.3 mmHg. Left Atrium:  Left atrial size was moderately dilated. Right Atrium: Right atrial size was moderately dilated. Pericardium: There is no evidence of pericardial effusion. Mitral Valve: The mitral valve is normal in structure. Moderate mitral valve regurgitation. No evidence of mitral valve stenosis. MV peak gradient, 6.8 mmHg. The mean mitral valve gradient is 3.0 mmHg. Tricuspid Valve: The tricuspid valve is normal in structure. Tricuspid valve regurgitation is moderate to severe. No evidence of tricuspid stenosis. Aortic Valve: The aortic valve is normal in structure. Aortic valve regurgitation is not visualized. No aortic stenosis is present. Aortic valve mean gradient measures 4.0 mmHg. Aortic valve peak gradient measures 8.6 mmHg. Aortic valve area, by VTI measures 2.07 cm. Pulmonic Valve: The pulmonic valve was normal in structure. Pulmonic valve regurgitation is mild. No evidence of pulmonic stenosis. Aorta: The aortic root is normal in size and structure. Pulmonary Artery: The pulmonary artery is moderately dilated. Venous: The inferior vena cava is dilated in size with less than 50% respiratory variability, suggesting right atrial pressure of 15 mmHg. IAS/Shunts: No atrial level shunt detected by color flow Doppler. Additional Comments: There is a small pleural effusion in the left lateral region.  LEFT VENTRICLE PLAX 2D LVIDd:         6.70 cm      Diastology LVIDs:         5.80 cm      LV e' medial:    8.70 cm/s LV PW:         1.30 cm      LV E/e' medial:  11.4 LV IVS:        1.10 cm      LV e' lateral:   9.14 cm/s LVOT diam:     1.90 cm      LV E/e' lateral: 10.9 LV SV:         46 LV SV Index:   33           2D Longitudinal Strain LVOT Area:  2.84 cm     2D Strain GLS Avg:     -6.6 %  LV Volumes (MOD) LV vol d, MOD A2C: 207.0 ml LV vol d, MOD A4C: 147.0 ml LV vol s, MOD A2C: 154.0 ml LV vol s, MOD A4C: 111.0 ml LV SV MOD A2C:     53.0 ml LV SV MOD A4C:     147.0 ml LV SV MOD BP:      43.4 ml RIGHT VENTRICLE RV  Basal diam:  4.15 cm RV Mid diam:    3.60 cm RV S prime:     10.40 cm/s TAPSE (M-mode): 1.7 cm LEFT ATRIUM             Index        RIGHT ATRIUM           Index LA diam:        4.70 cm 3.39 cm/m   RA Area:     25.40 cm LA Vol (A2C):   77.9 ml 56.11 ml/m  RA Volume:   89.40 ml  64.40 ml/m LA Vol (A4C):   61.3 ml 44.16 ml/m LA Biplane Vol: 73.4 ml 52.87 ml/m  AORTIC VALVE                    PULMONIC VALVE AV Area (Vmax):    2.28 cm     PV Vmax:       1.07 m/s AV Area (Vmean):   1.99 cm     PV Peak grad:  4.6 mmHg AV Area (VTI):     2.07 cm AV Vmax:           147.00 cm/s AV Vmean:          92.000 cm/s AV VTI:            0.223 m AV Peak Grad:      8.6 mmHg AV Mean Grad:      4.0 mmHg LVOT Vmax:         118.00 cm/s LVOT Vmean:        64.500 cm/s LVOT VTI:          0.163 m LVOT/AV VTI ratio: 0.73  AORTA Ao Root diam: 3.20 cm Ao Asc diam:  2.80 cm MITRAL VALVE                  TRICUSPID VALVE MV Area (PHT): 6.96 cm       TR Peak grad:   28.3 mmHg MV Area VTI:   2.19 cm       TR Vmax:        266.00 cm/s MV Peak grad:  6.8 mmHg MV Mean grad:  3.0 mmHg       SHUNTS MV Vmax:       1.30 m/s       Systemic VTI:  0.16 m MV Vmean:      77.1 cm/s      Systemic Diam: 1.90 cm MV Decel Time: 109 msec MR Peak grad:    65.4 mmHg MR Mean grad:    38.5 mmHg MR Vmax:         404.50 cm/s MR Vmean:        287.0 cm/s MR PISA:         1.57 cm MR PISA Eff ROA: 36 mm MR PISA Radius:  0.50 cm MV E velocity: 99.60 cm/s MV A velocity: 89.60 cm/s MV E/A ratio:  1.11 Lorine Bears MD Electronically signed by Jerolyn Center  Kirke Corin MD Signature Date/Time: 04/19/2023/5:25:46 PM    Final      Assessment/Plan: ***

## 2023-04-20 NOTE — Plan of Care (Signed)

## 2023-04-20 NOTE — Progress Notes (Signed)
PROGRESS NOTE    Rebecca Bridges   ZOX:[id number] DOB: 1976-01-24  DOA: 04/17/2023 Date of Service: 04/20/23 which is hospital day 3  PCP: [facility]    HPI: [name] is a 47 y.o. female with medical history significant of ESRD on HD TTS, dilated cardiomyopathy with chronic combined HFrEF and HFpEF with LVEF <15% on echo 2023, nonobstructive CAD, COPD, cirrhosis requiring paracentesis, PE on Eliquis, HIV (CD4=80, Oct/2024) on antiviral treatment and dapsone, HCV presented with worsening of productive cough shortness of breath and left-sided chest pain. Onset 2d prior, progressed to productive cough w/ blood tinged mucus, L chest pain. EMS called. Febrile, tachycardic, SpO2 87% RA.   Hospital course / significant events:  11/02: to ED, CXR (+)L PNA and pleural effusion, admitted to hospitalist service for sepsis  11/03: (+)BCx strep pneumo. Echo ordered, likely will end up needing TEE, will continue abx and involve ID, chest pain PM and no EKG changes and troponin elevated but downtrending 11/04: ID consult pending, echo done and read is pending, BP stable, no further chest pain 11/05: echo - EF stable 20-25% severe hypokinesis/strain. Thoracentesis pending  Consultants:  Infectious disease Advanced heart failure   Procedures/Surgeries: none      ASSESSMENT & PLAN:   Severe sepsis d/t CAP, possible empyema  Strep pneumo bacteremia  fever, tachycardia, with signs of acute organ damage acute hyperkalemia, hypoxia DDx, CD4<200, patient reports has been compliant with dapsone.  Low suspicious for PCP Continue ceftriaxone per ID  Culture sputum Thoracentesis pending r/o empyema  ID to follow   Hemoptysis - resolved  Suspect this is from CAP, treat as above   Pleuritic chest pain not c/w angina - recurrent through hospital stay  Clinically suspect d/t  pleural effusion Other DDx, history of nonobstructive CAD, low suspicion for ACS.  On chronic Eliquis, low  suspicion for PE. EKG/troponin as needed    Acute on chronic combined HFrEF and HFpEF decompensation Has significant signs of fluid overload. Emergency HD on admission, improved some after this, continue dialysis  Last echo 11/2021, repeat this admission stable  Likely will need advanced HF consult if not improving w/ dialysis    HIV Low CD4 count 03/16/23 Continue Biktarvy triple antiretroviral - not adherent to his outpatient  CD4, HIV viral load  ID following  Continue Dapsone for PCP Ppx w/ low CD4 (sulfa allergy = no Bactrim)  ESRD Hyperkalemia No significant EKG T wave changes,  Non-anion gap metabolic acidosis One dose of  IV lasix emergency HD   Cirrhosis HCV Appears to be stable, no significant ascites signs monitor   Chronic pancytopenia H&H stable, platelet level stable. Monitor CBC   Hx of PE Continue Eliquis   Advanced care planning Pt and I discussed presence of her multiple serious comorbid conditions particularly the cardiac illnesses  Would anticipate very poor prognosis if she were to experience cardiac arrest Would probably recommend against CPR, would consider intubation if respiratory failure  Echo as above  Appreciate cardiology opinion re: prognosis given CHF, recs whether CPR should be offered in case of cardiac decompensation       Underweight and moderate protein calorie malnutrition based on BMI: Body mass index is 16.25 kg/m.  Underweight - under 18.5  normal weight - 18.5 to 24.9 overweight - 25 to 29.9 obese - 30 or more   DVT prophylaxis: ELiquis  IV fluids: no continuous IV fluids Nutrition: cardiac Central lines / invasive devices: non  Code Status: FULL CODE  ACP documentation reviewed:  none on file in [facility]  TOC needs: none at this time Barriers to dispo / significant pending items: bacteremic, expect will be here some time on IV abx and need ID to see              Subjective / Brief ROS:  Patient reports  feeling better this morning, still tired  Still having occasional chest pain maybe once a day Pain controlled otherwise  Denies new weakness.  Tolerating diet.   Family Communication: pt declined call to family    Objective Findings:  Vitals:   04/20/23 1148 04/20/23 1159 04/20/23 1214 04/20/23 1422  BP:  103/75 127/81 122/79  Pulse:  94 97 92  Resp: 14 15    Temp:      TempSrc:      SpO2:  100% 99% 100%  Weight:      Height:        Intake/Output Summary (Last 24 hours) at 04/20/2023 1455 Last data filed at 04/19/2023 1700 Gross per 24 hour  Intake 120 ml  Output --  Net 120 ml   Filed Weights   04/17/23 1656 04/17/23 1946 04/17/23 2128  Weight: 55 kg 53 kg 41.6 kg    Examination:  Physical Exam Constitutional:      General: She is not in acute distress. Cardiovascular:     Rate and Rhythm: Normal rate and regular rhythm.     Heart sounds: Heart sounds are distant.  Pulmonary:     Effort: Pulmonary effort is normal.     Breath sounds: Examination of the right-middle field reveals decreased breath sounds. Examination of the right-lower field reveals decreased breath sounds. Decreased breath sounds present.  Abdominal:     Palpations: Abdomen is soft.  Neurological:     General: No focal deficit present.     Mental Status: She is alert and oriented to person, place, and time.  Psychiatric:        Mood and Affect: Mood normal.        Behavior: Behavior normal.          Scheduled Medications:   apixaban  5 mg Oral BID   bictegravir-emtricitabine-tenofovir AF  1 tablet Oral Daily   calcium acetate  2,001 mg Oral TID AC   Chlorhexidine Gluconate Cloth  6 each Topical Q0600   dapsone  50 mg Oral BID   gabapentin  300 mg Oral QHS   metoprolol succinate  12.5 mg Oral Daily   [START ON 04/25/2023] pantoprazole  40 mg Oral Daily   torsemide  40 mg Oral Daily    Continuous Infusions:  cefTRIAXone (ROCEPHIN)  IV 2 g (04/19/23 1456)    PRN Medications:   alteplase, heparin, HYDROmorphone (DILAUDID) injection, levalbuterol, metoCLOPramide (REGLAN) injection, oxyCODONE-acetaminophen, traZODone  Antimicrobials from admission:  Anti-infectives (From admission, onward)    Start     Dose/Rate Route Frequency Ordered Stop   04/20/23 1200  vancomycin (VANCOREADY) IVPB 500 mg/100 mL  Status:  Discontinued        500 mg 100 mL/hr over 60 Minutes Intravenous Every T-Th-Sa (Hemodialysis) 04/18/23 1501 04/19/23 1653   04/19/23 1600  azithromycin (ZITHROMAX) 500 mg in sodium chloride 0.9 % 250 mL IVPB  Status:  Discontinued        500 mg 255 mL/hr over 60 Minutes Intravenous Every 24 hours 04/19/23 0901 04/19/23 1653   04/18/23 1600  vancomycin (VANCOCIN) IVPB 1000 mg/200 mL premix        1,000  mg 200 mL/hr over 60 Minutes Intravenous  Once 04/18/23 1501 04/18/23 2147   04/18/23 1000  bictegravir-emtricitabine-tenofovir AF (BIKTARVY) 50-200-25 MG per tablet 1 tablet        1 tablet Oral Daily 04/17/23 1526     04/18/23 1000  dapsone tablet 50 mg        50 mg Oral 2 times daily 04/17/23 1526     04/17/23 1500  cefTRIAXone (ROCEPHIN) 2 g in sodium chloride 0.9 % 100 mL IVPB        2 g 200 mL/hr over 30 Minutes Intravenous Every 24 hours 04/17/23 1455 04/22/23 1459   04/17/23 1500  azithromycin (ZITHROMAX) 500 mg in dextrose 5 % 250 mL IVPB  Status:  Discontinued        500 mg 250 mL/hr over 60 Minutes Intravenous Every 24 hours 04/17/23 1455 04/19/23 0901           Data Reviewed:  I have personally reviewed the following...  CBC: Recent Labs  Lab 04/17/23 1423 04/18/23 0215 04/18/23 1516 04/19/23 0409 04/20/23 0443  WBC 12.2* 10.1 6.6 5.1 5.0  NEUTROABS  --   --  5.8  --   --   HGB 10.0* 9.7* 9.1* 8.6* 8.6*  HCT 32.3* 30.2* 27.9* 26.8* 26.5*  MCV 108.4* 104.9* 100* 103.9* 103.1*  PLT 127* 74* 76* 68* 76*   Basic Metabolic Panel: Recent Labs  Lab 04/17/23 1423 04/17/23 2207 04/18/23 0032 04/18/23 0215 04/19/23 0409  04/20/23 0443  NA 128* 129*  --  128* 125* 126*  K 6.3* 3.3*  --  4.3 3.9 4.1  CL 96* 94*  --  94* 90* 90*  CO2 21* 26  --  26 24 25   GLUCOSE 97 125* 52* 119* 100* 94  BUN 35* 18  --  21* 39* 52*  CREATININE 6.13* 3.65*  --  4.11* 5.43* 6.48*  CALCIUM 8.2* 7.6*  --  7.7* 7.9* 7.7*   GFR: Estimated Creatinine Clearance: 7.1 mL/min (A) (by C-G formula based on SCr of 6.48 mg/dL (H)). Liver Function Tests: Recent Labs  Lab 04/17/23 1423 04/17/23 2207  AST 33 37  ALT 19 16  ALKPHOS 79 68  BILITOT 1.7* 1.7*  PROT 8.9* 8.1  ALBUMIN 2.8* 2.5*   No results for input(s): "LIPASE", "AMYLASE" in the last 168 hours. No results for input(s): "AMMONIA" in the last 168 hours. Coagulation Profile: No results for input(s): "INR", "PROTIME" in the last 168 hours. Cardiac Enzymes: No results for input(s): "CKTOTAL", "CKMB", "CKMBINDEX", "TROPONINI" in the last 168 hours. BNP (last 3 results) No results for input(s): "PROBNP" in the last 8760 hours. HbA1C: No results for input(s): "HGBA1C" in the last 72 hours. CBG: Recent Labs  Lab 04/19/23 0524 04/19/23 0648 04/19/23 0758 04/19/23 1138 04/19/23 1554  GLUCAP 102* 96 81 97 96   Lipid Profile: No results for input(s): "CHOL", "HDL", "LDLCALC", "TRIG", "CHOLHDL", "LDLDIRECT" in the last 72 hours. Thyroid Function Tests: Recent Labs    04/18/23 0215  TSH 1.496   Anemia Panel: No results for input(s): "VITAMINB12", "FOLATE", "FERRITIN", "TIBC", "IRON", "RETICCTPCT" in the last 72 hours. Most Recent Urinalysis On File:     Component Value Date/Time   COLORURINE YELLOW (A) 06/13/2019 1150   APPEARANCEUR HAZY (A) 06/13/2019 1150   LABSPEC 1.010 06/13/2019 1150   PHURINE 6.0 06/13/2019 1150   GLUCOSEU NEGATIVE 06/13/2019 1150   HGBUR NEGATIVE 06/13/2019 1150   BILIRUBINUR NEGATIVE 06/13/2019 1150   KETONESUR NEGATIVE 06/13/2019 1150  PROTEINUR >=300 (A) 06/13/2019 1150   NITRITE NEGATIVE 06/13/2019 1150   LEUKOCYTESUR SMALL  (A) 06/13/2019 1150   Sepsis Labs: @LABRCNTIP (procalcitonin:4,lacticidven:4) Microbiology: Recent Results (from the past 240 hour(s))  Blood culture (routine x 2)     Status: Abnormal   Collection Time: 04/17/23  2:20 PM   Specimen: BLOOD  Result Value Ref Range Status   Specimen Description   Final    BLOOD BLOOD RIGHT ARM Performed at [facility], [address], [place], [place] [address]    Special Requests   Final    BOTTLES DRAWN AEROBIC AND ANAEROBIC Blood Culture results may not be optimal due to an excessive volume of blood received in culture bottles Performed at [facility], [address] [place], [place] [address]    Culture  Setup Time   Final    GRAM POSITIVE COCCI IN BOTH AEROBIC AND ANAEROBIC BOTTLES STREPTOCOCCUS PNEUMONIAE CRITICAL RESULT CALLED TO, READ BACK BY AND VERIFIED WITH: Regino Bellow RN @ 0400 04/18/23 [facility]    Culture STREPTOCOCCUS PNEUMONIAE (A)  Final   Report Status 04/20/2023 FINAL  Final   Organism ID, Bacteria STREPTOCOCCUS PNEUMONIAE  Final      Susceptibility   Streptococcus pneumoniae - MIC*    ERYTHROMYCIN <=0.12 SENSITIVE Sensitive     LEVOFLOXACIN 0.5 SENSITIVE Sensitive     VANCOMYCIN 0.5 SENSITIVE Sensitive     PENICILLIN (meningitis) <=0.06 SENSITIVE Sensitive     PENO - penicillin <=0.06      PENICILLIN (non-meningitis) <=0.06 SENSITIVE Sensitive     PENICILLIN (oral) <=0.06 SENSITIVE Sensitive     CEFTRIAXONE (non-meningitis) <=0.12 SENSITIVE Sensitive     CEFTRIAXONE (meningitis) <=0.12 SENSITIVE Sensitive     * STREPTOCOCCUS PNEUMONIAE  Blood Culture ID Panel (Reflexed)     Status: Abnormal   Collection Time: 04/17/23  2:20 PM  Result Value Ref Range Status   Enterococcus faecalis NOT DETECTED NOT DETECTED Final   Enterococcus Faecium NOT DETECTED NOT DETECTED Final   Listeria monocytogenes NOT DETECTED NOT DETECTED Final   Staphylococcus species NOT DETECTED NOT DETECTED Final   Staphylococcus aureus  (BCID) NOT DETECTED NOT DETECTED Final   Staphylococcus epidermidis NOT DETECTED NOT DETECTED Final   Staphylococcus lugdunensis NOT DETECTED NOT DETECTED Final   Streptococcus species DETECTED (A) NOT DETECTED Final    Comment: CRITICAL RESULT CALLED TO, READ BACK BY AND VERIFIED WITH: NATHAN BELEU @ 0400 04/18/23 [facility]    Streptococcus agalactiae NOT DETECTED NOT DETECTED Final   Streptococcus pneumoniae DETECTED (A) NOT DETECTED Final    Comment: CRITICAL RESULT CALLED TO, READ BACK BY AND VERIFIED WITH: NATHAN BELEU @ 0400 04/18/23 [facility]    Streptococcus pyogenes NOT DETECTED NOT DETECTED Final   A.calcoaceticus-baumannii NOT DETECTED NOT DETECTED Final   Bacteroides fragilis NOT DETECTED NOT DETECTED Final   Enterobacterales NOT DETECTED NOT DETECTED Final   Enterobacter cloacae complex NOT DETECTED NOT DETECTED Final   Escherichia coli NOT DETECTED NOT DETECTED Final   Klebsiella aerogenes NOT DETECTED NOT DETECTED Final   Klebsiella oxytoca NOT DETECTED NOT DETECTED Final   Klebsiella pneumoniae NOT DETECTED NOT DETECTED Final   Proteus species NOT DETECTED NOT DETECTED Final   Salmonella species NOT DETECTED NOT DETECTED Final   Serratia marcescens NOT DETECTED NOT DETECTED Final   Haemophilus influenzae NOT DETECTED NOT DETECTED Final   Neisseria meningitidis NOT DETECTED NOT DETECTED Final   Pseudomonas aeruginosa NOT DETECTED NOT DETECTED Final   Stenotrophomonas maltophilia NOT DETECTED NOT DETECTED Final  Candida albicans NOT DETECTED NOT DETECTED Final   Candida auris NOT DETECTED NOT DETECTED Final   Candida glabrata NOT DETECTED NOT DETECTED Final   Candida krusei NOT DETECTED NOT DETECTED Final   Candida parapsilosis NOT DETECTED NOT DETECTED Final   Candida tropicalis NOT DETECTED NOT DETECTED Final   Cryptococcus neoformans/gattii NOT DETECTED NOT DETECTED Final    Comment: Performed at [facility], [address] [place], [place] [address]  Blood culture  (routine x 2)     Status: Abnormal   Collection Time: 04/17/23  2:23 PM   Specimen: BLOOD  Result Value Ref Range Status   Specimen Description   Final    BLOOD BLOOD RIGHT HAND Performed at [facility], [address] [place], [place] [address]    Special Requests   Final    BOTTLES DRAWN AEROBIC AND ANAEROBIC Blood Culture results may not be optimal due to an excessive volume of blood received in culture bottles Performed at [facility], [address] [place], [place] [address]    Culture  Setup Time   Final    IN BOTH AEROBIC AND ANAEROBIC BOTTLES GRAM POSITIVE COCCI STREPTOCOCCUS PNEUMONIAE CRITICAL RESULT CALLED TO, READ BACK BY AND VERIFIED WITH: NATHAN BELEU @ 0400 04/18/23 [facility]    Culture (A)  Final    STREPTOCOCCUS PNEUMONIAE SUSCEPTIBILITIES PERFORMED ON PREVIOUS CULTURE WITHIN THE LAST 5 DAYS. Performed at [facility], [address], [place], [place] [address]    Report Status 04/20/2023 FINAL  Final  MRSA Next Gen by PCR, Nasal     Status: None   Collection Time: 04/18/23  9:39 AM   Specimen: Nasal Mucosa; Nasal Swab  Result Value Ref Range Status   MRSA by PCR Next Gen NOT DETECTED NOT DETECTED Final    Comment: (NOTE) The GeneXpert MRSA Assay (FDA approved for NASAL specimens only), is one component of a comprehensive MRSA colonization surveillance program. It is not intended to diagnose MRSA infection nor to guide or monitor treatment for MRSA infections. Test performance is not FDA approved in patients less than 67 years old. Performed at [facility], [address] [place], [place] [address]       Radiology Studies last 3 days: US THORACENTESIS ASP PLEURAL SPACE W/IMG GUIDE  Result Date: 04/20/2023 INDICATION: 47 year old female. History of congestive heart failure. End-stage renal disease on hemodialysis. Presents to the ED with shortness of breath found to have a left-sided pleural effusion request is for  therapeutic and diagnostic left-sided thoracentesis EXAM: ULTRASOUND GUIDED LEFT THORACENTESIS MEDICATIONS: Lidocaine 1% 10 mL COMPLICATIONS: None immediate. PROCEDURE: An ultrasound guided thoracentesis was thoroughly discussed with the patient and questions answered. The benefits, risks, alternatives and complications were also discussed. The patient understands and wishes to proceed with the procedure. Written consent was obtained. Ultrasound was performed to localize and mark an adequate pocket of fluid in the left chest. The area was then prepped and draped in the normal sterile fashion. 1% Lidocaine was used for local anesthesia. Under ultrasound guidance a 6 Fr Safe-T-Centesis catheter was introduced. Thoracentesis was performed. Per patient request the procedure was aborted after 600 mL of fluid was removed. The catheter was removed and a dressing applied. FINDINGS: A total of approximately 600 mL of amber color fluid was removed. Samples were sent to the laboratory as requested by the clinical team. IMPRESSION: Successful ultrasound guided therapeutic and diagnostic left-sided thoracentesis yielding 600 mL of pleural fluid. Performed by Anders Grant  NP Electronically Signed   By: Olive Bass M.D.   On: 04/20/2023 13:26   ECHOCARDIOGRAM COMPLETE  Result Date: 04/19/2023    ECHOCARDIOGRAM REPORT   Patient Name:   Rebecca Bridges Date of Exam: 04/19/2023 Medical Rec #:  [id number]  Height:       63.0 in Accession #:    [id number] Weight:       91.7 lb Date of Birth:  November 19, 1975   BSA:          1.388 m Patient Age:    47 years   BP:           83/60 mmHg Patient Gender: F          HR:           88 bpm. Exam Location:  [facility] Procedure: 2D Echo, 3D Echo, Cardiac Doppler, Color Doppler and Strain Analysis Indications:     CHF, Dilated Cardiomyopathy  History:         Patient has prior history of Echocardiogram examinations, most                  recent 01/27/2019. CHF and Cardiomyopathy, COPD,                   Arrythmias:LBBB; Risk Factors:Hypertension and Current Smoker.                  CKD, ESRD on Dialysis.  Sonographer:     Mikki Harbor Referring Phys:  [id number] Sunnie Nielsen Diagnosing Phys: Lorine Bears MD  Sonographer Comments: Global longitudinal strain was attempted. IMPRESSIONS  1. Left ventricular ejection fraction, by estimation, is 20 to 25%. The left ventricle has severely decreased function. The left ventricle demonstrates global hypokinesis. The left ventricular internal cavity size was severely dilated. Left ventricular diastolic parameters are indeterminate. The average left ventricular global longitudinal strain is -6.6 %. The global longitudinal strain is abnormal.  2. Right ventricular systolic function is moderately reduced. The right ventricular size is mildly enlarged. There is mildly elevated pulmonary artery systolic pressure.  3. Left atrial size was moderately dilated.  4. Right atrial size was moderately dilated.  5. The mitral valve is normal in structure. Moderate mitral valve regurgitation. No evidence of mitral stenosis.  6. Tricuspid valve regurgitation is moderate to severe.  7. The aortic valve is normal in structure. Aortic valve regurgitation is not visualized. No aortic stenosis is present.  8. Moderately dilated pulmonary artery.  9. The inferior vena cava is dilated in size with <50% respiratory variability, suggesting right atrial pressure of 15 mmHg. FINDINGS  Left Ventricle: Left ventricular ejection fraction, by estimation, is 20 to 25%. The left ventricle has severely decreased function. The left ventricle demonstrates global hypokinesis. The average left ventricular global longitudinal strain is -6.6 %. The global longitudinal strain is abnormal. The left ventricular internal cavity size was severely dilated. There is no left ventricular hypertrophy. Abnormal (paradoxical) septal motion, consistent with left bundle branch block. Left ventricular diastolic parameters  are indeterminate. Right Ventricle: The right ventricular size is mildly enlarged. No increase in right ventricular wall thickness. Right ventricular systolic function is moderately reduced. There is mildly elevated pulmonary artery systolic pressure. The tricuspid regurgitant velocity is 2.66 m/s, and with an assumed right atrial pressure of 15 mmHg, the estimated right ventricular systolic pressure is 43.3 mmHg. Left Atrium: Left atrial size was moderately dilated. Right Atrium: Right atrial size was moderately dilated. Pericardium: There is no evidence of pericardial effusion.  Mitral Valve: The mitral valve is normal in structure. Moderate mitral valve regurgitation. No evidence of mitral valve stenosis. MV peak gradient, 6.8 mmHg. The mean mitral valve gradient is 3.0 mmHg. Tricuspid Valve: The tricuspid valve is normal in structure. Tricuspid valve regurgitation is moderate to severe. No evidence of tricuspid stenosis. Aortic Valve: The aortic valve is normal in structure. Aortic valve regurgitation is not visualized. No aortic stenosis is present. Aortic valve mean gradient measures 4.0 mmHg. Aortic valve peak gradient measures 8.6 mmHg. Aortic valve area, by VTI measures 2.07 cm. Pulmonic Valve: The pulmonic valve was normal in structure. Pulmonic valve regurgitation is mild. No evidence of pulmonic stenosis. Aorta: The aortic root is normal in size and structure. Pulmonary Artery: The pulmonary artery is moderately dilated. Venous: The inferior vena cava is dilated in size with less than 50% respiratory variability, suggesting right atrial pressure of 15 mmHg. IAS/Shunts: No atrial level shunt detected by color flow Doppler. Additional Comments: There is a small pleural effusion in the left lateral region.  LEFT VENTRICLE PLAX 2D LVIDd:         6.70 cm      Diastology LVIDs:         5.80 cm      LV e' medial:    8.70 cm/s LV PW:         1.30 cm      LV E/e' medial:  11.4 LV IVS:        1.10 cm      LV e'  lateral:   9.14 cm/s LVOT diam:     1.90 cm      LV E/e' lateral: 10.9 LV SV:         46 LV SV Index:   33           2D Longitudinal Strain LVOT Area:     2.84 cm     2D Strain GLS Avg:     -6.6 %  LV Volumes (MOD) LV vol d, MOD A2C: 207.0 ml LV vol d, MOD A4C: 147.0 ml LV vol s, MOD A2C: 154.0 ml LV vol s, MOD A4C: 111.0 ml LV SV MOD A2C:     53.0 ml LV SV MOD A4C:     147.0 ml LV SV MOD BP:      43.4 ml RIGHT VENTRICLE RV Basal diam:  4.15 cm RV Mid diam:    3.60 cm RV S prime:     10.40 cm/s TAPSE (M-mode): 1.7 cm LEFT ATRIUM             Index        RIGHT ATRIUM           Index LA diam:        4.70 cm 3.39 cm/m   RA Area:     25.40 cm LA Vol (A2C):   77.9 ml 56.11 ml/m  RA Volume:   89.40 ml  64.40 ml/m LA Vol (A4C):   61.3 ml 44.16 ml/m LA Biplane Vol: 73.4 ml 52.87 ml/m  AORTIC VALVE                    PULMONIC VALVE AV Area (Vmax):    2.28 cm     PV Vmax:       1.07 m/s AV Area (Vmean):   1.99 cm     PV Peak grad:  4.6 mmHg AV Area (VTI):     2.07 cm AV Vmax:  147.00 cm/s AV Vmean:          92.000 cm/s AV VTI:            0.223 m AV Peak Grad:      8.6 mmHg AV Mean Grad:      4.0 mmHg LVOT Vmax:         118.00 cm/s LVOT Vmean:        64.500 cm/s LVOT VTI:          0.163 m LVOT/AV VTI ratio: 0.73  AORTA Ao Root diam: 3.20 cm Ao Asc diam:  2.80 cm MITRAL VALVE                  TRICUSPID VALVE MV Area (PHT): 6.96 cm       TR Peak grad:   28.3 mmHg MV Area VTI:   2.19 cm       TR Vmax:        266.00 cm/s MV Peak grad:  6.8 mmHg MV Mean grad:  3.0 mmHg       SHUNTS MV Vmax:       1.30 m/s       Systemic VTI:  0.16 m MV Vmean:      77.1 cm/s      Systemic Diam: 1.90 cm MV Decel Time: 109 msec MR Peak grad:    65.4 mmHg MR Mean grad:    38.5 mmHg MR Vmax:         404.50 cm/s MR Vmean:        287.0 cm/s MR PISA:         1.57 cm MR PISA Eff ROA: 36 mm MR PISA Radius:  0.50 cm MV E velocity: 99.60 cm/s MV A velocity: 89.60 cm/s MV E/A ratio:  1.11 Lorine Bears MD Electronically signed by  Lorine Bears MD Signature Date/Time: 04/19/2023/5:25:46 PM    Final    CT CHEST WO CONTRAST  Result Date: 04/17/2023 CLINICAL DATA:  Pneumonia, complication suspected, xray done EXAM: CT CHEST WITHOUT CONTRAST TECHNIQUE: Multidetector CT imaging of the chest was performed following the standard protocol without IV contrast. RADIATION DOSE REDUCTION: This exam was performed according to the departmental dose-optimization program which includes automated exposure control, adjustment of the mA and/or kV according to patient size and/or use of iterative reconstruction technique. COMPARISON:  01/17/2022 FINDINGS: Cardiovascular: Extensive multi-vessel coronary artery calcification. Moderate cardiomegaly is again identified with biventricular enlargement. No pericardial effusion. The central pulmonary arteries are enlarged in keeping with changes of pulmonary arterial hypertension. Mild atherosclerotic calcification within the thoracic aorta. No aortic aneurysm. Right internal jugular hemodialysis catheter tip noted within the right atrium. Mediastinum/Nodes: Visualized thyroid is unremarkable. Shotty mediastinal adenopathy, predominantly within the left prevascular and right paratracheal region is nonspecific, possibly reactive in nature. This appears progressive since prior examination. The esophagus is unremarkable. Lungs/Pleura: Moderate left pleural effusion is present with compressive atelectasis of the left lung base, slightly enlarged since prior examination. There is, however, superimposed dense consolidation within the left lower lobe and basilar lingula in keeping with changes of acute lobar pneumonia in the appropriate clinical setting. There is chronic consolidation within the lingula medially, stable since prior examination, likely post inflammatory in nature. Stable 4 mm subpleural pulmonary nodule within the right upper lobe, axial image # 70/4, safely considered benign. No pneumothorax or pleural  effusion on the right. There is extensive airway impaction within the left lower lobe and basilar lingula. No central obstructing mass. Upper Abdomen: At least  moderate ascites noted within the visualized upper abdomen. Stable splenomegaly, incompletely evaluated on this examination. Musculoskeletal: No acute bone abnormality. No lytic or blastic bone lesion. IMPRESSION: 1. Dense consolidation within the left lower lobe and basilar lingula in keeping with changes of acute lobar pneumonia in the appropriate clinical setting. Extensive airway impaction within the left lower lobe and basilar lingula. No central obstructing mass. 2. Moderate left pleural effusion with compressive atelectasis of the left lung base, slightly enlarged since prior examination. 3. Extensive multi-vessel coronary artery calcification. Moderate cardiomegaly with biventricular enlargement. 4. Morphologic changes in keeping with pulmonary arterial hypertension. 5. At least moderate ascites within the visualized upper abdomen. Stable splenomegaly. Together, these findings may reflect the sequela of portal venous hypertension, though this is incompletely evaluated on this exam. Aortic Atherosclerosis (ICD10-I70.0). Electronically Signed   By: Helyn Numbers M.D.   On: 04/17/2023 21:26   DG Chest Port 1 View  Result Date: 04/17/2023 CLINICAL DATA:  Acute chest pain EXAM: PORTABLE CHEST 1 VIEW COMPARISON:  12/24/2022 FINDINGS: Heart is enlarged with central vascular congestion. Dense left mid and lower lung collapse/consolidation concerning for pneumonia. Suspect associated left effusion. Right lung remains clear. No pneumothorax. Trachea midline. Aorta atherosclerotic. Right IJ dialysis catheter tip SVC RA junction as before. IMPRESSION: 1. Cardiomegaly with central vascular congestion. 2. Dense left mid and lower lung collapse/consolidation concerning for pneumonia. Electronically Signed   By: Judie Petit.  Shick M.D.   On: 04/17/2023 14:38        Time spent: 50 min    Sunnie Nielsen, DO [facility] 04/20/2023, 2:55 PM    Dictation software may have been used to generate the above note. Typos may occur and escape review in typed/dictated notes. Please contact Dr Lyn Hollingshead directly for clarity if needed.  Staff may message me via secure chat in Epic  but this may not receive an immediate response,  please page me for urgent matters!  If 7PM-7AM, please contact night coverage [email or web address]

## 2023-04-20 NOTE — Progress Notes (Signed)
Heart Failure Navigator Progress Note  Assessed for Heart & Vascular TOC clinic readiness.  Patient has an Advanced Heart Failure Team Consult and is ESRD on hemodialysis.  Navigator will sign off at this time.  Roxy Horseman, RN, BSN [facility] Heart Failure Navigator Secure Chat Only

## 2023-04-20 NOTE — Progress Notes (Signed)
[facility]  PROGRESS NOTE   Subjective:   Patient sitting up in bed Alert No complaints to offer  Dialysis scheduled for later today  Objective:  Vital signs: Blood pressure 122/79, pulse 92, temperature 98 F (36.7 C), temperature source Oral, resp. rate 15, height 5\' 3"  (1.6 m), weight 41.6 kg, last menstrual period 05/09/2019, SpO2 100%.  Intake/Output Summary (Last 24 hours) at 04/20/2023 1517 Last data filed at 04/19/2023 1700 Gross per 24 hour  Intake 120 ml  Output --  Net 120 ml   Filed Weights   04/17/23 1656 04/17/23 1946 04/17/23 2128  Weight: 55 kg 53 kg 41.6 kg     Physical Exam: General:  No acute distress  Head:  Normocephalic, atraumatic. Moist oral mucosal membranes  Eyes:  Anicteric  Lungs:   Clear to auscultation, normal effort  Heart:  S1S2 no rubs  Abdomen:   Soft, nontender, bowel sounds present  Extremities:  No peripheral edema.  Neurologic:  Awake, alert, following commands  Skin:  No lesions  Access: Rt permcath    Basic Metabolic Panel: Recent Labs  Lab 04/17/23 1423 04/17/23 2207 04/18/23 0032 04/18/23 0215 04/19/23 0409 04/20/23 0443  NA 128* 129*  --  128* 125* 126*  K 6.3* 3.3*  --  4.3 3.9 4.1  CL 96* 94*  --  94* 90* 90*  CO2 21* 26  --  26 24 25   GLUCOSE 97 125* 52* 119* 100* 94  BUN 35* 18  --  21* 39* 52*  CREATININE 6.13* 3.65*  --  4.11* 5.43* 6.48*  CALCIUM 8.2* 7.6*  --  7.7* 7.9* 7.7*   GFR: Estimated Creatinine Clearance: 7.1 mL/min (A) (by C-G formula based on SCr of 6.48 mg/dL (H)).  Liver Function Tests: Recent Labs  Lab 04/17/23 1423 04/17/23 2207  AST 33 37  ALT 19 16  ALKPHOS 79 68  BILITOT 1.7* 1.7*  PROT 8.9* 8.1  ALBUMIN 2.8* 2.5*   No results for input(s): "LIPASE", "AMYLASE" in the last 168 hours. No results for input(s): "AMMONIA" in the last 168 hours.  CBC: Recent Labs  Lab 04/17/23 1423 04/18/23 0215 04/18/23 1516 04/19/23 0409 04/20/23 0443  WBC 12.2* 10.1 6.6  5.1 5.0  NEUTROABS  --   --  5.8  --   --   HGB 10.0* 9.7* 9.1* 8.6* 8.6*  HCT 32.3* 30.2* 27.9* 26.8* 26.5*  MCV 108.4* 104.9* 100* 103.9* 103.1*  PLT 127* 74* 76* 68* 76*     HbA1C: Hgb A1c MFr Bld  Date/Time Value Ref Range Status  01/26/2019 04:49 PM 5.5 4.8 - 5.6 % Final    Comment:    (NOTE) Pre diabetes:          5.7%-6.4% Diabetes:              >6.4% Glycemic control for   <7.0% adults with diabetes     Urinalysis: No results for input(s): "COLORURINE", "LABSPEC", "PHURINE", "GLUCOSEU", "HGBUR", "BILIRUBINUR", "KETONESUR", "PROTEINUR", "UROBILINOGEN", "NITRITE", "LEUKOCYTESUR" in the last 72 hours.  Invalid input(s): "APPERANCEUR"    Imaging: US THORACENTESIS ASP PLEURAL SPACE W/IMG GUIDE  Result Date: 04/20/2023 INDICATION: 47 year old female. History of congestive heart failure. End-stage renal disease on hemodialysis. Presents to the ED with shortness of breath found to have a left-sided pleural effusion request is for therapeutic and diagnostic left-sided thoracentesis EXAM: ULTRASOUND GUIDED LEFT THORACENTESIS MEDICATIONS: Lidocaine 1% 10 mL COMPLICATIONS: None immediate. PROCEDURE: An ultrasound guided thoracentesis was thoroughly discussed with the patient  and questions answered. The benefits, risks, alternatives and complications were also discussed. The patient understands and wishes to proceed with the procedure. Written consent was obtained. Ultrasound was performed to localize and mark an adequate pocket of fluid in the left chest. The area was then prepped and draped in the normal sterile fashion. 1% Lidocaine was used for local anesthesia. Under ultrasound guidance a 6 Fr Safe-T-Centesis catheter was introduced. Thoracentesis was performed. Per patient request the procedure was aborted after 600 mL of fluid was removed. The catheter was removed and a dressing applied. FINDINGS: A total of approximately 600 mL of amber color fluid was removed. Samples were sent to  the laboratory as requested by the clinical team. IMPRESSION: Successful ultrasound guided therapeutic and diagnostic left-sided thoracentesis yielding 600 mL of pleural fluid. Performed by Anders Grant NP Electronically Signed   By: Olive Bass M.D.   On: 04/20/2023 13:26   ECHOCARDIOGRAM COMPLETE  Result Date: 04/19/2023    ECHOCARDIOGRAM REPORT   Patient Name:   Rebecca Bridges Date of Exam: 04/19/2023 Medical Rec #:  [id number]  Height:       63.0 in Accession #:    [id number] Weight:       91.7 lb Date of Birth:  1976/05/05   BSA:          1.388 m Patient Age:    47 years   BP:           83/60 mmHg Patient Gender: F          HR:           88 bpm. Exam Location:  [facility] Procedure: 2D Echo, 3D Echo, Cardiac Doppler, Color Doppler and Strain Analysis Indications:     CHF, Dilated Cardiomyopathy  History:         Patient has prior history of Echocardiogram examinations, most                  recent 01/27/2019. CHF and Cardiomyopathy, COPD,                  Arrythmias:LBBB; Risk Factors:Hypertension and Current Smoker.                  CKD, ESRD on Dialysis.  Sonographer:     Mikki Harbor Referring Phys:  [id number] Sunnie Nielsen Diagnosing Phys: Lorine Bears MD  Sonographer Comments: Global longitudinal strain was attempted. IMPRESSIONS  1. Left ventricular ejection fraction, by estimation, is 20 to 25%. The left ventricle has severely decreased function. The left ventricle demonstrates global hypokinesis. The left ventricular internal cavity size was severely dilated. Left ventricular diastolic parameters are indeterminate. The average left ventricular global longitudinal strain is -6.6 %. The global longitudinal strain is abnormal.  2. Right ventricular systolic function is moderately reduced. The right ventricular size is mildly enlarged. There is mildly elevated pulmonary artery systolic pressure.  3. Left atrial size was moderately dilated.  4. Right atrial size was moderately dilated.  5. The mitral  valve is normal in structure. Moderate mitral valve regurgitation. No evidence of mitral stenosis.  6. Tricuspid valve regurgitation is moderate to severe.  7. The aortic valve is normal in structure. Aortic valve regurgitation is not visualized. No aortic stenosis is present.  8. Moderately dilated pulmonary artery.  9. The inferior vena cava is dilated in size with <50% respiratory variability, suggesting right atrial pressure of 15 mmHg. FINDINGS  Left Ventricle: Left ventricular ejection fraction, by estimation, is 20 to 25%. The  left ventricle has severely decreased function. The left ventricle demonstrates global hypokinesis. The average left ventricular global longitudinal strain is -6.6 %. The global longitudinal strain is abnormal. The left ventricular internal cavity size was severely dilated. There is no left ventricular hypertrophy. Abnormal (paradoxical) septal motion, consistent with left bundle branch block. Left ventricular diastolic parameters are indeterminate. Right Ventricle: The right ventricular size is mildly enlarged. No increase in right ventricular wall thickness. Right ventricular systolic function is moderately reduced. There is mildly elevated pulmonary artery systolic pressure. The tricuspid regurgitant velocity is 2.66 m/s, and with an assumed right atrial pressure of 15 mmHg, the estimated right ventricular systolic pressure is 43.3 mmHg. Left Atrium: Left atrial size was moderately dilated. Right Atrium: Right atrial size was moderately dilated. Pericardium: There is no evidence of pericardial effusion. Mitral Valve: The mitral valve is normal in structure. Moderate mitral valve regurgitation. No evidence of mitral valve stenosis. MV peak gradient, 6.8 mmHg. The mean mitral valve gradient is 3.0 mmHg. Tricuspid Valve: The tricuspid valve is normal in structure. Tricuspid valve regurgitation is moderate to severe. No evidence of tricuspid stenosis. Aortic Valve: The aortic valve is  normal in structure. Aortic valve regurgitation is not visualized. No aortic stenosis is present. Aortic valve mean gradient measures 4.0 mmHg. Aortic valve peak gradient measures 8.6 mmHg. Aortic valve area, by VTI measures 2.07 cm. Pulmonic Valve: The pulmonic valve was normal in structure. Pulmonic valve regurgitation is mild. No evidence of pulmonic stenosis. Aorta: The aortic root is normal in size and structure. Pulmonary Artery: The pulmonary artery is moderately dilated. Venous: The inferior vena cava is dilated in size with less than 50% respiratory variability, suggesting right atrial pressure of 15 mmHg. IAS/Shunts: No atrial level shunt detected by color flow Doppler. Additional Comments: There is a small pleural effusion in the left lateral region.  LEFT VENTRICLE PLAX 2D LVIDd:         6.70 cm      Diastology LVIDs:         5.80 cm      LV e' medial:    8.70 cm/s LV PW:         1.30 cm      LV E/e' medial:  11.4 LV IVS:        1.10 cm      LV e' lateral:   9.14 cm/s LVOT diam:     1.90 cm      LV E/e' lateral: 10.9 LV SV:         46 LV SV Index:   33           2D Longitudinal Strain LVOT Area:     2.84 cm     2D Strain GLS Avg:     -6.6 %  LV Volumes (MOD) LV vol d, MOD A2C: 207.0 ml LV vol d, MOD A4C: 147.0 ml LV vol s, MOD A2C: 154.0 ml LV vol s, MOD A4C: 111.0 ml LV SV MOD A2C:     53.0 ml LV SV MOD A4C:     147.0 ml LV SV MOD BP:      43.4 ml RIGHT VENTRICLE RV Basal diam:  4.15 cm RV Mid diam:    3.60 cm RV S prime:     10.40 cm/s TAPSE (M-mode): 1.7 cm LEFT ATRIUM             Index        RIGHT ATRIUM  Index LA diam:        4.70 cm 3.39 cm/m   RA Area:     25.40 cm LA Vol (A2C):   77.9 ml 56.11 ml/m  RA Volume:   89.40 ml  64.40 ml/m LA Vol (A4C):   61.3 ml 44.16 ml/m LA Biplane Vol: 73.4 ml 52.87 ml/m  AORTIC VALVE                    PULMONIC VALVE AV Area (Vmax):    2.28 cm     PV Vmax:       1.07 m/s AV Area (Vmean):   1.99 cm     PV Peak grad:  4.6 mmHg AV Area (VTI):      2.07 cm AV Vmax:           147.00 cm/s AV Vmean:          92.000 cm/s AV VTI:            0.223 m AV Peak Grad:      8.6 mmHg AV Mean Grad:      4.0 mmHg LVOT Vmax:         118.00 cm/s LVOT Vmean:        64.500 cm/s LVOT VTI:          0.163 m LVOT/AV VTI ratio: 0.73  AORTA Ao Root diam: 3.20 cm Ao Asc diam:  2.80 cm MITRAL VALVE                  TRICUSPID VALVE MV Area (PHT): 6.96 cm       TR Peak grad:   28.3 mmHg MV Area VTI:   2.19 cm       TR Vmax:        266.00 cm/s MV Peak grad:  6.8 mmHg MV Mean grad:  3.0 mmHg       SHUNTS MV Vmax:       1.30 m/s       Systemic VTI:  0.16 m MV Vmean:      77.1 cm/s      Systemic Diam: 1.90 cm MV Decel Time: 109 msec MR Peak grad:    65.4 mmHg MR Mean grad:    38.5 mmHg MR Vmax:         404.50 cm/s MR Vmean:        287.0 cm/s MR PISA:         1.57 cm MR PISA Eff ROA: 36 mm MR PISA Radius:  0.50 cm MV E velocity: 99.60 cm/s MV A velocity: 89.60 cm/s MV E/A ratio:  1.11 Lorine Bears MD Electronically signed by Lorine Bears MD Signature Date/Time: 04/19/2023/5:25:46 PM    Final      Medications:    cefTRIAXone (ROCEPHIN)  IV 2 g (04/20/23 1449)    apixaban  5 mg Oral BID   bictegravir-emtricitabine-tenofovir AF  1 tablet Oral Daily   calcium acetate  2,001 mg Oral TID AC   Chlorhexidine Gluconate Cloth  6 each Topical Q0600   dapsone  50 mg Oral BID   gabapentin  300 mg Oral QHS   metoprolol succinate  12.5 mg Oral Daily   [START ON 04/25/2023] pantoprazole  40 mg Oral Daily   torsemide  40 mg Oral Daily    Assessment/ Plan:     47 year old female with history of hypertension, coronary artery disease, congestive heart failure, HIV, cirrhosis of the liver, end-stage renal disease on dialysis now admitted  with history of shortness of breath, cough, fever. She is found to have fluid overload and also possible pneumonia. She is on a Tuesday Thursday Saturday schedule for dialysis.   [facility]/TTS/right chest PermCath   #1: ESRD on  hemodialysis: Patient scheduled for dialysis later today. Patient states she has family coming to visit and would like to perform dialysis later today. Discussed there is not a late shift today and dialysis may be moved until tomorrow. Awaiting patient response.  #2: Sepsis/community-acquired pneumonia: Patient receiving Rocephin.  #3: Hypotension: Blood pressure stable, 122/79. Receiving metoprolol and torsemide.   #4: Secondary hyperparathyroidism: Will monitor bone minerals during this admission. Continue calcium acetate.  #5: Anemia with chronic kidney disease: Hgb 8.6. Patient receives Mircera at outpatient clinic. May require EPO during this admission. Will recheck labs in am    LOS: 3 [facility] 11/5/20243:17 PM

## 2023-04-20 NOTE — Procedures (Signed)
Ultrasound-guided diagnostic and therapeutic left sided thoracentesis performed yielding 600 mililiters of amber colored fluid. No immediate complications.   Diagnostic fluid was sent to the lab for further analysis. Follow-up chest x-ray pending. EBL is < 2 ml.  Per Patient request additional fluid was not removed at this time.

## 2023-04-20 NOTE — Care Management Important Message (Signed)
Important Message  Patient Details  Name: Rebecca Bridges MRN: [id number] Date of Birth: 02/20/1976   Important Message Given:  N/A - LOS <3 / Initial given by admissions     Olegario Messier A [name] 04/20/2023, 12:24 PM

## 2023-04-20 NOTE — TOC Progression Note (Signed)
Transition of Care [facility]) - Progression Note    Patient Details  Name: Rebecca Bridges MRN: [id number] Date of Birth: [birth year]  Transition of Care [facility]) CM/SW Contact  Truddie Hidden, RN Phone Number: 04/20/2023, 10:19 AM  Clinical Narrative:    TOC continuing to follow patient's progress throughout discharge planning.   Expected Discharge Plan: Home/Self Care Barriers to Discharge: Continued Medical Work up  Expected Discharge Plan and Services     Post Acute Care Choice: NA Living arrangements for the past 2 months: Single Family Home                                       Social Determinants of Health (SDOH) Interventions SDOH Screenings   Food Insecurity: No Food Insecurity (04/17/2023)  Housing: Low Risk  (04/17/2023)  Transportation Needs: No Transportation Needs (04/17/2023)  Utilities: Not At Risk (04/17/2023)  Financial Resource Strain: Low Risk  (12/29/2022)   Received from [facility]  Physical Activity: Inactive (04/26/2019)  Social Connections: Moderately Isolated (04/26/2019)  Stress: No Stress Concern Present (04/26/2019)  Tobacco Use: High Risk (04/17/2023)    Readmission Risk Interventions    04/19/2023   12:36 PM  Readmission Risk Prevention Plan  Transportation Screening Complete  PCP or Specialist Appt within 3-5 Days Complete  Social Work Consult for Recovery Care Planning/Counseling Complete  Palliative Care Screening Not Applicable  Medication Review Oceanographer) Complete

## 2023-04-21 DIAGNOSIS — J90 Pleural effusion, not elsewhere classified: Secondary | ICD-10-CM

## 2023-04-21 DIAGNOSIS — I5023 Acute on chronic systolic (congestive) heart failure: Secondary | ICD-10-CM | POA: Diagnosis not present

## 2023-04-21 DIAGNOSIS — R7881 Bacteremia: Secondary | ICD-10-CM

## 2023-04-21 DIAGNOSIS — N186 End stage renal disease: Secondary | ICD-10-CM

## 2023-04-21 DIAGNOSIS — J181 Lobar pneumonia, unspecified organism: Secondary | ICD-10-CM | POA: Diagnosis not present

## 2023-04-21 DIAGNOSIS — E875 Hyperkalemia: Secondary | ICD-10-CM

## 2023-04-21 DIAGNOSIS — E871 Hypo-osmolality and hyponatremia: Secondary | ICD-10-CM

## 2023-04-21 DIAGNOSIS — Z21 Asymptomatic human immunodeficiency virus [HIV] infection status: Secondary | ICD-10-CM

## 2023-04-21 DIAGNOSIS — B20 Human immunodeficiency virus [HIV] disease: Secondary | ICD-10-CM | POA: Diagnosis not present

## 2023-04-21 DIAGNOSIS — R040 Epistaxis: Secondary | ICD-10-CM | POA: Insufficient documentation

## 2023-04-21 DIAGNOSIS — A403 Sepsis due to Streptococcus pneumoniae: Secondary | ICD-10-CM | POA: Diagnosis not present

## 2023-04-21 DIAGNOSIS — Z992 Dependence on renal dialysis: Secondary | ICD-10-CM

## 2023-04-21 DIAGNOSIS — D638 Anemia in other chronic diseases classified elsewhere: Secondary | ICD-10-CM | POA: Insufficient documentation

## 2023-04-21 DIAGNOSIS — L8990 Pressure ulcer of unspecified site, unspecified stage: Secondary | ICD-10-CM

## 2023-04-21 DIAGNOSIS — L89151 Pressure ulcer of sacral region, stage 1: Secondary | ICD-10-CM

## 2023-04-21 LAB — BASIC METABOLIC PANEL
Anion gap: 10 (ref 5–15)
BUN: 80 mg/dL — ABNORMAL HIGH (ref 6–20)
CO2: 27 mmol/L (ref 22–32)
Calcium: 8 mg/dL — ABNORMAL LOW (ref 8.9–10.3)
Chloride: 92 mmol/L — ABNORMAL LOW (ref 98–111)
Creatinine, Ser: 7.72 mg/dL — ABNORMAL HIGH (ref 0.44–1.00)
GFR, Estimated: 6 mL/min — ABNORMAL LOW (ref >60)
Glucose, Bld: 88 mg/dL (ref 70–99)
Potassium: 4.1 mmol/L (ref 3.5–5.1)
Sodium: 129 mmol/L — ABNORMAL LOW (ref 135–145)

## 2023-04-21 LAB — TRIGLYCERIDES, BODY FLUIDS: Triglycerides, Fluid: 18 mg/dL

## 2023-04-21 LAB — GLUCOSE, CAPILLARY
Glucose-Capillary: 71 mg/dL (ref 70–99)
Glucose-Capillary: 82 mg/dL (ref 70–99)

## 2023-04-21 LAB — CYTOLOGY - NON PAP

## 2023-04-21 MED ORDER — ALUM & MAG HYDROXIDE-SIMETH 200-200-20 MG/5ML PO SUSP
30.0000 mL | Freq: Once | ORAL | Status: DC
  Filled 2023-04-21: qty 30

## 2023-04-21 MED ORDER — HEPARIN SODIUM (PORCINE) 1000 UNIT/ML IJ SOLN
INTRAMUSCULAR | Status: AC
Start: 2023-04-21 — End: ?
  Filled 2023-04-21: qty 10

## 2023-04-21 MED ORDER — SALINE SPRAY 0.65 % NA SOLN
1.0000 | NASAL | Status: DC | PRN
  Filled 2023-04-21: qty 44

## 2023-04-21 MED ORDER — OXYMETAZOLINE HCL 0.05 % NA SOLN
4.0000 | NASAL | Status: AC | PRN
Start: 2023-04-21 — End: 2023-04-24
  Filled 2023-04-21: qty 15

## 2023-04-21 MED ORDER — SODIUM CHLORIDE 0.9 % IV SOLN
2.0000 g | INTRAVENOUS | Status: DC
  Administered 2023-04-21 – 2023-04-26 (×6): 2 g via INTRAVENOUS
  Filled 2023-04-21 (×6): qty 20

## 2023-04-21 NOTE — Assessment & Plan Note (Signed)
Present on admission, sacrum stage I.  See full description below.

## 2023-04-21 NOTE — Assessment & Plan Note (Signed)
On presentation.  Improved with dialysis.

## 2023-04-21 NOTE — Progress Notes (Addendum)
[facility]  PROGRESS NOTE   Subjective:   Patient seen and evaluated in dialysis suite Patient complaint of chest pain, 8 out of 10 States she did not sleep well overnight Reports symptomatic hypoglycemia this morning States pain originates below left breast and radiates around to her back. Did receive pain medication prior to dialysis and symptoms are improving. Agreeable to proceed with dialysis treatment  Requesting if vascular can evaluate lt avf due to pain and bruising.   Objective:  Vital signs: Blood pressure 101/67, pulse 94, temperature 97.9 F (36.6 C), temperature source Oral, resp. rate (!) 24, height 5\' 3"  (1.6 m), weight 41.6 kg, last menstrual period 05/09/2019, SpO2 95%.  Intake/Output Summary (Last 24 hours) at 04/21/2023 1102 Last data filed at 04/20/2023 1500 Gross per 24 hour  Intake --  Output 200 ml  Net -200 ml   Filed Weights   04/17/23 1656 04/17/23 1946 04/17/23 2128  Weight: 55 kg 53 kg 41.6 kg     Physical Exam: General:  No acute distress  Head:  Normocephalic, atraumatic. Moist oral mucosal membranes  Eyes:  Anicteric  Lungs:   Clear to auscultation, normal effort  Heart:  S1S2 no rubs  Abdomen:   Soft, nontender, bowel sounds present  Extremities:  No peripheral edema.  Neurologic:  Awake, alert, following commands  Skin:  No lesions  Access: Rt permcath, Lt AVF (maturing)    Basic Metabolic Panel: Recent Labs  Lab 04/17/23 2207 04/18/23 0032 04/18/23 0215 04/19/23 0409 04/20/23 0443 04/21/23 0459  NA 129*  --  128* 125* 126* 129*  K 3.3*  --  4.3 3.9 4.1 4.1  CL 94*  --  94* 90* 90* 92*  CO2 26  --  26 24 25 27   GLUCOSE 125* 52* 119* 100* 94 88  BUN 18  --  21* 39* 52* 80*  CREATININE 3.65*  --  4.11* 5.43* 6.48* 7.72*  CALCIUM 7.6*  --  7.7* 7.9* 7.7* 8.0*   GFR: Estimated Creatinine Clearance: 6 mL/min (A) (by C-G formula based on SCr of 7.72 mg/dL (H)).  Liver Function Tests: Recent Labs  Lab  04/17/23 1423 04/17/23 2207  AST 33 37  ALT 19 16  ALKPHOS 79 68  BILITOT 1.7* 1.7*  PROT 8.9* 8.1  ALBUMIN 2.8* 2.5*   No results for input(s): "LIPASE", "AMYLASE" in the last 168 hours. No results for input(s): "AMMONIA" in the last 168 hours.  CBC: Recent Labs  Lab 04/17/23 1423 04/18/23 0215 04/18/23 1516 04/19/23 0409 04/20/23 0443  WBC 12.2* 10.1 6.6 5.1 5.0  NEUTROABS  --   --  5.8  --   --   HGB 10.0* 9.7* 9.1* 8.6* 8.6*  HCT 32.3* 30.2* 27.9* 26.8* 26.5*  MCV 108.4* 104.9* 100* 103.9* 103.1*  PLT 127* 74* 76* 68* 76*     HbA1C: Hgb A1c MFr Bld  Date/Time Value Ref Range Status  01/26/2019 04:49 PM 5.5 4.8 - 5.6 % Final    Comment:    (NOTE) Pre diabetes:          5.7%-6.4% Diabetes:              >6.4% Glycemic control for   <7.0% adults with diabetes     Urinalysis: No results for input(s): "COLORURINE", "LABSPEC", "PHURINE", "GLUCOSEU", "HGBUR", "BILIRUBINUR", "KETONESUR", "PROTEINUR", "UROBILINOGEN", "NITRITE", "LEUKOCYTESUR" in the last 72 hours.  Invalid input(s): "APPERANCEUR"    Imaging: DG Chest Port 1 View  Result Date: 04/20/2023 CLINICAL DATA:  Status post left thoracentesis. EXAM: PORTABLE CHEST 1 VIEW COMPARISON:  April 17, 2023. FINDINGS: Stable cardiomediastinal silhouette. No pneumothorax is noted status post left thoracentesis. Moderate left pleural effusion remains. IMPRESSION: No pneumothorax status post left thoracentesis. Electronically Signed   By: Lupita Raider M.D.   On: 04/20/2023 15:18   US THORACENTESIS ASP PLEURAL SPACE W/IMG GUIDE  Result Date: 04/20/2023 INDICATION: 47 year old female. History of congestive heart failure. End-stage renal disease on hemodialysis. Presents to the ED with shortness of breath found to have a left-sided pleural effusion request is for therapeutic and diagnostic left-sided thoracentesis EXAM: ULTRASOUND GUIDED LEFT THORACENTESIS MEDICATIONS: Lidocaine 1% 10 mL COMPLICATIONS: None immediate.  PROCEDURE: An ultrasound guided thoracentesis was thoroughly discussed with the patient and questions answered. The benefits, risks, alternatives and complications were also discussed. The patient understands and wishes to proceed with the procedure. Written consent was obtained. Ultrasound was performed to localize and mark an adequate pocket of fluid in the left chest. The area was then prepped and draped in the normal sterile fashion. 1% Lidocaine was used for local anesthesia. Under ultrasound guidance a 6 Fr Safe-T-Centesis catheter was introduced. Thoracentesis was performed. Per patient request the procedure was aborted after 600 mL of fluid was removed. The catheter was removed and a dressing applied. FINDINGS: A total of approximately 600 mL of amber color fluid was removed. Samples were sent to the laboratory as requested by the clinical team. IMPRESSION: Successful ultrasound guided therapeutic and diagnostic left-sided thoracentesis yielding 600 mL of pleural fluid. Performed by Anders Grant NP Electronically Signed   By: Olive Bass M.D.   On: 04/20/2023 13:26     Medications:    cefTRIAXone (ROCEPHIN)  IV 2 g (04/20/23 1449)    alum & mag hydroxide-simeth  30 mL Oral Once   apixaban  5 mg Oral BID   bictegravir-emtricitabine-tenofovir AF  1 tablet Oral Daily   calcium acetate  2,001 mg Oral TID AC   Chlorhexidine Gluconate Cloth  6 each Topical Q0600   dapsone  50 mg Oral BID   gabapentin  300 mg Oral QHS   metoprolol succinate  12.5 mg Oral Daily   [START ON 04/25/2023] pantoprazole  40 mg Oral Daily   torsemide  40 mg Oral Daily    Assessment/ Plan:     47 year old female with history of hypertension, coronary artery disease, congestive heart failure, HIV, cirrhosis of the liver, end-stage renal disease on dialysis now admitted with history of shortness of breath, cough, fever. She is found to have fluid overload and also possible pneumonia. She is on a Tuesday Thursday  Saturday schedule for dialysis.   [facility]/TTS/right chest PermCath   #1: ESRD on hemodialysis: Dialysis treatment deferred until today at patient request.  Patient will receive dialysis with UF 0.  Next treatment scheduled for Thursday.  Dialysis initiated after resolution of chest discomfort.  Will monitor during treatment. Vascular has been evaluated to assess lt AVF and, if approved, will attempt to use for treatment.   #2: Sepsis/community-acquired pneumonia: Continue Rocephin per primary team.  #3: Hypotension: Blood pressure 90/65 during dialysis.  Receiving metoprolol and torsemide.   #4: Secondary hyperparathyroidism: Will monitor bone minerals during this admission. Continue calcium acetate.  #5: Anemia with chronic kidney disease:  Patient receives Mircera at outpatient clinic. May require EPO during this admission.     LOS: 4 [facility] 11/6/202411:02 AM

## 2023-04-21 NOTE — Assessment & Plan Note (Signed)
Patient had dialysis yesterday

## 2023-04-21 NOTE — Assessment & Plan Note (Deleted)
CD4 count 47.  Patient on Biktarvy.  Patient on dapsone.

## 2023-04-21 NOTE — Plan of Care (Signed)

## 2023-04-21 NOTE — Progress Notes (Signed)
Received patient in bed to unit.    Informed consent signed and in chart.    TX duration: 3.5 hrs     Transported back to floor  Hand-off given to patient's nurse.   Access used:  rt chest cvc Access issues: n/a Cathflo removed prior to tx from indwelling  Total UF removed: 0 mls      Maple Hudson, RN Dialysis Unit

## 2023-04-21 NOTE — Assessment & Plan Note (Addendum)
Dialysis to manage fluid, last EF 20 to 25% with moderate mitral regurgitation.  Patient on Toprol-XL and torsemide.

## 2023-04-21 NOTE — Assessment & Plan Note (Signed)
Last hemoglobin 9.5. 

## 2023-04-21 NOTE — Assessment & Plan Note (Addendum)
Parapneumonic effusion.  Thoracentesis removed 600 mL underneath the left lung on 11/5 and another 800 mL underneath the left lung on 11/7.

## 2023-04-21 NOTE — Assessment & Plan Note (Signed)
Advised not to blow her nose.  Watch closely while on Eliquis.  As needed Afrin nasal spray.

## 2023-04-21 NOTE — Discharge Planning (Signed)
ESTABLISHED HEMODIALYSIS  Outpatient Facility  [facility] [phone], [place] [address] [phone]  Schedule: TTS 6:00am  Patient is active and can resume schedule upon discharge.  Dimas Chyle Dialysis Coordinator II  [facility] Cell: [phone] eFax: [phone] [email or web address]

## 2023-04-21 NOTE — Progress Notes (Signed)
Progress Note   Patient: Rebecca Bridges BJY:[id number] DOB: April 22, 1976 DOA: 04/17/2023     4 DOS: the patient was seen and examined on 04/21/2023   Brief hospital course: HPI: Rebecca Bridges is a 47 y.o. female with medical history significant of ESRD on HD TTS, dilated cardiomyopathy with chronic combined HFrEF and HFpEF with LVEF <15% on echo 2023, nonobstructive CAD, COPD, cirrhosis requiring paracentesis, PE on Eliquis, HIV (CD4=80, Oct/2024) on antiviral treatment and dapsone, HCV presented with worsening of productive cough shortness of breath and left-sided chest pain. Onset 2d prior, progressed to productive cough w/ blood tinged mucus, L chest pain. EMS called. Febrile, tachycardic, SpO2 87% RA.   Hospital course / significant events:  11/02: to ED, CXR (+)L PNA and pleural effusion, admitted to hospitalist service for sepsis  11/03: (+)BCx strep pneumo. Echo ordered, likely will end up needing TEE, will continue abx and involve ID, chest pain PM and no EKG changes and troponin elevated but downtrending 11/04: ID consult pending, echo done and read is pending, BP stable, no further chest pain 11/05: echo - EF stable 20-25% severe hypokinesis/strain. Thoracentesis removed 600 mL 11/6.  Patient feeling better today but did have a bloody nose this morning and had low blood pressure  Consultants:  Infectious disease Advanced heart failure   Procedures/Surgeries: Thoracentesis on 11/5.            Assessment and Plan: * Sepsis due to Streptococcus pneumoniae (HCC) Severe sepsis, present on admission with fever, tachycardia, left-sided pneumonia with pleural effusion, lactic acidosis.  Blood cultures positive for Streptococcus pneumoniae.  Repeat blood cultures are negative.  Patient had thoracentesis of 600 mL drawn off underneath the right lung yesterday.  Pleural effusion, left Thoracentesis removed 600 mL underneath the left lung on 11/5.  No growth less than 24 hours.  Acute on  chronic systolic CHF (congestive heart failure) (HCC) Dialysis to manage fluid, last EF 20 to 25% with moderate mitral regurgitation.  Patient on Toprol-XL.  End-stage renal disease on hemodialysis [facility]) Patient had dialysis today.  HIV (human immunodeficiency virus infection) (HCC) CD4 count 47.  Patient on Biktarvy.  Patient on dapsone.  Hyperkalemia On presentation.  Improved with dialysis.  Pressure injury of skin Present on admission, sacrum stage I.  See full description below.  Epistaxis Advised not to blow her nose.  Watch closely while on Eliquis.  As needed Afrin nasal spray.  Anemia of chronic disease Last hemoglobin 8.6  Hyponatremia Dialysis to manage        Subjective: Patient states she blew on a big nose clot out of her nose this morning x 2.  She also had some low blood pressure and a relative low sugar.  Did not feel well this morning but this afternoon feels better than she has in a while.  Admitted with Streptococcus pneumoniae sepsis.  Physical Exam: Vitals:   04/21/23 1130 04/21/23 1200 04/21/23 1213 04/21/23 1223  BP: 99/65 97/86 107/74 108/72  Pulse: 88 86 87 85  Resp: 16 (!) 21 (!) 26 16  Temp:    98.5 F (36.9 C)  TempSrc:      SpO2:    100%  Weight:      Height:       Physical Exam HENT:     Head: Normocephalic.     Nose:     Comments: No bleeding seen.    Mouth/Throat:     Pharynx: No oropharyngeal exudate.  Eyes:     General: Lids  are normal.     Conjunctiva/sclera: Conjunctivae normal.  Cardiovascular:     Rate and Rhythm: Normal rate and regular rhythm.     Heart sounds: S1 normal and S2 normal. Murmur heard.     Systolic murmur is present with a grade of 3/6.  Pulmonary:     Breath sounds: Examination of the left-lower field reveals decreased breath sounds. Decreased breath sounds present. No wheezing, rhonchi or rales.  Abdominal:     Palpations: Abdomen is soft.     Tenderness: There is no abdominal tenderness.   Musculoskeletal:     Right lower leg: No swelling.     Left lower leg: No swelling.  Skin:    General: Skin is warm.     Findings: No rash.  Neurological:     Mental Status: She is alert and oriented to person, place, and time.     Data Reviewed: Last hemoglobin 8.6, creatinine 7.72, sodium 129   Disposition: Status is: Inpatient Remains inpatient appropriate because: Continue IV Rocephin  Planned Discharge Destination: Home    Time spent: 28 minutes  Author: Alford Highland, MD 04/21/2023 2:03 PM  For on call review [email or web address].

## 2023-04-21 NOTE — Assessment & Plan Note (Signed)
Dialysis to manage

## 2023-04-21 NOTE — Assessment & Plan Note (Addendum)
Severe sepsis, present on admission with fever, tachycardia, left-sided pneumonia with pleural effusion, lactic acidosis.  Blood cultures positive for Streptococcus pneumoniae.  Repeat blood cultures are negative.  Patient had thoracentesis of 600 mL underneath the left lung on 11/5 and another 800 mL underneath the left lung on 11/7.  Case discussed with ID and will continue IV Rocephin through the weekend and reassess.  Patient may be able to go home on oral Levaquin.

## 2023-04-21 NOTE — Consult Note (Signed)
Hospital Consult    Reason for Consult:  Left Wrist Pain AV Fistula Check  Requesting Physician:  Malachi Carl NP  MRN #:  [id number]  History of Present Illness: This is a 47 y.o. female  with medical history significant of ESRD on HD TTS, dilated cardiomyopathy with chronic combined HFrEF and HFpEF with LVEF <15% on echo 2023, nonobstructive CAD, COPD, cirrhosis requiring paracentesis, PE on Eliquis, HIV (CD4=80, Oct/2024) on antiviral treatment and dapsone, HCV presented with worsening of productive cough shortness of breath and left-sided chest pain. Onset 2d prior, progressed to productive cough w/ blood tinged mucus, L chest pain. EMS called. Febrile, tachycardic, SpO2 87% RA.   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   a.) occluded anterior tibial, tibioperoneal trunk, and posterior tibial arteries --> thromboembolectomy and PTA of the anterior tibial artery   CAD (coronary artery disease) 02/15/2019   a.) R/LHC 02/15/2019: mild (non-obstructive) diffuse LAD and LCx disease   COPD (chronic obstructive pulmonary disease) (HCC)    COVID    Dilated cardiomyopathy (HCC)    a.) TTE 11/27/2013: EF 20%; b.) TTE 03/15/2015: EF 20%; c.) TTE 12/09/2015: EF 45%; d.) TTE 12/22/2016: EF 35%; e.) TTE 08/08/2017: EF 45-50%; f.) TTE 01/27/2019: EF 20-25%; g.) TTE 01/13/2021: EF <15%; h.) TTE 11/21/2021: EF <15%   ESRD (end stage renal disease) on dialysis (HCC)    a.) T-Th-Sat   GERD (gastroesophageal reflux disease)    Hepatitis C virus    HFrEF (heart failure with reduced ejection fraction) (HCC)    a.) TTE 11/27/2013: EF 20%; b.) TTE 03/15/2015: EF 20%; c.) TTE 12/09/2015: EF 45%; d.) TTE 12/22/2016: EF 35%, G1DD; e.) TTE 08/08/2017: EF 45-50%, G1DD; f.) TTE 01/27/2019: EF 20-25%, G1DD; g.) R/LHC 02/15/2019: mPA 25, mPCWP 13, CO 4.84, CI 3.15; h.) TTE 01/13/2021: EF <15%, sev glob HK, sep/apical AK, mod asym LVH, sev LAE, mild RAE,  G2DD; I.) TTE 11/21/2021: EF <15%, G2DD   History of noncompliance with medical treatment    HIV (human immunodeficiency virus infection) (HCC) 2015   a.) on bictegravir-emtricitabine-tenofovir (Biktarvy)   Hypertension    Insomnia    a.) on Trazodone PRN   LBBB (left bundle branch block)    Liver disease, chronic, with cirrhosis (HCC)    Long term current use of anticoagulant    a.) apixaban   Pancytopenia (HCC)    Prolonged Q-T interval on ECG    Splenomegaly    Tobacco abuse     Past Surgical History:  Procedure Laterality Date   A/V FISTULAGRAM Left 03/31/2022   Procedure: A/V Fistulagram;  Surgeon: Renford Dills, MD;  Location: [facility];  Service: Cardiovascular;  Laterality: Left;   AV FISTULA PLACEMENT Left 02/12/2023   Procedure: LEFT ARM FISTULA CREATION;  Surgeon: Nada Libman, MD;  Location: [facility] OR;  Service: Vascular;  Laterality: Left;   CHOLECYSTECTOMY     LOWER EXTREMITY ANGIOGRAPHY Left 01/25/2019   Procedure: Lower Extremity Angiography;  Surgeon: Renford Dills, MD;  Location: [facility];  Service: Cardiovascular;  Laterality: Left;   RIGHT/LEFT HEART CATH AND CORONARY ANGIOGRAPHY Bilateral 02/15/2019   Procedure: RIGHT/LEFT HEART CATH AND CORONARY ANGIOGRAPHY;  Surgeon: Antonieta Iba, MD;  Location: [facility];  Service: Cardiovascular;  Laterality: Bilateral;    Allergies  Allergen Reactions   Neosporin [Bacitracin-Polymyxin B] Rash   Sulfa Antibiotics Rash    Prior to Admission medications  Medication Sig Start Date End Date Taking? Authorizing Provider  apixaban (ELIQUIS) 5 MG TABS tablet Take 5 mg by mouth 2 (two) times daily. 08/04/22  Yes [provider]  bictegravir-emtricitabine-tenofovir AF (BIKTARVY) 50-200-25 MG TABS tablet Take 1 tablet by mouth at bedtime.   Yes [provider]  calcium acetate (PHOSLO) 667 MG capsule Take 2,001 mg by mouth 3 (three) times daily. 04/22/21  Yes  [provider]  dapsone 25 MG tablet Take 25 mg by mouth 2 (two) times daily.   Yes [provider]  gabapentin (NEURONTIN) 300 MG capsule Take 300 mg by mouth at bedtime. 03/12/22  Yes [provider]  metoprolol succinate (TOPROL-XL) 25 MG 24 hr tablet Take 1 tablet by mouth daily. 12/30/22 12/30/23 Yes [provider]  oxyCODONE-acetaminophen (PERCOCET) 5-325 MG tablet Take 1 tablet by mouth every 4 (four) hours as needed for severe pain. 02/12/23 02/12/24 Yes Nada Libman, MD  pantoprazole (PROTONIX) 40 MG tablet Take 40 mg by mouth daily. 04/25/22  Yes [provider]  prednisoLONE acetate (PRED FORTE) 1 % ophthalmic suspension Place 1 drop into the right eye daily as needed (Torn lens). 07/16/21  Yes [provider]  torsemide (DEMADEX) 20 MG tablet Take 2 tablets by mouth daily. 03/19/23  Yes [provider]  traZODone (DESYREL) 50 MG tablet Take 50 mg by mouth at bedtime as needed for sleep. 03/24/22  Yes [provider]    Social History   Socioeconomic History   Marital status: Single    Spouse name: Not on file   Number of children: Not on file   Years of education: Not on file   Highest education level: Not on file  Occupational History   Not on file  Tobacco Use   Smoking status: Every Day    Current packs/day: 0.25    Types: Cigarettes   Smokeless tobacco: Never   Tobacco comments:    5 cigarettes a day  Vaping Use   Vaping status: Never Used  Substance and Sexual Activity   Alcohol use: No   Drug use: No   Sexual activity: Not Currently  Other Topics Concern   Not on file  Social History Narrative   Not on file   Social Determinants of Health   Financial Resource Strain: Low Risk  (12/29/2022)   Received from [facility]   Overall Financial Resource Strain (CARDIA)    Difficulty of Paying Living Expenses: Not hard at all  Food Insecurity: No Food Insecurity (04/17/2023)    Hunger Vital Sign    Worried About Running Out of Food in the Last Year: Never true    Ran Out of Food in the Last Year: Never true  Transportation Needs: No Transportation Needs (04/17/2023)   PRAPARE - Administrator, Civil Service (Medical): No    Lack of Transportation (Non-Medical): No  Physical Activity: Inactive (04/26/2019)   Exercise Vital Sign    Days of Exercise per Week: 0 days    Minutes of Exercise per Session: 0 min  Stress: No Stress Concern Present (04/26/2019)   [employer] of Occupational Health - Occupational Stress Questionnaire    Feeling of Stress : Not at all  Social Connections: Moderately Isolated (04/26/2019)   Social Connection and Isolation Panel [NHANES]    Frequency of Communication with Friends and Family: More than three times a week    Frequency of Social Gatherings with Friends and Family: More than three times  a week    Attends Religious Services: 1 to 4 times per year    Active Member of Clubs or Organizations: No    Attends Banker Meetings: Never    Marital Status: Never married  Intimate Partner Violence: Not At Risk (04/17/2023)   Humiliation, Afraid, Rape, and Kick questionnaire    Fear of Current or Ex-Partner: No    Emotionally Abused: No    Physically Abused: No    Sexually Abused: No     Family History  Problem Relation Age of Onset   Hypertension Mother    Cerebral aneurysm Mother     ROS: Otherwise negative unless mentioned in HPI  Physical Examination  Vitals:   04/21/23 1223 04/21/23 1657  BP: 108/72 109/70  Pulse: 85 (!) 49  Resp: 16 20  Temp: 98.5 F (36.9 C) 98.5 F (36.9 C)  SpO2: 100% (!) 79%   Body mass index is 16.25 kg/m.  General:  WDWN in NAD Gait: Not observed HENT: WNL, normocephalic Pulmonary: normal non-labored breathing, without Rales, rhonchi,  wheezing Cardiac: {Desc; regular/irreg:14544}, without  Murmurs, rubs or gallops; {With/Without:20273} carotid  bruits Abdomen: Positive Bowel Sounds, soft, NT/ND, no masses Skin: without rashes Vascular Exam/Pulses: *** Extremities: without ischemic changes, without Gangrene , without cellulitis; without open wounds;  Musculoskeletal: no muscle wasting or atrophy  Neurologic: A&O X 3;  No focal weakness or paresthesias are detected; speech is fluent/normal Psychiatric:  The pt has Normal affect. Lymph:  Unremarkable  CBC    Component Value Date/Time   WBC 5.0 04/20/2023 0443   RBC 2.57 (L) 04/20/2023 0443   HGB 8.6 (L) 04/20/2023 0443   HGB 9.1 (L) 04/18/2023 1516   HCT 26.5 (L) 04/20/2023 0443   HCT 27.9 (L) 04/18/2023 1516   PLT 76 (L) 04/20/2023 0443   PLT 76 (L) 04/18/2023 1516   MCV 103.1 (H) 04/20/2023 0443   MCV 100 (H) 04/18/2023 1516   MCH 33.5 04/20/2023 0443   MCHC 32.5 04/20/2023 0443   RDW 16.4 (H) 04/20/2023 0443   RDW 15.7 (H) 04/18/2023 1516   LYMPHSABS 0.4 (L) 04/18/2023 1516   MONOABS 0.3 10/04/2022 2202   EOSABS 0.1 04/18/2023 1516   BASOSABS 0.0 04/18/2023 1516    BMET    Component Value Date/Time   NA 129 (L) 04/21/2023 0459   NA 132 (L) 03/06/2019 1418   K 4.1 04/21/2023 0459   CL 92 (L) 04/21/2023 0459   CO2 27 04/21/2023 0459   GLUCOSE 88 04/21/2023 0459   BUN 80 (H) 04/21/2023 0459   BUN 41 (H) 03/06/2019 1418   CREATININE 7.72 (H) 04/21/2023 0459   CALCIUM 8.0 (L) 04/21/2023 0459   GFRNONAA 6 (L) 04/21/2023 0459   GFRAA 16 (L) 06/13/2019 1150    COAGS: Lab Results  Component Value Date   INR 1.5 (H) 10/04/2022   INR 1.6 (H) 03/10/2022   INR >10.0 (HH) 03/07/2022     Non-Invasive Vascular Imaging:   EXAM:04/17/23 CT CHEST WITHOUT CONTRAST   TECHNIQUE: Multidetector CT imaging of the chest was performed following the standard protocol without IV contrast.   RADIATION DOSE REDUCTION: This exam was performed according to the departmental dose-optimization program which includes automated exposure control, adjustment of the mA and/or kV  according to patient size and/or use of iterative reconstruction technique.   COMPARISON:  01/17/2022   FINDINGS: Cardiovascular: Extensive multi-vessel coronary artery calcification. Moderate cardiomegaly is again identified with biventricular enlargement. No pericardial effusion. The central pulmonary  arteries are enlarged in keeping with changes of pulmonary arterial hypertension. Mild atherosclerotic calcification within the thoracic aorta. No aortic aneurysm. Right internal jugular hemodialysis catheter tip noted within the right atrium.   Mediastinum/Nodes: Visualized thyroid is unremarkable. Shotty mediastinal adenopathy, predominantly within the left prevascular and right paratracheal region is nonspecific, possibly reactive in nature. This appears progressive since prior examination. The esophagus is unremarkable.   Lungs/Pleura: Moderate left pleural effusion is present with compressive atelectasis of the left lung base, slightly enlarged since prior examination. There is, however, superimposed dense consolidation within the left lower lobe and basilar lingula in keeping with changes of acute lobar pneumonia in the appropriate clinical setting. There is chronic consolidation within the lingula medially, stable since prior examination, likely post inflammatory in nature. Stable 4 mm subpleural pulmonary nodule within the right upper lobe, axial image # 70/4, safely considered benign. No pneumothorax or pleural effusion on the right. There is extensive airway impaction within the left lower lobe and basilar lingula. No central obstructing mass.   Upper Abdomen: At least moderate ascites noted within the visualized upper abdomen. Stable splenomegaly, incompletely evaluated on this examination.   Musculoskeletal: No acute bone abnormality. No lytic or blastic bone lesion.   IMPRESSION: 1. Dense consolidation within the left lower lobe and basilar lingula in keeping  with changes of acute lobar pneumonia in the appropriate clinical setting. Extensive airway impaction within the left lower lobe and basilar lingula. No central obstructing mass. 2. Moderate left pleural effusion with compressive atelectasis of the left lung base, slightly enlarged since prior examination. 3. Extensive multi-vessel coronary artery calcification. Moderate cardiomegaly with biventricular enlargement. 4. Morphologic changes in keeping with pulmonary arterial hypertension. 5. At least moderate ascites within the visualized upper abdomen. Stable splenomegaly. Together, these findings may reflect the sequela of portal venous hypertension, though this is incompletely evaluated on this exam.  Statin:  No. Beta Blocker:  Yes.   Aspirin:  No. ACEI:  No. ARB:  No. CCB use:  No Other antiplatelets/anticoagulants:  Yes.   Eliquis 5 mg twice daily   ASSESSMENT/PLAN: This is a 47 y.o. female presented with worsening of productive cough shortness of breath and left-sided chest pain.   PLAN:    -***   Marcie [facility] 04/21/2023 5:03 PM

## 2023-04-21 NOTE — Progress Notes (Signed)
Tx started after Chante;; Cleotis Nipper, NP assessed pt. C/o chest pains but has now reported they have subsided. Will monitor.

## 2023-04-22 ENCOUNTER — Inpatient Hospital Stay: Payer: 59

## 2023-04-22 DIAGNOSIS — B20 Human immunodeficiency virus [HIV] disease: Secondary | ICD-10-CM | POA: Diagnosis not present

## 2023-04-22 DIAGNOSIS — J90 Pleural effusion, not elsewhere classified: Secondary | ICD-10-CM | POA: Diagnosis not present

## 2023-04-22 DIAGNOSIS — D62 Acute posthemorrhagic anemia: Secondary | ICD-10-CM | POA: Diagnosis not present

## 2023-04-22 DIAGNOSIS — E875 Hyperkalemia: Secondary | ICD-10-CM | POA: Diagnosis not present

## 2023-04-22 DIAGNOSIS — B953 Streptococcus pneumoniae as the cause of diseases classified elsewhere: Secondary | ICD-10-CM | POA: Diagnosis not present

## 2023-04-22 DIAGNOSIS — R7881 Bacteremia: Secondary | ICD-10-CM | POA: Diagnosis not present

## 2023-04-22 DIAGNOSIS — A403 Sepsis due to Streptococcus pneumoniae: Secondary | ICD-10-CM | POA: Diagnosis not present

## 2023-04-22 DIAGNOSIS — J181 Lobar pneumonia, unspecified organism: Secondary | ICD-10-CM | POA: Diagnosis not present

## 2023-04-22 LAB — CBC
HCT: 19.9 % — ABNORMAL LOW (ref 36.0–46.0)
Hemoglobin: 6.2 g/dL — ABNORMAL LOW (ref 12.0–15.0)
MCH: 33 pg (ref 26.0–34.0)
MCHC: 31.2 g/dL (ref 30.0–36.0)
MCV: 105.9 fL — ABNORMAL HIGH (ref 80.0–100.0)
Platelets: 74 10*3/uL — ABNORMAL LOW (ref 150–400)
RBC: 1.88 MIL/uL — ABNORMAL LOW (ref 3.87–5.11)
RDW: 15.9 % — ABNORMAL HIGH (ref 11.5–15.5)
WBC: 3.6 10*3/uL — ABNORMAL LOW (ref 4.0–10.5)
nRBC: 0 % (ref 0.0–0.2)

## 2023-04-22 LAB — BASIC METABOLIC PANEL
Anion gap: 7 (ref 5–15)
BUN: 45 mg/dL — ABNORMAL HIGH (ref 6–20)
CO2: 28 mmol/L (ref 22–32)
Calcium: 8.1 mg/dL — ABNORMAL LOW (ref 8.9–10.3)
Chloride: 94 mmol/L — ABNORMAL LOW (ref 98–111)
Creatinine, Ser: 4.35 mg/dL — ABNORMAL HIGH (ref 0.44–1.00)
GFR, Estimated: 12 mL/min — ABNORMAL LOW (ref >60)
Glucose, Bld: 82 mg/dL (ref 70–99)
Potassium: 3.9 mmol/L (ref 3.5–5.1)
Sodium: 129 mmol/L — ABNORMAL LOW (ref 135–145)

## 2023-04-22 LAB — HEMOGLOBIN: Hemoglobin: 8.6 g/dL — ABNORMAL LOW (ref 12.0–15.0)

## 2023-04-22 LAB — BODY FLUID CELL COUNT WITH DIFFERENTIAL
Eos, Fluid: 0 %
Lymphs, Fluid: 47 %
Monocyte-Macrophage-Serous Fluid: 16 %
Neutrophil Count, Fluid: 37 %
Total Nucleated Cell Count, Fluid: 3222 uL

## 2023-04-22 LAB — ABO/RH: ABO/RH(D): A POS

## 2023-04-22 LAB — EXPECTORATED SPUTUM ASSESSMENT W GRAM STAIN, RFLX TO RESP C

## 2023-04-22 LAB — LACTATE DEHYDROGENASE, PLEURAL OR PERITONEAL FLUID: LD, Fluid: 288 U/L — ABNORMAL HIGH (ref 3–23)

## 2023-04-22 LAB — PREPARE RBC (CROSSMATCH)

## 2023-04-22 LAB — PROTEIN, PLEURAL OR PERITONEAL FLUID: Total protein, fluid: 4 g/dL

## 2023-04-22 LAB — VITAMIN B12: Vitamin B-12: 412 pg/mL (ref 180–914)

## 2023-04-22 MED ORDER — ACETAMINOPHEN 325 MG PO TABS
650.0000 mg | ORAL_TABLET | Freq: Once | ORAL | Status: DC
  Filled 2023-04-22: qty 2

## 2023-04-22 MED ORDER — LIDOCAINE-PRILOCAINE 2.5-2.5 % EX CREA: 1.0000 | TOPICAL_CREAM | CUTANEOUS | Status: DC | PRN

## 2023-04-22 MED ORDER — TORSEMIDE 20 MG PO TABS
40.0000 mg | ORAL_TABLET | Freq: Every day | ORAL | Status: DC
  Administered 2023-04-23 – 2023-04-26 (×4): 40 mg via ORAL
  Filled 2023-04-22 (×4): qty 2

## 2023-04-22 MED ORDER — LIDOCAINE-PRILOCAINE 2.5-2.5 % EX CREA
1.0000 | TOPICAL_CREAM | CUTANEOUS | Status: DC | PRN
  Administered 2023-04-23: 1 via TOPICAL
  Filled 2023-04-22: qty 5

## 2023-04-22 MED ORDER — FUROSEMIDE 10 MG/ML IJ SOLN
80.0000 mg | Freq: Once | INTRAMUSCULAR | Status: AC
  Administered 2023-04-22: 80 mg via INTRAVENOUS
  Filled 2023-04-22: qty 8

## 2023-04-22 MED ORDER — SODIUM CHLORIDE 0.9% IV SOLUTION: Freq: Once | INTRAVENOUS | Status: AC

## 2023-04-22 MED ORDER — PANTOPRAZOLE SODIUM 40 MG IV SOLR
40.0000 mg | Freq: Two times a day (BID) | INTRAVENOUS | Status: DC
  Administered 2023-04-22 – 2023-04-26 (×9): 40 mg via INTRAVENOUS
  Filled 2023-04-22 (×8): qty 10

## 2023-04-22 MED ORDER — LIDOCAINE HCL (PF) 1 % IJ SOLN
10.0000 mL | Freq: Once | INTRAMUSCULAR | Status: AC
  Administered 2023-04-22: 10 mL via INTRADERMAL
  Filled 2023-04-22: qty 10

## 2023-04-22 NOTE — Progress Notes (Signed)
Date of Admission:  04/17/2023     ID: Rebecca Bridges is a 47 y.o. female Principal Problem:   Acute blood loss anemia Active Problems:   HIV (human immunodeficiency virus infection) (HCC)   Hyponatremia   Pleural effusion, left   Acute on chronic systolic CHF (congestive heart failure) (HCC)   End-stage renal disease on hemodialysis (HCC)   Hyperkalemia   Sepsis due to Streptococcus pneumoniae (HCC)   Pressure injury of skin   Epistaxis   Bacteremia due to Streptococcus pneumoniae    Subjective: Pt had pleurocentesis today and it had to be stopped because of pain Coughing up blood stained sputum  Medications:   acetaminophen  650 mg Oral Once   alum & mag hydroxide-simeth  30 mL Oral Once   bictegravir-emtricitabine-tenofovir AF  1 tablet Oral Daily   calcium acetate  2,001 mg Oral TID AC   Chlorhexidine Gluconate Cloth  6 each Topical Q0600   dapsone  50 mg Oral BID   gabapentin  300 mg Oral QHS   metoprolol succinate  12.5 mg Oral Daily   pantoprazole (PROTONIX) IV  40 mg Intravenous Q12H   [START ON 04/23/2023] torsemide  40 mg Oral Daily    Objective: Vital signs in last 24 hours: Patient Vitals for the past 24 hrs:  BP Temp Temp src Pulse Resp SpO2  04/22/23 1600 121/72 -- -- 90 -- 100 %  04/22/23 1542 116/80 -- -- 90 -- 100 %  04/22/23 1540 121/72 -- -- 90 -- 100 %  04/22/23 1232 116/85 98.1 F (36.7 C) Oral 92 18 100 %  04/22/23 1034 113/77 97.7 F (36.5 C) -- 92 20 --  04/22/23 1019 (!) 100/58 97.9 F (36.6 C) Oral 78 20 --  04/22/23 0956 -- -- -- 89 -- 100 %  04/22/23 0828 125/88 (!) 97.5 F (36.4 C) -- 96 18 100 %  04/22/23 0418 100/73 97.8 F (36.6 C) -- 89 -- 91 %  04/22/23 0004 110/74 (!) 97.4 F (36.3 C) -- 86 18 91 %  04/22/23 0004 -- (!) 96.6 F (35.9 C) Axillary -- -- --  04/21/23 1950 111/74 98.4 F (36.9 C) Oral 95 18 98 %      PHYSICAL EXAM:  General: Alert, cooperative, tearful Lungs: Cb/l air entry dre=creased left base- crepts  bases. Heart: s1s2 Abdomen: Soft,  distended. Bowel sounds normal. No masses Extremities: atraumatic, no cyanosis. No edema. No clubbing Skin: No rashes or lesions. Or bruising Lymph: Cervical, supraclavicular normal. Neurologic: Grossly non-focal Rt internal jugular cath  Lab Results    Latest Ref Rng & Units 04/22/2023    2:30 PM 04/22/2023    4:06 AM 04/20/2023    4:43 AM  CBC  WBC 4.0 - 10.5 K/uL  3.6  5.0   Hemoglobin 12.0 - 15.0 g/dL 8.6  6.2  8.6   Hematocrit 36.0 - 46.0 %  19.9  26.5   Platelets 150 - 400 K/uL  74  76        Latest Ref Rng & Units 04/22/2023    4:06 AM 04/21/2023    4:59 AM 04/20/2023    4:43 AM  CMP  Glucose 70 - 99 mg/dL 82  88  94   BUN 6 - 20 mg/dL 45  80  52   Creatinine 0.44 - 1.00 mg/dL 2.13  0.86  5.78   Sodium 135 - 145 mmol/L 129  129  126   Potassium 3.5 - 5.1 mmol/L 3.9  4.1  4.1   Chloride 98 - 111 mmol/L 94  92  90   CO2 22 - 32 mmol/L 28  27  25    Calcium 8.9 - 10.3 mg/dL 8.1  8.0  7.7       Microbiology: BC on 04/17/23 - Strep pneumo 04/20/23 BC NG Studies/Results: US THORACENTESIS ASP PLEURAL SPACE W/IMG GUIDE  Result Date: 04/22/2023 INDICATION: Patient with a history of end-stage renal disease presents with pleural effusion secondary to pneumonia. Diagnostic and therapeutic thoracentesis requested. EXAM: ULTRASOUND GUIDED DIAGNOSTIC and THERAPEUTIC THORACENTESIS MEDICATIONS: 1% lidocaine 10 mL COMPLICATIONS: None immediate. PROCEDURE: An ultrasound guided thoracentesis was thoroughly discussed with the patient and questions answered. The benefits, risks, alternatives and complications were also discussed. The patient understands and wishes to proceed with the procedure. Written consent was obtained. Ultrasound was performed to localize and mark an adequate pocket of fluid in the LEFT chest. The area was then prepped and draped in the normal sterile fashion. 1% Lidocaine was used for local anesthesia. Under ultrasound guidance a 6 Fr  Safe-T-Centesis catheter was introduced. Thoracentesis was performed. Procedure stopped early due to patient complaint of discomfort. The catheter was removed and a dressing applied. FINDINGS: A total of approximately 800 mL of blood-tinged fluid was removed. Samples were sent to the laboratory as requested by the clinical team. IMPRESSION: Successful ultrasound guided diagnostic and therapeutic LEFT thoracentesis yielding 800 mL of serosanguineous pleural fluid. Procedure performed by Alwyn Ren NP Electronically Signed   By: Roanna Banning M.D.   On: 04/22/2023 16:29   DG Chest Port 1 View  Result Date: 04/22/2023 CLINICAL DATA:  [id number] Back pain [id number] Status post thoracentesis [id number] EXAM: PORTABLE CHEST 1 VIEW COMPARISON:  IR thoracentesis, earlier same day. Chest XR, 04/20/2023 and 04/17/2023. CT chest, 04/17/2023. FINDINGS: Support lines: RIGHT chest dialysis catheter catheter tip within the proximal RA. Overlying cutaneous leads. Enlarged cardiac silhouette. Minimal perihilar interstitial thickening. The RIGHT lung is relatively clear. Mildly improved aeration of the LEFT chest with small volume residual LEFT pleural effusion. No pneumothorax. LEFT basilar consolidation. No interval osseous abnormality. IMPRESSION: 1. Lines and tubes as above 2. Improved aeration of the LEFT chest post thoracentesis with mild residual pleural effusion. No pneumothorax. 3. LEFT basilar consolidation, favor atelectasis however superimposed infection can appear similar. Electronically Signed   By: Roanna Banning M.D.   On: 04/22/2023 16:25   CT RENAL STONE STUDY  Result Date: 04/22/2023 CLINICAL DATA:  Left flank pain with hematuria. EXAM: CT ABDOMEN AND PELVIS WITHOUT CONTRAST TECHNIQUE: Multidetector CT imaging of the abdomen and pelvis was performed following the standard protocol without IV contrast. RADIATION DOSE REDUCTION: This exam was performed according to the departmental dose-optimization program which  includes automated exposure control, adjustment of the mA and/or kV according to patient size and/or use of iterative reconstruction technique. COMPARISON:  May 28, 2022. FINDINGS: Lower chest: Moderate size left pleural effusion is noted with associated left basilar atelectasis or infiltrate. Hepatobiliary: No focal liver abnormality is seen. Status post cholecystectomy. No biliary dilatation. Pancreas: Unremarkable. No pancreatic ductal dilatation or surrounding inflammatory changes. Spleen: Mild splenomegaly. Adrenals/Urinary Tract: Adrenal glands appear normal. Bilateral renal atrophy is noted. No hydronephrosis or renal obstruction is noted. Urinary bladder is unremarkable. Stomach/Bowel: Stomach is within normal limits. Appendix appears normal. No evidence of bowel wall thickening, distention, or inflammatory changes. Vascular/Lymphatic: Aortic atherosclerosis. No enlarged abdominal or pelvic lymph nodes. Reproductive: Uterus and bilateral adnexa are unremarkable. Other: Mild ascites is noted.  No hernia is noted. Musculoskeletal: No acute or significant osseous findings. IMPRESSION: Moderate size left pleural effusion is noted with associated left basilar atelectasis or infiltrate. Mild splenomegaly. Mild ascites. Bilateral renal atrophy is noted suggesting end-stage renal disease. Aortic Atherosclerosis (ICD10-I70.0). Electronically Signed   By: Lupita Raider M.D.   On: 04/22/2023 13:07     Assessment/Plan: 47 year old female with advanced AIDS presents with shortness of breath left-sided chest pain and fever   Pneumococcal bacteremia with  pneumonia on the left side Left pleural effusion parapneumonic effusion/empyema Pt is at risk for Pneumococcal infection due to immune suppression from AIDS and not being compliant with meds  The risk for endocarditis is  low because of an identified source for the bacteremia , which is pneumonia and also strep pneumo causes < 3% of IE I dont think the  dialysis cath is  the original source for the bacteremia , it is the pneumonia of the left lower lobe,though no sputum culture was sent which would have corroborated the pneumonia to be due to strep pneumo There are  no signs of septic emboli to the lungs ( no multiple cavitary lesions or nodules) from a tricupsid vegetation due to Dialysis cath but she has some hemoptysis - Pros and cons of doing TEE discussed. Will discuss with team tomorrow  Vascular planning to remove the catheter if the fistula works- IF the cath tip culture is positive for pneumococcus then could do TEE   Continue ceftriaxone for 2 weeks minimum ( 11/17)  , may need more if the empyema persist or she develops   other findings ( septic emboli, or cath tip culture positive, or new fever) that would point to a longer course of antibiotic   AIDS.  On Biktarvy but nonadherent Need to follow up with ID [facility]   End-stage renal disease on dialysis   Hepatitis C Second line of treatment.  Patient initially took Mavyret but did not complete Then she went on sofosbuvir/velpatasvir.  Patient has not taken it regularly and has not completed this   Ascites needing frequent paracentesis.  Also splenomegaly, pancytopenia - cirrhosis not proven but very likely  Anemia  Discussed the management with the patient in detail

## 2023-04-22 NOTE — Progress Notes (Signed)
[facility]  PROGRESS NOTE   Subjective:   Patient seen laying in bed States she had a rough night reports vaginal bleeding throughout the night Also reports lightheadedness and dizziness.  Objective:  Vital signs: Blood pressure 116/80, pulse 90, temperature 98.1 F (36.7 C), temperature source Oral, resp. rate 18, height 5\' 3"  (1.6 m), weight 41.6 kg, last menstrual period 05/09/2019, SpO2 100%.  Intake/Output Summary (Last 24 hours) at 04/22/2023 1644 Last data filed at 04/22/2023 1300 Gross per 24 hour  Intake 372 ml  Output --  Net 372 ml   Filed Weights   04/17/23 1656 04/17/23 1946 04/17/23 2128  Weight: 55 kg 53 kg 41.6 kg     Physical Exam: General:  No acute distress  Head:  Normocephalic, atraumatic. Moist oral mucosal membranes  Eyes:  Anicteric  Lungs:   Clear to auscultation, normal effort  Heart:  S1S2 no rubs  Abdomen:   Soft, nontender, bowel sounds present  Extremities:  No peripheral edema.  Neurologic:  Awake, alert, following commands  Skin:  No lesions  Access: Rt permcath, Lt AVF (maturing)    Basic Metabolic Panel: Recent Labs  Lab 04/18/23 0215 04/19/23 0409 04/20/23 0443 04/21/23 0459 04/22/23 0406  NA 128* 125* 126* 129* 129*  K 4.3 3.9 4.1 4.1 3.9  CL 94* 90* 90* 92* 94*  CO2 26 24 25 27 28   GLUCOSE 119* 100* 94 88 82  BUN 21* 39* 52* 80* 45*  CREATININE 4.11* 5.43* 6.48* 7.72* 4.35*  CALCIUM 7.7* 7.9* 7.7* 8.0* 8.1*   GFR: Estimated Creatinine Clearance: 10.6 mL/min (A) (by C-G formula based on SCr of 4.35 mg/dL (H)).  Liver Function Tests: Recent Labs  Lab 04/17/23 1423 04/17/23 2207  AST 33 37  ALT 19 16  ALKPHOS 79 68  BILITOT 1.7* 1.7*  PROT 8.9* 8.1  ALBUMIN 2.8* 2.5*   No results for input(s): "LIPASE", "AMYLASE" in the last 168 hours. No results for input(s): "AMMONIA" in the last 168 hours.  CBC: Recent Labs  Lab 04/18/23 0215 04/18/23 1516 04/19/23 0409 04/20/23 0443 04/22/23 0406  04/22/23 1430  WBC 10.1 6.6 5.1 5.0 3.6*  --   NEUTROABS  --  5.8  --   --   --   --   HGB 9.7* 9.1* 8.6* 8.6* 6.2* 8.6*  HCT 30.2* 27.9* 26.8* 26.5* 19.9*  --   MCV 104.9* 100* 103.9* 103.1* 105.9*  --   PLT 74* 76* 68* 76* 74*  --      HbA1C: Hgb A1c MFr Bld  Date/Time Value Ref Range Status  01/26/2019 04:49 PM 5.5 4.8 - 5.6 % Final    Comment:    (NOTE) Pre diabetes:          5.7%-6.4% Diabetes:              >6.4% Glycemic control for   <7.0% adults with diabetes     Urinalysis: No results for input(s): "COLORURINE", "LABSPEC", "PHURINE", "GLUCOSEU", "HGBUR", "BILIRUBINUR", "KETONESUR", "PROTEINUR", "UROBILINOGEN", "NITRITE", "LEUKOCYTESUR" in the last 72 hours.  Invalid input(s): "APPERANCEUR"    Imaging: US THORACENTESIS ASP PLEURAL SPACE W/IMG GUIDE  Result Date: 04/22/2023 INDICATION: Patient with a history of end-stage renal disease presents with pleural effusion secondary to pneumonia. Diagnostic and therapeutic thoracentesis requested. EXAM: ULTRASOUND GUIDED DIAGNOSTIC and THERAPEUTIC THORACENTESIS MEDICATIONS: 1% lidocaine 10 mL COMPLICATIONS: None immediate. PROCEDURE: An ultrasound guided thoracentesis was thoroughly discussed with the patient and questions answered. The benefits, risks, alternatives and complications were  also discussed. The patient understands and wishes to proceed with the procedure. Written consent was obtained. Ultrasound was performed to localize and mark an adequate pocket of fluid in the LEFT chest. The area was then prepped and draped in the normal sterile fashion. 1% Lidocaine was used for local anesthesia. Under ultrasound guidance a 6 Fr Safe-T-Centesis catheter was introduced. Thoracentesis was performed. Procedure stopped early due to patient complaint of discomfort. The catheter was removed and a dressing applied. FINDINGS: A total of approximately 800 mL of blood-tinged fluid was removed. Samples were sent to the laboratory as requested  by the clinical team. IMPRESSION: Successful ultrasound guided diagnostic and therapeutic LEFT thoracentesis yielding 800 mL of serosanguineous pleural fluid. Procedure performed by Alwyn Ren NP Electronically Signed   By: Roanna Banning M.D.   On: 04/22/2023 16:29   DG Chest Port 1 View  Result Date: 04/22/2023 CLINICAL DATA:  [id number] Back pain [id number] Status post thoracentesis [id number] EXAM: PORTABLE CHEST 1 VIEW COMPARISON:  IR thoracentesis, earlier same day. Chest XR, 04/20/2023 and 04/17/2023. CT chest, 04/17/2023. FINDINGS: Support lines: RIGHT chest dialysis catheter catheter tip within the proximal RA. Overlying cutaneous leads. Enlarged cardiac silhouette. Minimal perihilar interstitial thickening. The RIGHT lung is relatively clear. Mildly improved aeration of the LEFT chest with small volume residual LEFT pleural effusion. No pneumothorax. LEFT basilar consolidation. No interval osseous abnormality. IMPRESSION: 1. Lines and tubes as above 2. Improved aeration of the LEFT chest post thoracentesis with mild residual pleural effusion. No pneumothorax. 3. LEFT basilar consolidation, favor atelectasis however superimposed infection can appear similar. Electronically Signed   By: Roanna Banning M.D.   On: 04/22/2023 16:25   CT RENAL STONE STUDY  Result Date: 04/22/2023 CLINICAL DATA:  Left flank pain with hematuria. EXAM: CT ABDOMEN AND PELVIS WITHOUT CONTRAST TECHNIQUE: Multidetector CT imaging of the abdomen and pelvis was performed following the standard protocol without IV contrast. RADIATION DOSE REDUCTION: This exam was performed according to the departmental dose-optimization program which includes automated exposure control, adjustment of the mA and/or kV according to patient size and/or use of iterative reconstruction technique. COMPARISON:  May 28, 2022. FINDINGS: Lower chest: Moderate size left pleural effusion is noted with associated left basilar atelectasis or infiltrate.  Hepatobiliary: No focal liver abnormality is seen. Status post cholecystectomy. No biliary dilatation. Pancreas: Unremarkable. No pancreatic ductal dilatation or surrounding inflammatory changes. Spleen: Mild splenomegaly. Adrenals/Urinary Tract: Adrenal glands appear normal. Bilateral renal atrophy is noted. No hydronephrosis or renal obstruction is noted. Urinary bladder is unremarkable. Stomach/Bowel: Stomach is within normal limits. Appendix appears normal. No evidence of bowel wall thickening, distention, or inflammatory changes. Vascular/Lymphatic: Aortic atherosclerosis. No enlarged abdominal or pelvic lymph nodes. Reproductive: Uterus and bilateral adnexa are unremarkable. Other: Mild ascites is noted.  No hernia is noted. Musculoskeletal: No acute or significant osseous findings. IMPRESSION: Moderate size left pleural effusion is noted with associated left basilar atelectasis or infiltrate. Mild splenomegaly. Mild ascites. Bilateral renal atrophy is noted suggesting end-stage renal disease. Aortic Atherosclerosis (ICD10-I70.0). Electronically Signed   By: Lupita Raider M.D.   On: 04/22/2023 13:07     Medications:    cefTRIAXone (ROCEPHIN)  IV 2 g (04/22/23 1307)    acetaminophen  650 mg Oral Once   alum & mag hydroxide-simeth  30 mL Oral Once   bictegravir-emtricitabine-tenofovir AF  1 tablet Oral Daily   calcium acetate  2,001 mg Oral TID AC   Chlorhexidine Gluconate Cloth  6 each Topical Q0600  dapsone  50 mg Oral BID   gabapentin  300 mg Oral QHS   metoprolol succinate  12.5 mg Oral Daily   pantoprazole (PROTONIX) IV  40 mg Intravenous Q12H   [START ON 04/23/2023] torsemide  40 mg Oral Daily    Assessment/ Plan:     47 year old female with history of hypertension, coronary artery disease, congestive heart failure, HIV, cirrhosis of the liver, end-stage renal disease on dialysis now admitted with history of shortness of breath, cough, fever. She is found to have fluid overload and  also possible pneumonia. She is on a Tuesday Thursday Saturday schedule for dialysis.   [facility]/TTS/right chest PermCath   #1: ESRD on hemodialysis: Dialysis deferred today due to workup.  Patient did receive dialysis yesterday, tolerated well.  Next treatment scheduled for Friday.  Appreciate vascular surgery evaluating left aVF, will attempt to use this during next dialysis treatment.  Will also order ultrasound of AVF to evaluate patency.  #2: Sepsis/community-acquired pneumonia: Continue Rocephin per primary team.  #3: Hypotension: Blood pressure 116/80 today.  Receiving metoprolol and torsemide.   #4: Secondary hyperparathyroidism: Will monitor bone minerals during this admission.  Calcium acceptable.  Continue calcium acetate.  #5: Anemia with chronic kidney disease:  Patient receives Mircera at outpatient clinic.  Patient has experienced a decrease in hemoglobin during this admission.  CT renal stone shows a moderate size left pleural effusion with mild ascites.  Left thoracentesis performed with 800 mL blood-tinged fluid removed.  Patient received 1 unit blood transfusion.    LOS: 5 [facility] 11/7/20244:44 PM

## 2023-04-22 NOTE — Progress Notes (Signed)
Progress Note   Patient: Rebecca Bridges ZOX:[id number] DOB: Jan 16, 1976 DOA: 04/17/2023     5 DOS: the patient was seen and examined on 04/22/2023   Brief hospital course: HPI: Rebecca Bridges is a 47 y.o. female with medical history significant of ESRD on HD TTS, dilated cardiomyopathy with chronic combined HFrEF and HFpEF with LVEF <15% on echo 2023, nonobstructive CAD, COPD, cirrhosis requiring paracentesis, PE on Eliquis, HIV (CD4=80, Oct/2024) on antiviral treatment and dapsone, HCV presented with worsening of productive cough shortness of breath and left-sided chest pain. Onset 2d prior, progressed to productive cough w/ blood tinged mucus, L chest pain. EMS called. Febrile, tachycardic, SpO2 87% RA.   Hospital course / significant events:  11/02: to ED, CXR (+)L PNA and pleural effusion, admitted to hospitalist service for sepsis  11/03: (+)BCx strep pneumo. Echo ordered, likely will end up needing TEE, will continue abx and involve ID, chest pain PM and no EKG changes and troponin elevated but downtrending 11/04: ID consult pending, echo done and read is pending, BP stable, no further chest pain 11/05: echo - EF stable 20-25% severe hypokinesis/strain. Thoracentesis removed 600 mL 11/6.  Patient feeling better today but did have a bloody nose this morning and had low blood pressure 11/7.  Patient stated she had some black stools overnight.  Hemoglobin this morning 6.2.  A unit of blood ordered.  Benefits and risk explained to patient.  Eliquis stopped.  IV Protonix ordered.  CT scan of the abdomen did not show any retroperitoneal bleed.  Will order a thoracentesis.  Consultants:  Infectious disease Advanced heart failure   Procedures/Surgeries: Thoracentesis on 11/5.            Assessment and Plan: * Acute blood loss anemia Patient complaining of back pain and found to have a hemoglobin of 6.2.  Patient states that she had some black stools overnight.  Had nosebleed the other night.   Eliquis placed on hold.  No further black stools.  Placed on IV Protonix.  CT abdomen did not show any retroperitoneal bleed.  Benefits and risk of blood transfusion explained to patient.  Sepsis due to Streptococcus pneumoniae (HCC) Severe sepsis, present on admission with fever, tachycardia, left-sided pneumonia with pleural effusion, lactic acidosis.  Blood cultures positive for Streptococcus pneumoniae.  Repeat blood cultures are negative.  Patient had thoracentesis of 600 mL underneath the left lung on 11/5.  Pleural effusion, left Thoracentesis removed 600 mL underneath the left lung on 11/5.  No growth less than in 2 days.  Patient having back pain today.  Will repeat a thoracentesis since CT scan showed moderate pleural effusion.  Acute on chronic systolic CHF (congestive heart failure) (HCC) Dialysis to manage fluid, last EF 20 to 25% with moderate mitral regurgitation.  Patient on Toprol-XL.  IV Lasix with blood transfusion.  End-stage renal disease on hemodialysis [facility]) Patient had dialysis yesterday  HIV (human immunodeficiency virus infection) (HCC) CD4 count 47.  Patient on Biktarvy.  Patient on dapsone.  Hyperkalemia On presentation.  Improved with dialysis.  Pressure injury of skin Present on admission, sacrum stage I.  See full description below.  Epistaxis Advised not to blow her nose.  No further nosebleeds.  Hold Eliquis.  As needed Afrin nasal spray.  Hyponatremia Dialysis to manage        Subjective: Patient stated she had some black stools overnight.  Eliquis discontinued this morning.  Ordered for a unit of blood on a hemoglobin of 6.2.  No nausea  or vomiting.  Patient having a lot of pain in her back.  Was unable to sleep last night secondary to the pain.  Physical Exam: Vitals:   04/22/23 0956 04/22/23 1019 04/22/23 1034 04/22/23 1232  BP:  (!) 100/58 113/77 116/85  Pulse: 89 78 92 92  Resp:  20 20 18   Temp:  97.9 F (36.6 C) 97.7 F (36.5 C) 98.1  F (36.7 C)  TempSrc:  Oral  Oral  SpO2: 100%   100%  Weight:      Height:       Physical Exam HENT:     Head: Normocephalic.     Nose:     Comments: No bleeding seen.    Mouth/Throat:     Pharynx: No oropharyngeal exudate.  Eyes:     General: Lids are normal.     Conjunctiva/sclera: Conjunctivae normal.  Cardiovascular:     Rate and Rhythm: Normal rate and regular rhythm.     Heart sounds: S1 normal and S2 normal. Murmur heard.     Systolic murmur is present with a grade of 3/6.  Pulmonary:     Breath sounds: Examination of the left-lower field reveals decreased breath sounds and rhonchi. Decreased breath sounds and rhonchi present. No wheezing or rales.  Abdominal:     Palpations: Abdomen is soft.     Tenderness: There is no abdominal tenderness.  Musculoskeletal:     Right lower leg: No swelling.     Left lower leg: No swelling.     Comments: Pain to palpation on left back.  Skin:    General: Skin is warm.     Findings: No rash.  Neurological:     Mental Status: She is alert and oriented to person, place, and time.     Data Reviewed: Hemoglobin this morning 6.2, sodium 129 creatinine 4.35, vitamin B12 412, platelet count 74 CT abdomen does not show retroperitoneal bleed.  Shows a moderate left pleural effusion mild splenomegaly and mild ascites and atherosclerotic aorta  Family Communication: Declined  Disposition: Status is: Inpatient Remains inpatient appropriate because: Will repeat a thoracentesis since patient having a lot of back pain.  Planned Discharge Destination: Home    Time spent: 28 minutes  Author: Alford Highland, MD 04/22/2023 2:26 PM  For on call review [email or web address].

## 2023-04-22 NOTE — Assessment & Plan Note (Addendum)
Hemoglobin upon discharge 9.0.  Patient received 2 units of packed red blood cells during the hospital course.  Patient states that she has had no further bleeding.  Patient wants to hold Eliquis upon discharge for now.  CT abdomen did not show any retroperitoneal bleed.  Patient did cough up a little bloody sputum.  Continue Protonix upon discharge.

## 2023-04-22 NOTE — Procedures (Signed)
PROCEDURE SUMMARY:  Successful US guided left thoracentesis. Yielded 800 ml of blood-tinged fluid. Pt tolerated procedure well. No immediate complications.  Specimen  sent for labs. CXR ordered; results pending  EBL < 2 mL  Mickie Kay, NP 04/22/2023 3:51 PM

## 2023-04-22 NOTE — Plan of Care (Signed)
  Problem: Pain Management: Goal: General experience of comfort will improve Outcome: Progressing   Problem: Coping: Goal: Level of anxiety will decrease Outcome: Progressing   Problem: Safety: Goal: Ability to remain free from injury will improve Outcome: Progressing   Problem: Skin Integrity: Goal: Risk for impaired skin integrity will decrease Outcome: Progressing

## 2023-04-23 ENCOUNTER — Inpatient Hospital Stay: Payer: 59

## 2023-04-23 DIAGNOSIS — B20 Human immunodeficiency virus [HIV] disease: Secondary | ICD-10-CM | POA: Diagnosis not present

## 2023-04-23 DIAGNOSIS — N186 End stage renal disease: Secondary | ICD-10-CM | POA: Diagnosis not present

## 2023-04-23 DIAGNOSIS — R7881 Bacteremia: Secondary | ICD-10-CM | POA: Diagnosis not present

## 2023-04-23 DIAGNOSIS — B953 Streptococcus pneumoniae as the cause of diseases classified elsewhere: Secondary | ICD-10-CM | POA: Diagnosis not present

## 2023-04-23 DIAGNOSIS — D62 Acute posthemorrhagic anemia: Secondary | ICD-10-CM | POA: Diagnosis not present

## 2023-04-23 DIAGNOSIS — I5023 Acute on chronic systolic (congestive) heart failure: Secondary | ICD-10-CM | POA: Diagnosis not present

## 2023-04-23 DIAGNOSIS — R1030 Lower abdominal pain, unspecified: Secondary | ICD-10-CM

## 2023-04-23 DIAGNOSIS — A403 Sepsis due to Streptococcus pneumoniae: Secondary | ICD-10-CM | POA: Diagnosis not present

## 2023-04-23 DIAGNOSIS — D696 Thrombocytopenia, unspecified: Secondary | ICD-10-CM

## 2023-04-23 DIAGNOSIS — J90 Pleural effusion, not elsewhere classified: Secondary | ICD-10-CM | POA: Diagnosis not present

## 2023-04-23 LAB — CBC
HCT: 24.3 % — ABNORMAL LOW (ref 36.0–46.0)
Hemoglobin: 7.8 g/dL — ABNORMAL LOW (ref 12.0–15.0)
MCH: 32.9 pg (ref 26.0–34.0)
MCHC: 32.1 g/dL (ref 30.0–36.0)
MCV: 102.5 fL — ABNORMAL HIGH (ref 80.0–100.0)
Platelets: 91 10*3/uL — ABNORMAL LOW (ref 150–400)
RBC: 2.37 MIL/uL — ABNORMAL LOW (ref 3.87–5.11)
RDW: 17.4 % — ABNORMAL HIGH (ref 11.5–15.5)
WBC: 4.2 10*3/uL (ref 4.0–10.5)
nRBC: 0 % (ref 0.0–0.2)

## 2023-04-23 LAB — PHOSPHORUS: Phosphorus: 3 mg/dL (ref 2.5–4.6)

## 2023-04-23 LAB — BASIC METABOLIC PANEL
Anion gap: 9 (ref 5–15)
BUN: 42 mg/dL — ABNORMAL HIGH (ref 6–20)
CO2: 26 mmol/L (ref 22–32)
Calcium: 8 mg/dL — ABNORMAL LOW (ref 8.9–10.3)
Chloride: 92 mmol/L — ABNORMAL LOW (ref 98–111)
Creatinine, Ser: 4.33 mg/dL — ABNORMAL HIGH (ref 0.44–1.00)
GFR, Estimated: 12 mL/min — ABNORMAL LOW (ref >60)
Glucose, Bld: 108 mg/dL — ABNORMAL HIGH (ref 70–99)
Potassium: 3.6 mmol/L (ref 3.5–5.1)
Sodium: 127 mmol/L — ABNORMAL LOW (ref 135–145)

## 2023-04-23 LAB — CHOLESTEROL, BODY FLUID: Cholesterol, Fluid: 34 mg/dL

## 2023-04-23 LAB — BODY FLUID CULTURE W GRAM STAIN: Culture: NO GROWTH

## 2023-04-23 MED ORDER — OXYCODONE-ACETAMINOPHEN 5-325 MG PO TABS
ORAL_TABLET | ORAL | Status: AC
  Filled 2023-04-23: qty 1

## 2023-04-23 MED ORDER — PENTAFLUOROPROP-TETRAFLUOROETH EX AERO: 1.0000 | INHALATION_SPRAY | CUTANEOUS | Status: DC | PRN

## 2023-04-23 NOTE — Plan of Care (Signed)
  Problem: Pain Management: Goal: General experience of comfort will improve Outcome: Progressing   Problem: Safety: Goal: Ability to remain free from injury will improve Outcome: Progressing   Problem: Elimination: Goal: Will not experience complications related to urinary retention Outcome: Progressing   Problem: Coping: Goal: Level of anxiety will decrease Outcome: Progressing   Problem: Activity: Goal: Risk for activity intolerance will decrease Outcome: Progressing   Problem: Clinical Measurements: Goal: Ability to maintain clinical measurements within normal limits will improve Outcome: Progressing

## 2023-04-23 NOTE — Progress Notes (Signed)
Progress Note   Patient: Rebecca Bridges UJW:[id number] DOB: 02-Nov-1975 DOA: 04/17/2023     6 DOS: the patient was seen and examined on 04/23/2023   Brief hospital course: HPI: [name] is a 47 y.o. female with medical history significant of ESRD on HD TTS, dilated cardiomyopathy with chronic combined HFrEF and HFpEF with LVEF <15% on echo 2023, nonobstructive CAD, COPD, cirrhosis requiring paracentesis, PE on Eliquis, HIV (CD4=80, Oct/2024) on antiviral treatment and dapsone, HCV presented with worsening of productive cough shortness of breath and left-sided chest pain. Onset 2d prior, progressed to productive cough w/ blood tinged mucus, L chest pain. EMS called. Febrile, tachycardic, SpO2 87% RA.   Hospital course / significant events:  11/02: to ED, CXR (+)L PNA and pleural effusion, admitted to hospitalist service for sepsis  11/03: (+)BCx strep pneumo. Echo ordered, likely will end up needing TEE, will continue abx and involve ID, chest pain PM and no EKG changes and troponin elevated but downtrending 11/04: ID consult pending, echo done and read is pending, BP stable, no further chest pain 11/05: echo - EF stable 20-25% severe hypokinesis/strain. Thoracentesis removed 600 mL 11/6.  Patient feeling better today but did have a bloody nose this morning and had low blood pressure 11/7.  Patient stated she had some black stools overnight.  Hemoglobin this morning 6.2.  A unit of blood ordered.  Benefits and risk explained to patient.  Eliquis stopped.  IV Protonix ordered.  CT scan of the abdomen did not show any retroperitoneal bleed.  Thoracentesis drew off another 800 mL as of bloody tinged fluid. 11/8.  Patient still having back pain.  Interested in a paracentesis.  Ultrasound paracentesis ordered.  Hemoglobin 7.8.  Consultants:  Infectious disease Advanced heart failure   Procedures/Surgeries: Thoracentesis on 11/5, 11/7.            Assessment and Plan: * Acute blood loss  anemia Hemoglobin 7.8.  Patient states that she has had no further bleeding.  Eliquis still on hold.  Had nosebleed the other night and black stool. No further black stools.  Continue on IV Protonix.  CT abdomen did not show any retroperitoneal bleed.  Check hemoglobin again tomorrow.  Sepsis due to Streptococcus pneumoniae (HCC) Severe sepsis, present on admission with fever, tachycardia, left-sided pneumonia with pleural effusion, lactic acidosis.  Blood cultures positive for Streptococcus pneumoniae.  Repeat blood cultures are negative.  Patient had thoracentesis of 600 mL underneath the left lung on 11/5 and another 800 mL underneath the left lung on 11/7.  Case discussed with ID and will continue IV Rocephin through the weekend and reassess.  Patient may be able to go home on oral Levaquin.  Pleural effusion, left Parapneumonic effusion.  Thoracentesis removed 600 mL underneath the left lung on 11/5 and another 800 mL underneath the left lung on 11/7.  Acute on chronic systolic CHF (congestive heart failure) (HCC) Dialysis to manage fluid, last EF 20 to 25% with moderate mitral regurgitation.  Patient on Toprol-XL and torsemide.  End-stage renal disease on hemodialysis [facility]) Patient seen on dialysis today  Hyperkalemia On presentation.  Improved with dialysis.  Pressure injury of skin Present on admission, sacrum stage I.  See full description below.  AIDS (acquired immune deficiency syndrome) (HCC) CD4 count 47, patient on Biktarvy and dapsone.  Epistaxis Advised not to blow her nose.  No further nosebleeds.  Hold Eliquis.  As needed Afrin nasal spray.  Abdominal pain Left-sided abdominal pain and back pain.  Patient  interested in a paracentesis.  Ultrasound paracentesis ordered.  Patient does have mild ascites on CT scan.  Thrombocytopenia (HCC) Platelet count 91.  Chronic in nature  Hyponatremia Dialysis to manage        Subjective: Patient still having back pain.   Patient seen down on dialysis.  Patient interested in a paracentesis.  Physical Exam: Vitals:   04/23/23 0930 04/23/23 1000 04/23/23 1030 04/23/23 1100  BP: 107/72 (!) 140/125 111/78 112/70  Pulse: 94 86 77 81  Resp: 16 16 (!) 22 (!) 21  Temp:      TempSrc:      SpO2: 100% 100% 100% 100%  Weight:      Height:       Physical Exam HENT:     Head: Normocephalic.     Nose:     Comments: No bleeding seen.    Mouth/Throat:     Pharynx: No oropharyngeal exudate.  Eyes:     General: Lids are normal.     Conjunctiva/sclera: Conjunctivae normal.  Cardiovascular:     Rate and Rhythm: Normal rate and regular rhythm.     Heart sounds: S1 normal and S2 normal. Murmur heard.     Systolic murmur is present with a grade of 2/6.  Pulmonary:     Breath sounds: Examination of the left-lower field reveals decreased breath sounds and rhonchi. Decreased breath sounds and rhonchi present. No wheezing or rales.  Abdominal:     Palpations: Abdomen is soft.     Tenderness: There is no abdominal tenderness.  Musculoskeletal:     Right lower leg: No swelling.     Left lower leg: No swelling.     Comments: Pain to palpation on left back.  Skin:    General: Skin is warm.     Findings: No rash.  Neurological:     Mental Status: She is alert and oriented to person, place, and time.     Data Reviewed: Sodium 127, creatinine 4.33, hemoglobin 7.8, platelet count 91   Disposition: Status is: Inpatient Remains inpatient appropriate because: Will give IV antibiotics through the weekend and reassess.  Will order an ultrasound paracentesis.  Planned Discharge Destination: Home    Time spent: 28 minutes  Author: Alford Highland, MD 04/23/2023 11:59 AM  For on call review [email or web address].

## 2023-04-23 NOTE — Progress Notes (Signed)
[facility]  PROGRESS NOTE   Subjective:   Patient seen and evaluated during dialysis   HEMODIALYSIS FLOWSHEET:  Blood Flow Rate (mL/min): 349 mL/min Arterial Pressure (mmHg): -236.55 mmHg Venous Pressure (mmHg): 136.56 mmHg TMP (mmHg): 9.9 mmHg Ultrafiltration Rate (mL/min): 501 mL/min Dialysate Flow Rate (mL/min): 300 ml/min Dialysis Fluid Bolus: Normal Saline  Initially started treatment using AVF, good stick and access was functioning well. Patient began coughing and infiltrated venous. Treatment completed with permcath.   Objective:  Vital signs: Blood pressure 112/70, pulse 81, temperature 98.5 F (36.9 C), temperature source Oral, resp. rate (!) 21, height 5\' 3"  (1.6 m), weight 52.6 kg, last menstrual period 05/09/2019, SpO2 100%.  Intake/Output Summary (Last 24 hours) at 04/23/2023 1145 Last data filed at 04/23/2023 0300 Gross per 24 hour  Intake 612 ml  Output --  Net 612 ml   Filed Weights   04/17/23 1946 04/17/23 2128 04/23/23 0750  Weight: 53 kg 41.6 kg 52.6 kg     Physical Exam: General:  No acute distress  Head:  Normocephalic, atraumatic. Moist oral mucosal membranes  Eyes:  Anicteric  Lungs:   Clear to auscultation, normal effort  Heart:  S1S2 no rubs  Abdomen:   Soft, nontender, bowel sounds present  Extremities:  No peripheral edema.  Neurologic:  Awake, alert, following commands  Skin:  No lesions  Access: Rt permcath, Lt AVF     Basic Metabolic Panel: Recent Labs  Lab 04/19/23 0409 04/20/23 0443 04/21/23 0459 04/22/23 0406 04/23/23 0835  NA 125* 126* 129* 129* 127*  K 3.9 4.1 4.1 3.9 3.6  CL 90* 90* 92* 94* 92*  CO2 24 25 27 28 26   GLUCOSE 100* 94 88 82 108*  BUN 39* 52* 80* 45* 42*  CREATININE 5.43* 6.48* 7.72* 4.35* 4.33*  CALCIUM 7.9* 7.7* 8.0* 8.1* 8.0*   GFR: Estimated Creatinine Clearance: 13.4 mL/min (A) (by C-G formula based on SCr of 4.33 mg/dL (H)).  Liver Function Tests: Recent Labs  Lab 04/17/23 1423  04/17/23 2207  AST 33 37  ALT 19 16  ALKPHOS 79 68  BILITOT 1.7* 1.7*  PROT 8.9* 8.1  ALBUMIN 2.8* 2.5*   No results for input(s): "LIPASE", "AMYLASE" in the last 168 hours. No results for input(s): "AMMONIA" in the last 168 hours.  CBC: Recent Labs  Lab 04/18/23 1516 04/19/23 0409 04/20/23 0443 04/22/23 0406 04/22/23 1430 04/23/23 0835  WBC 6.6 5.1 5.0 3.6*  --  4.2  NEUTROABS 5.8  --   --   --   --   --   HGB 9.1* 8.6* 8.6* 6.2* 8.6* 7.8*  HCT 27.9* 26.8* 26.5* 19.9*  --  24.3*  MCV 100* 103.9* 103.1* 105.9*  --  102.5*  PLT 76* 68* 76* 74*  --  91*     HbA1C: Hgb A1c MFr Bld  Date/Time Value Ref Range Status  01/26/2019 04:49 PM 5.5 4.8 - 5.6 % Final    Comment:    (NOTE) Pre diabetes:          5.7%-6.4% Diabetes:              >6.4% Glycemic control for   <7.0% adults with diabetes     Urinalysis: No results for input(s): "COLORURINE", "LABSPEC", "PHURINE", "GLUCOSEU", "HGBUR", "BILIRUBINUR", "KETONESUR", "PROTEINUR", "UROBILINOGEN", "NITRITE", "LEUKOCYTESUR" in the last 72 hours.  Invalid input(s): "APPERANCEUR"    Imaging: [place] UPPER EXTREMITY ARTERIAL LEFT LIMITED (GRAFT,SINGLE VESSEL)  Result Date: 04/23/2023 CLINICAL DATA:  Evaluate for patency  of left upper extremity arteriovenous fistula. EXAM: LEFT UPPER EXTREMITY ARTERIAL DUPLEX SCAN TECHNIQUE: Gray-scale sonography as well as color Doppler and duplex ultrasound was performed to evaluate the arteries of the upper extremity. COMPARISON:  None Available. FINDINGS: Left forearm arteriovenous fistula is patent. Atherosclerotic changes in the left radial artery. Outflow vein is patent throughout the forearm. Outflow vein is patent at the antecubital fossa. Velocities were not obtained within the outflow vein. IMPRESSION: Left upper extremity arteriovenous fistula is patent. Electronically Signed   By: Richarda Overlie M.D.   On: 04/23/2023 09:37   US THORACENTESIS ASP PLEURAL SPACE W/IMG GUIDE  Result Date:  04/22/2023 INDICATION: Patient with a history of end-stage renal disease presents with pleural effusion secondary to pneumonia. Diagnostic and therapeutic thoracentesis requested. EXAM: ULTRASOUND GUIDED DIAGNOSTIC and THERAPEUTIC THORACENTESIS MEDICATIONS: 1% lidocaine 10 mL COMPLICATIONS: None immediate. PROCEDURE: An ultrasound guided thoracentesis was thoroughly discussed with the patient and questions answered. The benefits, risks, alternatives and complications were also discussed. The patient understands and wishes to proceed with the procedure. Written consent was obtained. Ultrasound was performed to localize and mark an adequate pocket of fluid in the LEFT chest. The area was then prepped and draped in the normal sterile fashion. 1% Lidocaine was used for local anesthesia. Under ultrasound guidance a 6 Fr Safe-T-Centesis catheter was introduced. Thoracentesis was performed. Procedure stopped early due to patient complaint of discomfort. The catheter was removed and a dressing applied. FINDINGS: A total of approximately 800 mL of blood-tinged fluid was removed. Samples were sent to the laboratory as requested by the clinical team. IMPRESSION: Successful ultrasound guided diagnostic and therapeutic LEFT thoracentesis yielding 800 mL of serosanguineous pleural fluid. Procedure performed by Alwyn Ren NP Electronically Signed   By: Roanna Banning M.D.   On: 04/22/2023 16:29   DG Chest Port 1 View  Result Date: 04/22/2023 CLINICAL DATA:  91320 Back pain [id number] Status post thoracentesis [id number] EXAM: PORTABLE CHEST 1 VIEW COMPARISON:  IR thoracentesis, earlier same day. Chest XR, 04/20/2023 and 04/17/2023. CT chest, 04/17/2023. FINDINGS: Support lines: RIGHT chest dialysis catheter catheter tip within the proximal RA. Overlying cutaneous leads. Enlarged cardiac silhouette. Minimal perihilar interstitial thickening. The RIGHT lung is relatively clear. Mildly improved aeration of the LEFT chest with  small volume residual LEFT pleural effusion. No pneumothorax. LEFT basilar consolidation. No interval osseous abnormality. IMPRESSION: 1. Lines and tubes as above 2. Improved aeration of the LEFT chest post thoracentesis with mild residual pleural effusion. No pneumothorax. 3. LEFT basilar consolidation, favor atelectasis however superimposed infection can appear similar. Electronically Signed   By: Roanna Banning M.D.   On: 04/22/2023 16:25   CT RENAL STONE STUDY  Result Date: 04/22/2023 CLINICAL DATA:  Left flank pain with hematuria. EXAM: CT ABDOMEN AND PELVIS WITHOUT CONTRAST TECHNIQUE: Multidetector CT imaging of the abdomen and pelvis was performed following the standard protocol without IV contrast. RADIATION DOSE REDUCTION: This exam was performed according to the departmental dose-optimization program which includes automated exposure control, adjustment of the mA and/or kV according to patient size and/or use of iterative reconstruction technique. COMPARISON:  May 28, 2022. FINDINGS: Lower chest: Moderate size left pleural effusion is noted with associated left basilar atelectasis or infiltrate. Hepatobiliary: No focal liver abnormality is seen. Status post cholecystectomy. No biliary dilatation. Pancreas: Unremarkable. No pancreatic ductal dilatation or surrounding inflammatory changes. Spleen: Mild splenomegaly. Adrenals/Urinary Tract: Adrenal glands appear normal. Bilateral renal atrophy is noted. No hydronephrosis or renal obstruction is noted. Urinary bladder is  unremarkable. Stomach/Bowel: Stomach is within normal limits. Appendix appears normal. No evidence of bowel wall thickening, distention, or inflammatory changes. Vascular/Lymphatic: Aortic atherosclerosis. No enlarged abdominal or pelvic lymph nodes. Reproductive: Uterus and bilateral adnexa are unremarkable. Other: Mild ascites is noted.  No hernia is noted. Musculoskeletal: No acute or significant osseous findings. IMPRESSION:  Moderate size left pleural effusion is noted with associated left basilar atelectasis or infiltrate. Mild splenomegaly. Mild ascites. Bilateral renal atrophy is noted suggesting end-stage renal disease. Aortic Atherosclerosis (ICD10-I70.0). Electronically Signed   By: Lupita Raider M.D.   On: 04/22/2023 13:07     Medications:    cefTRIAXone (ROCEPHIN)  IV 2 g (04/22/23 1307)    acetaminophen  650 mg Oral Once   alum & mag hydroxide-simeth  30 mL Oral Once   bictegravir-emtricitabine-tenofovir AF  1 tablet Oral Daily   calcium acetate  2,001 mg Oral TID AC   Chlorhexidine Gluconate Cloth  6 each Topical Q0600   dapsone  50 mg Oral BID   gabapentin  300 mg Oral QHS   metoprolol succinate  12.5 mg Oral Daily   pantoprazole (PROTONIX) IV  40 mg Intravenous Q12H   torsemide  40 mg Oral Daily    Assessment/ Plan:     47 year old female with history of hypertension, coronary artery disease, congestive heart failure, HIV, cirrhosis of the liver, end-stage renal disease on dialysis now admitted with history of shortness of breath, cough, fever. She is found to have fluid overload and also possible pneumonia. She is on a Tuesday Thursday Saturday schedule for dialysis.   [facility]/TTS/right chest PermCath   #1: ESRD on hemodialysis: Patient receiving dialysis treatment today, UF 0. Able to access AVF for treatment until infiltrated after patient had coughing spell. Completed treatment using permcath. Will apply ice to infiltration and allow access to rest. Awaiting ultrasound of access. Next treatment scheduled for Saturday to maintain outpatient schedule.   #2: Sepsis/community-acquired pneumonia: Continue Rocephin per primary team.  #3: Hypotension: Blood pressure 105/78 during dialysis.  Receiving metoprolol and torsemide.   #4: Secondary hyperparathyroidism:  Awaiting updated phosphorus. Continue calcium acetate.  #5: Anemia with chronic kidney disease:  Patient receives  Mircera at outpatient clinic.  CT renal stone shows a moderate size left pleural effusion with mild ascites.  Left thoracentesis performed  on 04/22/23 with 800 mL blood-tinged fluid removed.  Patient received 1 unit blood transfusion yesterday.    LOS: 6 [facility] 11/8/202411:45 AM

## 2023-04-23 NOTE — Progress Notes (Addendum)
1000: patient had a coughing spell and venous infiltrated. Left arm elevated and applied ice. Will use Right Chest PermCath for HD. Patient was seen by HD NP and MD. Patient was cannulated without difficulty at the beginning of treatment.

## 2023-04-23 NOTE — Assessment & Plan Note (Signed)
Platelet count up to 157.  Chronic in nature

## 2023-04-23 NOTE — Progress Notes (Signed)
Reported to oncall provider of 10 beat [address]

## 2023-04-23 NOTE — Progress Notes (Addendum)
Date of Admission:  04/17/2023     ID: Rebecca Bridges is a 47 y.o. female Principal Problem:   Acute blood loss anemia Active Problems:   HIV (human immunodeficiency virus infection) (HCC)   Hyponatremia   Thrombocytopenia (HCC)   Abdominal pain   Pleural effusion, left   Acute on chronic systolic CHF (congestive heart failure) (HCC)   End-stage renal disease on hemodialysis (HCC)   Hyperkalemia   Sepsis due to Streptococcus pneumoniae (HCC)   Pressure injury of skin   Epistaxis   Bacteremia due to Streptococcus pneumoniae   AIDS (acquired immune deficiency syndrome) (HCC)   Lobar pneumonia, unspecified organism (HCC)    Subjective: Ppt says they tried to access left fistula for an hour during dialysis and then it blew up She is feeling better overall Pain left side of chest better  Medications:   acetaminophen  650 mg Oral Once   alum & mag hydroxide-simeth  30 mL Oral Once   bictegravir-emtricitabine-tenofovir AF  1 tablet Oral Daily   calcium acetate  2,001 mg Oral TID AC   Chlorhexidine Gluconate Cloth  6 each Topical Q0600   dapsone  50 mg Oral BID   gabapentin  300 mg Oral QHS   metoprolol succinate  12.5 mg Oral Daily   pantoprazole (PROTONIX) IV  40 mg Intravenous Q12H   torsemide  40 mg Oral Daily    Objective: Vital signs in last 24 hours: Patient Vitals for the past 24 hrs:  BP Temp Temp src Pulse Resp SpO2 Weight  04/23/23 2025 98/69 98.5 F (36.9 C) Oral 84 18 99 % --  04/23/23 1729 -- -- -- -- 20 -- --  04/23/23 1644 -- -- -- -- 18 -- --  04/23/23 1553 103/74 -- -- 100 -- 90 % --  04/23/23 1252 -- -- -- -- -- -- 52.6 kg  04/23/23 1223 122/75 98 F (36.7 C) Oral 93 17 99 % --  04/23/23 1200 105/78 -- -- 90 (!) 24 100 % --  04/23/23 1130 113/77 -- -- 89 (!) 27 100 % --  04/23/23 1100 112/70 -- -- 81 (!) 21 100 % --  04/23/23 1030 111/78 -- -- 77 (!) 22 100 % --  04/23/23 1000 (!) 140/125 -- -- 86 16 100 % --  04/23/23 0930 107/72 -- -- 94 16 100 %  --  04/23/23 0900 109/77 -- -- 94 (!) 25 98 % --  04/23/23 0830 105/76 -- -- 91 20 100 % --  04/23/23 0816 109/77 -- -- 91 16 100 % --  04/23/23 0750 119/82 98.5 F (36.9 C) Oral 99 19 100 % 52.6 kg  04/23/23 0738 109/72 -- -- 96 -- 97 % --  04/23/23 0727 -- -- -- -- 18 -- --  04/23/23 0409 102/73 98.3 F (36.8 C) Oral 75 -- 98 % --  04/22/23 2324 111/75 98.8 F (37.1 C) Oral 95 18 95 % --      PHYSICAL EXAM:  General: Alert, cooperative, cheerful Lungs: Cb/l air entry decreased  left base- crepts bases. Heart: s1s2 Abdomen: Soft,  distended. Bowel sounds normal. No masses Extremities: left foream swelling at the site of fistula Skin: No rashes or lesions. Or bruising Lymph: Cervical, supraclavicular normal. Neurologic: Grossly non-focal Rt internal jugular cath  Lab Results    Latest Ref Rng & Units 04/23/2023    8:35 AM 04/22/2023    2:30 PM 04/22/2023    4:06 AM  CBC  WBC 4.0 -  10.5 K/uL 4.2   3.6   Hemoglobin 12.0 - 15.0 g/dL 7.8  8.6  6.2   Hematocrit 36.0 - 46.0 % 24.3   19.9   Platelets 150 - 400 K/uL 91   74        Latest Ref Rng & Units 04/23/2023    8:35 AM 04/22/2023    4:06 AM 04/21/2023    4:59 AM  CMP  Glucose 70 - 99 mg/dL 409  82  88   BUN 6 - 20 mg/dL 42  45  80   Creatinine 0.44 - 1.00 mg/dL 8.11  9.14  7.82   Sodium 135 - 145 mmol/L 127  129  129   Potassium 3.5 - 5.1 mmol/L 3.6  3.9  4.1   Chloride 98 - 111 mmol/L 92  94  92   CO2 22 - 32 mmol/L 26  28  27    Calcium 8.9 - 10.3 mg/dL 8.0  8.1  8.0       Microbiology: [facility] on 04/17/23 - Strep pneumo 04/20/23 BC NG Pleural fluid culture X 2 neg Sputum culture sent  Studies/Results: [place] ASCITES (ABDOMEN LIMITED)  Result Date: 04/23/2023 INDICATION: Patient with history of end-stage renal disease and recurrent ascites. Request received for therapeutic paracentesis EXAM: ULTRASOUND ABDOMEN LIMITED FINDINGS: Imaging of all 4 quadrants of the abdomen reveal small amount of ascites left lower quadrant.  IMPRESSION: Small amount of ascites seen on ultrasound. After discussion of risks versus benefits of procedure, patient decided to defer paracentesis until a later time. Exam conducted by Mina Marble, PA-C Electronically Signed   By: Olive Bass M.D.   On: 04/23/2023 16:44   [place] UPPER EXTREMITY ARTERIAL LEFT LIMITED (GRAFT,SINGLE VESSEL)  Result Date: 04/23/2023 CLINICAL DATA:  Evaluate for patency of left upper extremity arteriovenous fistula. EXAM: LEFT UPPER EXTREMITY ARTERIAL DUPLEX SCAN TECHNIQUE: Gray-scale sonography as well as color Doppler and duplex ultrasound was performed to evaluate the arteries of the upper extremity. COMPARISON:  None Available. FINDINGS: Left forearm arteriovenous fistula is patent. Atherosclerotic changes in the left radial artery. Outflow vein is patent throughout the forearm. Outflow vein is patent at the antecubital fossa. Velocities were not obtained within the outflow vein. IMPRESSION: Left upper extremity arteriovenous fistula is patent. Electronically Signed   By: Richarda Overlie M.D.   On: 04/23/2023 09:37   US THORACENTESIS ASP PLEURAL SPACE W/IMG GUIDE  Result Date: 04/22/2023 INDICATION: Patient with a history of end-stage renal disease presents with pleural effusion secondary to pneumonia. Diagnostic and therapeutic thoracentesis requested. EXAM: ULTRASOUND GUIDED DIAGNOSTIC and THERAPEUTIC THORACENTESIS MEDICATIONS: 1% lidocaine 10 mL COMPLICATIONS: None immediate. PROCEDURE: An ultrasound guided thoracentesis was thoroughly discussed with the patient and questions answered. The benefits, risks, alternatives and complications were also discussed. The patient understands and wishes to proceed with the procedure. Written consent was obtained. Ultrasound was performed to localize and mark an adequate pocket of fluid in the LEFT chest. The area was then prepped and draped in the normal sterile fashion. 1% Lidocaine was used for local anesthesia. Under ultrasound  guidance a 6 Fr Safe-T-Centesis catheter was introduced. Thoracentesis was performed. Procedure stopped early due to patient complaint of discomfort. The catheter was removed and a dressing applied. FINDINGS: A total of approximately 800 mL of blood-tinged fluid was removed. Samples were sent to the laboratory as requested by the clinical team. IMPRESSION: Successful ultrasound guided diagnostic and therapeutic LEFT thoracentesis yielding 800 mL of serosanguineous pleural fluid. Procedure performed by Alwyn Ren NP  Electronically Signed   By: Roanna Banning M.D.   On: 04/22/2023 16:29   DG Chest Port 1 View  Result Date: 04/22/2023 CLINICAL DATA:  91320 Back pain [id number] Status post thoracentesis [id number] EXAM: PORTABLE CHEST 1 VIEW COMPARISON:  IR thoracentesis, earlier same day. Chest XR, 04/20/2023 and 04/17/2023. CT chest, 04/17/2023. FINDINGS: Support lines: RIGHT chest dialysis catheter catheter tip within the proximal RA. Overlying cutaneous leads. Enlarged cardiac silhouette. Minimal perihilar interstitial thickening. The RIGHT lung is relatively clear. Mildly improved aeration of the LEFT chest with small volume residual LEFT pleural effusion. No pneumothorax. LEFT basilar consolidation. No interval osseous abnormality. IMPRESSION: 1. Lines and tubes as above 2. Improved aeration of the LEFT chest post thoracentesis with mild residual pleural effusion. No pneumothorax. 3. LEFT basilar consolidation, favor atelectasis however superimposed infection can appear similar. Electronically Signed   By: Roanna Banning M.D.   On: 04/22/2023 16:25   CT RENAL STONE STUDY  Result Date: 04/22/2023 CLINICAL DATA:  Left flank pain with hematuria. EXAM: CT ABDOMEN AND PELVIS WITHOUT CONTRAST TECHNIQUE: Multidetector CT imaging of the abdomen and pelvis was performed following the standard protocol without IV contrast. RADIATION DOSE REDUCTION: This exam was performed according to the departmental  dose-optimization program which includes automated exposure control, adjustment of the mA and/or kV according to patient size and/or use of iterative reconstruction technique. COMPARISON:  May 28, 2022. FINDINGS: Lower chest: Moderate size left pleural effusion is noted with associated left basilar atelectasis or infiltrate. Hepatobiliary: No focal liver abnormality is seen. Status post cholecystectomy. No biliary dilatation. Pancreas: Unremarkable. No pancreatic ductal dilatation or surrounding inflammatory changes. Spleen: Mild splenomegaly. Adrenals/Urinary Tract: Adrenal glands appear normal. Bilateral renal atrophy is noted. No hydronephrosis or renal obstruction is noted. Urinary bladder is unremarkable. Stomach/Bowel: Stomach is within normal limits. Appendix appears normal. No evidence of bowel wall thickening, distention, or inflammatory changes. Vascular/Lymphatic: Aortic atherosclerosis. No enlarged abdominal or pelvic lymph nodes. Reproductive: Uterus and bilateral adnexa are unremarkable. Other: Mild ascites is noted.  No hernia is noted. Musculoskeletal: No acute or significant osseous findings. IMPRESSION: Moderate size left pleural effusion is noted with associated left basilar atelectasis or infiltrate. Mild splenomegaly. Mild ascites. Bilateral renal atrophy is noted suggesting end-stage renal disease. Aortic Atherosclerosis (ICD10-I70.0). Electronically Signed   By: Lupita Raider M.D.   On: 04/22/2023 13:07     Assessment/Plan: 47 year old female with advanced AIDS presents with shortness of breath left-sided chest pain and fever   Pneumococcal bacteremia with  pneumonia on the left side Left pleural effusion parapneumonic effusion/empyema Pt is at risk for Pneumococcal infection due to immune suppression from AIDS and not being compliant with meds  The risk for endocarditis is  low because of an identified source for the bacteremia , which is pneumonia and also strep pneumo  causes < 3% of IE I dont think the dialysis cath is  the original source for the bacteremia , it is the pneumonia of the left lower lobe,though no sputum culture was sent which would have corroborated the pneumonia to be due to strep pneumo There are  no signs of septic emboli to the lungs ( no multiple cavitary lesions or nodules) from a tricupsid vegetation due to Dialysis cath but she has some hemoptysis - Pros and cons of doing TEE discussed. Will discuss with team tomorrow  Vascular planning to remove the catheter if the fistula works- IF the cath tip culture is positive for pneumococcus then could do TEE  Continue ceftriaxone for 2 weeks minimum ( 11/17)  , may need more if the empyema persist or she develops   other findings ( septic emboli, or cath tip culture positive, or new fever) that would point to a longer course of antibiotic Levaquin is an oral option   AIDS.  On Biktarvy but nonadherent HIV RNA 134 Cd4 is 42 On dapsone for PCP prophylaxis Mepron is another option Need to follow up with ID at [facility]   End-stage renal disease on dialysis   Hepatitis C Second line of treatment.  Patient initially took Mavyret but did not complete Then she went on sofosbuvir/velpatasvir.  Patient has not taken it regularly and has not completed this   Ascites needing frequent paracentesis.  Also splenomegaly, pancytopenia - cirrhosis not proven but very likely  Anemia  Discussed the management with the patient in detail Discussed with Dr.Wieting  [facility] physician will be covering from this weekend for 2 weeks- they will not routinely see this patient this weekend  call if needed

## 2023-04-23 NOTE — Assessment & Plan Note (Signed)
CD4 count 47, patient on Biktarvy and dapsone.

## 2023-04-23 NOTE — Assessment & Plan Note (Signed)
Left-sided abdominal pain and back pain.  Ultrasound did not show enough fluid to draw off yesterday.  Will start Solu-Medrol just in case inflammatory and as needed muscle relaxant.  Patient on pain medication.

## 2023-04-23 NOTE — Progress Notes (Signed)
Hemodialysis note  Received patient in bed to unit. Alert and oriented.  Informed consent signed and in chart.  Treatment initiated: 0816 Treatment completed: 1222  Patient tolerated well. Transported back to room, alert without acute distress.  Report given to patient's RN.   Access used: Left Arm AVF/ Right Chest permCath  Issues: Patient was cannulated without difficulty at the beginning of treatment  Patient had a coughing spell and venous infiltrated. Left arm elevated and applied ice. Will use Right Chest PermCath for HD. Patient was seen by HD NP and MD. Access    Total UF removed: 0 Medication(s) given:  Percocet 5/325 mg tab PO  Post HD weight: 52.6 kg   [facility]

## 2023-04-24 ENCOUNTER — Inpatient Hospital Stay: Payer: 59

## 2023-04-24 DIAGNOSIS — J90 Pleural effusion, not elsewhere classified: Secondary | ICD-10-CM | POA: Diagnosis not present

## 2023-04-24 DIAGNOSIS — R1032 Left lower quadrant pain: Secondary | ICD-10-CM

## 2023-04-24 DIAGNOSIS — I5023 Acute on chronic systolic (congestive) heart failure: Secondary | ICD-10-CM | POA: Diagnosis not present

## 2023-04-24 DIAGNOSIS — D62 Acute posthemorrhagic anemia: Secondary | ICD-10-CM | POA: Diagnosis not present

## 2023-04-24 DIAGNOSIS — A403 Sepsis due to Streptococcus pneumoniae: Secondary | ICD-10-CM | POA: Diagnosis not present

## 2023-04-24 LAB — CBC
HCT: 24.8 % — ABNORMAL LOW (ref 36.0–46.0)
Hemoglobin: 7.8 g/dL — ABNORMAL LOW (ref 12.0–15.0)
MCH: 32.8 pg (ref 26.0–34.0)
MCHC: 31.5 g/dL (ref 30.0–36.0)
MCV: 104.2 fL — ABNORMAL HIGH (ref 80.0–100.0)
Platelets: 94 10*3/uL — ABNORMAL LOW (ref 150–400)
RBC: 2.38 MIL/uL — ABNORMAL LOW (ref 3.87–5.11)
RDW: 16.9 % — ABNORMAL HIGH (ref 11.5–15.5)
WBC: 5.5 10*3/uL (ref 4.0–10.5)
nRBC: 0 % (ref 0.0–0.2)

## 2023-04-24 LAB — EXPECTORATED SPUTUM ASSESSMENT W GRAM STAIN, RFLX TO RESP C: Special Requests: NORMAL

## 2023-04-24 MED ORDER — MUSCLE RUB 10-15 % EX CREA
TOPICAL_CREAM | CUTANEOUS | Status: DC | PRN
  Filled 2023-04-24: qty 85

## 2023-04-24 MED ORDER — ONDANSETRON HCL 4 MG/2ML IJ SOLN
4.0000 mg | Freq: Once | INTRAMUSCULAR | Status: AC
  Administered 2023-04-24: 4 mg via INTRAVENOUS

## 2023-04-24 MED ORDER — METHYLPREDNISOLONE SODIUM SUCC 40 MG IJ SOLR
40.0000 mg | Freq: Every day | INTRAMUSCULAR | Status: DC
  Administered 2023-04-24 – 2023-04-26 (×3): 40 mg via INTRAVENOUS
  Filled 2023-04-24 (×3): qty 1

## 2023-04-24 MED ORDER — HEPARIN SODIUM (PORCINE) 1000 UNIT/ML DIALYSIS: 1000.0000 [IU] | INTRAMUSCULAR | Status: DC | PRN

## 2023-04-24 MED ORDER — CARISOPRODOL 350 MG PO TABS
350.0000 mg | ORAL_TABLET | Freq: Two times a day (BID) | ORAL | Status: DC | PRN
  Administered 2023-04-26: 350 mg via ORAL
  Filled 2023-04-24 (×3): qty 1

## 2023-04-24 MED ORDER — ALTEPLASE 2 MG IJ SOLR: 2.0000 mg | Freq: Once | INTRAMUSCULAR | Status: DC | PRN

## 2023-04-24 MED ORDER — ONDANSETRON HCL 4 MG/2ML IJ SOLN
INTRAMUSCULAR | Status: AC
  Filled 2023-04-24: qty 2

## 2023-04-24 NOTE — Progress Notes (Signed)
[facility]  PROGRESS NOTE   Subjective:   Patient seen and evaluated during dialysis   HEMODIALYSIS FLOWSHEET:  Blood Flow Rate (mL/min): 399 mL/min Arterial Pressure (mmHg): -171.1 mmHg Venous Pressure (mmHg): 166.46 mmHg TMP (mmHg): 7.68 mmHg Ultrafiltration Rate (mL/min): 419 mL/min Dialysate Flow Rate (mL/min): 300 ml/min Dialysis Fluid Bolus: Normal Saline  States she had a rough night Reports hemoptysis overnight Reports soreness from AVF Intermittent shortness of breath  Objective:  Vital signs: Blood pressure 111/75, pulse 89, temperature 98.8 F (37.1 C), resp. rate 19, height 5\' 3"  (1.6 m), weight 52.6 kg, last menstrual period 05/09/2019, SpO2 100%.  Intake/Output Summary (Last 24 hours) at 04/24/2023 1144 Last data filed at 04/24/2023 1120 Gross per 24 hour  Intake 690 ml  Output 500 ml  Net 190 ml   Filed Weights   04/17/23 2128 04/23/23 0750 04/23/23 1252  Weight: 41.6 kg 52.6 kg 52.6 kg     Physical Exam: General:  No acute distress  Head:  Normocephalic, atraumatic. Moist oral mucosal membranes  Eyes:  Anicteric  Lungs:  Diminished, normal effort  Heart:  S1S2 no rubs  Abdomen:   Soft, nontender, bowel sounds present  Extremities:  No peripheral edema.  Neurologic:  Awake, alert, following commands  Skin:  No lesions  Access: Rt permcath, Lt AVF     Basic Metabolic Panel: Recent Labs  Lab 04/19/23 0409 04/20/23 0443 04/21/23 0459 04/22/23 0406 04/23/23 0835  NA 125* 126* 129* 129* 127*  K 3.9 4.1 4.1 3.9 3.6  CL 90* 90* 92* 94* 92*  CO2 24 25 27 28 26   GLUCOSE 100* 94 88 82 108*  BUN 39* 52* 80* 45* 42*  CREATININE 5.43* 6.48* 7.72* 4.35* 4.33*  CALCIUM 7.9* 7.7* 8.0* 8.1* 8.0*  PHOS  --   --   --   --  3.0   GFR: Estimated Creatinine Clearance: 13.4 mL/min (A) (by C-G formula based on SCr of 4.33 mg/dL (H)).  Liver Function Tests: Recent Labs  Lab 04/17/23 1423 04/17/23 2207  AST 33 37  ALT 19 16  ALKPHOS  79 68  BILITOT 1.7* 1.7*  PROT 8.9* 8.1  ALBUMIN 2.8* 2.5*   No results for input(s): "LIPASE", "AMYLASE" in the last 168 hours. No results for input(s): "AMMONIA" in the last 168 hours.  CBC: Recent Labs  Lab 04/18/23 1516 04/19/23 0409 04/20/23 0443 04/22/23 0406 04/22/23 1430 04/23/23 0835 04/24/23 0433  WBC 6.6 5.1 5.0 3.6*  --  4.2 5.5  NEUTROABS 5.8  --   --   --   --   --   --   HGB 9.1* 8.6* 8.6* 6.2* 8.6* 7.8* 7.8*  HCT 27.9* 26.8* 26.5* 19.9*  --  24.3* 24.8*  MCV 100* 103.9* 103.1* 105.9*  --  102.5* 104.2*  PLT 76* 68* 76* 74*  --  91* 94*     HbA1C: Hgb A1c MFr Bld  Date/Time Value Ref Range Status  01/26/2019 04:49 PM 5.5 4.8 - 5.6 % Final    Comment:    (NOTE) Pre diabetes:          5.7%-6.4% Diabetes:              >6.4% Glycemic control for   <7.0% adults with diabetes     Urinalysis: No results for input(s): "COLORURINE", "LABSPEC", "PHURINE", "GLUCOSEU", "HGBUR", "BILIRUBINUR", "KETONESUR", "PROTEINUR", "UROBILINOGEN", "NITRITE", "LEUKOCYTESUR" in the last 72 hours.  Invalid input(s): "APPERANCEUR"    Imaging: [place] ASCITES (ABDOMEN LIMITED)  Result Date: 04/23/2023 INDICATION: Patient with history of end-stage renal disease and recurrent ascites. Request received for therapeutic paracentesis EXAM: ULTRASOUND ABDOMEN LIMITED FINDINGS: Imaging of all 4 quadrants of the abdomen reveal small amount of ascites left lower quadrant. IMPRESSION: Small amount of ascites seen on ultrasound. After discussion of risks versus benefits of procedure, patient decided to defer paracentesis until a later time. Exam conducted by Mina Marble, PA-C Electronically Signed   By: Olive Bass M.D.   On: 04/23/2023 16:44   [place] UPPER EXTREMITY ARTERIAL LEFT LIMITED (GRAFT,SINGLE VESSEL)  Result Date: 04/23/2023 CLINICAL DATA:  Evaluate for patency of left upper extremity arteriovenous fistula. EXAM: LEFT UPPER EXTREMITY ARTERIAL DUPLEX SCAN TECHNIQUE: Gray-scale  sonography as well as color Doppler and duplex ultrasound was performed to evaluate the arteries of the upper extremity. COMPARISON:  None Available. FINDINGS: Left forearm arteriovenous fistula is patent. Atherosclerotic changes in the left radial artery. Outflow vein is patent throughout the forearm. Outflow vein is patent at the antecubital fossa. Velocities were not obtained within the outflow vein. IMPRESSION: Left upper extremity arteriovenous fistula is patent. Electronically Signed   By: Richarda Overlie M.D.   On: 04/23/2023 09:37   US THORACENTESIS ASP PLEURAL SPACE W/IMG GUIDE  Result Date: 04/22/2023 INDICATION: Patient with a history of end-stage renal disease presents with pleural effusion secondary to pneumonia. Diagnostic and therapeutic thoracentesis requested. EXAM: ULTRASOUND GUIDED DIAGNOSTIC and THERAPEUTIC THORACENTESIS MEDICATIONS: 1% lidocaine 10 mL COMPLICATIONS: None immediate. PROCEDURE: An ultrasound guided thoracentesis was thoroughly discussed with the patient and questions answered. The benefits, risks, alternatives and complications were also discussed. The patient understands and wishes to proceed with the procedure. Written consent was obtained. Ultrasound was performed to localize and mark an adequate pocket of fluid in the LEFT chest. The area was then prepped and draped in the normal sterile fashion. 1% Lidocaine was used for local anesthesia. Under ultrasound guidance a 6 Fr Safe-T-Centesis catheter was introduced. Thoracentesis was performed. Procedure stopped early due to patient complaint of discomfort. The catheter was removed and a dressing applied. FINDINGS: A total of approximately 800 mL of blood-tinged fluid was removed. Samples were sent to the laboratory as requested by the clinical team. IMPRESSION: Successful ultrasound guided diagnostic and therapeutic LEFT thoracentesis yielding 800 mL of serosanguineous pleural fluid. Procedure performed by Alwyn Ren NP  Electronically Signed   By: Roanna Banning M.D.   On: 04/22/2023 16:29   DG Chest Port 1 View  Result Date: 04/22/2023 CLINICAL DATA:  [id number] Back pain [id number] Status post thoracentesis [id number] EXAM: PORTABLE CHEST 1 VIEW COMPARISON:  IR thoracentesis, earlier same day. Chest XR, 04/20/2023 and 04/17/2023. CT chest, 04/17/2023. FINDINGS: Support lines: RIGHT chest dialysis catheter catheter tip within the proximal RA. Overlying cutaneous leads. Enlarged cardiac silhouette. Minimal perihilar interstitial thickening. The RIGHT lung is relatively clear. Mildly improved aeration of the LEFT chest with small volume residual LEFT pleural effusion. No pneumothorax. LEFT basilar consolidation. No interval osseous abnormality. IMPRESSION: 1. Lines and tubes as above 2. Improved aeration of the LEFT chest post thoracentesis with mild residual pleural effusion. No pneumothorax. 3. LEFT basilar consolidation, favor atelectasis however superimposed infection can appear similar. Electronically Signed   By: Roanna Banning M.D.   On: 04/22/2023 16:25     Medications:    cefTRIAXone (ROCEPHIN)  IV 2 g (04/23/23 1410)    acetaminophen  650 mg Oral Once   alum & mag hydroxide-simeth  30 mL Oral Once   bictegravir-emtricitabine-tenofovir AF  1 tablet Oral Daily   calcium acetate  2,001 mg Oral TID AC   Chlorhexidine Gluconate Cloth  6 each Topical Q0600   dapsone  50 mg Oral BID   gabapentin  300 mg Oral QHS   methylPREDNISolone (SOLU-MEDROL) injection  40 mg Intravenous Daily   metoprolol succinate  12.5 mg Oral Daily   pantoprazole (PROTONIX) IV  40 mg Intravenous Q12H   torsemide  40 mg Oral Daily    Assessment/ Plan:     47 year old female with history of hypertension, coronary artery disease, congestive heart failure, HIV, cirrhosis of the liver, end-stage renal disease on dialysis now admitted with history of shortness of breath, cough, fever. She is found to have fluid overload and also possible  pneumonia. She is on a Tuesday Thursday Saturday schedule for dialysis.   [facility]/TTS/right chest PermCath   #1: ESRD on hemodialysis: Access to aVF for treatment yesterday however experienced infiltration after coughing spell.  Intermittent ice applied to area.  Less swollen today.  Utilized PermCath for treatment today.  Appreciate ultrasound confirming patency of aVF.  Next treatment scheduled for Tuesday.  #2: Sepsis/community-acquired pneumonia: Continue Rocephin per primary team.  #3: Hypotension: Blood pressure 93/80 during dialysis.  Continue torsemide and metoprolol as ordered.  #4: Secondary hyperparathyroidism: Calcium and phosphorus acceptable.  Continue calcium acetate.  #5: Anemia with chronic kidney disease:  Patient receives Mircera at outpatient clinic.  CT renal stone shows a moderate size left pleural effusion with mild ascites.  Left thoracentesis performed  on 04/22/23 with 800 mL blood-tinged fluid removed.  Hemoglobin stable at 7.8.    LOS: 7 [facility] 11/9/202411:44 AM

## 2023-04-24 NOTE — Assessment & Plan Note (Signed)
Patient states that she has had worsening headache.  Will get an MRI of the brain for further evaluation.

## 2023-04-24 NOTE — Plan of Care (Signed)

## 2023-04-24 NOTE — Progress Notes (Signed)
Progress Note   Patient: Rebecca Bridges VZD:[id number] DOB: 04-27-1976 DOA: 04/17/2023     7 DOS: the patient was seen and examined on 04/24/2023   Brief hospital course: HPI: Rebecca Bridges is a 47 y.o. female with medical history significant of ESRD on HD TTS, dilated cardiomyopathy with chronic combined HFrEF and HFpEF with LVEF <15% on echo 2023, nonobstructive CAD, COPD, cirrhosis requiring paracentesis, PE on Eliquis, HIV (CD4=80, Oct/2024) on antiviral treatment and dapsone, HCV presented with worsening of productive cough shortness of breath and left-sided chest pain. Onset 2d prior, progressed to productive cough w/ blood tinged mucus, L chest pain. EMS called. Febrile, tachycardic, SpO2 87% RA.   Hospital course / significant events:  11/02: to ED, CXR (+)L PNA and pleural effusion, admitted to hospitalist service for sepsis  11/03: (+)BCx strep pneumo. Echo ordered, likely will end up needing TEE, will continue abx and involve ID, chest pain PM and no EKG changes and troponin elevated but downtrending 11/04: ID consult pending, echo done and read is pending, BP stable, no further chest pain 11/05: echo - EF stable 20-25% severe hypokinesis/strain. Thoracentesis removed 600 mL 11/6.  Patient feeling better today but did have a bloody nose this morning and had low blood pressure 11/7.  Patient stated she had some black stools overnight.  Hemoglobin this morning 6.2.  A unit of blood ordered.  Benefits and risk explained to patient.  Eliquis stopped.  IV Protonix ordered.  CT scan of the abdomen did not show any retroperitoneal bleed.  Thoracentesis drew off another 800 mL as of bloody tinged fluid. 11/8.  Patient still having back pain.  Interested in a paracentesis.  Ultrasound did not show enough fluid in the abdomen.  Hemoglobin 7.8. 11/9.  Hemoglobin stable at 7.8.  Patient still having back pain and left-sided abdominal pain.  CT scan the other day did not show anything acute in the abdomen.   Will start Solu-Medrol and as needed muscle relaxant.  Consultants:  Infectious disease Advanced heart failure   Procedures/Surgeries: Thoracentesis on 11/5, 11/7.            Assessment and Plan: * Acute blood loss anemia Hemoglobin stable at 7.8.  Patient states that she has had no further bleeding.  Eliquis still on hold.  Had nosebleed the other night and black stool. No further black stools.  Continue on IV Protonix.  CT abdomen did not show any retroperitoneal bleed.  Check hemoglobin again tomorrow.  Patient did cough up a little bloody sputum.  Sepsis due to Streptococcus pneumoniae (HCC) Severe sepsis, present on admission with fever, tachycardia, left-sided pneumonia with pleural effusion, lactic acidosis.  Blood cultures positive for Streptococcus pneumoniae.  Repeat blood cultures are negative.  Patient had thoracentesis of 600 mL underneath the left lung on 11/5 and another 800 mL underneath the left lung on 11/7.  Case discussed with ID and will continue IV Rocephin through the weekend and reassess.  Patient may be able to go home on oral Levaquin.  Pleural effusion, left Parapneumonic effusion.  Thoracentesis removed 600 mL underneath the left lung on 11/5 and another 800 mL underneath the left lung on 11/7.  Acute on chronic systolic CHF (congestive heart failure) (HCC) Dialysis to manage fluid, last EF 20 to 25% with moderate mitral regurgitation.  Patient on Toprol-XL and torsemide.  End-stage renal disease on hemodialysis [facility]) Patient seen on dialysis today  Hyperkalemia On presentation.  Improved with dialysis.  Pressure injury of skin Present on  admission, sacrum stage I.  See full description below.  AIDS (acquired immune deficiency syndrome) (HCC) CD4 count 47, patient on Biktarvy and dapsone.  Epistaxis Advised not to blow her nose.  No further nosebleeds.  Hold Eliquis.  As needed Afrin nasal spray.  Abdominal pain Left-sided abdominal pain and  back pain.  Ultrasound did not show enough fluid to draw off yesterday.  Will start Solu-Medrol just in case inflammatory and as needed muscle relaxant.  Patient on pain medication.  Thrombocytopenia (HCC) Platelet count 94.  Chronic in nature  Headache Patient states that she has had worsening headache.  Will get an MRI of the brain for further evaluation.  Hyponatremia Dialysis to manage        Subjective: Patient complains of worsening headache.  She requested an MRI of her entire body.  I explained that she already had a CT scan of the abdomen pelvis and also of the chest which did show good pictures of both.  Still having back pain and left-sided abdominal pain.  Coughed up some blood-tinged sputum.  Physical Exam: Vitals:   04/24/23 1100 04/24/23 1115 04/24/23 1120 04/24/23 1150  BP: 110/72 (!) 101/59 111/75 104/79  Pulse: 85 87 89 92  Resp: 19 15 19    Temp:  98.8 F (37.1 C) 98.8 F (37.1 C)   TempSrc:  Oral    SpO2: 100% 100% 100% 100%  Weight:      Height:       Physical Exam HENT:     Head: Normocephalic.     Nose:     Comments: No bleeding seen.    Mouth/Throat:     Pharynx: No oropharyngeal exudate.  Eyes:     General: Lids are normal.     Conjunctiva/sclera: Conjunctivae normal.  Cardiovascular:     Rate and Rhythm: Normal rate and regular rhythm.     Heart sounds: S1 normal and S2 normal. Murmur heard.     Systolic murmur is present with a grade of 2/6.  Pulmonary:     Breath sounds: Examination of the left-lower field reveals decreased breath sounds and rhonchi. Decreased breath sounds and rhonchi present. No wheezing or rales.  Abdominal:     Palpations: Abdomen is soft.     Tenderness: There is abdominal tenderness in the left lower quadrant.  Musculoskeletal:     Right lower leg: No swelling.     Left lower leg: No swelling.  Skin:    General: Skin is warm.     Findings: No rash.  Neurological:     Mental Status: She is alert and oriented to  person, place, and time.     Data Reviewed: Creatinine 4.33, sodium 127, hemoglobin 7.8, platelet count 94  Disposition: Status is: Inpatient Remains inpatient appropriate because: Continue IV antibiotics here in the hospital  Planned Discharge Destination: Home    Time spent: 28 minutes  Author: Alford Highland, MD 04/24/2023 3:10 PM  For on call review [email or web address].

## 2023-04-24 NOTE — Progress Notes (Signed)
Received patient in bed to unit.    Informed consent signed and in chart.    TX duration: 3 hrs     Transported back to floor  Hand-off given to patient's nurse.   Access used:  rt chest cvc Access issues: n/a  Total UF removed: 500 mls Medication(s) given: Zofran 4mg  IV       Maple Hudson, RN Dialysis Unit

## 2023-04-25 DIAGNOSIS — I5023 Acute on chronic systolic (congestive) heart failure: Secondary | ICD-10-CM | POA: Diagnosis not present

## 2023-04-25 DIAGNOSIS — A403 Sepsis due to Streptococcus pneumoniae: Secondary | ICD-10-CM | POA: Diagnosis not present

## 2023-04-25 DIAGNOSIS — D62 Acute posthemorrhagic anemia: Secondary | ICD-10-CM | POA: Diagnosis not present

## 2023-04-25 DIAGNOSIS — J90 Pleural effusion, not elsewhere classified: Secondary | ICD-10-CM | POA: Diagnosis not present

## 2023-04-25 LAB — CULTURE, RESPIRATORY W GRAM STAIN: Culture: NORMAL

## 2023-04-25 LAB — CULTURE, BLOOD (ROUTINE X 2)
Culture: NO GROWTH
Culture: NO GROWTH

## 2023-04-25 LAB — HEMOGLOBIN: Hemoglobin: 7.3 g/dL — ABNORMAL LOW (ref 12.0–15.0)

## 2023-04-25 LAB — PREPARE RBC (CROSSMATCH)

## 2023-04-25 MED ORDER — FUROSEMIDE 10 MG/ML IJ SOLN
40.0000 mg | Freq: Once | INTRAMUSCULAR | Status: AC
  Administered 2023-04-25: 40 mg via INTRAVENOUS
  Filled 2023-04-25: qty 4

## 2023-04-25 MED ORDER — EPOETIN ALFA 10000 UNIT/ML IJ SOLN: 10000.0000 [IU] | INTRAMUSCULAR | Status: DC

## 2023-04-25 MED ORDER — SODIUM CHLORIDE 0.9% IV SOLUTION: Freq: Once | INTRAVENOUS | Status: AC

## 2023-04-25 NOTE — Progress Notes (Signed)
[facility]  PROGRESS NOTE   Subjective:   Patient seen laying in bed Alert and oriented States he rested well overnight States pain in left upper extremity improved, ice pack in place Denies bleeding  Objective:  Vital signs: Blood pressure 106/68, pulse 83, temperature (!) 97.5 F (36.4 C), resp. rate 20, height 5\' 3"  (1.6 m), weight 52.6 kg, last menstrual period 05/09/2019, SpO2 100%.  Intake/Output Summary (Last 24 hours) at 04/25/2023 1202 Last data filed at 04/24/2023 1300 Gross per 24 hour  Intake 250 ml  Output --  Net 250 ml   Filed Weights   04/17/23 2128 04/23/23 0750 04/23/23 1252  Weight: 41.6 kg 52.6 kg 52.6 kg     Physical Exam: General:  No acute distress  Head:  Normocephalic, atraumatic. Moist oral mucosal membranes  Eyes:  Anicteric  Lungs:  Diminished, normal effort  Heart:  S1S2 no rubs  Abdomen:   Soft, nontender, bowel sounds present  Extremities:  No peripheral edema.  Neurologic:  Awake, alert, following commands  Skin:  No lesions  Access: Rt permcath, Lt AVF     Basic Metabolic Panel: Recent Labs  Lab 04/19/23 0409 04/20/23 0443 04/21/23 0459 04/22/23 0406 04/23/23 0835  NA 125* 126* 129* 129* 127*  K 3.9 4.1 4.1 3.9 3.6  CL 90* 90* 92* 94* 92*  CO2 24 25 27 28 26   GLUCOSE 100* 94 88 82 108*  BUN 39* 52* 80* 45* 42*  CREATININE 5.43* 6.48* 7.72* 4.35* 4.33*  CALCIUM 7.9* 7.7* 8.0* 8.1* 8.0*  PHOS  --   --   --   --  3.0   GFR: Estimated Creatinine Clearance: 13.4 mL/min (A) (by C-G formula based on SCr of 4.33 mg/dL (H)).  Liver Function Tests: No results for input(s): "AST", "ALT", "ALKPHOS", "BILITOT", "PROT", "ALBUMIN" in the last 168 hours.  No results for input(s): "LIPASE", "AMYLASE" in the last 168 hours. No results for input(s): "AMMONIA" in the last 168 hours.  CBC: Recent Labs  Lab 04/18/23 1516 04/19/23 0409 04/19/23 0409 04/20/23 0443 04/22/23 0406 04/22/23 1430 04/23/23 0835  04/24/23 0433 04/25/23 0520  WBC 6.6 5.1  --  5.0 3.6*  --  4.2 5.5  --   NEUTROABS 5.8  --   --   --   --   --   --   --   --   HGB 9.1* 8.6*   < > 8.6* 6.2* 8.6* 7.8* 7.8* 7.3*  HCT 27.9* 26.8*  --  26.5* 19.9*  --  24.3* 24.8*  --   MCV 100* 103.9*  --  103.1* 105.9*  --  102.5* 104.2*  --   PLT 76* 68*  --  76* 74*  --  91* 94*  --    < > = values in this interval not displayed.     HbA1C: Hgb A1c MFr Bld  Date/Time Value Ref Range Status  01/26/2019 04:49 PM 5.5 4.8 - 5.6 % Final    Comment:    (NOTE) Pre diabetes:          5.7%-6.4% Diabetes:              >6.4% Glycemic control for   <7.0% adults with diabetes     Urinalysis: No results for input(s): "COLORURINE", "LABSPEC", "PHURINE", "GLUCOSEU", "HGBUR", "BILIRUBINUR", "KETONESUR", "PROTEINUR", "UROBILINOGEN", "NITRITE", "LEUKOCYTESUR" in the last 72 hours.  Invalid input(s): "APPERANCEUR"    Imaging: MR BRAIN WO CONTRAST  Result Date: 04/25/2023 CLINICAL DATA:  Initial  evaluation for headache. EXAM: MRI HEAD WITHOUT CONTRAST TECHNIQUE: Multiplanar, multiecho pulse sequences of the brain and surrounding structures were obtained without intravenous contrast. COMPARISON:  None Available. FINDINGS: Brain: Cerebral volume within normal limits. Few scattered subcentimeter foci of T2/FLAIR signal abnormality seen involving the periventricular deep white matter of both cerebral hemispheres, most pronounced about the frontal lobes bilaterally. Findings are nonspecific, but overall mild in nature. No evidence for acute or subacute ischemia. Gray-white matter differentiation maintained. No acute intracranial hemorrhage. Single chronic microhemorrhage noted at the inferior aspect of the right basal ganglia, of doubtful significance in isolation. No mass lesion, midline shift or mass effect. No hydrocephalus or extra-axial fluid collection. Partially empty sella noted. Suprasellar region within normal limits. Vascular: Major  intracranial vascular flow voids are maintained. Skull and upper cervical spine: Craniocervical junction within normal limits. Diffuse loss of normal bone marrow signal, nonspecific but can be seen with anemia, smoking, obesity, and infiltrative/myelofibrotic marrow processes. No scalp soft tissue abnormality. Sinuses/Orbits: Globes orbital soft tissues within normal limits. Scattered mucosal thickening noted about the ethmoidal air cells and maxillary sinuses. No significant mastoid effusion. Other: None. IMPRESSION: 1. No acute intracranial abnormality. 2. Few scattered subcentimeter foci of T2/FLAIR signal abnormality involving the supratentorial cerebral white matter, overall mild in nature. Findings are nonspecific, but most commonly seen in the setting of chronic microvascular ischemic disease. Sequelae of complicated migraines would be the primary differential consideration given the history of headaches. 3. Partially empty sella. While this is often a normal variant, this can also be seen in the setting of idiopathic intracranial hypertension. Electronically Signed   By: Rise Mu M.D.   On: 04/25/2023 04:53   [place] ASCITES (ABDOMEN LIMITED)  Result Date: 04/23/2023 INDICATION: Patient with history of end-stage renal disease and recurrent ascites. Request received for therapeutic paracentesis EXAM: ULTRASOUND ABDOMEN LIMITED FINDINGS: Imaging of all 4 quadrants of the abdomen reveal small amount of ascites left lower quadrant. IMPRESSION: Small amount of ascites seen on ultrasound. After discussion of risks versus benefits of procedure, patient decided to defer paracentesis until a later time. Exam conducted by Mina Marble, PA-C Electronically Signed   By: Olive Bass M.D.   On: 04/23/2023 16:44     Medications:    cefTRIAXone (ROCEPHIN)  IV 2 g (04/24/23 1418)    sodium chloride   Intravenous Once   bictegravir-emtricitabine-tenofovir AF  1 tablet Oral Daily   calcium acetate   2,001 mg Oral TID AC   Chlorhexidine Gluconate Cloth  6 each Topical Q0600   dapsone  50 mg Oral BID   furosemide  40 mg Intravenous Once   gabapentin  300 mg Oral QHS   methylPREDNISolone (SOLU-MEDROL) injection  40 mg Intravenous Daily   metoprolol succinate  12.5 mg Oral Daily   pantoprazole (PROTONIX) IV  40 mg Intravenous Q12H   torsemide  40 mg Oral Daily    Assessment/ Plan:     47 year old female with history of hypertension, coronary artery disease, congestive heart failure, HIV, cirrhosis of the liver, end-stage renal disease on dialysis now admitted with history of shortness of breath, cough, fever. She is found to have fluid overload and also possible pneumonia. She is on a Tuesday Thursday Saturday schedule for dialysis.   [facility]/TTS/right chest PermCath   #1: ESRD on hemodialysis: AVF infiltrated during treatment on Friday.  Edema has improved continue to apply ice as needed.  Will reattempt aVF outpatient.  Will continue to utilize right chest PermCath during  admission.  Next treatment scheduled for Tuesday.  #2: Sepsis/community-acquired pneumonia: Continue Rocephin per primary team.  #3: Hypotension: Continue torsemide and metoprolol as ordered.  Blood pressure acceptable, 106/68.  #4: Secondary hyperparathyroidism: Will continue to monitor bone minerals during this admission.  Continue calcium acetate.  #5: Anemia with chronic kidney disease:  Patient receives Mircera at outpatient clinic.  CT renal stone shows a moderate size left pleural effusion with mild ascites.  Left thoracentesis performed  on 04/22/23 with 800 mL blood-tinged fluid removed.  Hemoglobin slightly decreased, 7.3.  Will order EPO with dialysis treatments.    LOS: 8 [facility] 11/10/202412:02 PM

## 2023-04-25 NOTE — Progress Notes (Signed)
Progress Note   Patient: Rebecca Bridges NWG:[id number] DOB: May 19, 1976 DOA: 04/17/2023     8 DOS: the patient was seen and examined on 04/25/2023   Brief hospital course: HPI: Tycee Blamer is a 47 y.o. female with medical history significant of ESRD on HD TTS, dilated cardiomyopathy with chronic combined HFrEF and HFpEF with LVEF <15% on echo 2023, nonobstructive CAD, COPD, cirrhosis requiring paracentesis, PE on Eliquis, HIV (CD4=80, Oct/2024) on antiviral treatment and dapsone, HCV presented with worsening of productive cough shortness of breath and left-sided chest pain. Onset 2d prior, progressed to productive cough w/ blood tinged mucus, L chest pain. EMS called. Febrile, tachycardic, SpO2 87% RA.   Hospital course / significant events:  11/02: to ED, CXR (+)L PNA and pleural effusion, admitted to hospitalist service for sepsis  11/03: (+)BCx strep pneumo. Echo ordered, likely will end up needing TEE, will continue abx and involve ID, chest pain PM and no EKG changes and troponin elevated but downtrending 11/04: ID consult pending, echo done and read is pending, BP stable, no further chest pain 11/05: echo - EF stable 20-25% severe hypokinesis/strain. Thoracentesis removed 600 mL 11/6.  Patient feeling better today but did have a bloody nose this morning and had low blood pressure 11/7.  Patient stated she had some black stools overnight.  Hemoglobin this morning 6.2.  A unit of blood ordered.  Benefits and risk explained to patient.  Eliquis stopped.  IV Protonix ordered.  CT scan of the abdomen did not show any retroperitoneal bleed.  Thoracentesis drew off another 800 mL as of bloody tinged fluid. 11/8.  Patient still having back pain.  Interested in a paracentesis.  Ultrasound did not show enough fluid in the abdomen.  Hemoglobin 7.8. 11/9.  Hemoglobin stable at 7.8.  Patient still having back pain and left-sided abdominal pain.  CT scan the other day did not show anything acute in the abdomen.   Will start Solu-Medrol and as needed muscle relaxant. 11/10.  Back pain improved today with starting Solu-Medrol yesterday.  Patient's hemoglobin down to 7.3 and will give 1 unit of packed red blood cells today.  Consultants:  Infectious disease Advanced heart failure   Procedures/Surgeries: Thoracentesis on 11/5, 11/7.            Assessment and Plan: * Acute blood loss anemia Hemoglobin 7.3.  Will give 1 unit of packed red blood cells today.  Received 1 unit of packed red blood cells on 11/7.  Patient states that she has had no further bleeding.  Eliquis still on hold.  Continue on IV Protonix.  CT abdomen did not show any retroperitoneal bleed.  Check hemoglobin again tomorrow.  Patient did cough up a little bloody sputum.  Sepsis due to Streptococcus pneumoniae (HCC) Severe sepsis, present on admission with fever, tachycardia, left-sided pneumonia with pleural effusion, lactic acidosis.  Blood cultures positive for Streptococcus pneumoniae.  Repeat blood cultures are negative.  Patient had thoracentesis of 600 mL underneath the left lung on 11/5 and another 800 mL underneath the left lung on 11/7.  Case discussed with ID and will continue IV Rocephin through the weekend and reassess.  Patient may be able to go home on oral Levaquin.  Pleural effusion, left Parapneumonic effusion.  Thoracentesis removed 600 mL underneath the left lung on 11/5 and another 800 mL underneath the left lung on 11/7.  Acute on chronic systolic CHF (congestive heart failure) (HCC) Dialysis to manage fluid, last EF 20 to 25% with moderate mitral  regurgitation.  Patient on Toprol-XL and torsemide.  End-stage renal disease on hemodialysis [facility]) Patient had dialysis yesterday  Hyperkalemia On presentation.  Improved with dialysis.  Pressure injury of skin Present on admission, sacrum stage I.  See full description below.  AIDS (acquired immune deficiency syndrome) (HCC) CD4 count 47, patient on  Biktarvy and dapsone.  Epistaxis Advised not to blow her nose.  No further nosebleeds.  Hold Eliquis.  As needed Afrin nasal spray.  Abdominal pain Left-sided abdominal pain and back pain.  Improved with Solu-Medrol  Thrombocytopenia (HCC) Platelet count 94.  Chronic in nature  Headache MRI of the brain did not show any acute infarct or event.  Hyponatremia Dialysis to manage        Subjective: Patient feeling better today with regards to her back pain and abdominal pain.  Breathing little bit better.  Admitted with sepsis.  Physical Exam: Vitals:   04/25/23 0059 04/25/23 0400 04/25/23 0812 04/25/23 1148  BP: (!) 91/57 (!) 88/61 93/71 106/68  Pulse: 74 67 77 83  Resp: 18 17 18 20   Temp: 97.9 F (36.6 C) 97.7 F (36.5 C) 97.7 F (36.5 C) (!) 97.5 F (36.4 C)  TempSrc: Oral Oral    SpO2: 96% 94% 98% 100%  Weight:      Height:       Physical Exam HENT:     Head: Normocephalic.  Eyes:     General: Lids are normal.     Conjunctiva/sclera: Conjunctivae normal.  Cardiovascular:     Rate and Rhythm: Normal rate and regular rhythm.     Heart sounds: S1 normal and S2 normal. Murmur heard.     Systolic murmur is present with a grade of 2/6.  Pulmonary:     Breath sounds: Examination of the left-lower field reveals decreased breath sounds and rhonchi. Decreased breath sounds and rhonchi present. No wheezing or rales.  Abdominal:     Palpations: Abdomen is soft.     Tenderness: There is abdominal tenderness in the left lower quadrant.  Musculoskeletal:     Right lower leg: No swelling.     Left lower leg: No swelling.  Skin:    General: Skin is warm.     Findings: No rash.  Neurological:     Mental Status: She is alert and oriented to person, place, and time.     Data Reviewed: Hemoglobin 7.3 MRI of the brain showed no acute issue, chronic microvascular ischemia, partially empty sella. Disposition: Status is: Inpatient Remains inpatient appropriate because:  Continue IV antibiotics through the weekend and continue to monitor.  Planned Discharge Destination: Home    Time spent: 28 minutes  Author: Alford Highland, MD 04/25/2023 12:13 PM  For on call review [email or web address].

## 2023-04-26 ENCOUNTER — Other Ambulatory Visit: Payer: Self-pay

## 2023-04-26 ENCOUNTER — Inpatient Hospital Stay: Payer: 59

## 2023-04-26 DIAGNOSIS — B182 Chronic viral hepatitis C: Secondary | ICD-10-CM

## 2023-04-26 DIAGNOSIS — D62 Acute posthemorrhagic anemia: Secondary | ICD-10-CM | POA: Diagnosis not present

## 2023-04-26 DIAGNOSIS — J90 Pleural effusion, not elsewhere classified: Secondary | ICD-10-CM | POA: Diagnosis not present

## 2023-04-26 DIAGNOSIS — B20 Human immunodeficiency virus [HIV] disease: Secondary | ICD-10-CM | POA: Diagnosis not present

## 2023-04-26 DIAGNOSIS — R7881 Bacteremia: Secondary | ICD-10-CM | POA: Diagnosis not present

## 2023-04-26 DIAGNOSIS — R188 Other ascites: Secondary | ICD-10-CM | POA: Insufficient documentation

## 2023-04-26 DIAGNOSIS — J869 Pyothorax without fistula: Secondary | ICD-10-CM

## 2023-04-26 DIAGNOSIS — I5023 Acute on chronic systolic (congestive) heart failure: Secondary | ICD-10-CM | POA: Diagnosis not present

## 2023-04-26 DIAGNOSIS — A403 Sepsis due to Streptococcus pneumoniae: Secondary | ICD-10-CM | POA: Diagnosis not present

## 2023-04-26 LAB — CBC
HCT: 28.5 % — ABNORMAL LOW (ref 36.0–46.0)
Hemoglobin: 9 g/dL — ABNORMAL LOW (ref 12.0–15.0)
MCH: 31.1 pg (ref 26.0–34.0)
MCHC: 31.6 g/dL (ref 30.0–36.0)
MCV: 98.6 fL (ref 80.0–100.0)
Platelets: 157 10*3/uL (ref 150–400)
RBC: 2.89 MIL/uL — ABNORMAL LOW (ref 3.87–5.11)
RDW: 18.5 % — ABNORMAL HIGH (ref 11.5–15.5)
WBC: 7 10*3/uL (ref 4.0–10.5)
nRBC: 0 % (ref 0.0–0.2)

## 2023-04-26 LAB — BPAM RBC
Blood Product Expiration Date: 202411292359
Blood Product Expiration Date: 202412112359
ISSUE DATE / TIME: 202411071006
ISSUE DATE / TIME: 202411101439
Unit Type and Rh: 6200
Unit Type and Rh: 6200

## 2023-04-26 LAB — BASIC METABOLIC PANEL
Anion gap: 12 (ref 5–15)
BUN: 38 mg/dL — ABNORMAL HIGH (ref 6–20)
CO2: 25 mmol/L (ref 22–32)
Calcium: 8 mg/dL — ABNORMAL LOW (ref 8.9–10.3)
Chloride: 93 mmol/L — ABNORMAL LOW (ref 98–111)
Creatinine, Ser: 4.51 mg/dL — ABNORMAL HIGH (ref 0.44–1.00)
GFR, Estimated: 12 mL/min — ABNORMAL LOW (ref >60)
Glucose, Bld: 113 mg/dL — ABNORMAL HIGH (ref 70–99)
Potassium: 3.9 mmol/L (ref 3.5–5.1)
Sodium: 130 mmol/L — ABNORMAL LOW (ref 135–145)

## 2023-04-26 LAB — BODY FLUID CULTURE W GRAM STAIN: Culture: NO GROWTH

## 2023-04-26 LAB — TYPE AND SCREEN
ABO/RH(D): A POS
Antibody Screen: NEGATIVE
Unit division: 0
Unit division: 0

## 2023-04-26 LAB — COMP PANEL: LEUKEMIA/LYMPHOMA

## 2023-04-26 LAB — CYTOLOGY - NON PAP

## 2023-04-26 MED ORDER — METOPROLOL SUCCINATE ER 25 MG PO TB24: 12.5000 mg | ORAL_TABLET | Freq: Every day | ORAL | Status: AC

## 2023-04-26 MED ORDER — DAPSONE 25 MG PO TABS
50.0000 mg | ORAL_TABLET | Freq: Two times a day (BID) | ORAL | 0 refills | Status: AC
  Filled 2023-04-26 (×2): qty 30, 8d supply, fill #0

## 2023-04-26 MED ORDER — AMOXICILLIN-POT CLAVULANATE 500-125 MG PO TABS
1.0000 | ORAL_TABLET | Freq: Every day | ORAL | 0 refills | Status: AC
  Filled 2023-04-26: qty 30, 30d supply, fill #0

## 2023-04-26 MED ORDER — OXYCODONE-ACETAMINOPHEN 5-325 MG PO TABS: 1.0000 | ORAL_TABLET | Freq: Four times a day (QID) | ORAL | 0 refills | Status: AC | PRN

## 2023-04-26 MED ORDER — LIDOCAINE HCL (PF) 1 % IJ SOLN
10.0000 mL | Freq: Once | INTRAMUSCULAR | Status: AC
  Administered 2023-04-26: 10 mL via INTRADERMAL
  Filled 2023-04-26: qty 10

## 2023-04-26 MED ORDER — BICTEGRAVIR-EMTRICITAB-TENOFOV 50-200-25 MG PO TABS
1.0000 | ORAL_TABLET | Freq: Every evening | ORAL | 0 refills | Status: AC
  Filled 2023-04-26: qty 30, 30d supply, fill #0

## 2023-04-26 MED ORDER — DEXTROMETHORPHAN-GUAIFENESIN 20-200 MG/20ML PO LIQD
10.0000 mL | ORAL | 0 refills | Status: AC | PRN
  Filled 2023-04-26: qty 236, 4d supply, fill #0

## 2023-04-26 MED ORDER — CEFAZOLIN IV (FOR PTA / DISCHARGE USE ONLY): 2.0000 g | INTRAVENOUS | Status: AC

## 2023-04-26 MED ORDER — PREDNISONE 10 MG PO TABS
ORAL_TABLET | ORAL | 0 refills | Status: AC
  Filled 2023-04-26: qty 14, 8d supply, fill #0

## 2023-04-26 NOTE — Assessment & Plan Note (Addendum)
Paracentesis done today

## 2023-04-26 NOTE — Care Management Important Message (Signed)
Important Message  Patient Details  Name: Rebecca Bridges MRN: [id number] Date of Birth: 1975/09/30   Important Message Given:  Yes - Medicare IM     [name] 04/26/2023, 2:47 PM

## 2023-04-26 NOTE — Procedures (Signed)
Ultrasound-guided therapeutic paracentesis performed yielding 1.3 liters of dark amber colored fluid.  is. No immediate complications. EBL is none.

## 2023-04-26 NOTE — Progress Notes (Signed)
Progress Note    04/26/2023 10:55 AM * No surgery found *  Subjective:  This is a 47 y.o. female  with medical history significant of ESRD on HD TTS, dilated cardiomyopathy with chronic combined HFrEF and HFpEF with LVEF <15% on echo 2023, nonobstructive CAD, COPD, cirrhosis requiring paracentesis, PE on Eliquis, HIV (CD4=80, Oct/2024) on antiviral treatment and dapsone, HCV presented with worsening of productive cough shortness of breath and left-sided chest pain. Onset 2d prior, progressed to productive cough w/ blood tinged mucus, L chest pain. EMS called. Febrile, tachycardic, SpO2 87% RA.    On exam this morning patient is standing at the sink completing her mrning cleanup.  She does endorse pain in her left wrist when she flexes.  Patient's fistula was created on 02/12/2023 and is now 8 weeks healed and okay to use for dialysis.  There are no signs or symptoms of infection, cellulitis, aneurysm or lack of blood flow.  Patient has strong radial pulses.  Patient's left upper extremity is warm to touch.  Patient has good capillary refill.  Vitals all remained stable. Vascular ultrasound of her A/V fistula shows a widely patent fistula. No vascular insufficiency.    Vitals:   04/26/23 0731 04/26/23 1030  BP: 126/78   Pulse: 91   Resp: 18   Temp: (!) 97.5 F (36.4 C)   SpO2: 97% 98%   Physical Exam: Cardiac:  RRR, Normal S1, S2. Positive Murmur Left Base grade 2/6.  Lungs: Normal respiratory effort with decreased breath sounds and rhonchi scattered throughout. Incisions: None Extremities: Left upper extremity with quick capillary refill positive radial ulnar pulses.  Good thrill to AV fistula.  Upper extremities warm to touch. Abdomen: Positive bowel sounds throughout.  Soft, positive abdominal tenderness in the left lower quadrant with positive distention. Neurologic: Alert and oriented to person place and time this morning.  And answers all questions and follows commands  appropriately.  CBC    Component Value Date/Time   WBC 7.0 04/26/2023 0743   RBC 2.89 (L) 04/26/2023 0743   HGB 9.0 (L) 04/26/2023 0743   HGB 9.1 (L) 04/18/2023 1516   HCT 28.5 (L) 04/26/2023 0743   HCT 27.9 (L) 04/18/2023 1516   PLT 157 04/26/2023 0743   PLT 76 (L) 04/18/2023 1516   MCV 98.6 04/26/2023 0743   MCV 100 (H) 04/18/2023 1516   MCH 31.1 04/26/2023 0743   MCHC 31.6 04/26/2023 0743   RDW 18.5 (H) 04/26/2023 0743   RDW 15.7 (H) 04/18/2023 1516   LYMPHSABS 0.4 (L) 04/18/2023 1516   MONOABS 0.3 10/04/2022 2202   EOSABS 0.1 04/18/2023 1516   BASOSABS 0.0 04/18/2023 1516    BMET    Component Value Date/Time   NA 130 (L) 04/26/2023 0743   NA 132 (L) 03/06/2019 1418   K 3.9 04/26/2023 0743   CL 93 (L) 04/26/2023 0743   CO2 25 04/26/2023 0743   GLUCOSE 113 (H) 04/26/2023 0743   BUN 38 (H) 04/26/2023 0743   BUN 41 (H) 03/06/2019 1418   CREATININE 4.51 (H) 04/26/2023 0743   CALCIUM 8.0 (L) 04/26/2023 0743   GFRNONAA 12 (L) 04/26/2023 0743   GFRAA 16 (L) 06/13/2019 1150    INR    Component Value Date/Time   INR 1.5 (H) 10/04/2022 2202     Intake/Output Summary (Last 24 hours) at 04/26/2023 1055 Last data filed at 04/25/2023 1801 Gross per 24 hour  Intake 384 ml  Output --  Net 384 ml  Assessment/Plan:  47 y.o. female who presented to [facility] emergency department on 04/17/2023 with pleural effusion and sepsis.  At that time patient had complaint of left wrist pain at the site where her AV fistula has been placed.* No surgery found *   After workup AV fistula was found to be widely patent.  Has good thrill patient has warm left upper extremity with good capillary refill and positive ulnar and radial pulses.  No intervention is needed at this time. Vascular surgery will sign off.  If any difficulties please reconsult.  DVT prophylaxis: Heparin with dialysis.   Marcie Bal [facility] 04/26/2023 10:55 AM

## 2023-04-26 NOTE — Progress Notes (Signed)
Subjective: No new complaints   Antibiotics:  Anti-infectives (From admission, onward)    Start     Dose/Rate Route Frequency Ordered Stop   04/21/23 1400  cefTRIAXone (ROCEPHIN) 2 g in sodium chloride 0.9 % 100 mL IVPB        2 g 200 mL/hr over 30 Minutes Intravenous Every 24 hours 04/21/23 1157     04/20/23 1200  vancomycin (VANCOREADY) IVPB 500 mg/100 mL  Status:  Discontinued        500 mg 100 mL/hr over 60 Minutes Intravenous Every T-Th-Sa (Hemodialysis) 04/18/23 1501 04/19/23 1653   04/19/23 1600  azithromycin (ZITHROMAX) 500 mg in sodium chloride 0.9 % 250 mL IVPB  Status:  Discontinued        500 mg 255 mL/hr over 60 Minutes Intravenous Every 24 hours 04/19/23 0901 04/19/23 1653   04/18/23 1600  vancomycin (VANCOCIN) IVPB 1000 mg/200 mL premix        1,000 mg 200 mL/hr over 60 Minutes Intravenous  Once 04/18/23 1501 04/18/23 2147   04/18/23 1000  bictegravir-emtricitabine-tenofovir AF (BIKTARVY) 50-200-25 MG per tablet 1 tablet        1 tablet Oral Daily 04/17/23 1526     04/18/23 1000  dapsone tablet 50 mg        50 mg Oral 2 times daily 04/17/23 1526     04/17/23 1500  cefTRIAXone (ROCEPHIN) 2 g in sodium chloride 0.9 % 100 mL IVPB  Status:  Discontinued        2 g 200 mL/hr over 30 Minutes Intravenous Every 24 hours 04/17/23 1455 04/21/23 1157   04/17/23 1500  azithromycin (ZITHROMAX) 500 mg in dextrose 5 % 250 mL IVPB  Status:  Discontinued        500 mg 250 mL/hr over 60 Minutes Intravenous Every 24 hours 04/17/23 1455 04/19/23 0901       Medications: Scheduled Meds:  bictegravir-emtricitabine-tenofovir AF  1 tablet Oral Daily   calcium acetate  2,001 mg Oral TID AC   Chlorhexidine Gluconate Cloth  6 each Topical Q0600   dapsone  50 mg Oral BID   [START ON 04/27/2023] epoetin (EPOGEN/PROCRIT) injection  10,000 Units Intravenous Q T,Th,Sa-HD   gabapentin  300 mg Oral QHS   methylPREDNISolone (SOLU-MEDROL) injection  40 mg Intravenous Daily    metoprolol succinate  12.5 mg Oral Daily   pantoprazole (PROTONIX) IV  40 mg Intravenous Q12H   torsemide  40 mg Oral Daily   Continuous Infusions:  cefTRIAXone (ROCEPHIN)  IV 2 g (04/25/23 1319)   PRN Meds:.carisoprodol, guaiFENesin-dextromethorphan, HYDROmorphone (DILAUDID) injection, levalbuterol, metoCLOPramide (REGLAN) injection, Muscle Rub, oxyCODONE-acetaminophen, sodium chloride, traZODone    Objective: Weight change:   Intake/Output Summary (Last 24 hours) at 04/26/2023 1151 Last data filed at 04/25/2023 1801 Gross per 24 hour  Intake 384 ml  Output --  Net 384 ml   Blood pressure 123/83, pulse 87, temperature 97.7 F (36.5 C), resp. rate 18, height 5\' 3"  (1.6 m), weight 52.6 kg, last menstrual period 05/09/2019, SpO2 97%. Temp:  [97 F (36.1 C)-98.1 F (36.7 C)] 97.7 F (36.5 C) (11/11 1121) Pulse Rate:  [74-91] 87 (11/11 1121) Resp:  [15-20] 18 (11/11 1121) BP: (97-126)/(59-83) 123/83 (11/11 1121) SpO2:  [92 %-100 %] 97 % (11/11 1121)  Physical Exam: Physical Exam Constitutional:      General: She is not in acute distress.    Appearance: She is well-developed. She is not diaphoretic.  HENT:  Head: Normocephalic and atraumatic.     Right Ear: External ear normal.     Left Ear: External ear normal.     Mouth/Throat:     Pharynx: No oropharyngeal exudate.  Eyes:     General: No scleral icterus.    Conjunctiva/sclera: Conjunctivae normal.     Pupils: Pupils are equal, round, and reactive to light.  Cardiovascular:     Rate and Rhythm: Normal rate and regular rhythm.  Pulmonary:     Effort: Pulmonary effort is normal. No respiratory distress.     Breath sounds: No wheezing.  Abdominal:     General: Bowel sounds are normal. There is no distension.     Palpations: Abdomen is soft.     Tenderness: There is no abdominal tenderness.  Musculoskeletal:        General: No tenderness. Normal range of motion.  Lymphadenopathy:     Cervical: No cervical  adenopathy.  Skin:    General: Skin is warm and dry.     Coloration: Skin is not pale.     Findings: No erythema or rash.  Neurological:     General: No focal deficit present.     Mental Status: She is alert and oriented to person, place, and time.     Motor: No abnormal muscle tone.     Coordination: Coordination normal.  Psychiatric:        Mood and Affect: Mood normal.        Behavior: Behavior normal.        Thought Content: Thought content normal.        Judgment: Judgment normal.      CBC:    BMET Recent Labs    04/26/23 0743  NA 130*  K 3.9  CL 93*  CO2 25  GLUCOSE 113*  BUN 38*  CREATININE 4.51*  CALCIUM 8.0*     Liver Panel  No results for input(s): "PROT", "ALBUMIN", "AST", "ALT", "ALKPHOS", "BILITOT", "BILIDIR", "IBILI" in the last 72 hours.     Sedimentation Rate No results for input(s): "ESRSEDRATE" in the last 72 hours. C-Reactive Protein No results for input(s): "CRP" in the last 72 hours.  Micro Results: Recent Results (from the past 720 hour(s))  Blood culture (routine x 2)     Status: Abnormal   Collection Time: 04/17/23  2:20 PM   Specimen: BLOOD  Result Value Ref Range Status   Specimen Description   Final    BLOOD BLOOD RIGHT ARM Performed at [facility], [address], [place], [place] [address]    Special Requests   Final    BOTTLES DRAWN AEROBIC AND ANAEROBIC Blood Culture results may not be optimal due to an excessive volume of blood received in culture bottles Performed at [facility], [address] [place], [place] [address]    Culture  Setup Time   Final    GRAM POSITIVE COCCI IN BOTH AEROBIC AND ANAEROBIC BOTTLES STREPTOCOCCUS PNEUMONIAE CRITICAL RESULT CALLED TO, READ BACK BY AND VERIFIED WITH: Regino Bellow RN @ 0400 04/18/23 [facility]    Culture STREPTOCOCCUS PNEUMONIAE (A)  Final   Report Status 04/20/2023 FINAL  Final   Organism ID, Bacteria STREPTOCOCCUS PNEUMONIAE  Final      Susceptibility    Streptococcus pneumoniae - MIC*    ERYTHROMYCIN <=0.12 SENSITIVE Sensitive     LEVOFLOXACIN 0.5 SENSITIVE Sensitive     VANCOMYCIN 0.5 SENSITIVE Sensitive     PENICILLIN (meningitis) <=0.06 SENSITIVE Sensitive     PENO -  penicillin <=0.06      PENICILLIN (non-meningitis) <=0.06 SENSITIVE Sensitive     PENICILLIN (oral) <=0.06 SENSITIVE Sensitive     CEFTRIAXONE (non-meningitis) <=0.12 SENSITIVE Sensitive     CEFTRIAXONE (meningitis) <=0.12 SENSITIVE Sensitive     * STREPTOCOCCUS PNEUMONIAE  Blood Culture ID Panel (Reflexed)     Status: Abnormal   Collection Time: 04/17/23  2:20 PM  Result Value Ref Range Status   Enterococcus faecalis NOT DETECTED NOT DETECTED Final   Enterococcus Faecium NOT DETECTED NOT DETECTED Final   Listeria monocytogenes NOT DETECTED NOT DETECTED Final   Staphylococcus species NOT DETECTED NOT DETECTED Final   Staphylococcus aureus (BCID) NOT DETECTED NOT DETECTED Final   Staphylococcus epidermidis NOT DETECTED NOT DETECTED Final   Staphylococcus lugdunensis NOT DETECTED NOT DETECTED Final   Streptococcus species DETECTED (A) NOT DETECTED Final    Comment: CRITICAL RESULT CALLED TO, READ BACK BY AND VERIFIED WITH: NATHAN BELEU @ 0400 04/18/23 [facility]    Streptococcus agalactiae NOT DETECTED NOT DETECTED Final   Streptococcus pneumoniae DETECTED (A) NOT DETECTED Final    Comment: CRITICAL RESULT CALLED TO, READ BACK BY AND VERIFIED WITH: NATHAN BELEU @ 0400 04/18/23 [facility]    Streptococcus pyogenes NOT DETECTED NOT DETECTED Final   A.calcoaceticus-baumannii NOT DETECTED NOT DETECTED Final   Bacteroides fragilis NOT DETECTED NOT DETECTED Final   Enterobacterales NOT DETECTED NOT DETECTED Final   Enterobacter cloacae complex NOT DETECTED NOT DETECTED Final   Escherichia coli NOT DETECTED NOT DETECTED Final   Klebsiella aerogenes NOT DETECTED NOT DETECTED Final   Klebsiella oxytoca NOT DETECTED NOT DETECTED Final   Klebsiella pneumoniae NOT DETECTED NOT DETECTED  Final   Proteus species NOT DETECTED NOT DETECTED Final   Salmonella species NOT DETECTED NOT DETECTED Final   Serratia marcescens NOT DETECTED NOT DETECTED Final   Haemophilus influenzae NOT DETECTED NOT DETECTED Final   Neisseria meningitidis NOT DETECTED NOT DETECTED Final   Pseudomonas aeruginosa NOT DETECTED NOT DETECTED Final   Stenotrophomonas maltophilia NOT DETECTED NOT DETECTED Final   Candida albicans NOT DETECTED NOT DETECTED Final   Candida auris NOT DETECTED NOT DETECTED Final   Candida glabrata NOT DETECTED NOT DETECTED Final   Candida krusei NOT DETECTED NOT DETECTED Final   Candida parapsilosis NOT DETECTED NOT DETECTED Final   Candida tropicalis NOT DETECTED NOT DETECTED Final   Cryptococcus neoformans/gattii NOT DETECTED NOT DETECTED Final    Comment: Performed at [facility], [address] [place], [place] [address]  Blood culture (routine x 2)     Status: Abnormal   Collection Time: 04/17/23  2:23 PM   Specimen: BLOOD  Result Value Ref Range Status   Specimen Description   Final    BLOOD BLOOD RIGHT HAND Performed at [facility], [address] [place], [place] [address]    Special Requests   Final    BOTTLES DRAWN AEROBIC AND ANAEROBIC Blood Culture results may not be optimal due to an excessive volume of blood received in culture bottles Performed at [facility], [address] [place], [place] [address]    Culture  Setup Time   Final    IN BOTH AEROBIC AND ANAEROBIC BOTTLES GRAM POSITIVE COCCI STREPTOCOCCUS PNEUMONIAE CRITICAL RESULT CALLED TO, READ BACK BY AND VERIFIED WITH: NATHAN BELEU @ 0400 04/18/23 [facility]    Culture (A)  Final    STREPTOCOCCUS PNEUMONIAE SUSCEPTIBILITIES PERFORMED ON PREVIOUS CULTURE WITHIN THE LAST 5 DAYS. Performed at [facility], [address], [place], [place] [address]    Report Status 04/20/2023 FINAL  Final  MRSA Next Gen by PCR, Nasal     Status: None   Collection Time: 04/18/23   9:39 AM   Specimen: Nasal Mucosa; Nasal Swab  Result Value Ref Range Status   MRSA by PCR Next Gen NOT DETECTED NOT DETECTED Final    Comment: (NOTE) The GeneXpert MRSA Assay (FDA approved for NASAL specimens only), is one component of a comprehensive MRSA colonization surveillance program. It is not intended to diagnose MRSA infection nor to guide or monitor treatment for MRSA infections. Test performance is not FDA approved in patients less than 60 years old. Performed at [facility], [address] [place], [place] [address]   Culture, blood (Routine X 2) w Reflex to ID Panel     Status: None   Collection Time: 04/20/23  4:43 AM   Specimen: BLOOD  Result Value Ref Range Status   Specimen Description BLOOD BLOOD RIGHT HAND  Final   Special Requests   Final    BOTTLES DRAWN AEROBIC AND ANAEROBIC Blood Culture results may not be optimal due to an excessive volume of blood received in culture bottles   Culture   Final    NO GROWTH 5 DAYS Performed at [facility], [address] [place], [place] [address]    Report Status 04/25/2023 FINAL  Final  Culture, blood (Routine X 2) w Reflex to ID Panel     Status: None   Collection Time: 04/20/23  4:43 AM   Specimen: BLOOD  Result Value Ref Range Status   Specimen Description BLOOD  R FOA  Final   Special Requests   Final    BOTTLES DRAWN AEROBIC AND ANAEROBIC Blood Culture results may not be optimal due to an excessive volume of blood received in culture bottles   Culture   Final    NO GROWTH 5 DAYS Performed at [facility], [address] [place], [place] [address]    Report Status 04/25/2023 FINAL  Final  Body fluid culture w Gram Stain     Status: None   Collection Time: 04/20/23 12:09 PM   Specimen: PATH Cytology Pleural fluid  Result Value Ref Range Status   Specimen Description   Final    FLUID Performed at [facility], [address] [place], [place] [address]    Special  Requests   Final     CYTO PLEU Performed at [facility], [address] [place], [place] [address]    Gram Stain   Final    ABUNDANT WBC PRESENT,BOTH PMN AND MONONUCLEAR NO ORGANISMS SEEN    Culture   Final    NO GROWTH 3 DAYS Performed at [facility], [address], [place], [place] [address]    Report Status 04/23/2023 FINAL  Final  Body fluid culture w Gram Stain     Status: None   Collection Time: 04/22/23  4:35 PM   Specimen: PATH Cytology Pleural fluid  Result Value Ref Range Status   Specimen Description FLUID PLEURAL  Final   Special Requests NONE  Final   Gram Stain   Final    WBC PRESENT,BOTH PMN AND MONONUCLEAR NO ORGANISMS SEEN CYTOSPIN SMEAR    Culture   Final    NO GROWTH 3 DAYS Performed at [facility], [address], [place], [place] [address]    Report Status 04/26/2023 FINAL  Final  Expectorated Sputum  Assessment w Gram Stain, Rflx to Resp Cult     Status: None   Collection Time: 04/22/23 10:20 PM   Specimen: Sputum  Result Value Ref Range Status   Specimen Description SPUTUM  EXPSU  Final   Special Requests NONE  Final   Sputum evaluation   Final    THIS SPECIMEN IS ACCEPTABLE FOR SPUTUM CULTURE Performed at [facility], [address] [place], [place] [address]    Report Status 04/22/2023 FINAL  Final  Culture, Respiratory w Gram Stain     Status: None   Collection Time: 04/22/23 10:20 PM   Specimen: SPU  Result Value Ref Range Status   Specimen Description   Final    SPUTUM  EXPSU Performed at [facility], [address], [place], [place] [address]    Special Requests   Final    NONE Reflexed from [id number] Performed at [facility], [address] [place], [place] [address]    Gram Stain   Final    FEW SQUAMOUS EPITHELIAL CELLS PRESENT FEW WBC PRESENT,BOTH PMN AND MONONUCLEAR FEW GRAM POSITIVE COCCI IN PAIRS FEW GRAM NEGATIVE RODS    Culture   Final    Normal respiratory  flora-no Staph aureus or Pseudomonas seen Performed at [facility], [address] [place], [place] [address]    Report Status 04/25/2023 FINAL  Final  Expectorated Sputum Assessment w Gram Stain, Rflx to Resp Cult     Status: None   Collection Time: 04/24/23 12:15 PM   Specimen: Sputum  Result Value Ref Range Status   Specimen Description SPUTUM  Final   Special Requests Normal  Final   Sputum evaluation   Final    THIS SPECIMEN IS ACCEPTABLE FOR SPUTUM CULTURE Performed at [facility], [address] [place], [place] [address]    Report Status 04/24/2023 FINAL  Final  Culture, Respiratory w Gram Stain     Status: None (Preliminary result)   Collection Time: 04/24/23 12:15 PM   Specimen: SPU  Result Value Ref Range Status   Specimen Description   Final    SPUTUM Performed at [facility], [address], [place], [place] [address]    Special Requests   Final    Normal Reflexed from [phone] Performed at [facility], [address] [place], [place] [address]    Gram Stain   Final    FEW SQUAMOUS EPITHELIAL CELLS PRESENT FEW WBC PRESENT,BOTH PMN AND MONONUCLEAR FEW GRAM POSITIVE COCCI IN PAIRS FEW GRAM VARIABLE ROD    Culture   Final    CULTURE REINCUBATED FOR BETTER GROWTH Performed at [facility], [address], [place], [place] [address]    Report Status PENDING  Incomplete    Studies/Results: MR BRAIN WO CONTRAST  Result Date: 04/25/2023 CLINICAL DATA:  Initial evaluation for headache. EXAM: MRI HEAD WITHOUT CONTRAST TECHNIQUE: Multiplanar, multiecho pulse sequences of the brain and surrounding structures were obtained without intravenous contrast. COMPARISON:  None Available. FINDINGS: Brain: Cerebral volume within normal limits. Few scattered subcentimeter foci of T2/FLAIR signal abnormality seen involving the periventricular deep white matter of both cerebral hemispheres, most pronounced about the frontal lobes  bilaterally. Findings are nonspecific, but overall mild in nature. No evidence for acute or subacute ischemia. Gray-white matter differentiation maintained. No acute intracranial hemorrhage. Single chronic microhemorrhage noted at the inferior aspect of the right basal ganglia, of doubtful significance in isolation. No mass lesion, midline shift or mass  effect. No hydrocephalus or extra-axial fluid collection. Partially empty sella noted. Suprasellar region within normal limits. Vascular: Major intracranial vascular flow voids are maintained. Skull and upper cervical spine: Craniocervical junction within normal limits. Diffuse loss of normal bone marrow signal, nonspecific but can be seen with anemia, smoking, obesity, and infiltrative/myelofibrotic marrow processes. No scalp soft tissue abnormality. Sinuses/Orbits: Globes orbital soft tissues within normal limits. Scattered mucosal thickening noted about the ethmoidal air cells and maxillary sinuses. No significant mastoid effusion. Other: None. IMPRESSION: 1. No acute intracranial abnormality. 2. Few scattered subcentimeter foci of T2/FLAIR signal abnormality involving the supratentorial cerebral white matter, overall mild in nature. Findings are nonspecific, but most commonly seen in the setting of chronic microvascular ischemic disease. Sequelae of complicated migraines would be the primary differential consideration given the history of headaches. 3. Partially empty sella. While this is often a normal variant, this can also be seen in the setting of idiopathic intracranial hypertension. Electronically Signed   By: Rise Mu M.D.   On: 04/25/2023 04:53      Assessment/Plan:  INTERVAL HISTORY: patient feeling better   Principal Problem:   Acute blood loss anemia Active Problems:   HIV (human immunodeficiency virus infection) (HCC)   Hyponatremia   Headache   Thrombocytopenia (HCC)   Abdominal pain   Pleural effusion, left   Acute on  chronic systolic CHF (congestive heart failure) (HCC)   End-stage renal disease on hemodialysis (HCC)   Hyperkalemia   Sepsis due to Streptococcus pneumoniae (HCC)   Pressure injury of skin   Epistaxis   Bacteremia due to Streptococcus pneumoniae   AIDS (acquired immune deficiency syndrome) (HCC)   Lobar pneumonia, unspecified organism (HCC)    Rebecca Bridges is a 47 y.o. female with pneumococcal bacteremia due to pneumonia, also with exudative effusion, possible empyema sp thoracentesis, who has ESRD on HD, HIV/AIDS and hepatitis C  #1 Pneumococcal bacteremia:  Would have her complete Cefazolin 2 grams with HD to complete 2 weeks minimum therapy with end date for IV anbitiotics at 05/02/23  Cefazolin should be active vs this organism that was PCN S and would obviate need  for another line  I would then plan on her doing Augmenting 500/125 mg in late afternoon after HD on HD days until she is seen and evaluated by [facility]  Re her HD line it would not be the source of her infection but could have been seeded. My preference would be for it to come out but I am ok with this idea being revisited in outpatient world when AVF is working reliably  After she gets done with antibacterial antibiotics would be prudent to get surveillance cultures 2 weeks on the last dose of her antibiotics  There phone number for an appt is [phone]    #2 HIV/AIDS:  --continue Biktarvy and dapsone VL reasonably controlled but CD4 still low  #3 Chronic hepatitis C without hepatic coma: has not been consistently taking her Epclusa  Again she should followup with [facility] for NOW--her Insurance is not going to cover care at [facility] in February transfer her care to another center near her home. I have suggested  and she agrees to re-establish  that is close to her home and she would like to do so at [facility] where she has been seen in the past.  I have personally spent 50 minutes involved in face-to-face and  non-face-to-face activities for this patient on the day of the visit. Professional time spent includes the following  activities: Preparing to see the patient (review of tests), Obtaining and/or reviewing separately obtained history (admission/discharge record), Performing a medically appropriate examination and/or evaluation , Ordering medications/tests/procedures, referring and communicating with other health care professionals, Documenting clinical information in the EMR, Independently interpreting results (not separately reported), Communicating results to the patient/family/caregiver, Counseling and educating the patient/family/caregiver and Care coordination (not separately reported).   I will sign off for now.  Please call with further questions.   LOS: 9 days   Acey Lav 04/26/2023, 11:51 AM

## 2023-04-26 NOTE — Plan of Care (Signed)

## 2023-04-26 NOTE — Progress Notes (Addendum)
PHARMACY CONSULT NOTE FOR:  OUTPATIENT  PARENTERAL ANTIBIOTIC THERAPY (OPAT)  Indication: S. Pneumoniae bacteremia Regimen: Cefazolin 2gm IV qHD on Tue, Thur, and Sat End date: 05/01/2023  To be provided and administered by Hemodialysis center.  Discussed plan with Dimas Chyle and Wendee Beavers with renal team.    To start amoxicillin/clavulanate 500mg  orally at bedtime on 11/8 x 30 days  IV antibiotic discharge orders are pended. To discharging provider:  please sign these orders via discharge navigator,  Select New Orders & click on the button choice - Manage This Unsigned Work.     Thank you for allowing pharmacy to be a part of this patient's care.  Juliette Alcide, PharmD, BCPS, BCIDP Work Cell: [phone] 04/26/2023 12:58 PM

## 2023-04-26 NOTE — Progress Notes (Signed)
[facility]  PROGRESS NOTE   Subjective:   Patient seen sitting at bedside Eating breakfast  Only reports scant blood in sputum.  Denies pain  Feels ready for discharge.   Objective:  Vital signs: Blood pressure 123/83, pulse 87, temperature 97.7 F (36.5 C), resp. rate 18, height 5\' 3"  (1.6 m), weight 52.6 kg, last menstrual period 05/09/2019, SpO2 93%.  Intake/Output Summary (Last 24 hours) at 04/26/2023 1426 Last data filed at 04/26/2023 0700 Gross per 24 hour  Intake 584 ml  Output --  Net 584 ml   Filed Weights   04/17/23 2128 04/23/23 0750 04/23/23 1252  Weight: 41.6 kg 52.6 kg 52.6 kg     Physical Exam: General:  No acute distress  Head:  Normocephalic, atraumatic. Moist oral mucosal membranes  Eyes:  Anicteric  Lungs:  Diminished, normal effort  Heart:  S1S2 no rubs  Abdomen:   Soft, nontender, bowel sounds present  Extremities:  No peripheral edema.  Neurologic:  Awake, alert, following commands  Skin:  No lesions  Access: Rt permcath, Lt AVF     Basic Metabolic Panel: Recent Labs  Lab 04/20/23 0443 04/21/23 0459 04/22/23 0406 04/23/23 0835 04/26/23 0743  NA 126* 129* 129* 127* 130*  K 4.1 4.1 3.9 3.6 3.9  CL 90* 92* 94* 92* 93*  CO2 25 27 28 26 25   GLUCOSE 94 88 82 108* 113*  BUN 52* 80* 45* 42* 38*  CREATININE 6.48* 7.72* 4.35* 4.33* 4.51*  CALCIUM 7.7* 8.0* 8.1* 8.0* 8.0*  PHOS  --   --   --  3.0  --    GFR: Estimated Creatinine Clearance: 12.9 mL/min (A) (by C-G formula based on SCr of 4.51 mg/dL (H)).  Liver Function Tests: No results for input(s): "AST", "ALT", "ALKPHOS", "BILITOT", "PROT", "ALBUMIN" in the last 168 hours.  No results for input(s): "LIPASE", "AMYLASE" in the last 168 hours. No results for input(s): "AMMONIA" in the last 168 hours.  CBC: Recent Labs  Lab 04/20/23 0443 04/22/23 0406 04/22/23 1430 04/23/23 0835 04/24/23 0433 04/25/23 0520 04/26/23 0743  WBC 5.0 3.6*  --  4.2 5.5  --  7.0  HGB  8.6* 6.2* 8.6* 7.8* 7.8* 7.3* 9.0*  HCT 26.5* 19.9*  --  24.3* 24.8*  --  28.5*  MCV 103.1* 105.9*  --  102.5* 104.2*  --  98.6  PLT 76* 74*  --  91* 94*  --  157     HbA1C: Hgb A1c MFr Bld  Date/Time Value Ref Range Status  01/26/2019 04:49 PM 5.5 4.8 - 5.6 % Final    Comment:    (NOTE) Pre diabetes:          5.7%-6.4% Diabetes:              >6.4% Glycemic control for   <7.0% adults with diabetes     Urinalysis: No results for input(s): "COLORURINE", "LABSPEC", "PHURINE", "GLUCOSEU", "HGBUR", "BILIRUBINUR", "KETONESUR", "PROTEINUR", "UROBILINOGEN", "NITRITE", "LEUKOCYTESUR" in the last 72 hours.  Invalid input(s): "APPERANCEUR"    Imaging: MR BRAIN WO CONTRAST  Result Date: 04/25/2023 CLINICAL DATA:  Initial evaluation for headache. EXAM: MRI HEAD WITHOUT CONTRAST TECHNIQUE: Multiplanar, multiecho pulse sequences of the brain and surrounding structures were obtained without intravenous contrast. COMPARISON:  None Available. FINDINGS: Brain: Cerebral volume within normal limits. Few scattered subcentimeter foci of T2/FLAIR signal abnormality seen involving the periventricular deep white matter of both cerebral hemispheres, most pronounced about the frontal lobes bilaterally. Findings are nonspecific, but overall mild  in nature. No evidence for acute or subacute ischemia. Gray-white matter differentiation maintained. No acute intracranial hemorrhage. Single chronic microhemorrhage noted at the inferior aspect of the right basal ganglia, of doubtful significance in isolation. No mass lesion, midline shift or mass effect. No hydrocephalus or extra-axial fluid collection. Partially empty sella noted. Suprasellar region within normal limits. Vascular: Major intracranial vascular flow voids are maintained. Skull and upper cervical spine: Craniocervical junction within normal limits. Diffuse loss of normal bone marrow signal, nonspecific but can be seen with anemia, smoking, obesity, and  infiltrative/myelofibrotic marrow processes. No scalp soft tissue abnormality. Sinuses/Orbits: Globes orbital soft tissues within normal limits. Scattered mucosal thickening noted about the ethmoidal air cells and maxillary sinuses. No significant mastoid effusion. Other: None. IMPRESSION: 1. No acute intracranial abnormality. 2. Few scattered subcentimeter foci of T2/FLAIR signal abnormality involving the supratentorial cerebral white matter, overall mild in nature. Findings are nonspecific, but most commonly seen in the setting of chronic microvascular ischemic disease. Sequelae of complicated migraines would be the primary differential consideration given the history of headaches. 3. Partially empty sella. While this is often a normal variant, this can also be seen in the setting of idiopathic intracranial hypertension. Electronically Signed   By: Rise Mu M.D.   On: 04/25/2023 04:53     Medications:    cefTRIAXone (ROCEPHIN)  IV 2 g (04/26/23 1347)    bictegravir-emtricitabine-tenofovir AF  1 tablet Oral Daily   calcium acetate  2,001 mg Oral TID AC   Chlorhexidine Gluconate Cloth  6 each Topical Q0600   dapsone  50 mg Oral BID   [START ON 04/27/2023] epoetin (EPOGEN/PROCRIT) injection  10,000 Units Intravenous Q T,Th,Sa-HD   gabapentin  300 mg Oral QHS   methylPREDNISolone (SOLU-MEDROL) injection  40 mg Intravenous Daily   metoprolol succinate  12.5 mg Oral Daily   pantoprazole (PROTONIX) IV  40 mg Intravenous Q12H   torsemide  40 mg Oral Daily    Assessment/ Plan:     47 year old female with history of hypertension, coronary artery disease, congestive heart failure, HIV, cirrhosis of the liver, end-stage renal disease on dialysis now admitted with history of shortness of breath, cough, fever. She is found to have fluid overload and also possible pneumonia. She is on a Tuesday Thursday Saturday schedule for dialysis.   [facility]/TTS/right chest PermCath   #1: ESRD  on hemodialysis: AVF infiltrated during treatment on Friday.  Next treatment scheduled for Tuesday.  #2: Sepsis/community-acquired pneumonia: Per ID, continue Cefazolin 2g with each dialysis treatment until 05/01/23. Outpatient clinic notified and agreeable.   #3: Hypotension: Continue torsemide and metoprolol as ordered.  Blood pressure stable  #4: Secondary hyperparathyroidism:Calcium at goal, 8.0.  Continue calcium acetate.  #5: Anemia with chronic kidney disease:  Patient receives Mircera at outpatient clinic.  CT renal stone shows a moderate size left pleural effusion with mild ascites.  Left thoracentesis performed  on 04/22/23 with 800 mL blood-tinged fluid removed.  Patient received blood transfusion yesterday, Hgb 9.0 today.  Will order EPO with dialysis treatments.    LOS: 9 [facility] 11/11/20242:26 PM

## 2023-04-26 NOTE — Discharge Summary (Signed)
Physician Discharge Summary   Patient: Rebecca Bridges MRN: [id number] DOB: 10-05-1975  Admit date:     04/17/2023  Discharge date: 04/26/23  Discharge Physician: Alford Highland   PCP: [facility]   Recommendations at discharge:   Follow-up with your primary care physician in 5 days Follow-up with dialysis as scheduled Follow-up with your infectious disease specialist  Discharge Diagnoses: Principal Problem:   Acute blood loss anemia Active Problems:   Sepsis due to Streptococcus pneumoniae (HCC)   Pleural effusion, left   Acute on chronic systolic CHF (congestive heart failure) (HCC)   End-stage renal disease on hemodialysis (HCC)   HIV (human immunodeficiency virus infection) (HCC)   Hyperkalemia   Pressure injury of skin   Hyponatremia   Headache   Thrombocytopenia (HCC)   Abdominal pain   Epistaxis   Bacteremia due to Streptococcus pneumoniae   AIDS (acquired immune deficiency syndrome) (HCC)   Lobar pneumonia, unspecified organism [facility])   Hospital Course: HPI: Rebecca Bridges is a 47 y.o. female with medical history significant of ESRD on HD TTS, dilated cardiomyopathy with chronic combined HFrEF and HFpEF with LVEF <15% on echo 2023, nonobstructive CAD, COPD, cirrhosis requiring paracentesis, PE on Eliquis, HIV (CD4=80, Oct/2024) on antiviral treatment and dapsone, HCV presented with worsening of productive cough shortness of breath and left-sided chest pain. Onset 2d prior, progressed to productive cough w/ blood tinged mucus, L chest pain. EMS called. Febrile, tachycardic, SpO2 87% RA.   Hospital course / significant events:  11/02: to ED, CXR (+)L PNA and pleural effusion, admitted to hospitalist service for sepsis  11/03: (+)BCx strep pneumo. Echo ordered, likely will end up needing TEE, will continue abx and involve ID, chest pain PM and no EKG changes and troponin elevated but downtrending 11/04: ID consult pending, echo done and read is pending, BP stable, no  further chest pain 11/05: echo - EF stable 20-25% severe hypokinesis/strain. Thoracentesis removed 600 mL 11/6.  Patient feeling better today but did have a bloody nose this morning and had low blood pressure 11/7.  Patient stated she had some black stools overnight.  Hemoglobin this morning 6.2.  A unit of blood ordered.  Benefits and risk explained to patient.  Eliquis stopped.  IV Protonix ordered.  CT scan of the abdomen did not show any retroperitoneal bleed.  Thoracentesis drew off another 800 mL as of bloody tinged fluid. 11/8.  Patient still having back pain.  Interested in a paracentesis.  Ultrasound did not show enough fluid in the abdomen.  Hemoglobin 7.8. 11/9.  Hemoglobin stable at 7.8.  Patient still having back pain and left-sided abdominal pain.  CT scan the other day did not show anything acute in the abdomen.  Will start Solu-Medrol and as needed muscle relaxant. 11/10.  Back pain improved today with starting Solu-Medrol yesterday.  Patient's hemoglobin down to 7.3 and will give 1 unit of packed red blood cells today. 11/11.  Patient interested in going home today but wanted to see if she can get a paracentesis first.  Hemoglobin up to 9.0 today.  Infectious disease specialist recommended IV Ancef with dialysis through 11/16 then Augmentin orally for 1 month after that.  Consultants:  Infectious disease Advanced heart failure   Procedures/Surgeries: Thoracentesis on 11/5, 11/7. Paracentesis on 11/11.            Assessment and Plan: * Acute blood loss anemia Hemoglobin upon discharge 9.0.  Patient received 2 units of packed red blood cells during the  hospital course.  Patient states that she has had no further bleeding.  Patient wants to hold Eliquis upon discharge for now.  CT abdomen did not show any retroperitoneal bleed.  Patient did cough up a little bloody sputum.  Continue Protonix upon discharge.  Sepsis due to Streptococcus pneumoniae (HCC) Severe sepsis,  present on admission with fever, tachycardia, left-sided pneumonia with pleural effusion, lactic acidosis.  Blood cultures positive for Streptococcus pneumoniae.  Repeat blood cultures are negative.  Patient had thoracentesis of 600 mL underneath the left lung on 11/5 and another 800 mL underneath the left lung on 11/7.  Case discussed with ID and give Rocephin today and do IV cefazolin with dialysis through 1116.  Then will have Augmentin after dialysis for 1 month.  Pleural effusion, left Parapneumonic effusion.  Thoracentesis removed 600 mL underneath the left lung on 11/5 and another 800 mL underneath the left lung on 11/7.  Acute on chronic systolic CHF (congestive heart failure) (HCC) Dialysis to manage fluid, last EF 20 to 25% with moderate mitral regurgitation.  Patient on Toprol-XL and torsemide.  End-stage renal disease on hemodialysis [facility]) Patient will have dialysis tomorrow and antibiotics with dialysis.  Hyperkalemia On presentation.  Improved with dialysis.  Pressure injury of skin Present on admission, sacrum stage I.  See full description below.  AIDS (acquired immune deficiency syndrome) (HCC) CD4 count 47, patient on Biktarvy and dapsone.  Epistaxis Advised not to blow her nose.  No further nosebleeds.  Hold Eliquis.  As needed Afrin nasal spray.  Abdominal pain Left-sided abdominal pain and back pain.  Improved with Solu-Medrol.  Switch over to a prednisone taper.  Thrombocytopenia (HCC) Platelet count up to 157.  Chronic in nature  Headache MRI of the brain did not show any acute infarct or event.  Patient wants to follow-up with neurology as outpatient.  Hyponatremia Dialysis to manage         Consultants: Infectious disease, interventional radiology, nephrology, vascular surgery Procedures performed: 2 left-sided thoracentesis, 1 paracentesis Disposition: Home Diet recommendation:  Cardiac diet DISCHARGE MEDICATION: Allergies as of 04/26/2023        Reactions   Neosporin [bacitracin-polymyxin B] Rash   Sulfa Antibiotics Rash        Medication List     STOP taking these medications    apixaban 5 MG Tabs tablet Commonly known as: ELIQUIS   prednisoLONE acetate 1 % ophthalmic suspension Commonly known as: PRED FORTE       TAKE these medications    amoxicillin-clavulanate 500-125 MG tablet Commonly known as: Augmentin Take 1 tablet by mouth at bedtime. Start taking on: May 03, 2023   Biktarvy 50-200-25 MG Tabs tablet Generic drug: bictegravir-emtricitabine-tenofovir AF Take 1 tablet by mouth at bedtime.   calcium acetate 667 MG capsule Commonly known as: PHOSLO Take 2,001 mg by mouth 3 (three) times daily.   ceFAZolin IVPB Commonly known as: ANCEF Inject 2 g into the vein Every Tuesday,Thursday,and Saturday with dialysis for 4 days. Indication:  S. Pneumoniae bacteremia Last Day of Therapy:  05/01/2023 To be provided and administered at hemodialysis clinic Start taking on: April 27, 2023   dapsone 25 MG tablet Take 2 tablets (50 mg total) by mouth 2 (two) times daily. What changed: how much to take   FT Tussin DM Adult 20-200 MG/20ML Liqd Generic drug: Dextromethorphan-guaiFENesin Take 10 mLs by mouth every 4 (four) hours as needed.   gabapentin 300 MG capsule Commonly known as: NEURONTIN Take 300 mg by mouth  at bedtime.   metoprolol succinate 25 MG 24 hr tablet Commonly known as: TOPROL-XL Take 0.5 tablets (12.5 mg total) by mouth daily. What changed: how much to take   oxyCODONE-acetaminophen 5-325 MG tablet Commonly known as: Percocet Take 1 tablet by mouth every 6 (six) hours as needed for severe pain (pain score 7-10). What changed: when to take this   pantoprazole 40 MG tablet Commonly known as: PROTONIX Take 40 mg by mouth daily.   predniSONE 10 MG tablet Commonly known as: DELTASONE Take 4 tablets (40 mg total) by mouth daily for 1 day, THEN 3 tablets (30 mg total) daily  for 1 day, THEN 2 tablets (20 mg total) daily for 2 days, THEN 1 tablet (10 mg total) daily for 2 days, THEN 0.5 tablets (5 mg total) daily for 2 days. Start taking on: April 26, 2023   torsemide 20 MG tablet Commonly known as: DEMADEX Take 2 tablets by mouth daily.   traZODone 50 MG tablet Commonly known as: DESYREL Take 50 mg by mouth at bedtime as needed for sleep.               Home Infusion Instuctions  (From admission, onward)           Start     Ordered   04/26/23 0000  Home infusion instructions       Comments: With dialysis  Question:  Instructions  Answer:  Flushing of vascular access device: 0.9% NaCl pre/post medication administration and prn patency; Heparin 100 u/ml, 5ml for implanted ports and Heparin 10u/ml, 5ml for all other central venous catheters.   04/26/23 1320            Follow-up Information     [place] Follow up in 5 day(s).   Contact information: [address] [place] [address] [phone]         Your infectious disease specialist Follow up in 2 week(s).                 Discharge Exam: Filed Weights   04/17/23 2128 04/23/23 0750 04/23/23 1252  Weight: 41.6 kg 52.6 kg 52.6 kg   Physical Exam HENT:     Head: Normocephalic.  Eyes:     General: Lids are normal.     Conjunctiva/sclera: Conjunctivae normal.  Cardiovascular:     Rate and Rhythm: Normal rate and regular rhythm.     Heart sounds: S1 normal and S2 normal. Murmur heard.     Systolic murmur is present with a grade of 2/6.  Pulmonary:     Breath sounds: Examination of the left-lower field reveals decreased breath sounds and rhonchi. Decreased breath sounds and rhonchi present. No wheezing or rales.  Abdominal:     General: There is distension.     Palpations: Abdomen is soft.     Tenderness: There is no abdominal tenderness.     Comments: Slight distention before paracentesis.  Musculoskeletal:     Right lower leg: No swelling.      Left lower leg: No swelling.  Skin:    General: Skin is warm.     Findings: No rash.  Neurological:     Mental Status: She is alert and oriented to person, place, and time.      Condition at discharge: stable  The results of significant diagnostics from this hospitalization (including imaging, microbiology, ancillary and laboratory) are listed below for reference.   Imaging Studies: MR BRAIN WO CONTRAST  Result Date:  04/25/2023 CLINICAL DATA:  Initial evaluation for headache. EXAM: MRI HEAD WITHOUT CONTRAST TECHNIQUE: Multiplanar, multiecho pulse sequences of the brain and surrounding structures were obtained without intravenous contrast. COMPARISON:  None Available. FINDINGS: Brain: Cerebral volume within normal limits. Few scattered subcentimeter foci of T2/FLAIR signal abnormality seen involving the periventricular deep white matter of both cerebral hemispheres, most pronounced about the frontal lobes bilaterally. Findings are nonspecific, but overall mild in nature. No evidence for acute or subacute ischemia. Gray-white matter differentiation maintained. No acute intracranial hemorrhage. Single chronic microhemorrhage noted at the inferior aspect of the right basal ganglia, of doubtful significance in isolation. No mass lesion, midline shift or mass effect. No hydrocephalus or extra-axial fluid collection. Partially empty sella noted. Suprasellar region within normal limits. Vascular: Major intracranial vascular flow voids are maintained. Skull and upper cervical spine: Craniocervical junction within normal limits. Diffuse loss of normal bone marrow signal, nonspecific but can be seen with anemia, smoking, obesity, and infiltrative/myelofibrotic marrow processes. No scalp soft tissue abnormality. Sinuses/Orbits: Globes orbital soft tissues within normal limits. Scattered mucosal thickening noted about the ethmoidal air cells and maxillary sinuses. No significant mastoid effusion. Other:  None. IMPRESSION: 1. No acute intracranial abnormality. 2. Few scattered subcentimeter foci of T2/FLAIR signal abnormality involving the supratentorial cerebral white matter, overall mild in nature. Findings are nonspecific, but most commonly seen in the setting of chronic microvascular ischemic disease. Sequelae of complicated migraines would be the primary differential consideration given the history of headaches. 3. Partially empty sella. While this is often a normal variant, this can also be seen in the setting of idiopathic intracranial hypertension. Electronically Signed   By: Rise Mu M.D.   On: 04/25/2023 04:53   [place] ASCITES (ABDOMEN LIMITED)  Result Date: 04/23/2023 INDICATION: Patient with history of end-stage renal disease and recurrent ascites. Request received for therapeutic paracentesis EXAM: ULTRASOUND ABDOMEN LIMITED FINDINGS: Imaging of all 4 quadrants of the abdomen reveal small amount of ascites left lower quadrant. IMPRESSION: Small amount of ascites seen on ultrasound. After discussion of risks versus benefits of procedure, patient decided to defer paracentesis until a later time. Exam conducted by Mina Marble, PA-C Electronically Signed   By: Olive Bass M.D.   On: 04/23/2023 16:44   [place] UPPER EXTREMITY ARTERIAL LEFT LIMITED (GRAFT,SINGLE VESSEL)  Result Date: 04/23/2023 CLINICAL DATA:  Evaluate for patency of left upper extremity arteriovenous fistula. EXAM: LEFT UPPER EXTREMITY ARTERIAL DUPLEX SCAN TECHNIQUE: Gray-scale sonography as well as color Doppler and duplex ultrasound was performed to evaluate the arteries of the upper extremity. COMPARISON:  None Available. FINDINGS: Left forearm arteriovenous fistula is patent. Atherosclerotic changes in the left radial artery. Outflow vein is patent throughout the forearm. Outflow vein is patent at the antecubital fossa. Velocities were not obtained within the outflow vein. IMPRESSION: Left upper extremity arteriovenous  fistula is patent. Electronically Signed   By: Richarda Overlie M.D.   On: 04/23/2023 09:37   US THORACENTESIS ASP PLEURAL SPACE W/IMG GUIDE  Result Date: 04/22/2023 INDICATION: Patient with a history of end-stage renal disease presents with pleural effusion secondary to pneumonia. Diagnostic and therapeutic thoracentesis requested. EXAM: ULTRASOUND GUIDED DIAGNOSTIC and THERAPEUTIC THORACENTESIS MEDICATIONS: 1% lidocaine 10 mL COMPLICATIONS: None immediate. PROCEDURE: An ultrasound guided thoracentesis was thoroughly discussed with the patient and questions answered. The benefits, risks, alternatives and complications were also discussed. The patient understands and wishes to proceed with the procedure. Written consent was obtained. Ultrasound was performed to localize and mark an adequate pocket of fluid  in the LEFT chest. The area was then prepped and draped in the normal sterile fashion. 1% Lidocaine was used for local anesthesia. Under ultrasound guidance a 6 Fr Safe-T-Centesis catheter was introduced. Thoracentesis was performed. Procedure stopped early due to patient complaint of discomfort. The catheter was removed and a dressing applied. FINDINGS: A total of approximately 800 mL of blood-tinged fluid was removed. Samples were sent to the laboratory as requested by the clinical team. IMPRESSION: Successful ultrasound guided diagnostic and therapeutic LEFT thoracentesis yielding 800 mL of serosanguineous pleural fluid. Procedure performed by Alwyn Ren NP Electronically Signed   By: Roanna Banning M.D.   On: 04/22/2023 16:29   DG Chest Port 1 View  Result Date: 04/22/2023 CLINICAL DATA:  [id number] Back pain [id number] Status post thoracentesis [id number] EXAM: PORTABLE CHEST 1 VIEW COMPARISON:  IR thoracentesis, earlier same day. Chest XR, 04/20/2023 and 04/17/2023. CT chest, 04/17/2023. FINDINGS: Support lines: RIGHT chest dialysis catheter catheter tip within the proximal RA. Overlying cutaneous leads.  Enlarged cardiac silhouette. Minimal perihilar interstitial thickening. The RIGHT lung is relatively clear. Mildly improved aeration of the LEFT chest with small volume residual LEFT pleural effusion. No pneumothorax. LEFT basilar consolidation. No interval osseous abnormality. IMPRESSION: 1. Lines and tubes as above 2. Improved aeration of the LEFT chest post thoracentesis with mild residual pleural effusion. No pneumothorax. 3. LEFT basilar consolidation, favor atelectasis however superimposed infection can appear similar. Electronically Signed   By: Roanna Banning M.D.   On: 04/22/2023 16:25   CT RENAL STONE STUDY  Result Date: 04/22/2023 CLINICAL DATA:  Left flank pain with hematuria. EXAM: CT ABDOMEN AND PELVIS WITHOUT CONTRAST TECHNIQUE: Multidetector CT imaging of the abdomen and pelvis was performed following the standard protocol without IV contrast. RADIATION DOSE REDUCTION: This exam was performed according to the departmental dose-optimization program which includes automated exposure control, adjustment of the mA and/or kV according to patient size and/or use of iterative reconstruction technique. COMPARISON:  May 28, 2022. FINDINGS: Lower chest: Moderate size left pleural effusion is noted with associated left basilar atelectasis or infiltrate. Hepatobiliary: No focal liver abnormality is seen. Status post cholecystectomy. No biliary dilatation. Pancreas: Unremarkable. No pancreatic ductal dilatation or surrounding inflammatory changes. Spleen: Mild splenomegaly. Adrenals/Urinary Tract: Adrenal glands appear normal. Bilateral renal atrophy is noted. No hydronephrosis or renal obstruction is noted. Urinary bladder is unremarkable. Stomach/Bowel: Stomach is within normal limits. Appendix appears normal. No evidence of bowel wall thickening, distention, or inflammatory changes. Vascular/Lymphatic: Aortic atherosclerosis. No enlarged abdominal or pelvic lymph nodes. Reproductive: Uterus and  bilateral adnexa are unremarkable. Other: Mild ascites is noted.  No hernia is noted. Musculoskeletal: No acute or significant osseous findings. IMPRESSION: Moderate size left pleural effusion is noted with associated left basilar atelectasis or infiltrate. Mild splenomegaly. Mild ascites. Bilateral renal atrophy is noted suggesting end-stage renal disease. Aortic Atherosclerosis (ICD10-I70.0). Electronically Signed   By: Lupita Raider M.D.   On: 04/22/2023 13:07   DG Chest Port 1 View  Result Date: 04/20/2023 CLINICAL DATA:  Status post left thoracentesis. EXAM: PORTABLE CHEST 1 VIEW COMPARISON:  April 17, 2023. FINDINGS: Stable cardiomediastinal silhouette. No pneumothorax is noted status post left thoracentesis. Moderate left pleural effusion remains. IMPRESSION: No pneumothorax status post left thoracentesis. Electronically Signed   By: Lupita Raider M.D.   On: 04/20/2023 15:18   US THORACENTESIS ASP PLEURAL SPACE W/IMG GUIDE  Result Date: 04/20/2023 INDICATION: 47 year old female. History of congestive heart failure. End-stage renal disease on hemodialysis. Presents to  the ED with shortness of breath found to have a left-sided pleural effusion request is for therapeutic and diagnostic left-sided thoracentesis EXAM: ULTRASOUND GUIDED LEFT THORACENTESIS MEDICATIONS: Lidocaine 1% 10 mL COMPLICATIONS: None immediate. PROCEDURE: An ultrasound guided thoracentesis was thoroughly discussed with the patient and questions answered. The benefits, risks, alternatives and complications were also discussed. The patient understands and wishes to proceed with the procedure. Written consent was obtained. Ultrasound was performed to localize and mark an adequate pocket of fluid in the left chest. The area was then prepped and draped in the normal sterile fashion. 1% Lidocaine was used for local anesthesia. Under ultrasound guidance a 6 Fr Safe-T-Centesis catheter was introduced. Thoracentesis was performed. Per  patient request the procedure was aborted after 600 mL of fluid was removed. The catheter was removed and a dressing applied. FINDINGS: A total of approximately 600 mL of amber color fluid was removed. Samples were sent to the laboratory as requested by the clinical team. IMPRESSION: Successful ultrasound guided therapeutic and diagnostic left-sided thoracentesis yielding 600 mL of pleural fluid. Performed by Anders Grant NP Electronically Signed   By: Olive Bass M.D.   On: 04/20/2023 13:26   ECHOCARDIOGRAM COMPLETE  Result Date: 04/19/2023    ECHOCARDIOGRAM REPORT   Patient Name:   Adhira Yorks Date of Exam: 04/19/2023 Medical Rec #:  [id number]  Height:       63.0 in Accession #:    [id number] Weight:       91.7 lb Date of Birth:  1975-11-22   BSA:          1.388 m Patient Age:    47 years   BP:           83/60 mmHg Patient Gender: F          HR:           88 bpm. Exam Location:  [facility] Procedure: 2D Echo, 3D Echo, Cardiac Doppler, Color Doppler and Strain Analysis Indications:     CHF, Dilated Cardiomyopathy  History:         Patient has prior history of Echocardiogram examinations, most                  recent 01/27/2019. CHF and Cardiomyopathy, COPD,                  Arrythmias:LBBB; Risk Factors:Hypertension and Current Smoker.                  CKD, ESRD on Dialysis.  Sonographer:     Mikki Harbor Referring Phys:  [id number] Sunnie Nielsen Diagnosing Phys: Lorine Bears MD  Sonographer Comments: Global longitudinal strain was attempted. IMPRESSIONS  1. Left ventricular ejection fraction, by estimation, is 20 to 25%. The left ventricle has severely decreased function. The left ventricle demonstrates global hypokinesis. The left ventricular internal cavity size was severely dilated. Left ventricular diastolic parameters are indeterminate. The average left ventricular global longitudinal strain is -6.6 %. The global longitudinal strain is abnormal.  2. Right ventricular systolic function is moderately  reduced. The right ventricular size is mildly enlarged. There is mildly elevated pulmonary artery systolic pressure.  3. Left atrial size was moderately dilated.  4. Right atrial size was moderately dilated.  5. The mitral valve is normal in structure. Moderate mitral valve regurgitation. No evidence of mitral stenosis.  6. Tricuspid valve regurgitation is moderate to severe.  7. The aortic valve is normal in structure. Aortic valve regurgitation is not visualized. No aortic  stenosis is present.  8. Moderately dilated pulmonary artery.  9. The inferior vena cava is dilated in size with <50% respiratory variability, suggesting right atrial pressure of 15 mmHg. FINDINGS  Left Ventricle: Left ventricular ejection fraction, by estimation, is 20 to 25%. The left ventricle has severely decreased function. The left ventricle demonstrates global hypokinesis. The average left ventricular global longitudinal strain is -6.6 %. The global longitudinal strain is abnormal. The left ventricular internal cavity size was severely dilated. There is no left ventricular hypertrophy. Abnormal (paradoxical) septal motion, consistent with left bundle branch block. Left ventricular diastolic parameters are indeterminate. Right Ventricle: The right ventricular size is mildly enlarged. No increase in right ventricular wall thickness. Right ventricular systolic function is moderately reduced. There is mildly elevated pulmonary artery systolic pressure. The tricuspid regurgitant velocity is 2.66 m/s, and with an assumed right atrial pressure of 15 mmHg, the estimated right ventricular systolic pressure is 43.3 mmHg. Left Atrium: Left atrial size was moderately dilated. Right Atrium: Right atrial size was moderately dilated. Pericardium: There is no evidence of pericardial effusion. Mitral Valve: The mitral valve is normal in structure. Moderate mitral valve regurgitation. No evidence of mitral valve stenosis. MV peak gradient, 6.8 mmHg. The  mean mitral valve gradient is 3.0 mmHg. Tricuspid Valve: The tricuspid valve is normal in structure. Tricuspid valve regurgitation is moderate to severe. No evidence of tricuspid stenosis. Aortic Valve: The aortic valve is normal in structure. Aortic valve regurgitation is not visualized. No aortic stenosis is present. Aortic valve mean gradient measures 4.0 mmHg. Aortic valve peak gradient measures 8.6 mmHg. Aortic valve area, by VTI measures 2.07 cm. Pulmonic Valve: The pulmonic valve was normal in structure. Pulmonic valve regurgitation is mild. No evidence of pulmonic stenosis. Aorta: The aortic root is normal in size and structure. Pulmonary Artery: The pulmonary artery is moderately dilated. Venous: The inferior vena cava is dilated in size with less than 50% respiratory variability, suggesting right atrial pressure of 15 mmHg. IAS/Shunts: No atrial level shunt detected by color flow Doppler. Additional Comments: There is a small pleural effusion in the left lateral region.  LEFT VENTRICLE PLAX 2D LVIDd:         6.70 cm      Diastology LVIDs:         5.80 cm      LV e' medial:    8.70 cm/s LV PW:         1.30 cm      LV E/e' medial:  11.4 LV IVS:        1.10 cm      LV e' lateral:   9.14 cm/s LVOT diam:     1.90 cm      LV E/e' lateral: 10.9 LV SV:         46 LV SV Index:   33           2D Longitudinal Strain LVOT Area:     2.84 cm     2D Strain GLS Avg:     -6.6 %  LV Volumes (MOD) LV vol d, MOD A2C: 207.0 ml LV vol d, MOD A4C: 147.0 ml LV vol s, MOD A2C: 154.0 ml LV vol s, MOD A4C: 111.0 ml LV SV MOD A2C:     53.0 ml LV SV MOD A4C:     147.0 ml LV SV MOD BP:      43.4 ml RIGHT VENTRICLE RV Basal diam:  4.15 cm RV Mid diam:  3.60 cm RV S prime:     10.40 cm/s TAPSE (M-mode): 1.7 cm LEFT ATRIUM             Index        RIGHT ATRIUM           Index LA diam:        4.70 cm 3.39 cm/m   RA Area:     25.40 cm LA Vol (A2C):   77.9 ml 56.11 ml/m  RA Volume:   89.40 ml  64.40 ml/m LA Vol (A4C):   61.3 ml 44.16  ml/m LA Biplane Vol: 73.4 ml 52.87 ml/m  AORTIC VALVE                    PULMONIC VALVE AV Area (Vmax):    2.28 cm     PV Vmax:       1.07 m/s AV Area (Vmean):   1.99 cm     PV Peak grad:  4.6 mmHg AV Area (VTI):     2.07 cm AV Vmax:           147.00 cm/s AV Vmean:          92.000 cm/s AV VTI:            0.223 m AV Peak Grad:      8.6 mmHg AV Mean Grad:      4.0 mmHg LVOT Vmax:         118.00 cm/s LVOT Vmean:        64.500 cm/s LVOT VTI:          0.163 m LVOT/AV VTI ratio: 0.73  AORTA Ao Root diam: 3.20 cm Ao Asc diam:  2.80 cm MITRAL VALVE                  TRICUSPID VALVE MV Area (PHT): 6.96 cm       TR Peak grad:   28.3 mmHg MV Area VTI:   2.19 cm       TR Vmax:        266.00 cm/s MV Peak grad:  6.8 mmHg MV Mean grad:  3.0 mmHg       SHUNTS MV Vmax:       1.30 m/s       Systemic VTI:  0.16 m MV Vmean:      77.1 cm/s      Systemic Diam: 1.90 cm MV Decel Time: 109 msec MR Peak grad:    65.4 mmHg MR Mean grad:    38.5 mmHg MR Vmax:         404.50 cm/s MR Vmean:        287.0 cm/s MR PISA:         1.57 cm MR PISA Eff ROA: 36 mm MR PISA Radius:  0.50 cm MV E velocity: 99.60 cm/s MV A velocity: 89.60 cm/s MV E/A ratio:  1.11 Lorine Bears MD Electronically signed by Lorine Bears MD Signature Date/Time: 04/19/2023/5:25:46 PM    Final    CT CHEST WO CONTRAST  Result Date: 04/17/2023 CLINICAL DATA:  Pneumonia, complication suspected, xray done EXAM: CT CHEST WITHOUT CONTRAST TECHNIQUE: Multidetector CT imaging of the chest was performed following the standard protocol without IV contrast. RADIATION DOSE REDUCTION: This exam was performed according to the departmental dose-optimization program which includes automated exposure control, adjustment of the mA and/or kV according to patient size and/or use of iterative reconstruction technique. COMPARISON:  01/17/2022 FINDINGS: Cardiovascular: Extensive multi-vessel coronary artery  calcification. Moderate cardiomegaly is again identified with biventricular  enlargement. No pericardial effusion. The central pulmonary arteries are enlarged in keeping with changes of pulmonary arterial hypertension. Mild atherosclerotic calcification within the thoracic aorta. No aortic aneurysm. Right internal jugular hemodialysis catheter tip noted within the right atrium. Mediastinum/Nodes: Visualized thyroid is unremarkable. Shotty mediastinal adenopathy, predominantly within the left prevascular and right paratracheal region is nonspecific, possibly reactive in nature. This appears progressive since prior examination. The esophagus is unremarkable. Lungs/Pleura: Moderate left pleural effusion is present with compressive atelectasis of the left lung base, slightly enlarged since prior examination. There is, however, superimposed dense consolidation within the left lower lobe and basilar lingula in keeping with changes of acute lobar pneumonia in the appropriate clinical setting. There is chronic consolidation within the lingula medially, stable since prior examination, likely post inflammatory in nature. Stable 4 mm subpleural pulmonary nodule within the right upper lobe, axial image # 70/4, safely considered benign. No pneumothorax or pleural effusion on the right. There is extensive airway impaction within the left lower lobe and basilar lingula. No central obstructing mass. Upper Abdomen: At least moderate ascites noted within the visualized upper abdomen. Stable splenomegaly, incompletely evaluated on this examination. Musculoskeletal: No acute bone abnormality. No lytic or blastic bone lesion. IMPRESSION: 1. Dense consolidation within the left lower lobe and basilar lingula in keeping with changes of acute lobar pneumonia in the appropriate clinical setting. Extensive airway impaction within the left lower lobe and basilar lingula. No central obstructing mass. 2. Moderate left pleural effusion with compressive atelectasis of the left lung base, slightly enlarged since prior  examination. 3. Extensive multi-vessel coronary artery calcification. Moderate cardiomegaly with biventricular enlargement. 4. Morphologic changes in keeping with pulmonary arterial hypertension. 5. At least moderate ascites within the visualized upper abdomen. Stable splenomegaly. Together, these findings may reflect the sequela of portal venous hypertension, though this is incompletely evaluated on this exam. Aortic Atherosclerosis (ICD10-I70.0). Electronically Signed   By: Helyn Numbers M.D.   On: 04/17/2023 21:26   DG Chest Port 1 View  Result Date: 04/17/2023 CLINICAL DATA:  Acute chest pain EXAM: PORTABLE CHEST 1 VIEW COMPARISON:  12/24/2022 FINDINGS: Heart is enlarged with central vascular congestion. Dense left mid and lower lung collapse/consolidation concerning for pneumonia. Suspect associated left effusion. Right lung remains clear. No pneumothorax. Trachea midline. Aorta atherosclerotic. Right IJ dialysis catheter tip SVC RA junction as before. IMPRESSION: 1. Cardiomegaly with central vascular congestion. 2. Dense left mid and lower lung collapse/consolidation concerning for pneumonia. Electronically Signed   By: Judie Petit.  Shick M.D.   On: 04/17/2023 14:38   VAS US DUPLEX DIALYSIS ACCESS (AVF, AVG)  Result Date: 04/12/2023 DIALYSIS ACCESS Patient Name:  KAMBREE Coyle  Date of Exam:   04/12/2023 Medical Rec #: [id number]   Accession #:    [id number] Date of Birth: 1975-08-11    Patient Gender: F Patient Age:   22 years Exam Location:  [facility] Procedure:      VAS US DUPLEX DIALYSIS ACCESS (AVF, AVG) Referring Phys: Levora Dredge --------------------------------------------------------------------------------  Reason for Exam: S/P surgical revision. Access Site: Left Upper Extremity. Access Type: Radial-cephalic AVF. History: 02/12/2023 revision / new left radio-cephalic fistula. Comparison Study: 03/04/2022 Performing Technologist: Salvadore Farber RVT  Examination Guidelines: A complete  evaluation includes B-mode imaging, spectral Doppler, color Doppler, and power Doppler as needed of all accessible portions of each vessel. Unilateral testing is considered an integral part of a complete examination. Limited examinations for reoccurring indications may be  performed as noted.  Findings: +--------------------+----------+-----------------+--------+ AVF                 PSV (cm/s)Flow Vol (mL/min)Comments +--------------------+----------+-----------------+--------+ Native artery inflow   193          1045                +--------------------+----------+-----------------+--------+ AVF Anastomosis        665                              +--------------------+----------+-----------------+--------+  +------------+----------+-------------+----------+--------+ OUTFLOW VEINPSV (cm/s)Diameter (cm)Depth (cm)Describe +------------+----------+-------------+----------+--------+ Dist UA         75                                    +------------+----------+-------------+----------+--------+ AC Fossa       170                                    +------------+----------+-------------+----------+--------+ Prox Forearm   230                                    +------------+----------+-------------+----------+--------+ Mid Forearm    181                                    +------------+----------+-------------+----------+--------+ Dist Forearm   499                                    +------------+----------+-------------+----------+--------+   Summary: Patent left radiocephalic AVF s/p revision x 2 months with high velocity flow seen at the new anastomosis site between mid to distal forearm. Good flow volume is calculated. Dialysis has not started re-using AVF at this time.  *See table(s) above for measurements and observations.  Diagnosing physician: Levora Dredge MD Electronically signed by Levora Dredge MD on 04/12/2023 at 5:14:04 PM.    --------------------------------------------------------------------------------   Final    US Paracentesis  Result Date: 04/08/2023 INDICATION: Patient with history of end-stage renal disease and recurrent ascites. Request received for therapeutic paracentesis. EXAM: ULTRASOUND GUIDED  PARACENTESIS MEDICATIONS: 20 mL 1% lidocaine COMPLICATIONS: None immediate. PROCEDURE: Informed written consent was obtained from the patient after a discussion of the risks, benefits and alternatives to treatment. A timeout was performed prior to the initiation of the procedure. Initial ultrasound scanning demonstrates a moderate amount of ascites within the left lower abdominal quadrant. The left lower abdomen was prepped and draped in the usual sterile fashion. 1% lidocaine was used for local anesthesia. Following this, a 19 gauge, 7-cm, Yueh catheter was introduced. An ultrasound image was saved for documentation purposes. The paracentesis was performed. The catheter was removed and a dressing was applied. The patient tolerated the procedure well without immediate post procedural complication. FINDINGS: A total of approximately 2.7 L of amber fluid was removed. Ordering provider did not request laboratory samples. IMPRESSION: Successful ultrasound-guided paracentesis yielding 2.7 liters of peritoneal fluid. Procedure performed by Mina Marble, PA-C Electronically Signed   By: Irish Lack M.D.   On: 04/08/2023 16:16    Microbiology: Results for orders placed  or performed during the hospital encounter of 04/17/23  Blood culture (routine x 2)     Status: Abnormal   Collection Time: 04/17/23  2:20 PM   Specimen: BLOOD  Result Value Ref Range Status   Specimen Description   Final    BLOOD BLOOD RIGHT ARM Performed at [facility], [address], [place], [place] [address]    Special Requests   Final    BOTTLES DRAWN AEROBIC AND ANAEROBIC Blood Culture results may not be optimal due to an excessive  volume of blood received in culture bottles Performed at [facility], [address] [place], [place] [address]    Culture  Setup Time   Final    GRAM POSITIVE COCCI IN BOTH AEROBIC AND ANAEROBIC BOTTLES STREPTOCOCCUS PNEUMONIAE CRITICAL RESULT CALLED TO, READ BACK BY AND VERIFIED WITH: Regino Bellow RN @ 0400 04/18/23 [facility]    Culture STREPTOCOCCUS PNEUMONIAE (A)  Final   Report Status 04/20/2023 FINAL  Final   Organism ID, Bacteria STREPTOCOCCUS PNEUMONIAE  Final      Susceptibility   Streptococcus pneumoniae - MIC*    ERYTHROMYCIN <=0.12 SENSITIVE Sensitive     LEVOFLOXACIN 0.5 SENSITIVE Sensitive     VANCOMYCIN 0.5 SENSITIVE Sensitive     PENICILLIN (meningitis) <=0.06 SENSITIVE Sensitive     PENO - penicillin <=0.06      PENICILLIN (non-meningitis) <=0.06 SENSITIVE Sensitive     PENICILLIN (oral) <=0.06 SENSITIVE Sensitive     CEFTRIAXONE (non-meningitis) <=0.12 SENSITIVE Sensitive     CEFTRIAXONE (meningitis) <=0.12 SENSITIVE Sensitive     * STREPTOCOCCUS PNEUMONIAE  Blood Culture ID Panel (Reflexed)     Status: Abnormal   Collection Time: 04/17/23  2:20 PM  Result Value Ref Range Status   Enterococcus faecalis NOT DETECTED NOT DETECTED Final   Enterococcus Faecium NOT DETECTED NOT DETECTED Final   Listeria monocytogenes NOT DETECTED NOT DETECTED Final   Staphylococcus species NOT DETECTED NOT DETECTED Final   Staphylococcus aureus (BCID) NOT DETECTED NOT DETECTED Final   Staphylococcus epidermidis NOT DETECTED NOT DETECTED Final   Staphylococcus lugdunensis NOT DETECTED NOT DETECTED Final   Streptococcus species DETECTED (A) NOT DETECTED Final    Comment: CRITICAL RESULT CALLED TO, READ BACK BY AND VERIFIED WITH: NATHAN BELEU @ 0400 04/18/23 [facility]    Streptococcus agalactiae NOT DETECTED NOT DETECTED Final   Streptococcus pneumoniae DETECTED (A) NOT DETECTED Final    Comment: CRITICAL RESULT CALLED TO, READ BACK BY AND VERIFIED WITH: NATHAN BELEU @ 0400 04/18/23  [facility]    Streptococcus pyogenes NOT DETECTED NOT DETECTED Final   A.calcoaceticus-baumannii NOT DETECTED NOT DETECTED Final   Bacteroides fragilis NOT DETECTED NOT DETECTED Final   Enterobacterales NOT DETECTED NOT DETECTED Final   Enterobacter cloacae complex NOT DETECTED NOT DETECTED Final   Escherichia coli NOT DETECTED NOT DETECTED Final   Klebsiella aerogenes NOT DETECTED NOT DETECTED Final   Klebsiella oxytoca NOT DETECTED NOT DETECTED Final   Klebsiella pneumoniae NOT DETECTED NOT DETECTED Final   Proteus species NOT DETECTED NOT DETECTED Final   Salmonella species NOT DETECTED NOT DETECTED Final   Serratia marcescens NOT DETECTED NOT DETECTED Final   Haemophilus influenzae NOT DETECTED NOT DETECTED Final   Neisseria meningitidis NOT DETECTED NOT DETECTED Final   Pseudomonas aeruginosa NOT DETECTED NOT DETECTED Final   Stenotrophomonas maltophilia NOT DETECTED NOT DETECTED Final   Candida albicans NOT DETECTED NOT DETECTED Final   Candida auris NOT DETECTED NOT DETECTED Final   Candida glabrata NOT  DETECTED NOT DETECTED Final   Candida krusei NOT DETECTED NOT DETECTED Final   Candida parapsilosis NOT DETECTED NOT DETECTED Final   Candida tropicalis NOT DETECTED NOT DETECTED Final   Cryptococcus neoformans/gattii NOT DETECTED NOT DETECTED Final    Comment: Performed at [facility], [address] [place], [place] [address]  Blood culture (routine x 2)     Status: Abnormal   Collection Time: 04/17/23  2:23 PM   Specimen: BLOOD  Result Value Ref Range Status   Specimen Description   Final    BLOOD BLOOD RIGHT HAND Performed at [facility], [address] [place], [place] [address]    Special Requests   Final    BOTTLES DRAWN AEROBIC AND ANAEROBIC Blood Culture results may not be optimal due to an excessive volume of blood received in culture bottles Performed at [facility], [address] [place], [place] [address]    Culture  Setup Time    Final    IN BOTH AEROBIC AND ANAEROBIC BOTTLES GRAM POSITIVE COCCI STREPTOCOCCUS PNEUMONIAE CRITICAL RESULT CALLED TO, READ BACK BY AND VERIFIED WITH: NATHAN BELEU @ 0400 04/18/23 [facility]    Culture (A)  Final    STREPTOCOCCUS PNEUMONIAE SUSCEPTIBILITIES PERFORMED ON PREVIOUS CULTURE WITHIN THE LAST 5 DAYS. Performed at [facility], [address], [place], [place] [address]    Report Status 04/20/2023 FINAL  Final  MRSA Next Gen by PCR, Nasal     Status: None   Collection Time: 04/18/23  9:39 AM   Specimen: Nasal Mucosa; Nasal Swab  Result Value Ref Range Status   MRSA by PCR Next Gen NOT DETECTED NOT DETECTED Final    Comment: (NOTE) The GeneXpert MRSA Assay (FDA approved for NASAL specimens only), is one component of a comprehensive MRSA colonization surveillance program. It is not intended to diagnose MRSA infection nor to guide or monitor treatment for MRSA infections. Test performance is not FDA approved in patients less than 49 years old. Performed at [facility], [address] [place], [place] [address]   Culture, blood (Routine X 2) w Reflex to ID Panel     Status: None   Collection Time: 04/20/23  4:43 AM   Specimen: BLOOD  Result Value Ref Range Status   Specimen Description BLOOD BLOOD RIGHT HAND  Final   Special Requests   Final    BOTTLES DRAWN AEROBIC AND ANAEROBIC Blood Culture results may not be optimal due to an excessive volume of blood received in culture bottles   Culture   Final    NO GROWTH 5 DAYS Performed at [facility], [address] [place], [place] [address]    Report Status 04/25/2023 FINAL  Final  Culture, blood (Routine X 2) w Reflex to ID Panel     Status: None   Collection Time: 04/20/23  4:43 AM   Specimen: BLOOD  Result Value Ref Range Status   Specimen Description BLOOD  R FOA  Final   Special Requests   Final    BOTTLES DRAWN AEROBIC AND ANAEROBIC Blood Culture results may not be optimal due to an excessive  volume of blood received in culture bottles   Culture   Final    NO GROWTH 5 DAYS Performed at [facility], [address] [place], [place] [address]    Report Status 04/25/2023 FINAL  Final  Body fluid culture w Gram Stain     Status: None   Collection Time: 04/20/23  12:09 PM   Specimen: PATH Cytology Pleural fluid  Result Value Ref Range Status   Specimen Description   Final    FLUID Performed at [facility], [address] [place], [place] [address]    Special Requests   Final     CYTO PLEU Performed at [facility], [address] [place], [place] [address]    Gram Stain   Final    ABUNDANT WBC PRESENT,BOTH PMN AND MONONUCLEAR NO ORGANISMS SEEN    Culture   Final    NO GROWTH 3 DAYS Performed at [facility], [address] [place], [place] [address]    Report Status 04/23/2023 FINAL  Final  Body fluid culture w Gram Stain     Status: None   Collection Time: 04/22/23  4:35 PM   Specimen: PATH Cytology Pleural fluid  Result Value Ref Range Status   Specimen Description FLUID PLEURAL  Final   Special Requests NONE  Final   Gram Stain   Final    WBC PRESENT,BOTH PMN AND MONONUCLEAR NO ORGANISMS SEEN CYTOSPIN SMEAR    Culture   Final    NO GROWTH 3 DAYS Performed at [facility], [address], [place], [place] [address]    Report Status 04/26/2023 FINAL  Final  Expectorated Sputum Assessment w Gram Stain, Rflx to Resp Cult     Status: None   Collection Time: 04/22/23 10:20 PM   Specimen: Sputum  Result Value Ref Range Status   Specimen Description SPUTUM  EXPSU  Final   Special Requests NONE  Final   Sputum evaluation   Final    THIS SPECIMEN IS ACCEPTABLE FOR SPUTUM CULTURE Performed at [facility], [address], [place], [place] [address]    Report Status 04/22/2023 FINAL  Final  Culture, Respiratory w Gram Stain     Status: None   Collection Time: 04/22/23 10:20 PM   Specimen: SPU  Result Value  Ref Range Status   Specimen Description   Final    SPUTUM  EXPSU Performed at [facility], [address] [place], [place] [address]    Special Requests   Final    NONE Reflexed from [id number] Performed at [facility], [address] [place], [place] [address]    Gram Stain   Final    FEW SQUAMOUS EPITHELIAL CELLS PRESENT FEW WBC PRESENT,BOTH PMN AND MONONUCLEAR FEW GRAM POSITIVE COCCI IN PAIRS FEW GRAM NEGATIVE RODS    Culture   Final    Normal respiratory flora-no Staph aureus or Pseudomonas seen Performed at [facility], [address], [place], [place] [address]    Report Status 04/25/2023 FINAL  Final  Expectorated Sputum Assessment w Gram Stain, Rflx to Resp Cult     Status: None   Collection Time: 04/24/23 12:15 PM   Specimen: Sputum  Result Value Ref Range Status   Specimen Description SPUTUM  Final   Special Requests Normal  Final   Sputum evaluation   Final    THIS SPECIMEN IS ACCEPTABLE FOR SPUTUM CULTURE Performed at [facility], [address] [place], [place] [address]    Report Status 04/24/2023 FINAL  Final  Culture, Respiratory w Gram Stain     Status: None (Preliminary result)   Collection Time: 04/24/23 12:15 PM   Specimen: SPU  Result Value Ref Range Status   Specimen Description   Final    SPUTUM Performed at [facility],  [address] [place], [place] [address]    Special Requests   Final    Normal Reflexed from [id number] Performed at [facility], [address] [place], [place] [address]    Gram Stain   Final    FEW SQUAMOUS EPITHELIAL CELLS PRESENT FEW WBC PRESENT,BOTH PMN AND MONONUCLEAR FEW GRAM POSITIVE COCCI IN PAIRS FEW GRAM VARIABLE ROD    Culture   Final    CULTURE REINCUBATED FOR BETTER GROWTH Performed at [facility], [address] [place], [place] [address]    Report Status PENDING  Incomplete    Labs: CBC: Recent Labs  Lab 04/20/23 0443 04/22/23 0406  04/22/23 1430 04/23/23 0835 04/24/23 0433 04/25/23 0520 04/26/23 0743  WBC 5.0 3.6*  --  4.2 5.5  --  7.0  HGB 8.6* 6.2* 8.6* 7.8* 7.8* 7.3* 9.0*  HCT 26.5* 19.9*  --  24.3* 24.8*  --  28.5*  MCV 103.1* 105.9*  --  102.5* 104.2*  --  98.6  PLT 76* 74*  --  91* 94*  --  157   Basic Metabolic Panel: Recent Labs  Lab 04/20/23 0443 04/21/23 0459 04/22/23 0406 04/23/23 0835 04/26/23 0743  NA 126* 129* 129* 127* 130*  K 4.1 4.1 3.9 3.6 3.9  CL 90* 92* 94* 92* 93*  CO2 25 27 28 26 25   GLUCOSE 94 88 82 108* 113*  BUN 52* 80* 45* 42* 38*  CREATININE 6.48* 7.72* 4.35* 4.33* 4.51*  CALCIUM 7.7* 8.0* 8.1* 8.0* 8.0*  PHOS  --   --   --  3.0  --    Liver Function Tests: No results for input(s): "AST", "ALT", "ALKPHOS", "BILITOT", "PROT", "ALBUMIN" in the last 168 hours. CBG: Recent Labs  Lab 04/21/23 0740 04/21/23 1117  GLUCAP 71 82    Discharge time spent: greater than 30 minutes.  Signed: Alford Highland, MD [facility] 04/26/2023

## 2023-04-27 LAB — CULTURE, RESPIRATORY W GRAM STAIN
Culture: NORMAL
Special Requests: NORMAL

## 2023-05-06 ENCOUNTER — Other Ambulatory Visit: Payer: Self-pay | Admitting: Internal Medicine

## 2023-05-06 ENCOUNTER — Ambulatory Visit
Admission: RE | Admit: 2023-05-06 | Discharge: 2023-05-06 | Disposition: A | Discharge status: Home / Self Care | Payer: 59 | Source: Ambulatory Visit | Attending: Internal Medicine | Admitting: Internal Medicine

## 2023-05-06 DIAGNOSIS — R188 Other ascites: Secondary | ICD-10-CM | POA: Diagnosis present

## 2023-05-06 MED ORDER — LIDOCAINE HCL (PF) 1 % IJ SOLN
10.0000 mL | Freq: Once | INTRAMUSCULAR | Status: AC
  Administered 2023-05-06: 10 mL via INTRADERMAL
  Filled 2023-05-06: qty 10

## 2023-05-06 NOTE — Procedures (Signed)
PROCEDURE SUMMARY:  Successful US guided paracentesis from LLQ. Yielded 2.2 L of clear yellow fluid.  No immediate complications.  Pt tolerated well.   Specimen was not sent for labs.  EBL < 5mL  Cloretta Ned 05/06/2023 3:28 PM

## 2023-05-26 ENCOUNTER — Other Ambulatory Visit: Payer: Self-pay | Admitting: Internal Medicine

## 2023-05-26 DIAGNOSIS — R188 Other ascites: Secondary | ICD-10-CM

## 2023-05-27 ENCOUNTER — Ambulatory Visit
Admission: RE | Admit: 2023-05-27 | Discharge: 2023-05-27 | Disposition: A | Discharge status: Home / Self Care | Payer: 59 | Source: Ambulatory Visit | Attending: Internal Medicine | Admitting: Internal Medicine

## 2023-05-27 DIAGNOSIS — R188 Other ascites: Secondary | ICD-10-CM | POA: Diagnosis present

## 2023-05-27 DIAGNOSIS — N186 End stage renal disease: Secondary | ICD-10-CM | POA: Insufficient documentation

## 2023-05-27 MED ORDER — LIDOCAINE HCL (PF) 1 % IJ SOLN
10.0000 mL | Freq: Once | INTRAMUSCULAR | Status: AC
  Administered 2023-05-27: 10 mL via INTRADERMAL
  Filled 2023-05-27: qty 10

## 2023-05-27 NOTE — Procedures (Signed)
PROCEDURE SUMMARY:  Successful image-guided paracentesis from the right abdomen.  Yielded 1.7 liters of clear straw-colored peritoneal fluid.  No immediate complications.  EBL: zero Patient tolerated well.   Specimen were not sent for labs.  Please see imaging section of Epic for full dictation.  Bing Neighbors Lodema Parma PA-C 05/27/2023 1:42 PM

## 2023-05-31 ENCOUNTER — Other Ambulatory Visit (HOSPITAL_BASED_OUTPATIENT_CLINIC_OR_DEPARTMENT_OTHER): Payer: Self-pay

## 2023-06-07 ENCOUNTER — Other Ambulatory Visit: Payer: Self-pay | Admitting: Internal Medicine

## 2023-06-07 DIAGNOSIS — R188 Other ascites: Secondary | ICD-10-CM

## 2023-06-10 ENCOUNTER — Ambulatory Visit: Payer: 59

## 2023-06-23 ENCOUNTER — Ambulatory Visit
Admission: RE | Admit: 2023-06-23 | Discharge: 2023-06-23 | Disposition: A | Discharge status: Home / Self Care | Payer: 59 | Source: Ambulatory Visit | Attending: Internal Medicine | Admitting: Internal Medicine

## 2023-06-23 DIAGNOSIS — N186 End stage renal disease: Secondary | ICD-10-CM | POA: Insufficient documentation

## 2023-06-23 DIAGNOSIS — R188 Other ascites: Secondary | ICD-10-CM | POA: Insufficient documentation

## 2023-06-23 MED ORDER — LIDOCAINE HCL (PF) 1 % IJ SOLN
10.0000 mL | Freq: Once | INTRAMUSCULAR | Status: AC
  Administered 2023-06-23: 10 mL via INTRADERMAL
  Filled 2023-06-23: qty 10

## 2023-06-23 MED ORDER — ALBUMIN HUMAN 25 % IV SOLN
25.0000 g | Freq: Once | INTRAVENOUS | Status: AC
  Administered 2023-06-23: 25 g via INTRAVENOUS

## 2023-06-23 MED ORDER — ALBUMIN HUMAN 25 % IV SOLN
INTRAVENOUS | Status: AC
  Filled 2023-06-23: qty 100

## 2023-06-23 NOTE — Procedures (Signed)
 PROCEDURE SUMMARY:  Successful US guided paracentesis from left lower abdomen.  Yielded 3.7L of clear, yellow fluid.  No immediate complications.  Pt tolerated well.   No labs ordered.  EBL < 5mL  Strother Everitt N Temiloluwa Laredo PA-C 06/23/2023 2:11 PM

## 2023-06-24 ENCOUNTER — Ambulatory Visit: Payer: 59

## 2023-06-29 ENCOUNTER — Other Ambulatory Visit: Payer: Self-pay | Admitting: Nephrology

## 2023-06-29 DIAGNOSIS — R051 Acute cough: Secondary | ICD-10-CM

## 2023-07-06 ENCOUNTER — Ambulatory Visit
Admission: RE | Admit: 2023-07-06 | Discharge: 2023-07-06 | Disposition: A | Discharge status: Home / Self Care | Payer: 59 | Source: Ambulatory Visit | Attending: Internal Medicine | Admitting: Internal Medicine

## 2023-07-06 ENCOUNTER — Other Ambulatory Visit: Payer: Self-pay | Admitting: Internal Medicine

## 2023-07-06 DIAGNOSIS — Z21 Asymptomatic human immunodeficiency virus [HIV] infection status: Secondary | ICD-10-CM | POA: Diagnosis not present

## 2023-07-06 DIAGNOSIS — R188 Other ascites: Secondary | ICD-10-CM | POA: Insufficient documentation

## 2023-07-06 DIAGNOSIS — I5022 Chronic systolic (congestive) heart failure: Secondary | ICD-10-CM | POA: Diagnosis not present

## 2023-07-06 DIAGNOSIS — I132 Hypertensive heart and chronic kidney disease with heart failure and with stage 5 chronic kidney disease, or end stage renal disease: Secondary | ICD-10-CM | POA: Diagnosis not present

## 2023-07-06 DIAGNOSIS — N186 End stage renal disease: Secondary | ICD-10-CM | POA: Diagnosis not present

## 2023-07-06 DIAGNOSIS — J449 Chronic obstructive pulmonary disease, unspecified: Secondary | ICD-10-CM | POA: Insufficient documentation

## 2023-07-06 MED ORDER — ALBUMIN HUMAN 25 % IV SOLN
INTRAVENOUS | Status: AC
  Filled 2023-07-06: qty 100

## 2023-07-06 MED ORDER — LIDOCAINE HCL (PF) 1 % IJ SOLN
5.0000 mL | Freq: Once | INTRAMUSCULAR | Status: AC
  Administered 2023-07-06: 5 mL via INTRADERMAL
  Filled 2023-07-06: qty 5

## 2023-07-06 MED ORDER — ALBUMIN HUMAN 25 % IV SOLN
25.0000 g | Freq: Once | INTRAVENOUS | Status: AC
  Administered 2023-07-06: 25 g via INTRAVENOUS

## 2023-07-06 MED ORDER — ACETAMINOPHEN 500 MG PO TABS
ORAL_TABLET | ORAL | Status: AC
  Filled 2023-07-06: qty 1

## 2023-07-06 MED ORDER — ACETAMINOPHEN 500 MG PO TABS
500.0000 mg | ORAL_TABLET | Freq: Once | ORAL | Status: AC
  Administered 2023-07-06: 500 mg via ORAL

## 2023-07-06 MED ORDER — ALBUMIN HUMAN 25 % IV SOLN: 12.5000 g | Freq: Once | INTRAVENOUS | Status: DC

## 2023-07-06 NOTE — Progress Notes (Signed)
Patient reported abdominal cramping post paracentesis. Patient reports this is a common feeling she experiences after the procedure. Patient will take tylenol and it resolves. Patient's vital signs are stable. Patient does not have tenderness at procedure site. There is no evidence of bleeding or further abdominal distension. Denies: vomiting, nausea, chest pain and/or shortness of breath. 500 mg tylenol ordered for cramping.

## 2023-07-06 NOTE — Procedures (Signed)
PROCEDURE SUMMARY:  Successful US guided paracentesis from LLQ.  Yielded 4.4L of amber fluid.  No immediate complications.  Pt tolerated well.   No labs.   EBL < 5mL  Jesilyn Easom N Zelma Mazariego PA-C 07/06/2023 10:10 AM

## 2023-07-21 ENCOUNTER — Other Ambulatory Visit (INDEPENDENT_AMBULATORY_CARE_PROVIDER_SITE_OTHER): Payer: Self-pay | Admitting: Vascular Surgery

## 2023-07-21 DIAGNOSIS — N186 End stage renal disease: Secondary | ICD-10-CM

## 2023-07-21 NOTE — Progress Notes (Signed)
 MRN : [id number]  Rebecca Bridges is a 48 y.o. ([birth year]) female who presents with chief complaint of check access.  History of Present Illness:    The patient is seen for evaluation of dialysis access.  The patient has a history of multiple failed accesses.  There have been accesses in both arms which are nonfunctioning.    Procedure 02/12/2023:   revision / new left radio-cephalic fistula   Current access is via a catheter which is functioning poorly the flow rates have been less than ideal.  There have not been multiple episodes of catheter infection.  The patient denies fever and chills while on dialysis.  No tenderness or drainage at the exit site.  They have tried to use her fistula on the left arm but have infiltrated her twice.  No recent shortening of the patient's walking distance or new symptoms consistent with claudication.  No history of rest pain symptoms. No new ulcers or wounds of the lower extremities have occurred.  The patient denies amaurosis fugax or recent TIA symptoms. There are no recent neurological changes noted. There is no history of DVT, PE or superficial thrombophlebitis. No recent episodes of angina or shortness of breath documented.   No outpatient medications have been marked as taking for the 07/22/23 encounter (Appointment) with Jama, Cordella MATSU, MD.    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   a.) occluded anterior tibial, tibioperoneal trunk, and posterior tibial arteries --> thromboembolectomy and PTA of the anterior tibial artery   CAD (coronary artery disease) 02/15/2019   a.) R/LHC 02/15/2019: mild (non-obstructive) diffuse LAD and LCx disease   COPD (chronic obstructive pulmonary disease) (HCC)    COVID    Dilated cardiomyopathy (HCC)    a.) TTE 11/27/2013: EF 20%; b.) TTE 03/15/2015: EF 20%; c.) TTE 12/09/2015: EF 45%; d.) TTE 12/22/2016: EF 35%; e.) TTE  08/08/2017: EF 45-50%; f.) TTE 01/27/2019: EF 20-25%; g.) TTE 01/13/2021: EF <15%; h.) TTE 11/21/2021: EF <15%   ESRD (end stage renal disease) on dialysis (HCC)    a.) T-Th-Sat   GERD (gastroesophageal reflux disease)    Hepatitis C virus    HFrEF (heart failure with reduced ejection fraction) (HCC)    a.) TTE 11/27/2013: EF 20%; b.) TTE 03/15/2015: EF 20%; c.) TTE 12/09/2015: EF 45%; d.) TTE 12/22/2016: EF 35%, G1DD; e.) TTE 08/08/2017: EF 45-50%, G1DD; f.) TTE 01/27/2019: EF 20-25%, G1DD; g.) R/LHC 02/15/2019: mPA 25, mPCWP 13, CO 4.84, CI 3.15; h.) TTE 01/13/2021: EF <15%, sev glob HK, sep/apical AK, mod asym LVH, sev LAE, mild RAE, G2DD; I.) TTE 11/21/2021: EF <15%, G2DD   History of noncompliance with medical treatment    HIV (human immunodeficiency virus infection) (HCC) 2015   a.) on bictegravir-emtricitabine -tenofovir  (Biktarvy )   Hypertension    Insomnia    a.) on Trazodone  PRN   LBBB (left bundle branch block)    Liver disease, chronic, with cirrhosis (HCC)    Long term current use of anticoagulant    a.) apixaban    Pancytopenia (HCC)    Prolonged Q-T interval on ECG    Splenomegaly    Tobacco abuse     Past Surgical History:  Procedure Laterality Date   A/V FISTULAGRAM Left 03/31/2022   Procedure: A/V Fistulagram;  Surgeon: Jama Cordella MATSU, MD;  Location: [facility];  Service: Cardiovascular;  Laterality: Left;   AV FISTULA PLACEMENT Left 02/12/2023   Procedure: LEFT ARM FISTULA CREATION;  Surgeon: Serene Gaile ORN, MD;  Location: [facility] OR;  Service: Vascular;  Laterality: Left;   CHOLECYSTECTOMY     LOWER EXTREMITY ANGIOGRAPHY Left 01/25/2019   Procedure: Lower Extremity Angiography;  Surgeon: Jama Cordella MATSU, MD;  Location: [facility];  Service: Cardiovascular;  Laterality: Left;   RIGHT/LEFT HEART CATH AND CORONARY ANGIOGRAPHY Bilateral 02/15/2019   Procedure: RIGHT/LEFT HEART CATH AND CORONARY ANGIOGRAPHY;  Surgeon: Perla Evalene PARAS, MD;  Location:  [facility];  Service: Cardiovascular;  Laterality: Bilateral;    Social History Social History   Tobacco Use   Smoking status: Every Day    Current packs/day: 0.25    Types: Cigarettes   Smokeless tobacco: Never   Tobacco comments:    5 cigarettes a day  Vaping Use   Vaping status: Never Used  Substance Use Topics   Alcohol use: No   Drug use: No    Family History Family History  Problem Relation Age of Onset   Hypertension Mother    Cerebral aneurysm Mother     Allergies  Allergen Reactions   Neosporin [Bacitracin-Polymyxin B] Rash   Sulfa Antibiotics Rash     REVIEW OF SYSTEMS (Negative unless checked)  Constitutional: [] Weight loss  [] Fever  [] Chills Cardiac: [] Chest pain   [] Chest pressure   [] Palpitations   [] Shortness of breath when laying flat   [] Shortness of breath with exertion. Vascular:  [] Pain in legs with walking   [] Pain in legs at rest  [] History of DVT   [] Phlebitis   [] Swelling in legs   [] Varicose veins   [] Non-healing ulcers Pulmonary:   [] Uses home oxygen   [] Productive cough   [] Hemoptysis   [] Wheeze  [] COPD   [] Asthma Neurologic:  [] Dizziness   [] Seizures   [] History of stroke   [] History of TIA  [] Aphasia   [] Vissual changes   [] Weakness or numbness in arm   [] Weakness or numbness in leg Musculoskeletal:   [] Joint swelling   [] Joint pain   [] Low back pain Hematologic:  [] Easy bruising  [] Easy bleeding   [] Hypercoagulable state   [] Anemic Gastrointestinal:  [] Diarrhea   [] Vomiting  [] Gastroesophageal reflux/heartburn   [] Difficulty swallowing. Genitourinary:  [x] Chronic kidney disease   [] Difficult urination  [] Frequent urination   [] Blood in urine Skin:  [] Rashes   [] Ulcers  Psychological:  [] History of anxiety   []  History of major depression.  Physical Examination  There were no vitals filed for this visit. There is no height or weight on file to calculate BMI. Gen: WD/WN, NAD Head: [place]/AT, No temporalis wasting.   Ear/Nose/Throat: Hearing grossly intact, nares w/o erythema or drainage Eyes: PER, EOMI, sclera nonicteric.  Neck: Supple, no gross masses or lesions.  No JVD.  Pulmonary:  Good air movement, no audible wheezing, no use of accessory muscles.  Cardiac: RRR, precordium non-hyperdynamic. Vascular:   The left revised radiocephalic fistula is quite prominent.  The vein measures 9 mm in diameter.  There is an excellent thrill and bruit Vessel Right Left  Radial Palpable Palpable  Brachial Palpable Palpable  Gastrointestinal: soft, non-distended. No guarding/no peritoneal signs.  Musculoskeletal: M/S 5/5 throughout.  No deformity.  Neurologic: CN 2-12 intact. Pain and light touch intact in extremities.  Symmetrical.  Speech is fluent. Motor exam as listed above. Psychiatric: Judgment intact, Mood & affect appropriate for pt's clinical situation. Dermatologic: No rashes or ulcers noted.  No changes consistent with cellulitis.   CBC Lab Results  Component Value Date   WBC 7.0 04/26/2023   HGB 9.0 (L) 04/26/2023   HCT 28.5 (L) 04/26/2023   MCV 98.6 04/26/2023   PLT 157 04/26/2023    BMET    Component Value Date/Time   NA 130 (L) 04/26/2023 0743   NA 132 (L) 03/06/2019 1418   K 3.9 04/26/2023 0743   CL 93 (L) 04/26/2023 0743   CO2 25 04/26/2023 0743   GLUCOSE 113 (H) 04/26/2023 0743   BUN 38 (H) 04/26/2023 0743   BUN 41 (H) 03/06/2019 1418   CREATININE 4.51 (H) 04/26/2023 0743   CALCIUM  8.0 (L) 04/26/2023 0743   GFRNONAA 12 (L) 04/26/2023 0743   GFRAA 16 (L) 06/13/2019 1150   CrCl cannot be calculated (Patient's most recent lab result is older than the maximum 21 days allowed.).  COAG Lab Results  Component Value Date   INR 1.5 (H) 10/04/2022   INR 1.6 (H) 03/10/2022   INR >10.0 (HH) 03/07/2022    Radiology US  Paracentesis Result Date: 07/06/2023 INDICATION: Patient is a 48 y/o female with history of COPD, ESRD, HIV, HTN, and HFrEF. Patient presents for a therapeutic  paracentesis due to recurrent ascites. EXAM: ULTRASOUND GUIDED left PARACENTESIS MEDICATIONS: 8mL lidocaine  1% COMPLICATIONS: None immediate. PROCEDURE: Informed written consent was obtained from the patient after a discussion of the risks, benefits and alternatives to treatment. A timeout was performed prior to the initiation of the procedure. Initial ultrasound scanning demonstrates a large amount of ascites within the left lower abdominal quadrant. The left lower abdomen was prepped and draped in the usual sterile fashion. 1% lidocaine  was used for local anesthesia. Following this, a 19 gauge, 10-cm, Yueh catheter was introduced. An ultrasound image was saved for documentation purposes. The paracentesis was performed. The catheter was removed and a dressing was applied. The patient tolerated the procedure well without immediate post procedural complication. Patient received post-procedure intravenous albumin ; see nursing notes for details. FINDINGS: A total of approximately 4.4L of amber fluid was removed. IMPRESSION: Successful ultrasound-guided paracentesis yielding 4.4L liters of peritoneal fluid. PLAN: Ordering provider did not order lab samples. Continue care as prescribed. Performed by Daved Hipp PA-C Electronically Signed   By: CHRISTELLA.  Shick M.D.   On: 07/06/2023 10:44   US  Paracentesis Result Date: 06/23/2023 INDICATION: Patient is a 48 y/o female with history of ESRD. Patient presents for a therapeutic paracentesis due to recurrent ascites. EXAM: ULTRASOUND GUIDED left sided PARACENTESIS MEDICATIONS: 10mL of lidocaine  1%. COMPLICATIONS: None immediate. PROCEDURE: Informed written consent was obtained from the patient after a discussion of the risks, benefits and alternatives to treatment. A timeout was performed prior to the initiation of the procedure. Initial ultrasound scanning demonstrates a large amount of ascites within the left lower abdominal quadrant. The left lower abdomen was prepped and  draped in the usual sterile fashion. 1% lidocaine  was used for local anesthesia. Following this, a 19 gauge, 7-cm, Yueh catheter was introduced. An ultrasound image was saved for documentation purposes. The paracentesis was performed. The catheter was removed and a dressing was applied. The patient tolerated the procedure well without immediate post procedural complication. Patient received post-procedure intravenous albumin ; see nursing notes for details. FINDINGS: A total of approximately 3.7L of clear, yellow fluid was removed. IMPRESSION: Successful ultrasound-guided paracentesis yielding 3.7 L liters of peritoneal fluid. PLAN: If the patient eventually requires >/=2 paracenteses in a 30 day period, candidacy for formal evaluation by the [facility]  Interventional Radiology [facility] will be assessed. Performed by Daved Hipp PA-C Electronically Signed   By: Wilkie Lent M.D.   On: 06/23/2023 15:13     Assessment/Plan 1. ESRD on dialysis [facility]) (Primary) Recommend:  The patient is doing well but currently does not have adequate dialysis access.  She is noting sluggish flow from her catheter and difficulty with 1 lm.  We will arrange for a tPA infusion to see if we can rectify this.    Flow pattern is improved status post revision when compared to the prior ultrasound.  It is difficult to understand how they can be having problems with access her forearm vein measures 9 mm in diameter which is 50% larger than the graft it is readily visible and easy to feel.  I will discuss this with Dr. Dennise her nephrologist  The patient should have a duplex ultrasound of the dialysis access in 3 months. The patient will follow-up with me in the office after each ultrasound   - VAS US  DUPLEX DIALYSIS ACCESS (AVF, AVG); Future  2. HTN (hypertension), malignant Continue antihypertensive medications as already ordered, these medications have been reviewed and there are no changes at this  time.  3. Chronic obstructive pulmonary disease, unspecified COPD type (HCC) Continue pulmonary medications and aerosols as already ordered, these medications have been reviewed and there are no changes at this time.     Cordella Shawl, MD  07/21/2023 7:51 AM

## 2023-07-22 ENCOUNTER — Encounter (INDEPENDENT_AMBULATORY_CARE_PROVIDER_SITE_OTHER): Payer: Self-pay | Admitting: Vascular Surgery

## 2023-07-22 ENCOUNTER — Ambulatory Visit (INDEPENDENT_AMBULATORY_CARE_PROVIDER_SITE_OTHER): Payer: 59 | Admitting: Vascular Surgery

## 2023-07-22 ENCOUNTER — Ambulatory Visit (INDEPENDENT_AMBULATORY_CARE_PROVIDER_SITE_OTHER): Payer: 59

## 2023-07-22 ENCOUNTER — Other Ambulatory Visit: Payer: Self-pay | Admitting: Internal Medicine

## 2023-07-22 VITALS — BP 123/79 | HR 93 | Resp 18 | Ht 63.0 in | Wt 109.5 lb

## 2023-07-22 DIAGNOSIS — J449 Chronic obstructive pulmonary disease, unspecified: Secondary | ICD-10-CM | POA: Diagnosis not present

## 2023-07-22 DIAGNOSIS — I10 Essential (primary) hypertension: Secondary | ICD-10-CM | POA: Diagnosis not present

## 2023-07-22 DIAGNOSIS — Z992 Dependence on renal dialysis: Secondary | ICD-10-CM

## 2023-07-22 DIAGNOSIS — N186 End stage renal disease: Secondary | ICD-10-CM

## 2023-07-22 DIAGNOSIS — R188 Other ascites: Secondary | ICD-10-CM

## 2023-07-23 ENCOUNTER — Ambulatory Visit
Admission: RE | Admit: 2023-07-23 | Discharge: 2023-07-23 | Disposition: A | Discharge status: Home / Self Care | Payer: 59 | Source: Ambulatory Visit | Attending: Internal Medicine | Admitting: Internal Medicine

## 2023-07-23 DIAGNOSIS — R188 Other ascites: Secondary | ICD-10-CM | POA: Insufficient documentation

## 2023-07-23 MED ORDER — LIDOCAINE HCL (PF) 1 % IJ SOLN
10.0000 mL | Freq: Once | INTRAMUSCULAR | Status: AC
  Administered 2023-07-23: 10 mL via INTRADERMAL
  Filled 2023-07-23: qty 10

## 2023-07-23 NOTE — Procedures (Signed)
 PROCEDURE SUMMARY:  Successful US  guided paracentesis from left lateral abdomen.  Yielded 3.0 liters of hazy, yellow fluid.  No immediate complications.  Patient tolerated well.  EBL = trace  Specimen not sent for labs.  Rebecca Bridges CHRISTELLA Bal PA-C 07/23/2023 11:04 AM

## 2023-07-24 ENCOUNTER — Encounter (INDEPENDENT_AMBULATORY_CARE_PROVIDER_SITE_OTHER): Payer: Self-pay | Admitting: Vascular Surgery

## 2023-08-12 ENCOUNTER — Other Ambulatory Visit: Payer: Self-pay | Admitting: Internal Medicine

## 2023-08-12 DIAGNOSIS — R188 Other ascites: Secondary | ICD-10-CM

## 2023-08-13 ENCOUNTER — Ambulatory Visit
Admission: RE | Admit: 2023-08-13 | Discharge: 2023-08-13 | Disposition: A | Discharge status: Home / Self Care | Payer: 59 | Source: Ambulatory Visit | Attending: Internal Medicine | Admitting: Internal Medicine

## 2023-08-13 DIAGNOSIS — N186 End stage renal disease: Secondary | ICD-10-CM | POA: Diagnosis not present

## 2023-08-13 DIAGNOSIS — R188 Other ascites: Secondary | ICD-10-CM | POA: Diagnosis present

## 2023-08-13 MED ORDER — ALBUMIN HUMAN 25 % IV SOLN: 25.0000 g | Freq: Once | INTRAVENOUS | Status: AC

## 2023-08-13 MED ORDER — ALBUMIN HUMAN 25 % IV SOLN
INTRAVENOUS | Status: AC
  Administered 2023-08-13: 25 g via INTRAVENOUS
  Filled 2023-08-13: qty 100

## 2023-08-13 MED ORDER — LIDOCAINE HCL (PF) 1 % IJ SOLN
10.0000 mL | Freq: Once | INTRAMUSCULAR | Status: AC
Start: 2023-08-13 — End: 2023-08-13
  Administered 2023-08-13: 10 mL via INTRADERMAL
  Filled 2023-08-13: qty 10

## 2023-08-13 NOTE — Procedures (Signed)
 PROCEDURE SUMMARY:  Successful US guided paracentesis from left lateral abdomen.  Yielded 3.7 liters of blood-tinged fluid.  No immediate complications.  Patient tolerated well.  EBL = trace  Specimen not sent for labs.  Ersa Delaney Charmian Muff PA-C 08/13/2023 10:54 AM

## 2023-09-02 ENCOUNTER — Other Ambulatory Visit: Payer: Self-pay | Admitting: Internal Medicine

## 2023-09-02 DIAGNOSIS — R188 Other ascites: Secondary | ICD-10-CM

## 2023-09-03 ENCOUNTER — Ambulatory Visit
Admission: RE | Admit: 2023-09-03 | Discharge: 2023-09-03 | Disposition: A | Discharge status: Home / Self Care | Source: Ambulatory Visit | Attending: Internal Medicine | Admitting: Internal Medicine

## 2023-09-03 DIAGNOSIS — Z992 Dependence on renal dialysis: Secondary | ICD-10-CM | POA: Insufficient documentation

## 2023-09-03 DIAGNOSIS — R188 Other ascites: Secondary | ICD-10-CM | POA: Diagnosis present

## 2023-09-03 DIAGNOSIS — N186 End stage renal disease: Secondary | ICD-10-CM | POA: Insufficient documentation

## 2023-09-03 MED ORDER — ALBUMIN HUMAN 25 % IV SOLN
INTRAVENOUS | Status: AC
  Filled 2023-09-03: qty 100

## 2023-09-03 MED ORDER — LIDOCAINE HCL (PF) 1 % IJ SOLN
10.0000 mL | Freq: Once | INTRAMUSCULAR | Status: DC
Start: 2023-09-03 — End: 2023-09-03
  Filled 2023-09-03: qty 10

## 2023-09-03 MED ORDER — LIDOCAINE HCL (PF) 1 % IJ SOLN
20.0000 mL | Freq: Once | INTRAMUSCULAR | Status: AC
  Administered 2023-09-03: 20 mL via INTRADERMAL
  Filled 2023-09-03: qty 20

## 2023-09-03 MED ORDER — ALBUMIN HUMAN 25 % IV SOLN
25.0000 g | Freq: Once | INTRAVENOUS | Status: AC
  Administered 2023-09-03: 25 g via INTRAVENOUS

## 2023-09-03 NOTE — Procedures (Signed)
 PROCEDURE SUMMARY:  Successful image-guided paracentesis from the left lower abdomen.  Yielded 3.6 liters of dark yellow fluid.  No immediate complications.  EBL: zero Patient tolerated well.   Specimen was not sent for labs.  Please see imaging section of Epic for full dictation.  Villa Herb PA-C 09/03/2023 11:50 AM

## 2023-09-14 ENCOUNTER — Other Ambulatory Visit: Payer: Self-pay | Admitting: Internal Medicine

## 2023-09-14 DIAGNOSIS — R188 Other ascites: Secondary | ICD-10-CM

## 2023-09-17 ENCOUNTER — Ambulatory Visit
Admission: RE | Admit: 2023-09-17 | Discharge: 2023-09-17 | Disposition: A | Discharge status: Home / Self Care | Source: Ambulatory Visit | Attending: Internal Medicine | Admitting: Internal Medicine

## 2023-10-05 ENCOUNTER — Ambulatory Visit
Admission: RE | Admit: 2023-10-05 | Discharge: 2023-10-05 | Disposition: A | Discharge status: Home / Self Care | Source: Ambulatory Visit | Attending: Internal Medicine | Admitting: Internal Medicine

## 2023-10-05 DIAGNOSIS — R188 Other ascites: Secondary | ICD-10-CM | POA: Diagnosis present

## 2023-10-05 DIAGNOSIS — Z992 Dependence on renal dialysis: Secondary | ICD-10-CM | POA: Diagnosis not present

## 2023-10-05 DIAGNOSIS — N186 End stage renal disease: Secondary | ICD-10-CM | POA: Insufficient documentation

## 2023-10-05 MED ORDER — ALBUMIN HUMAN 25 % IV SOLN
25.0000 g | Freq: Once | INTRAVENOUS | Status: AC
Start: 2023-10-05 — End: 2023-10-05
  Administered 2023-10-05: 25 g via INTRAVENOUS

## 2023-10-05 MED ORDER — LIDOCAINE HCL (PF) 1 % IJ SOLN
10.0000 mL | Freq: Once | INTRAMUSCULAR | Status: AC
  Administered 2023-10-05: 10 mL via INTRADERMAL
  Filled 2023-10-05: qty 10

## 2023-10-05 MED ORDER — ALBUMIN HUMAN 25 % IV SOLN
INTRAVENOUS | Status: AC
  Filled 2023-10-05: qty 100

## 2023-10-05 MED ORDER — ONDANSETRON 4 MG PO TBDP
4.0000 mg | ORAL_TABLET | Freq: Once | ORAL | Status: AC
  Administered 2023-10-05: 4 mg via ORAL

## 2023-10-05 MED ORDER — ONDANSETRON 4 MG PO TBDP
ORAL_TABLET | ORAL | Status: AC
Start: 2023-10-05 — End: ?
  Filled 2023-10-05: qty 1

## 2023-10-05 NOTE — Procedures (Signed)
 PROCEDURE SUMMARY:  Successful US  guided paracentesis from right lower quadrant.  Yielded 3.8 L of clear yellow fluid.  No immediate complications.  Pt tolerated well.   Specimen not sent for labs.  EBL < 2 mL  Fawn Hooks, NP 10/05/2023 10:14 AM

## 2023-10-19 ENCOUNTER — Other Ambulatory Visit: Payer: Self-pay | Admitting: Internal Medicine

## 2023-10-19 DIAGNOSIS — R188 Other ascites: Secondary | ICD-10-CM

## 2023-10-20 ENCOUNTER — Ambulatory Visit
Admission: RE | Admit: 2023-10-20 | Discharge: 2023-10-20 | Disposition: A | Discharge status: Home / Self Care | Source: Ambulatory Visit | Attending: Internal Medicine | Admitting: Internal Medicine

## 2023-10-20 DIAGNOSIS — R188 Other ascites: Secondary | ICD-10-CM | POA: Diagnosis present

## 2023-10-20 DIAGNOSIS — K7460 Unspecified cirrhosis of liver: Secondary | ICD-10-CM | POA: Insufficient documentation

## 2023-10-20 MED ORDER — LIDOCAINE HCL (PF) 1 % IJ SOLN
10.0000 mL | Freq: Once | INTRAMUSCULAR | Status: AC
  Administered 2023-10-20: 10 mL

## 2023-10-20 NOTE — Procedures (Signed)
 PROCEDURE SUMMARY:  Successful image-guided paracentesis from the left lower abdomen.  Yielded 2.6 liters of dark yellow fluid.  No immediate complications.  EBL: trace Patient tolerated well.   Specimen not sent for labs.  Please see imaging section of Epic for full dictation.  Damian Duke Chidera Thivierge PA-C 10/20/2023 2:00 PM

## 2023-10-21 ENCOUNTER — Encounter (INDEPENDENT_AMBULATORY_CARE_PROVIDER_SITE_OTHER): Payer: 59

## 2023-10-21 ENCOUNTER — Ambulatory Visit (INDEPENDENT_AMBULATORY_CARE_PROVIDER_SITE_OTHER): Payer: 59 | Admitting: Vascular Surgery

## 2023-11-01 ENCOUNTER — Other Ambulatory Visit: Payer: Self-pay | Admitting: Internal Medicine

## 2023-11-01 DIAGNOSIS — R188 Other ascites: Secondary | ICD-10-CM

## 2023-11-02 ENCOUNTER — Telehealth (INDEPENDENT_AMBULATORY_CARE_PROVIDER_SITE_OTHER): Payer: Self-pay

## 2023-11-02 ENCOUNTER — Ambulatory Visit
Admission: RE | Admit: 2023-11-02 | Discharge: 2023-11-02 | Disposition: A | Discharge status: Home / Self Care | Source: Ambulatory Visit | Attending: Internal Medicine | Admitting: Internal Medicine

## 2023-11-02 ENCOUNTER — Encounter (INDEPENDENT_AMBULATORY_CARE_PROVIDER_SITE_OTHER): Payer: Self-pay

## 2023-11-02 DIAGNOSIS — Z992 Dependence on renal dialysis: Secondary | ICD-10-CM | POA: Insufficient documentation

## 2023-11-02 DIAGNOSIS — R188 Other ascites: Secondary | ICD-10-CM | POA: Diagnosis not present

## 2023-11-02 DIAGNOSIS — N186 End stage renal disease: Secondary | ICD-10-CM | POA: Diagnosis present

## 2023-11-02 MED ORDER — LIDOCAINE HCL (PF) 1 % IJ SOLN
10.0000 mL | Freq: Once | INTRAMUSCULAR | Status: AC
  Administered 2023-11-02: 10 mL

## 2023-11-02 NOTE — Telephone Encounter (Signed)
 I spoke with pt and let her know that dialysis should reach out to let us  know that something is going on.  She states understanding.

## 2023-11-02 NOTE — Procedures (Signed)
 PROCEDURE SUMMARY:  Successful US  guided paracentesis from left lateral abdomen.  Yielded 3.3 liters of dark amber fluid.  No immediate complications.  Patient tolerated well.  EBL = trace   Aerie Donica Alonzo Artis PA-C 11/02/2023 12:32 PM

## 2023-11-02 NOTE — Telephone Encounter (Signed)
 She's got a bit of a complicated hx with her fistula, she can come in w/HDA to see GS

## 2023-11-02 NOTE — Telephone Encounter (Signed)
 That is fine

## 2023-11-18 ENCOUNTER — Other Ambulatory Visit (INDEPENDENT_AMBULATORY_CARE_PROVIDER_SITE_OTHER): Payer: Self-pay | Admitting: Vascular Surgery

## 2023-11-18 ENCOUNTER — Ambulatory Visit
Admission: RE | Admit: 2023-11-18 | Discharge: 2023-11-18 | Disposition: A | Discharge status: Home / Self Care | Source: Ambulatory Visit | Attending: Internal Medicine | Admitting: Internal Medicine

## 2023-11-18 ENCOUNTER — Other Ambulatory Visit: Payer: Self-pay | Admitting: Internal Medicine

## 2023-11-18 DIAGNOSIS — K7460 Unspecified cirrhosis of liver: Secondary | ICD-10-CM | POA: Insufficient documentation

## 2023-11-18 DIAGNOSIS — N186 End stage renal disease: Secondary | ICD-10-CM | POA: Diagnosis not present

## 2023-11-18 DIAGNOSIS — Z992 Dependence on renal dialysis: Secondary | ICD-10-CM | POA: Diagnosis not present

## 2023-11-18 DIAGNOSIS — R188 Other ascites: Secondary | ICD-10-CM | POA: Diagnosis present

## 2023-11-18 MED ORDER — LIDOCAINE HCL (PF) 1 % IJ SOLN
10.0000 mL | Freq: Once | INTRAMUSCULAR | Status: AC
  Administered 2023-11-18: 10 mL via INTRADERMAL
  Filled 2023-11-18: qty 10

## 2023-11-18 NOTE — Procedures (Signed)
 PROCEDURE SUMMARY:  Successful US  guided paracentesis from left lateral abdomen.  Yielded 2.6 liters of dark yellow fluid.  No immediate complications.  Pt tolerated well.   Specimen was not sent for labs.  EBL < 5mL  Lillian Rein PA-C 11/18/2023 1:08 PM

## 2023-11-22 ENCOUNTER — Encounter (INDEPENDENT_AMBULATORY_CARE_PROVIDER_SITE_OTHER)

## 2023-11-22 ENCOUNTER — Ambulatory Visit (INDEPENDENT_AMBULATORY_CARE_PROVIDER_SITE_OTHER): Admitting: Vascular Surgery

## 2023-11-30 ENCOUNTER — Other Ambulatory Visit: Payer: Self-pay | Admitting: Internal Medicine

## 2023-11-30 ENCOUNTER — Ambulatory Visit
Admission: RE | Admit: 2023-11-30 | Discharge: 2023-11-30 | Disposition: A | Discharge status: Home / Self Care | Source: Ambulatory Visit | Attending: Internal Medicine | Admitting: Internal Medicine

## 2023-11-30 DIAGNOSIS — R059 Cough, unspecified: Secondary | ICD-10-CM | POA: Diagnosis present

## 2023-11-30 DIAGNOSIS — R062 Wheezing: Secondary | ICD-10-CM | POA: Diagnosis present

## 2023-11-30 DIAGNOSIS — R0602 Shortness of breath: Secondary | ICD-10-CM | POA: Diagnosis present

## 2023-12-03 ENCOUNTER — Other Ambulatory Visit: Payer: Self-pay | Admitting: Internal Medicine

## 2023-12-03 ENCOUNTER — Ambulatory Visit
Admission: RE | Admit: 2023-12-03 | Discharge: 2023-12-03 | Disposition: A | Discharge status: Home / Self Care | Source: Ambulatory Visit | Attending: Internal Medicine | Admitting: Internal Medicine

## 2023-12-03 DIAGNOSIS — K7460 Unspecified cirrhosis of liver: Secondary | ICD-10-CM | POA: Insufficient documentation

## 2023-12-03 DIAGNOSIS — R188 Other ascites: Secondary | ICD-10-CM

## 2023-12-03 MED ORDER — LIDOCAINE HCL (PF) 1 % IJ SOLN
10.0000 mL | Freq: Once | INTRAMUSCULAR | Status: AC
  Administered 2023-12-03: 10 mL via INTRADERMAL
  Filled 2023-12-03: qty 10

## 2023-12-03 NOTE — Procedures (Signed)
 PROCEDURE SUMMARY:  Successful image-guided paracentesis from the left abdomen.  Yielded 2.5 liters of clear, straw-colored peritoneal fluid.  No immediate complications.  EBL: zero Patient tolerated well.   Please see imaging section of Epic for full dictation.  Gordy Lauber Ike Maragh PA-C 12/03/2023 2:13 PM

## 2023-12-14 ENCOUNTER — Other Ambulatory Visit: Payer: Self-pay | Admitting: Internal Medicine

## 2023-12-14 DIAGNOSIS — R188 Other ascites: Secondary | ICD-10-CM

## 2023-12-15 ENCOUNTER — Ambulatory Visit
Admission: RE | Admit: 2023-12-15 | Discharge: 2023-12-15 | Disposition: A | Discharge status: Home / Self Care | Source: Ambulatory Visit | Attending: Internal Medicine | Admitting: Internal Medicine

## 2023-12-15 DIAGNOSIS — R188 Other ascites: Secondary | ICD-10-CM | POA: Insufficient documentation

## 2023-12-15 MED ORDER — LIDOCAINE HCL (PF) 1 % IJ SOLN
10.0000 mL | Freq: Once | INTRAMUSCULAR | Status: AC
  Administered 2023-12-15: 10 mL via INTRADERMAL
  Filled 2023-12-15: qty 10

## 2023-12-15 NOTE — Procedures (Signed)
 PROCEDURE SUMMARY:  Successful US  guided paracentesis from left lateral abdomen.  Yielded 2.5 L clear, yellow fluid.  No immediate complications.  Pt tolerated well.   Specimen was not sent for labs.  EBL < 5mL  Solmon Selmer Ku PA-C 12/15/2023 2:05 PM

## 2023-12-21 ENCOUNTER — Other Ambulatory Visit: Payer: Self-pay | Admitting: Internal Medicine

## 2023-12-21 ENCOUNTER — Other Ambulatory Visit: Payer: Self-pay | Admitting: Radiology

## 2023-12-21 ENCOUNTER — Ambulatory Visit
Admission: RE | Admit: 2023-12-21 | Discharge: 2023-12-21 | Disposition: A | Discharge status: Home / Self Care | Source: Ambulatory Visit | Attending: Internal Medicine | Admitting: Internal Medicine

## 2023-12-21 ENCOUNTER — Other Ambulatory Visit: Payer: Self-pay | Admitting: Nephrology

## 2023-12-21 ENCOUNTER — Ambulatory Visit

## 2023-12-21 DIAGNOSIS — R1032 Left lower quadrant pain: Secondary | ICD-10-CM | POA: Diagnosis present

## 2023-12-21 DIAGNOSIS — R188 Other ascites: Secondary | ICD-10-CM | POA: Insufficient documentation

## 2023-12-21 DIAGNOSIS — D631 Anemia in chronic kidney disease: Secondary | ICD-10-CM

## 2023-12-21 DIAGNOSIS — N186 End stage renal disease: Secondary | ICD-10-CM

## 2023-12-21 LAB — HEMOGLOBIN AND HEMATOCRIT, BLOOD
HCT: 31.4 % — ABNORMAL LOW (ref 36.0–46.0)
Hemoglobin: 9.7 g/dL — ABNORMAL LOW (ref 12.0–15.0)

## 2023-12-21 MED ORDER — OXYCODONE HCL 5 MG PO TABS: 5.0000 mg | ORAL_TABLET | Freq: Four times a day (QID) | ORAL | 0 refills | Status: AC | PRN

## 2023-12-21 NOTE — Progress Notes (Addendum)
 Brief Interventional Radiology Note:  48 year old female with a history of ESRD on dialysis, cirrhosis, and recurrent ascites who is known to IR for regular paracentesis presented to US  for stat add on paracentesis with labs. Per patient, provider was concerned for infection given her worsening abdominal swelling and tenderness.   Patient states since her last paracentesis on 7/2, which was done on the left lateral abdomen, she has had increasing abdominal swelling with associated pain on her left side. States that most of her pain is below the previous puncture site. She denies abdominal pain elsewhere. Patient admits to one episode of lightheadedness following her dialysis appointment  this morning and mild dyspnea on exertion. She denies any syncopal episodes, hematemesis, or melena.   On exam, right abdomen is noted to be soft and non-tender while left abdomen is firm and tender to palpation. There are no signs of bruising or discoloration to her abdomen. VSS. US  exam concerning for possible hematoma of her LLQ. Dr. Jenna called in to room for further evaluation.   Stat CT A/P ordered by Dr. VEAR Stank; however, given delay in insurance authorization this will likely not happen today. H/H checked to rule out any acute drop. Levels are stable at 9.7/31.4. Patient informed of these results at the bedside. Discussed with her should her insurance approve the CT and she wants to have it done in the next day or two, we will be happy to work her in to the schedule. Should she start to experience any new or worsening pain to her abdomen, dizziness, shortness of breath, palpitations, or blood in her stool she needs to proceed to the nearest ED. Patient verbalized understanding.   She can otherwise continue to follow up with Nephrology and return for her paracentesis as needed. PRN oxycodone  prescribed for pain relief as needed.   Recommend that her next few paracentesis be completed on her right abdomen until  the hematoma fully heals. Patient and US  team aware of this.   Electronically Signed: Alithia Zavaleta M Ellizabeth Dacruz, PA-C 12/21/2023, 3:44 PM

## 2023-12-22 ENCOUNTER — Ambulatory Visit
Admission: RE | Admit: 2023-12-22 | Discharge: 2023-12-22 | Disposition: A | Discharge status: Home / Self Care | Source: Ambulatory Visit | Attending: Nephrology | Admitting: Nephrology

## 2023-12-22 DIAGNOSIS — R188 Other ascites: Secondary | ICD-10-CM | POA: Insufficient documentation

## 2023-12-22 DIAGNOSIS — N186 End stage renal disease: Secondary | ICD-10-CM | POA: Insufficient documentation

## 2023-12-22 DIAGNOSIS — R1032 Left lower quadrant pain: Secondary | ICD-10-CM | POA: Insufficient documentation

## 2023-12-22 DIAGNOSIS — Z992 Dependence on renal dialysis: Secondary | ICD-10-CM | POA: Diagnosis present

## 2023-12-22 MED ORDER — IOHEXOL 300 MG/ML  SOLN
100.0000 mL | Freq: Once | INTRAMUSCULAR | Status: AC | PRN
  Administered 2023-12-22: 100 mL via INTRAVENOUS

## 2023-12-29 ENCOUNTER — Other Ambulatory Visit: Payer: Self-pay | Admitting: Internal Medicine

## 2023-12-29 DIAGNOSIS — R188 Other ascites: Secondary | ICD-10-CM

## 2023-12-31 ENCOUNTER — Ambulatory Visit
Admission: RE | Admit: 2023-12-31 | Discharge: 2023-12-31 | Disposition: A | Discharge status: Home / Self Care | Source: Ambulatory Visit | Attending: Internal Medicine | Admitting: Internal Medicine

## 2023-12-31 DIAGNOSIS — K7460 Unspecified cirrhosis of liver: Secondary | ICD-10-CM | POA: Insufficient documentation

## 2023-12-31 DIAGNOSIS — R188 Other ascites: Secondary | ICD-10-CM | POA: Insufficient documentation

## 2023-12-31 DIAGNOSIS — Z992 Dependence on renal dialysis: Secondary | ICD-10-CM | POA: Diagnosis not present

## 2023-12-31 DIAGNOSIS — N186 End stage renal disease: Secondary | ICD-10-CM | POA: Diagnosis not present

## 2023-12-31 NOTE — Procedures (Signed)
 PROCEDURE SUMMARY:  Successful image-guided paracentesis from the left abdomen.  Yielded 3.2 liters of clear, straw-colored peritoneal fluid.  No immediate complications.  EBL: zero Patient tolerated well.   Please see imaging section of Epic for full dictation.  Meili Kleckley A Abem Shaddix PA-C 12/31/2023 2:00 PM

## 2024-01-12 ENCOUNTER — Other Ambulatory Visit: Payer: Self-pay | Admitting: Internal Medicine

## 2024-01-12 DIAGNOSIS — R188 Other ascites: Secondary | ICD-10-CM

## 2024-01-14 ENCOUNTER — Ambulatory Visit

## 2024-01-18 ENCOUNTER — Telehealth (INDEPENDENT_AMBULATORY_CARE_PROVIDER_SITE_OTHER): Payer: Self-pay

## 2024-01-18 ENCOUNTER — Ambulatory Visit

## 2024-01-18 NOTE — Telephone Encounter (Signed)
 Spoke with the patient and she is scheduled for a permcath exchange on 0806/26 with a 12:30 pm arrival time to the [facility]. Pre-procedure instructions were discussed and will be sent to Mychart.

## 2024-01-19 ENCOUNTER — Other Ambulatory Visit: Payer: Self-pay

## 2024-01-19 ENCOUNTER — Encounter
Admission: RE | Disposition: A | Discharge status: Home / Self Care | Payer: Self-pay | Source: Home / Self Care | Attending: Vascular Surgery

## 2024-01-19 ENCOUNTER — Encounter: Payer: Self-pay | Admitting: Vascular Surgery

## 2024-01-19 ENCOUNTER — Ambulatory Visit
Admission: RE | Admit: 2024-01-19 | Discharge: 2024-01-19 | Disposition: A | Discharge status: Home / Self Care | Attending: Vascular Surgery | Admitting: Vascular Surgery

## 2024-01-19 ENCOUNTER — Ambulatory Visit
Admission: RE | Admit: 2024-01-19 | Discharge: 2024-01-19 | Disposition: A | Discharge status: Home / Self Care | Source: Ambulatory Visit | Attending: Internal Medicine | Admitting: Internal Medicine

## 2024-01-19 DIAGNOSIS — I2510 Atherosclerotic heart disease of native coronary artery without angina pectoris: Secondary | ICD-10-CM | POA: Insufficient documentation

## 2024-01-19 DIAGNOSIS — Z992 Dependence on renal dialysis: Secondary | ICD-10-CM | POA: Insufficient documentation

## 2024-01-19 DIAGNOSIS — N186 End stage renal disease: Secondary | ICD-10-CM | POA: Insufficient documentation

## 2024-01-19 DIAGNOSIS — I132 Hypertensive heart and chronic kidney disease with heart failure and with stage 5 chronic kidney disease, or end stage renal disease: Secondary | ICD-10-CM | POA: Diagnosis not present

## 2024-01-19 DIAGNOSIS — Z4901 Encounter for fitting and adjustment of extracorporeal dialysis catheter: Secondary | ICD-10-CM | POA: Insufficient documentation

## 2024-01-19 DIAGNOSIS — F17210 Nicotine dependence, cigarettes, uncomplicated: Secondary | ICD-10-CM | POA: Diagnosis not present

## 2024-01-19 DIAGNOSIS — I420 Dilated cardiomyopathy: Secondary | ICD-10-CM | POA: Insufficient documentation

## 2024-01-19 DIAGNOSIS — K7460 Unspecified cirrhosis of liver: Secondary | ICD-10-CM | POA: Insufficient documentation

## 2024-01-19 DIAGNOSIS — T8249XA Other complication of vascular dialysis catheter, initial encounter: Secondary | ICD-10-CM | POA: Diagnosis not present

## 2024-01-19 DIAGNOSIS — R188 Other ascites: Secondary | ICD-10-CM | POA: Insufficient documentation

## 2024-01-19 DIAGNOSIS — I5022 Chronic systolic (congestive) heart failure: Secondary | ICD-10-CM | POA: Diagnosis not present

## 2024-01-19 HISTORY — PX: DIALYSIS/PERMA CATHETER INSERTION: CATH118288

## 2024-01-19 LAB — POTASSIUM (ARMC VASCULAR LAB ONLY): Potassium (ARMC vascular lab): 5.1 mmol/L (ref 3.5–5.1)

## 2024-01-19 SURGERY — DIALYSIS/PERMA CATHETER INSERTION
Anesthesia: Moderate Sedation

## 2024-01-19 MED ORDER — METHYLPREDNISOLONE SODIUM SUCC 125 MG IJ SOLR: 125.0000 mg | Freq: Once | INTRAMUSCULAR | Status: DC | PRN

## 2024-01-19 MED ORDER — HEPARIN SODIUM (PORCINE) 10000 UNIT/ML IJ SOLN
INTRAMUSCULAR | Status: DC | PRN
  Administered 2024-01-19: 10000 [IU]

## 2024-01-19 MED ORDER — MIDAZOLAM HCL 2 MG/2ML IJ SOLN
INTRAMUSCULAR | Status: DC | PRN
  Administered 2024-01-19 (×2): 1 mg via INTRAVENOUS

## 2024-01-19 MED ORDER — FENTANYL CITRATE (PF) 100 MCG/2ML IJ SOLN
INTRAMUSCULAR | Status: DC | PRN
  Administered 2024-01-19 (×2): 50 ug via INTRAVENOUS

## 2024-01-19 MED ORDER — MIDAZOLAM HCL 2 MG/ML PO SYRP: 8.0000 mg | ORAL_SOLUTION | Freq: Once | ORAL | Status: DC | PRN

## 2024-01-19 MED ORDER — CEFAZOLIN SODIUM-DEXTROSE 1-4 GM/50ML-% IV SOLN
INTRAVENOUS | Status: AC
  Filled 2024-01-19: qty 50

## 2024-01-19 MED ORDER — FAMOTIDINE 20 MG PO TABS: 40.0000 mg | ORAL_TABLET | Freq: Once | ORAL | Status: DC | PRN

## 2024-01-19 MED ORDER — HEPARIN (PORCINE) IN NACL 1000-0.9 UT/500ML-% IV SOLN
INTRAVENOUS | Status: DC | PRN
  Administered 2024-01-19: 500 mL

## 2024-01-19 MED ORDER — DIPHENHYDRAMINE HCL 50 MG/ML IJ SOLN: 50.0000 mg | Freq: Once | INTRAMUSCULAR | Status: DC | PRN

## 2024-01-19 MED ORDER — MIDAZOLAM HCL 2 MG/2ML IJ SOLN
INTRAMUSCULAR | Status: AC
  Filled 2024-01-19: qty 2

## 2024-01-19 MED ORDER — CEFAZOLIN SODIUM-DEXTROSE 1-4 GM/50ML-% IV SOLN
1.0000 g | INTRAVENOUS | Status: AC
  Administered 2024-01-19: 1 g via INTRAVENOUS

## 2024-01-19 MED ORDER — LIDOCAINE-EPINEPHRINE (PF) 1 %-1:200000 IJ SOLN
INTRAMUSCULAR | Status: DC | PRN
  Administered 2024-01-19: 10 mL

## 2024-01-19 MED ORDER — HYDROMORPHONE HCL 1 MG/ML IJ SOLN: 1.0000 mg | Freq: Once | INTRAMUSCULAR | Status: DC | PRN

## 2024-01-19 MED ORDER — SODIUM CHLORIDE 0.9 % IV SOLN: INTRAVENOUS | Status: DC

## 2024-01-19 MED ORDER — FENTANYL CITRATE (PF) 100 MCG/2ML IJ SOLN
INTRAMUSCULAR | Status: AC
Start: 2024-01-19 — End: 2024-01-19
  Filled 2024-01-19: qty 2

## 2024-01-19 MED ORDER — LIDOCAINE HCL (PF) 1 % IJ SOLN
8.0000 mL | Freq: Once | INTRAMUSCULAR | Status: AC
  Administered 2024-01-19: 8 mL via INTRADERMAL
  Filled 2024-01-19: qty 8

## 2024-01-19 SURGICAL SUPPLY — 5 items
CATH PALINDROME-P 19CM W/VT (CATHETERS) IMPLANT
DERMABOND ADVANCED .7 DNX12 (GAUZE/BANDAGES/DRESSINGS) IMPLANT
GUIDEWIRE SUPER STIFF .035X180 (WIRE) IMPLANT
PACK ANGIOGRAPHY (CUSTOM PROCEDURE TRAY) ×1 IMPLANT
SUT PROLENE 0 CT 1 30 (SUTURE) IMPLANT

## 2024-01-19 NOTE — Procedures (Addendum)
 PROCEDURE SUMMARY:  Successful image-guided paracentesis from the left abdomen.  Yielded 3.2 liters of clear, dark amber vs. blood-tinged fluid.  No immediate complications.  EBL: zero Patient tolerated well.   Please see imaging section of Epic for full dictation.  Carlin LABOR [name] PA-C 01/19/2024 10:52 AM

## 2024-01-19 NOTE — H&P (Addendum)
 [facility] Admission History & Physical  MRN : [id number]  Rebecca Bridges is a 48 y.o. (02-10-1976) female who presents with chief complaint of No chief complaint on file. SABRA  History of Present Illness: Patient presents today for PermCath exchange.  Current PermCath is not functioning well.  Current PermCath is not functioning well.  End-stage renal disease present.  Has had recurrent paracentesis for ascites with her cirrhosis.  A litany of medical issues as below.  Current Facility-Administered Medications  Medication Dose Route Frequency Provider Last Rate Last Admin   0.9 %  sodium chloride  infusion   Intravenous Continuous Delores Orvin BRAVO, NP 10 mL/hr at 01/19/24 1312 New Bag at 01/19/24 1312   ceFAZolin  (ANCEF ) IVPB 1 g/50 mL premix  1 g Intravenous 30 min Pre-Op Brown, Fallon E, NP       diphenhydrAMINE  (BENADRYL ) injection 50 mg  50 mg Intravenous Once PRN Brown, Fallon E, NP       famotidine  (PEPCID ) tablet 40 mg  40 mg Oral Once PRN Brown, Fallon E, NP       HYDROmorphone  (DILAUDID ) injection 1 mg  1 mg Intravenous Once PRN Brown, Fallon E, NP       methylPREDNISolone  sodium succinate (SOLU-MEDROL ) 125 mg/2 mL injection 125 mg  125 mg Intravenous Once PRN Brown, Fallon E, NP       midazolam  (VERSED ) 2 MG/ML syrup 8 mg  8 mg Oral Once PRN Delores Orvin BRAVO, NP        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   a.) occluded anterior tibial, tibioperoneal trunk, and posterior tibial arteries --> thromboembolectomy and PTA of the anterior tibial artery   CAD (coronary artery disease) 02/15/2019   a.) R/LHC 02/15/2019: mild (non-obstructive) diffuse LAD and LCx disease   COPD (chronic obstructive pulmonary disease) (HCC)    COVID    Dilated cardiomyopathy (HCC)    a.) TTE 11/27/2013: EF 20%; b.) TTE 03/15/2015: EF 20%; c.) TTE 12/09/2015: EF 45%; d.) TTE 12/22/2016: EF 35%; e.) TTE 08/08/2017: EF 45-50%;  f.) TTE 01/27/2019: EF 20-25%; g.) TTE 01/13/2021: EF <15%; h.) TTE 11/21/2021: EF <15%   ESRD (end stage renal disease) on dialysis (HCC)    a.) T-Th-Sat   GERD (gastroesophageal reflux disease)    Hepatitis C virus    HFrEF (heart failure with reduced ejection fraction) (HCC)    a.) TTE 11/27/2013: EF 20%; b.) TTE 03/15/2015: EF 20%; c.) TTE 12/09/2015: EF 45%; d.) TTE 12/22/2016: EF 35%, G1DD; e.) TTE 08/08/2017: EF 45-50%, G1DD; f.) TTE 01/27/2019: EF 20-25%, G1DD; g.) R/LHC 02/15/2019: mPA 25, mPCWP 13, CO 4.84, CI 3.15; h.) TTE 01/13/2021: EF <15%, sev glob HK, sep/apical AK, mod asym LVH, sev LAE, mild RAE, G2DD; I.) TTE 11/21/2021: EF <15%, G2DD   History of noncompliance with medical treatment    HIV (human immunodeficiency virus infection) (HCC) 2015   a.) on bictegravir-emtricitabine -tenofovir  (Biktarvy )   Hypertension    Insomnia    a.) on Trazodone  PRN   LBBB (left bundle branch block)    Liver disease, chronic, with cirrhosis (HCC)    Long term current use of anticoagulant    a.) apixaban    Pancytopenia (HCC)    Prolonged Q-T interval on ECG    Splenomegaly    Tobacco abuse     Past Surgical History:  Procedure Laterality Date   A/V FISTULAGRAM Left 03/31/2022   Procedure: A/V Fistulagram;  Surgeon:  Schnier, Cordella MATSU, MD;  Location: [facility] INVASIVE CV LAB;  Service: Cardiovascular;  Laterality: Left;   AV FISTULA PLACEMENT Left 02/12/2023   Procedure: LEFT ARM FISTULA CREATION;  Surgeon: Serene Gaile ORN, MD;  Location: [facility] OR;  Service: Vascular;  Laterality: Left;   CHOLECYSTECTOMY     LOWER EXTREMITY ANGIOGRAPHY Left 01/25/2019   Procedure: Lower Extremity Angiography;  Surgeon: Jama Cordella MATSU, MD;  Location: [facility] INVASIVE CV LAB;  Service: Cardiovascular;  Laterality: Left;   RIGHT/LEFT HEART CATH AND CORONARY ANGIOGRAPHY Bilateral 02/15/2019   Procedure: RIGHT/LEFT HEART CATH AND CORONARY ANGIOGRAPHY;  Surgeon: Perla Evalene PARAS, MD;  Location: [facility] INVASIVE CV LAB;   Service: Cardiovascular;  Laterality: Bilateral;     Social History   Tobacco Use   Smoking status: Every Day    Current packs/day: 0.25    Types: Cigarettes   Smokeless tobacco: Never   Tobacco comments:    5 cigarettes a day  Vaping Use   Vaping status: Never Used  Substance Use Topics   Alcohol use: No   Drug use: No     Family History  Problem Relation Age of Onset   Hypertension Mother    Cerebral aneurysm Mother   No bleeding or clotting disorders  Allergies  Allergen Reactions   Neosporin [Bacitracin-Polymyxin B] Rash   Sulfa Antibiotics Rash     REVIEW OF SYSTEMS (Negative unless checked)  Constitutional: [] Weight loss  [] Fever  [] Chills Cardiac: [] Chest pain   [] Chest pressure   [x] Palpitations   [] Shortness of breath when laying flat   [x] Shortness of breath at rest   [x] Shortness of breath with exertion. Vascular:  [x] Pain in legs with walking   [] Pain in legs at rest   [] Pain in legs when laying flat   [] Claudication   [] Pain in feet when walking  [] Pain in feet at rest  [] Pain in feet when laying flat   [] History of DVT   [] Phlebitis   [] Swelling in legs   [] Varicose veins   [] Non-healing ulcers Pulmonary:   [] Uses home oxygen   [] Productive cough   [] Hemoptysis   [] Wheeze  [x] COPD   [] Asthma Neurologic:  [] Dizziness  [] Blackouts   [] Seizures   [] History of stroke   [] History of TIA  [] Aphasia   [] Temporary blindness   [] Dysphagia   [] Weakness or numbness in arms   [] Weakness or numbness in legs Musculoskeletal:  [] Arthritis   [] Joint swelling   [] Joint pain   [] Low back pain Hematologic:  [] Easy bruising  [] Easy bleeding   [] Hypercoagulable state   [x] Anemic  [] Hepatitis Gastrointestinal:  [] Blood in stool   [] Vomiting blood  [] Gastroesophageal reflux/heartburn   [] Difficulty swallowing. Genitourinary:  [x] Chronic kidney disease   [] Difficult urination  [] Frequent urination  [] Burning with urination   [] Blood in urine Skin:  [] Rashes   [] Ulcers    [] Wounds Psychological:  [] History of anxiety   []  History of major depression.  Physical Examination  Vitals:   01/19/24 1247 01/19/24 1248  BP:  (!) 122/93  Pulse:  94  Resp:  15  Temp: 97.8 F (36.6 C)   TempSrc: Oral   SpO2:  100%  Weight:  52.6 kg  Height:  5' 3 (1.6 m)   Body mass index is 20.55 kg/m. Gen: WD/WN, NAD Head: [place]/AT, No temporalis wasting.  Ear/Nose/Throat: Hearing grossly intact, nares w/o erythema or drainage, oropharynx w/o Erythema/Exudate,  Eyes: Conjunctiva clear, sclera non-icteric Neck: Trachea midline.  No JVD.  Pulmonary:  Good air movement, respirations not  labored, no use of accessory muscles.  Cardiac: RRR, normal S1, S2. Vascular:  Vessel Right Left  Radial Palpable Palpable                                   Gastrointestinal: soft, non-tender/distended. No guarding/reflex.  Musculoskeletal: M/S 5/5 throughout.  Extremities without ischemic changes.  No deformity or atrophy.  Neurologic: Sensation grossly intact in extremities.  Symmetrical.  Speech is fluent. Motor exam as listed above. Psychiatric: Judgment intact, Mood & affect appropriate for pt's clinical situation. Dermatologic: No rashes or ulcers noted.  No cellulitis or open wounds.      CBC Lab Results  Component Value Date   WBC 7.0 04/26/2023   HGB 9.7 (L) 12/21/2023   HCT 31.4 (L) 12/21/2023   MCV 98.6 04/26/2023   PLT 157 04/26/2023    BMET    Component Value Date/Time   NA 130 (L) 04/26/2023 0743   NA 132 (L) 03/06/2019 1418   K 3.9 04/26/2023 0743   CL 93 (L) 04/26/2023 0743   CO2 25 04/26/2023 0743   GLUCOSE 113 (H) 04/26/2023 0743   BUN 38 (H) 04/26/2023 0743   BUN 41 (H) 03/06/2019 1418   CREATININE 4.51 (H) 04/26/2023 0743   CALCIUM  8.0 (L) 04/26/2023 0743   GFRNONAA 12 (L) 04/26/2023 0743   GFRAA 16 (L) 06/13/2019 1150   CrCl cannot be calculated (Patient's most recent lab result is older than the maximum 21 days allowed.).  COAG Lab  Results  Component Value Date   INR 1.5 (H) 10/04/2022   INR 1.6 (H) 03/10/2022   INR >10.0 (HH) 03/07/2022    Radiology US  Paracentesis Result Date: 01/19/2024 INDICATION: 48 year old female with history of ESRD on HD, cirrhosis, and with recurrent ascites. IR was requested for therapeutic paracentesis. 5 L maximum. EXAM: ULTRASOUND GUIDED THERAPEUTIC PARACENTESIS MEDICATIONS: 6 cc of 1% lidocaine . COMPLICATIONS: None immediate. PROCEDURE: Informed written consent was obtained from the patient after a discussion of the risks, benefits and alternatives to treatment. A timeout was performed prior to the initiation of the procedure. Initial ultrasound scanning demonstrates a large amount of ascites within the right lower abdominal quadrant. The right lower abdomen was prepped and draped in the usual sterile fashion. 1% lidocaine  was used for local anesthesia. Following this, a 19 gauge, 7-cm, Yueh catheter was introduced. An ultrasound image was saved for documentation purposes. The paracentesis was performed. The catheter was removed and a dressing was applied. The patient tolerated the procedure well without immediate post procedural complication. FINDINGS: A total of approximately 3.2 L of clear, dark amber versus blood-tinged peritoneal fluid was removed. IMPRESSION: Successful ultrasound-guided paracentesis yielding 3.2 liters of peritoneal fluid. Procedure performed by Carlin Griffon, PA-C Electronically Signed   By: CHRISTELLA.  Shick M.D.   On: 01/19/2024 11:32   US  Paracentesis Result Date: 12/31/2023 INDICATION: Other ascites 48 year old female with history of ESRD on HD, cirrhosis, and with recurrent ascites. IR was requested for therapeutic paracentesis. 5 L maximum. EXAM: ULTRASOUND GUIDED THERAPEUTIC PARACENTESIS MEDICATIONS: 6 cc of 1% lidocaine  COMPLICATIONS: None immediate. PROCEDURE: Informed written consent was obtained from the patient after a discussion of the risks, benefits and alternatives to  treatment. A timeout was performed prior to the initiation of the procedure. Initial ultrasound scanning demonstrates a large amount of ascites within the right lower abdominal quadrant. The right lower abdomen was prepped and draped in the usual sterile fashion.  1% lidocaine  was used for local anesthesia. Following this, a 19 gauge, 7-cm, Yueh catheter was introduced. An ultrasound image was saved for documentation purposes. The paracentesis was performed. The catheter was removed and a dressing was applied. The patient tolerated the procedure well without immediate post procedural complication. FINDINGS: A total of approximately 3.2 L of clear, straw-colored peritoneal fluid was removed. IMPRESSION: Successful ultrasound-guided paracentesis yielding 3.2 L of peritoneal fluid. Procedure performed by Carlin Griffon, PA-C under supervision of Thom Hall, MD Electronically Signed   By: Thom Hall M.D.   On: 12/31/2023 14:16   CT ABDOMEN PELVIS W CONTRAST Result Date: 12/22/2023 CLINICAL DATA:  Abdominal wall hematoma status post procedure. EXAM: CT ABDOMEN AND PELVIS WITH CONTRAST TECHNIQUE: Multidetector CT imaging of the abdomen and pelvis was performed using the standard protocol following bolus administration of intravenous contrast. RADIATION DOSE REDUCTION: This exam was performed according to the departmental dose-optimization program which includes automated exposure control, adjustment of the mA and/or kV according to patient size and/or use of iterative reconstruction technique. CONTRAST:  OMNIPAQUE  IOHEXOL  300 MG/ML  SOLN COMPARISON:  April 22, 2023. FINDINGS: Lower chest: Mild left basilar subsegmental atelectasis is noted. Minimal left pleural effusion is noted. Hepatobiliary: No focal liver abnormality is seen. Status post cholecystectomy. No biliary dilatation. Pancreas: Unremarkable. No pancreatic ductal dilatation or surrounding inflammatory changes. Spleen: Moderate splenomegaly.  Adrenals/Urinary Tract: Adrenal glands appear normal. Bilateral renal atrophy consistent with end-stage renal disease. No hydronephrosis or renal obstruction. Urinary bladder is decompressed. Stomach/Bowel: Stomach is unremarkable. There is no evidence of bowel obstruction or inflammation. Appendix is not clearly visualized. Vascular/Lymphatic: Aortic atherosclerosis. No enlarged abdominal or pelvic lymph nodes. Reproductive: Uterus and bilateral adnexa are unremarkable. Other: Mild ascites is noted.  No definite hernia. Musculoskeletal: No acute or significant osseous findings. IMPRESSION: Mild ascites. Mild left basilar subsegmental atelectasis with small left pleural effusion. Moderate splenomegaly. Bilateral renal atrophy consistent with end-stage renal disease. Aortic Atherosclerosis (ICD10-I70.0). Electronically Signed   By: Lynwood Landy Raddle M.D.   On: 12/22/2023 11:26   US  ASCITES (ABDOMEN LIMITED) Result Date: 12/21/2023 CLINICAL DATA:  47 year old female with a history of ESRD on dialysis, cirrhosis, and recurrent ascites. Request for stat paracentesis with labs due to worsening abdominal swelling and tenderness since her last paracentesis on 12/15/2023. EXAM: ULTRASOUND ABDOMEN LIMITED COMPARISON:  US  PARACENTESIS - 12/15/23 FINDINGS: Limited ultrasound done of all 4 quadrants. There is only small volume of ascites present. However, on further evaluation US  concerning for hematoma of the LLQ. Given the small amount of fluid, minimal concern for infection or SBP, and moderately sized hematoma of the left abdomen, no paracentesis was performed. IMPRESSION: Small volume ascites without generalized tenderness. Moderate size hematoma of the lower left abdomen. No paracentesis performed. See note in chart for full evaluation Performed by: Sherrilee Bal, PA-C under the supervision of Dr. KANDICE Banner Electronically Signed   By: Cordella Banner   On: 12/21/2023 16:19     Assessment/Plan 1.  End-stage renal  disease.  For PermCath exchange today due to dysfunction of PermCath. 2.  Cirrhosis.  With ascites and multiple paracentesis. 3.  Coronary disease with dilated cardiomyopathy.  Monitor cardiac leads during the procedure.   Selinda Gu, MD  01/19/2024 3:44 PM

## 2024-01-19 NOTE — Discharge Instructions (Signed)
 Reviewed with patient

## 2024-01-19 NOTE — Discharge Instructions (Signed)
Tunneled Catheter Insertion, Care After The following information offers guidance on how to care for yourself after your procedure. Your health care provider may also give you more specific instructions. If you have problems or questions, contact your health care provider. What can I expect after the procedure? After the procedure, it is common to have: Some mild redness, bruising, swelling, and pain around your catheter site. A small amount of blood or clear fluid coming from your incisions. Follow these instructions at home: Medicines Take over-the-counter and prescription medicines only as told by your health care provider. If you were prescribed an antibiotic medicine, take it as told by your health care provider. Do not stop taking the antibiotic even if you start to feel better. Incision care  Follow instructions from your health care provider about how to take care of your incisions. Make sure you: Your dialysis center will perform all needed dressing changes.  Leave stitches (sutures), skin glue, or adhesive strips in place. These skin closures may need to stay in place for 2 weeks or longer. Keep your dressings clean and dry. Check your incision areas every day for signs of infection. Check for: More redness, swelling, or pain. More fluid or blood. Warmth. Pus or a bad smell. Catheter care  Keep your catheter site clean and dry. Do not shower, sponge bath only while catheter in place.  Your dialysis center will Flush your catheter per the dialysis center protocol.  This helps prevent it from becoming clogged. Leave thecaps on the ends of the catheter when not in use. Do not pull on your catheter. Activity Return to your normal activities as told by your health care provider. Ask your health care provider what activities are safe for you. Follow any other activity restrictions as instructed by your health care provider. Do not lift anything that is heavier than 10 lb (4.5 kg), or  the limit that you are told, until your health care provider says that it is safe. Driving Do not drive for 24 hours.  General instructions Follow your health care provider's specific instructions for the type of catheter that you have. Do not take baths, swim, or use a hot tub while your catheter is in place. Keep all follow-up visits. This is important to prevent infection.  Contact a health care provider if: You feel unusually weak or nauseous. You have a fever or chills. You have more redness, swelling, or pain at your incisions or around the area where your catheter has been inserted or where it exits. You have pus or a bad smell coming from your catheter site. Your catheter site feels warm to the touch. Your catheter is not working properly. Fluid is leaking from the catheter, under the dressing, or around the dressing. Your dialysis center is unable to flush your catheter. Get help right away if: Your catheter develops a hole or it breaks. Your catheter comes loose or gets pulled completely out. If this happens, press on your catheter site firmly with a clean cloth until you can get medical help. You develop bleeding from your catheter or your insertion site, and your bleeding does not stop. You have swelling in your shoulder, neck, chest, or face. You have pain or swelling when fluids or medicines are being given through the catheter. You have chest pain or difficulty breathing. These symptoms may represent a serious problem that is an emergency. Do not wait to see if the symptoms will go away. Get medical help right away. Call your   local emergency services (911 in the [place] Do not drive yourself to the hospital. Summary After the procedure, it is common to have mild redness, swelling, and pain around your catheter site. Return to your normal activities as told by your health care provider. Ask your health care provider what activities are safe for you. Follow your health care  provider's specific instructions for the type of catheter that you have. Keep your catheter site and your dressings clean and dry. Contact a health care provider if your catheter is not working properly. Get help right away if you have chest pain, difficulty breathing, or your catheter comes loose or gets pulled completely out. This information is not intended to replace advice given to you by your health care provider. Make sure you discuss any questions you have with your health care provider. Document Revised: 10/07/2020 Document Reviewed: 10/07/2020 Elsevier Patient Education  2023 [employer] Inc[employer]     

## 2024-01-19 NOTE — Op Note (Signed)
 OPERATIVE NOTE    PRE-OPERATIVE DIAGNOSIS: 1. ESRD 2. Non-functional permcath  POST-OPERATIVE DIAGNOSIS: same as above  PROCEDURE: Fluoroscopic guidance for placement of catheter Placement of a 19 cm tip to cuff tunneled hemodialysis catheter via the right internal jugular vein and removal of previous catheter  SURGEON: Selinda Gu, MD  ANESTHESIA:  Local with moderate conscious sedation for 13 minutes using 2 mg of Versed  and 100 mcg of Fentanyl   ESTIMATED BLOOD LOSS: 3 cc  FINDING(S): none  SPECIMEN(S):  None  INDICATIONS:   Patient is a 48 y.o.female who presents with non-functional dialysis catheter and ESRD.  The patient needs long term dialysis access for their ESRD, and a Permcath is necessary.  Risks and benefits are discussed and informed consent is obtained.    DESCRIPTION: After obtaining full informed written consent, the patient was brought back to the vascular suite. The patient received moderate conscious sedation during a face-to-face encounter with me present throughout the entire procedure and supervising the RN monitoring the vital signs, pulse oximetry, telemetry, and mental status throughout the entire procedure. The patient's existing catheter, right neck and chest were sterilely prepped and draped in a sterile surgical field was created.  The existing catheter was dissected free from the fibrous sheath securing the cuff with hemostats and blunt dissection.  A wire was placed. The existing catheter was then removed and the wire used to keep venous access. I selected a 19 cm tip to cuff tunneled dialysis catheter.  Using fluoroscopic guidance the catheter tips were parked in the right atrium. The appropriate distal connectors were placed. It withdrew blood well and flushed easily with heparinized saline and a concentrated heparin  solution was then placed. It was secured to the chest wall with 2 Prolene sutures. A 4-0 Monocryl pursestring suture was placed around the  exit site. Sterile dressings were placed. The patient tolerated the procedure well and was taken to the recovery room in stable condition.  COMPLICATIONS: None  CONDITION: Stable  Selinda Gu 01/19/2024 4:13 PM   This note was created with Dragon Medical transcription system. Any errors in dictation are purely unintentional.

## 2024-01-20 ENCOUNTER — Encounter: Payer: Self-pay | Admitting: Vascular Surgery

## 2024-01-27 ENCOUNTER — Other Ambulatory Visit: Payer: Self-pay | Admitting: Internal Medicine

## 2024-01-27 ENCOUNTER — Ambulatory Visit
Admission: RE | Admit: 2024-01-27 | Discharge: 2024-01-27 | Disposition: A | Discharge status: Home / Self Care | Source: Ambulatory Visit | Attending: Internal Medicine | Admitting: Internal Medicine

## 2024-01-27 DIAGNOSIS — N186 End stage renal disease: Secondary | ICD-10-CM | POA: Insufficient documentation

## 2024-01-27 DIAGNOSIS — R188 Other ascites: Secondary | ICD-10-CM

## 2024-01-27 DIAGNOSIS — Z992 Dependence on renal dialysis: Secondary | ICD-10-CM | POA: Diagnosis not present

## 2024-01-27 DIAGNOSIS — K7460 Unspecified cirrhosis of liver: Secondary | ICD-10-CM | POA: Diagnosis not present

## 2024-01-27 MED ORDER — LIDOCAINE HCL (PF) 1 % IJ SOLN
10.0000 mL | Freq: Once | INTRAMUSCULAR | Status: AC
  Administered 2024-01-27: 10 mL via INTRADERMAL
  Filled 2024-01-27: qty 10

## 2024-01-27 NOTE — Procedures (Signed)
 PROCEDURE SUMMARY:  Successful US  guided paracentesis from left lateral abdomen.  Yielded 2.6 liters of serous fluid.  No immediate complications.  Patient tolerated well.  EBL = trace   Kebrina Friend M Nene Aranas PA-C 01/27/2024 1:50 PM

## 2024-02-15 ENCOUNTER — Other Ambulatory Visit: Payer: Self-pay | Admitting: Internal Medicine

## 2024-02-15 DIAGNOSIS — R188 Other ascites: Secondary | ICD-10-CM

## 2024-02-16 ENCOUNTER — Ambulatory Visit
Admission: RE | Admit: 2024-02-16 | Discharge: 2024-02-16 | Disposition: A | Discharge status: Home / Self Care | Source: Ambulatory Visit | Attending: Internal Medicine | Admitting: Internal Medicine

## 2024-02-16 DIAGNOSIS — R188 Other ascites: Secondary | ICD-10-CM

## 2024-02-17 ENCOUNTER — Ambulatory Visit
Admission: RE | Admit: 2024-02-17 | Discharge: 2024-02-17 | Disposition: A | Discharge status: Home / Self Care | Source: Ambulatory Visit | Attending: Internal Medicine | Admitting: Internal Medicine

## 2024-02-17 DIAGNOSIS — R188 Other ascites: Secondary | ICD-10-CM | POA: Insufficient documentation

## 2024-02-17 MED ORDER — LIDOCAINE HCL (PF) 1 % IJ SOLN
10.0000 mL | Freq: Once | INTRAMUSCULAR | Status: AC
  Administered 2024-02-17: 10 mL via INTRADERMAL

## 2024-02-17 NOTE — Procedures (Signed)
 PROCEDURE SUMMARY:  Successful image-guided paracentesis from the left abdomen.  Yielded 2.7 liters of clear, amber colored peritoneal fluid.  No immediate complications.  EBL: zero Patient tolerated well.   Please see imaging section of Epic for full dictation.  Carlin LABOR Takari Lundahl PA-C 02/17/2024 1:39 PM

## 2024-02-28 ENCOUNTER — Other Ambulatory Visit: Payer: Self-pay | Admitting: Internal Medicine

## 2024-02-28 ENCOUNTER — Ambulatory Visit
Admission: RE | Admit: 2024-02-28 | Discharge: 2024-02-28 | Disposition: A | Discharge status: Home / Self Care | Source: Ambulatory Visit | Attending: Internal Medicine | Admitting: Internal Medicine

## 2024-02-28 DIAGNOSIS — N186 End stage renal disease: Secondary | ICD-10-CM | POA: Diagnosis not present

## 2024-02-28 DIAGNOSIS — K7460 Unspecified cirrhosis of liver: Secondary | ICD-10-CM | POA: Diagnosis not present

## 2024-02-28 DIAGNOSIS — R188 Other ascites: Secondary | ICD-10-CM

## 2024-02-28 DIAGNOSIS — Z992 Dependence on renal dialysis: Secondary | ICD-10-CM | POA: Diagnosis not present

## 2024-02-28 MED ORDER — LIDOCAINE HCL (PF) 1 % IJ SOLN
7.0000 mL | Freq: Once | INTRAMUSCULAR | Status: AC
  Administered 2024-02-28: 7 mL via INTRADERMAL
  Filled 2024-02-28: qty 8

## 2024-02-28 NOTE — Procedures (Signed)
 PROCEDURE SUMMARY:  Successful US  guided paracentesis from left lateral abdomen.  Yielded 2.9 liters of blood-tinged fluid.  No immediate complications.  Patient tolerated well.  EBL = trace   Ricahrd Schwager CHRISTELLA Bal PA-C 02/28/2024 4:19 PM

## 2024-03-13 ENCOUNTER — Other Ambulatory Visit: Payer: Self-pay | Admitting: Internal Medicine

## 2024-03-13 DIAGNOSIS — R188 Other ascites: Secondary | ICD-10-CM

## 2024-03-13 NOTE — ED Provider Notes (Signed)
 [facility] Emergency Department Provider Note   ED Clinical Impression   Final diagnoses:  Anasarca (Primary)  SOB (shortness of breath)    Initial Impression, ED Course, Assessment and Plan   Impression: Patient is a 48 y.o. female with a past medical history of ESRD (HD TuThSat), HIV/AIDS, chronic PE (on Eliquis ), DVT (09/2023), CHF, NICM with reduced EF (LVEF: 20-25%, 04/19/2023), COPD, HTN, Hepatitis C, cirrhosis, and tobacco use who presents for fluid overload in her abdomen and BLEs as well as nausea, poor appetite, heart palpitations, productive cough with yellow/white phlegm, shortness of breath on exertion, and diarrhea.   ED Triage Vitals [03/13/24 1223]  Enc Vitals Group     BP 126/93     Pulse      SpO2 Pulse 104     Resp 18     Temp 36.4 C (97.5 F)     Temp Source Oral     SpO2 100 %     Weight 51 kg (112 lb 7 oz)     Height 1.6 m (5' 3)   On exam, the patient appears uncomfortable. Triage vital signs are remarkable for tachycardia to 104 but are otherwise within normal limits. Patient is afebrile and hemodynamically stable. On physical exam patient appears chronically ill but nontoxic, notable for distended abdomen without tenderness to palpation. 1+ pitting edema to BLE. Legs with no discoloration or tenderness to palpation. Heart regular rate and rhythm. No murmurs, rubs, or gallops. Lungs clear to auscultation bilaterally. No wheezing, rales, or rhonchi.  Breathing and talking comfortably.   Differential includes: Swelling of legs and abdomen is not a new problem, ongoing for months but has been getting worse recently, likely related to CHF and cirrhosis and ESRD. She has been compliant w/ Eliquis  w/ exception of last night's dose, has no chest pain and no leg pain, no discoloration or tenderness w/in legs and thus fairly now concern for DVT/PE. Patient had large volume paracentesis 2 weeks ago and has another scheduled for Thursday says she is too uncomfortable  to wait that long. PNA and URI might explain her general malaise and poor appetite; though no fever or chills, UTI considered however patient has had decreased urinary frequency not increased and denies dysuria or flank pain.     ECG shows NSR, Qtc of 574, LBBB, No significant or concordant ST deviations; unchanged from prior.   ED Course as of 03/13/24 1726  Mon Mar 13, 2024  1414 Mild leukocytosis but Normal absolute neutrophil count, stable anemia with hemoglobin of 9.9, thrombocytosis  1425 Potassium 5.1, otherwise normal electrolytes.  Magnesium  2.0.  Hypoalbuminemia of 1.7, otherwise reassuring LFTs.  1426 hsTroponin I(!!): 43 Better than recent baseline; Will trend  1426 PRO-BNP(!): 190,352.0 Roughly baseline for patient  1426 Respiratory pathogen panel negative  1426 hCG Quant: <2.6  CXR shows stable cardiomegaly with persistent left lower lung atelectasis/airspace disease and probable small left pleural effusion.  Given no fever, no leukocytosis or left shift, no chest pain, and no change in XR I am not inclined to treat this finding as pneumonia.   Patient care signed out to Diane Miller pending repeat troponin and discussion w/ Family Medicine to see if they could do a large volume paracentesis.   Additional Medical Decision Making   I independently visualized the EKG tracing. See above I independently visualized the radiology images. See above Dr. Cleotilde discussed case with Family Medicine, asking if able to perform LVP   Labs and radiology results  that were available during my care of the patient were independently reviewed by me and considered in my medical decision making.  Portions of this record have been created using Scientist, clinical (histocompatibility and immunogenetics). Dictation errors have been sought, but may not have been identified and corrected. ____________________________________________  I have reviewed the triage vital signs and the nursing notes.   History   Chief  Complaint Edema   HPI  Rebecca Bridges is a 48 y.o. female with a past medical history of ESRD (HD TuThSat), HIV/AIDS, chronic PE (on Eliquis ), DVT (09/2023), CHF, NICM with reduced EF (LVEF: 20-25%, 04/19/2023), COPD, HTN, Hepatitis C, cirrhosis, and tobacco use who presents for edema. The patient reports fluid overload in her abdomen and BLEs as well as nausea, poor appetite, heart palpitations, productive cough with yellow/white phlegm, shortness of breath on exertion, and diarrhea. She further notes decreased urine output. The patient states her last dialysis was Saturday as scheduled where they pulled off a normal amount of fluid. She last got paracentesis  2 weeks ago and couldn't get in again until 10/2. The patient missed her dose of Eliquis  last night since she did not feel well. Denies chest pain.     Past Medical History[1]  Problem List[2]  Past Surgical History[3]   Current Facility-Administered Medications:  .  albumin  human 25 % 25 g, 25 g, Intravenous, Once, Arvil Rockey BRAVO, MD, Last Rate: 200 mL/hr at 03/13/24 1705, 25 g at 03/13/24 1705  Current Outpatient Medications:  .  apixaban  (ELIQUIS ) 5 mg Tab, Take 1 tablet (5 mg total) by mouth two (2) times a day., Disp: 60 tablet, Rfl: 1 .  bictegrav-emtricit-tenofov ala (BIKTARVY ) 50-200-25 mg tablet, Take 1 tablet by mouth nightly., Disp: 30 tablet, Rfl: 1 .  calcitriol (ROCALTROL) 0.25 MCG capsule, Take 5 capsules (1.25 mcg total) by mouth Every Tuesday, Thursday and Saturday., Disp: 60 capsule, Rfl: 11 .  calcium  acetate,phosphat bind, (PHOSLO ) 667 mg capsule, Take 3 capsules (2,001 mg total) by mouth Three (3) times a day with a meal., Disp: , Rfl:  .  gabapentin  (NEURONTIN ) 300 MG capsule, Take 1 capsule (300 mg total) by mouth at bedtime., Disp: , Rfl:  .  guaiFENesin  200 mg/5 mL Liqd, Take 200 mg by mouth four (4) times a day as needed (cough)., Disp: 118 mL, Rfl: 0 .  metoPROLOL  succinate (TOPROL -XL) 25 MG 24 hr tablet, Take 1  tablet (25 mg total) by mouth daily., Disp: 30 tablet, Rfl: 0 .  pantoprazole  (PROTONIX ) 40 MG tablet, Take 1 tablet (40 mg total) by mouth daily., Disp: 30 tablet, Rfl: 1 .  tetrahydrozoline (VISINE) 0.05 % ophthalmic solution, 1 drop Three (3) times a day., Disp: , Rfl:  .  traZODone  (DESYREL ) 50 MG tablet, Take 1 tablet (50 mg total) by mouth nightly as needed., Disp: , Rfl:   Allergies Hydrocortisone, Sulfa (sulfonamide antibiotics), Tylenol  [acetaminophen ], and Bacitracin-polymyxin b  Family History[4]  Social History Short Social History[5]  Physical Exam   Constitutional: Alert and oriented. Well appearing and in no distress. Eyes: Conjunctivae are normal. ENT      Head: Normocephalic and atraumatic.      Nose: No congestion.      Mouth/Throat: Mucous membranes are moist.      Neck: No stridor. Cardiovascular: Normal rate, regular rhythm. No murmurs, rubs, or gallops.  Respiratory: Normal respiratory effort. Breath sounds are normal. No wheezing, rales, or rhonchi. Breathing and talking comfortably.  Gastrointestinal: Distended abdomen without tenderness to palpation. There is  no CVA tenderness. Genitourinary: Deferred. Musculoskeletal: Normal range of motion in all extremities. 1+ pitting edema to BLE. No discoloration or tenderness to palpation.  Neurologic: Normal speech and language. No gross focal neurologic deficits are appreciate; normal strength and sensation Skin: Skin is warm, dry and intact. No rash noted. Psychiatric: Mood and affect are normal. Speech and behavior are normal.  Procedures   Procedure(s) performed: None.  Documentation assistance was provided by Abigail Gillikin, Scribe on March 13, 2024 at 12:45 PM for Oneil Mitchell, MD.  Documentation assistance was provided by the scribe in my presence.  The documentation recorded by the scribe has been reviewed by me and accurately reflects the services I personally performed.          [1] Past  Medical History: Diagnosis Date  . Acute blood loss anemia 04/22/2023  . AIDS (acquired immune deficiency syndrome)    (CMS-HCC) 04/22/2023  . Atypical squamous cell changes of undetermined significance (ASCUS) on cervical cytology with positive high risk human papilloma virus (HPV) 03/03/2016   7/17:  ASCUS/HPV+ >> colpo recommended    . Bacteremia due to Streptococcus pneumoniae 04/21/2023  . Chronic hepatitis C virus infection    (CMS-HCC) 03/17/2017  . Chronic pulmonary embolism without acute cor pulmonale    (CMS-HCC) 05/09/2021  . Chronic systolic congestive heart failure    (CMS-HCC) 10/17/2019  . COPD (chronic obstructive pulmonary disease)    (CMS-HCC) 04/16/2022  . Dilated cardiomyopathy    (CMS-HCC) 02/15/2019  . Duodenal ulcer 11/19/2021  . Embolism and thrombosis of artery    (CMS-HCC) 06/06/2019   a. L AT thromboembolecomty and PTA.    SABRA Embolism of artery of lower extremity    (CMS-HCC) 01/26/2019  . End-stage renal disease on hemodialysis    (CMS-HCC) 02/24/2021  . ESRD on dialysis    (CMS-HCC) 01/11/2023   Last Assessment & Plan:    Patient has a history of end-stage renal disease on hemodialysis   Dialysis days are Tuesday/Thursday/Saturday   Nephrology consult for renal replacement therapy during this hospitalization    . 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 Herpes zoster ophthalmicus, right eye 01/17/2022   Last Assessment & Plan:      Patient with a history of herpes zoster ophthalmicus involving the right eye.   Continue Valtrex  which patient has been on prior to admission   Follow-up with ophthalmology as an outpatient    . HIV (human immunodeficiency virus infection)    (CMS-HCC) 11/28/2013  . HTN (hypertension), malignant 08/07/2017  . Hyperkalemia 03/16/2023  . Liver cirrhosis    (CMS-HCC) 01/17/2022   Last Assessment & Plan:    Patient has a history of chronic hepatitis C and now has liver cirrhosis with evidence of portal hypertension.   Stable    .  Non-adherence to medical treatment 03/14/2015  . Prolonged Q-T interval on ECG 03/14/2015  . Red blood cell antibody positive 12/30/2021   Anti-M detected. Please allow 2 hours to prepare appropriate RBCs for transfusion.    . Right sided abdominal pain 03/16/2023  . Splenomegaly 11/27/2013  . Thrombocytopenia 03/14/2015  . Tobacco user 11/28/2013  [2] Patient Active Problem List Diagnosis  . Chronic hepatitis C virus infection    (CMS-HCC)  . Chronic pulmonary embolism without acute cor pulmonale    (CMS-HCC)  . Chronic systolic congestive heart failure    (CMS-HCC)  . Left bundle branch block (LBBB)  . ESRD on dialysis    (CMS-HCC)  . Liver cirrhosis    (  CMS-HCC)  . Non-adherence to medical treatment  . Thrombocytopenia  . Nausea and vomiting  . Hyperkalemia  . HIV (human immunodeficiency virus infection)    (CMS-HCC)  . Ascites  . Anemia  . Left shoulder pain  . COPD (chronic obstructive pulmonary disease)    (CMS-HCC)  . Atypical squamous cell changes of undetermined significance (ASCUS) on cervical cytology with positive high risk human papilloma virus (HPV)  . AIDS (acquired immune deficiency syndrome)    (CMS-HCC)  . Dilated cardiomyopathy    (CMS-HCC)  . End-stage renal disease on hemodialysis    (CMS-HCC)  . HTN (hypertension), malignant  . Prolonged Q-T interval on ECG  . Red blood cell antibody positive  . Splenomegaly  . Fever  . Vision changes  . Volume overload  [3] No past surgical history on file. [4] Family History Problem Relation Age of Onset  . Hypertension Mother   . Coronary artery disease Mother   . Cerebral aneurysm Mother   . Hypertension Father   . Hypertension Sister   . Heart failure Maternal Grandmother   [5] Social History Tobacco Use  . Smoking status: Former    Current packs/day: 0.50    Average packs/day: 0.5 packs/day for 21.0 years (10.5 ttl pk-yrs)    Types: Cigarettes    Start date: 03/16/2003  . Smokeless tobacco: Never   Vaping Use  . Vaping status: Never Used  Substance Use Topics  . Alcohol use: Not Currently  . Drug use: Never   Hoppens, Oneil LABOR, MD 03/13/24 [phone]

## 2024-03-16 ENCOUNTER — Ambulatory Visit: Admission: RE | Admit: 2024-03-16

## 2024-03-21 ENCOUNTER — Ambulatory Visit
Admission: RE | Admit: 2024-03-21 | Discharge: 2024-03-21 | Disposition: A | Discharge status: Home / Self Care | Source: Ambulatory Visit | Attending: Internal Medicine | Admitting: Internal Medicine

## 2024-03-21 DIAGNOSIS — R188 Other ascites: Secondary | ICD-10-CM | POA: Insufficient documentation

## 2024-03-21 DIAGNOSIS — K7460 Unspecified cirrhosis of liver: Secondary | ICD-10-CM | POA: Diagnosis not present

## 2024-03-21 MED ORDER — LIDOCAINE HCL (PF) 1 % IJ SOLN
10.0000 mL | Freq: Once | INTRAMUSCULAR | Status: AC
  Administered 2024-03-21: 10 mL via INTRADERMAL
  Filled 2024-03-21: qty 10

## 2024-03-21 NOTE — Procedures (Signed)
 PROCEDURE SUMMARY:  Successful US  guided paracentesis from left lateral abdomen.  Yielded 2.9 liters of amber fluid.  No immediate complications.  Patient tolerated well.  EBL = trace   Rebecca Joost CHRISTELLA Bal PA-C 03/21/2024 2:20 PM

## 2024-04-03 ENCOUNTER — Other Ambulatory Visit: Payer: Self-pay | Admitting: Internal Medicine

## 2024-04-03 DIAGNOSIS — R188 Other ascites: Secondary | ICD-10-CM

## 2024-04-04 ENCOUNTER — Ambulatory Visit
Admission: RE | Admit: 2024-04-04 | Discharge: 2024-04-04 | Disposition: A | Discharge status: Home / Self Care | Source: Ambulatory Visit | Attending: Internal Medicine | Admitting: Internal Medicine

## 2024-04-04 DIAGNOSIS — R188 Other ascites: Secondary | ICD-10-CM | POA: Diagnosis present

## 2024-04-04 MED ORDER — LIDOCAINE HCL (PF) 2 % IJ SOLN
10.0000 mL | Freq: Once | INTRAMUSCULAR | Status: AC
  Administered 2024-04-04: 10 mL via INTRADERMAL
  Filled 2024-04-04: qty 10

## 2024-04-04 NOTE — Procedures (Signed)
 PROCEDURE SUMMARY:  Successful image-guided paracentesis from the left lower abdomen.  Yielded 2.6 liters of clear, dark yellow fluid.  No immediate complications.  EBL < 1 mL Patient tolerated well.   Specimen was not sent for labs.  Please see imaging section of Epic for full dictation.  Clotilda DELENA Hesselbach PA-C 04/04/2024 8:48 AM

## 2024-04-12 ENCOUNTER — Other Ambulatory Visit: Payer: Self-pay | Admitting: Internal Medicine

## 2024-04-12 DIAGNOSIS — R188 Other ascites: Secondary | ICD-10-CM

## 2024-04-12 NOTE — ED Triage Notes (Signed)
 Pt ambulatory to triage with C/O right leg swelling. Pt gets dialysis Tuesday, Thursday Saturday

## 2024-04-13 ENCOUNTER — Telehealth (INDEPENDENT_AMBULATORY_CARE_PROVIDER_SITE_OTHER): Payer: Self-pay | Admitting: Vascular Surgery

## 2024-04-13 NOTE — Telephone Encounter (Signed)
 Called pt 2x attempting to schedule fu from being in the ED yesterday per Delores Pickles. NP.   LS 07/22/23. fu ed + Reflux + ABI see gs. per fb

## 2024-04-14 ENCOUNTER — Encounter (INDEPENDENT_AMBULATORY_CARE_PROVIDER_SITE_OTHER): Payer: Self-pay | Admitting: Nurse Practitioner

## 2024-04-14 ENCOUNTER — Other Ambulatory Visit (INDEPENDENT_AMBULATORY_CARE_PROVIDER_SITE_OTHER): Payer: Self-pay | Admitting: Nurse Practitioner

## 2024-04-14 ENCOUNTER — Other Ambulatory Visit (INDEPENDENT_AMBULATORY_CARE_PROVIDER_SITE_OTHER)

## 2024-04-14 ENCOUNTER — Ambulatory Visit
Admission: RE | Admit: 2024-04-14 | Discharge: 2024-04-14 | Disposition: A | Discharge status: Home / Self Care | Source: Ambulatory Visit | Attending: Internal Medicine | Admitting: Internal Medicine

## 2024-04-14 ENCOUNTER — Ambulatory Visit (INDEPENDENT_AMBULATORY_CARE_PROVIDER_SITE_OTHER): Admitting: Nurse Practitioner

## 2024-04-14 VITALS — BP 127/85 | HR 91 | Resp 17 | Ht 63.0 in | Wt 111.0 lb

## 2024-04-14 DIAGNOSIS — R188 Other ascites: Secondary | ICD-10-CM | POA: Insufficient documentation

## 2024-04-14 DIAGNOSIS — K7460 Unspecified cirrhosis of liver: Secondary | ICD-10-CM | POA: Diagnosis not present

## 2024-04-14 DIAGNOSIS — Z992 Dependence on renal dialysis: Secondary | ICD-10-CM | POA: Insufficient documentation

## 2024-04-14 DIAGNOSIS — I739 Peripheral vascular disease, unspecified: Secondary | ICD-10-CM

## 2024-04-14 DIAGNOSIS — M7989 Other specified soft tissue disorders: Secondary | ICD-10-CM

## 2024-04-14 DIAGNOSIS — N186 End stage renal disease: Secondary | ICD-10-CM

## 2024-04-14 MED ORDER — LIDOCAINE HCL (PF) 1 % IJ SOLN
10.0000 mL | Freq: Once | INTRAMUSCULAR | Status: AC
  Administered 2024-04-14: 10 mL via INTRADERMAL
  Filled 2024-04-14: qty 10

## 2024-04-14 NOTE — Procedures (Signed)
 PROCEDURE SUMMARY:  Successful US  guided paracentesis from left lower quadrant.  Yielded 2.7 L of clear yellow fluid.  No immediate complications.  Pt tolerated well.   Specimen not sent for labs.  EBL < 2 mL  Warren JONELLE Dais, NP 04/14/2024 10:00 AM

## 2024-04-17 LAB — VAS US ABI WITH/WO TBI
Left ABI: 1.22
Right ABI: 1.56

## 2024-04-17 NOTE — Progress Notes (Deleted)
  Cardiology Office Note   Date:  04/17/2024  ID:  Rebecca Bridges, DOB 28-Jun-1975, MRN [id number] PCP: [facility]  [facility] Providers Cardiologist:  Evalene Lunger, MD { Click to update primary MD,subspecialty MD or APP then REFRESH:1}    History of Present Illness Rebecca Bridges is a 48 y.o. 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  ***  Relevant CVD History -TTE 04/2023 LVEF 20 to 25% with global hypokinesis, moderately reduced RV function, mildly elevated PASP, moderate MR, moderate to severe TR - Monitor 02/2019 mean heart rate 85 bpm with 4 runs of SVT (longest 7 beats, max heart rate 160), rare ectopy. - Invasive coronary angiography 02/2019 nonobstructive CAD - LV thrombus 01/2019 complicated by left lower extremity thromboembolism status post thromboembolectomy   ROS: Pt denies any chest discomfort, jaw pain, arm pain, palpitations, syncope, presyncope, orthopnea, PND, or LE edema.  Studies Reviewed I have independently reviewed the patient's ECG, previous cardiac testing, previous medical records.  Physical Exam VS:  LMP 05/09/2019        Wt Readings from Last 3 Encounters:  04/14/24 111 lb (50.3 kg)  01/19/24 116 lb (52.6 kg)  08/13/23 110 lb 3.7 oz (50 kg)    GEN: No acute distress. NECK: No JVD; No carotid bruits. CARDIAC: ***RRR, no murmurs, rubs, gallops. RESPIRATORY:  Clear to auscultation. EXTREMITIES:  Warm and well-perfused. No edema.  ASSESSMENT AND PLAN NICM LBBB Moderate MR Moderate to severe TR History of LV thrombus complicated by thromboembolism to left lower extremity LBBB ESRD        {Are you ordering a CV Procedure (e.g. stress test, cath, DCCV, TEE, etc)?   Press F2        :[id number]}  Dispo:  ***  Signed, Caron Poser, MD

## 2024-04-18 ENCOUNTER — Ambulatory Visit

## 2024-04-23 ENCOUNTER — Encounter (INDEPENDENT_AMBULATORY_CARE_PROVIDER_SITE_OTHER): Payer: Self-pay | Admitting: Nurse Practitioner

## 2024-04-23 NOTE — Progress Notes (Signed)
 Subjective:    Patient ID: Rebecca Bridges, female    DOB: 18-Aug-1975, 48 y.o.   MRN: [id number] Chief Complaint  Patient presents with   Follow-up    LS 07/22/23. fu ed + Reflux + ABI    The patient presents today due to having leg swelling.  She recently visited the emergency room at [facility].  DVT was ruled out.  Additional noninvasive study showed concern for possible tibial level disease within her arteries.  The patient does have ascites and does require paracentesis.  She is scheduled for bronc today.  In addition she is currently on dialysis.  The patient also notes that she gets some furosemide  occasionally as well from her nephrologist.  She has noncompressible waveforms on the left.  ABI 1.22 on the right.  There is biphasic tibial waveforms.    Review of Systems  Cardiovascular:  Positive for leg swelling.  Gastrointestinal:  Positive for abdominal distention.  All other systems reviewed and are negative.      Objective:   Physical Exam Vitals reviewed.  HENT:     Head: Normocephalic.  Cardiovascular:     Rate and Rhythm: Normal rate.  Pulmonary:     Effort: Pulmonary effort is normal.  Skin:    General: Skin is warm and dry.  Neurological:     Mental Status: She is alert and oriented to person, place, and time.     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   a.) occluded anterior tibial, tibioperoneal trunk, and posterior tibial arteries --> thromboembolectomy and PTA of the anterior tibial artery   CAD (coronary artery disease) 02/15/2019   a.) R/LHC 02/15/2019: mild (non-obstructive) diffuse LAD and LCx disease   COPD (chronic obstructive pulmonary disease) (HCC)    COVID    Dilated cardiomyopathy (HCC)    a.) TTE 11/27/2013: EF 20%; b.) TTE 03/15/2015: EF 20%; c.) TTE 12/09/2015: EF 45%; d.) TTE  12/22/2016: EF 35%; e.) TTE 08/08/2017: EF 45-50%; f.) TTE 01/27/2019: EF 20-25%; g.) TTE 01/13/2021: EF <15%; h.) TTE 11/21/2021: EF <15%   ESRD (end stage renal disease) on dialysis (HCC)    a.) T-Th-Sat   GERD (gastroesophageal reflux disease)    Hepatitis C virus    HFrEF (heart failure with reduced ejection fraction) (HCC)    a.) TTE 11/27/2013: EF 20%; b.) TTE 03/15/2015: EF 20%; c.) TTE 12/09/2015: EF 45%; d.) TTE 12/22/2016: EF 35%, G1DD; e.) TTE 08/08/2017: EF 45-50%, G1DD; f.) TTE 01/27/2019: EF 20-25%, G1DD; g.) R/LHC 02/15/2019: mPA 25, mPCWP 13, CO 4.84, CI 3.15; h.) TTE 01/13/2021: EF <15%, sev glob HK, sep/apical AK, mod asym LVH, sev LAE, mild RAE, G2DD; I.) TTE 11/21/2021: EF <15%, G2DD   History of noncompliance with medical treatment    HIV (human immunodeficiency virus infection) (HCC) 2015   a.) on bictegravir-emtricitabine -tenofovir  (Biktarvy )   Hypertension    Insomnia    a.) on Trazodone  PRN   LBBB (left bundle branch block)    Liver disease, chronic, with cirrhosis (HCC)    Long term current use of anticoagulant    a.) apixaban    Pancytopenia (HCC)    Prolonged Q-T interval on ECG    Splenomegaly    Tobacco abuse     Social  History   Socioeconomic History   Marital status: Single    Spouse name: Not on file   Number of children: Not on file   Years of education: Not on file   Highest education level: Not on file  Occupational History   Not on file  Tobacco Use   Smoking status: Every Day    Current packs/day: 0.25    Types: Cigarettes   Smokeless tobacco: Never   Tobacco comments:    5 cigarettes a day  Vaping Use   Vaping status: Never Used  Substance and Sexual Activity   Alcohol use: No   Drug use: No   Sexual activity: Not Currently  Other Topics Concern   Not on file  Social History Narrative   Not on file   Social Drivers of Health   Financial Resource Strain: Low Risk (09/21/2023)   Received [facility]   Overall Financial  Resource Strain (CARDIA)    Difficulty of Paying Living Expenses: Not very hard  Food Insecurity: No Food Insecurity (09/21/2023)   Received from [facility]   Hunger Vital Sign    Within the past 12 months, you worried that your food would run out before you got the money to buy more.: Never true    Within the past 12 months, the food you bought just didn't last and you didn't have money to get more.: Never true  Transportation Needs: No Transportation Needs (09/21/2023)   Received from [facility]   PRAPARE - Transportation    Lack of Transportation (Medical): No    Lack of Transportation (Non-Medical): No  Physical Activity: Inactive (04/26/2019)   Exercise Vital Sign    Days of Exercise per Week: 0 days    Minutes of Exercise per Session: 0 min  Stress: No Stress Concern Present (04/26/2019)   [employer] of Occupational Health - Occupational Stress Questionnaire    Feeling of Stress : Not at all  Social Connections: Moderately Isolated (04/26/2019)   Social Connection and Isolation Panel    Frequency of Communication with Friends and Family: More than three times a week    Frequency of Social Gatherings with Friends and Family: More than three times a week    Attends Religious Services: 1 to 4 times per year    Active Member of [employer] or Organizations: No    Attends Banker Meetings: Never    Marital Status: Never married  Intimate Partner Violence: Not At Risk (04/17/2023)   Humiliation, Afraid, Rape, and Kick questionnaire    Fear of Current or Ex-Partner: No    Emotionally Abused: No    Physically Abused: No    Sexually Abused: No    Past Surgical History:  Procedure Laterality Date   A/V FISTULAGRAM Left 03/31/2022   Procedure: A/V Fistulagram;  Surgeon: Jama Cordella MATSU, MD;  Location: [facility] INVASIVE CV LAB;  Service: Cardiovascular;  Laterality: Left;   AV FISTULA PLACEMENT Left 02/12/2023   Procedure: LEFT ARM FISTULA CREATION;  Surgeon:  Serene Gaile ORN, MD;  Location: [facility] OR;  Service: Vascular;  Laterality: Left;   CHOLECYSTECTOMY     DIALYSIS/PERMA CATHETER INSERTION N/A 01/19/2024   Procedure: DIALYSIS/PERMA CATHETER INSERTION;  Surgeon: Marea Selinda RAMAN, MD;  Location: [facility] INVASIVE CV LAB;  Service: Cardiovascular;  Laterality: N/A;   LOWER EXTREMITY ANGIOGRAPHY Left 01/25/2019   Procedure: Lower Extremity Angiography;  Surgeon: Jama Cordella MATSU, MD;  Location: [facility] INVASIVE CV LAB;  Service: Cardiovascular;  Laterality:  Left;   RIGHT/LEFT HEART CATH AND CORONARY ANGIOGRAPHY Bilateral 02/15/2019   Procedure: RIGHT/LEFT HEART CATH AND CORONARY ANGIOGRAPHY;  Surgeon: Perla Evalene PARAS, MD;  Location: [facility] INVASIVE CV LAB;  Service: Cardiovascular;  Laterality: Bilateral;    Family History  Problem Relation Age of Onset   Hypertension Mother    Cerebral aneurysm Mother     Allergies  Allergen Reactions   Neosporin [Bacitracin-Polymyxin B] Rash   Sulfa Antibiotics Rash       Latest Ref Rng & Units 12/21/2023    3:01 PM 04/26/2023    7:43 AM 04/25/2023    5:20 AM  CBC  WBC 4.0 - 10.5 K/uL  7.0    Hemoglobin 12.0 - 15.0 g/dL 9.7  9.0  7.3   Hematocrit 36.0 - 46.0 % 31.4  28.5    Platelets 150 - 400 K/uL  157        CMP     Component Value Date/Time   NA 130 (L) 04/26/2023 0743   NA 132 (L) 03/06/2019 1418   K 3.9 04/26/2023 0743   CL 93 (L) 04/26/2023 0743   CO2 25 04/26/2023 0743   GLUCOSE 113 (H) 04/26/2023 0743   BUN 38 (H) 04/26/2023 0743   BUN 41 (H) 03/06/2019 1418   CREATININE 4.51 (H) 04/26/2023 0743   CALCIUM  8.0 (L) 04/26/2023 0743   PROT 8.1 04/17/2023 2207   ALBUMIN  2.5 (L) 04/17/2023 2207   AST 37 04/17/2023 2207   ALT 16 04/17/2023 2207   ALKPHOS 68 04/17/2023 2207   BILITOT 1.7 (H) 04/17/2023 2207   GFRNONAA 12 (L) 04/26/2023 0743     VAS US  ABI WITH/WO TBI Result Date: 04/17/2024  LOWER EXTREMITY DOPPLER STUDY Patient Name:  Guy Toney  Date of Exam:   04/14/2024 Medical Rec #:  [id number]   Accession #:    [id number] Date of Birth: 19-Sep-1975    Patient Gender: F Patient Age:   61 years Exam Location:  [facility] Procedure:      VAS US  ABI WITH/WO TBI Referring Phys: Cordella Shawl --------------------------------------------------------------------------------  Indications: Peripheral artery disease.  Vascular Interventions: Left angio PTA of the TP trunk and ATA. Comparison Study: 02/27/2019 Performing Technologist: Leafy Gibes RVS  Examination Guidelines: A complete evaluation includes at minimum, Doppler waveform signals and systolic blood pressure reading at the level of bilateral brachial, anterior tibial, and posterior tibial arteries, when vessel segments are accessible. Bilateral testing is considered an integral part of a complete examination. Photoelectric Plethysmograph (PPG) waveforms and toe systolic pressure readings are included as required and additional duplex testing as needed. Limited examinations for reoccurring indications may be performed as noted.  ABI Findings: +---------+------------------+-----+--------+--------+ Right    Rt Pressure (mmHg)IndexWaveformComment  +---------+------------------+-----+--------+--------+ Brachial 129                                     +---------+------------------+-----+--------+--------+ ATA      155               1.20 biphasic         +---------+------------------+-----+--------+--------+ PTA      201               1.56 biphasicNC       +---------+------------------+-----+--------+--------+ Great Toe121               0.94 Normal           +---------+------------------+-----+--------+--------+ +---------+------------------+-----+---------+-------+  Left     Lt Pressure (mmHg)IndexWaveform Comment +---------+------------------+-----+---------+-------+ ATA      157               1.22 triphasic        +---------+------------------+-----+---------+-------+ PTA      145                1.12 biphasic         +---------+------------------+-----+---------+-------+ Burnetta Oka               0.91 Normal           +---------+------------------+-----+---------+-------+ +-------+-----------+-----------+------------+------------+ ABI/TBIToday's ABIToday's TBIPrevious ABIPrevious TBI +-------+-----------+-----------+------------+------------+ Right  1.56       .94        1.06        1.05         +-------+-----------+-----------+------------+------------+ Left   1.22       91         1.13        .85          +-------+-----------+-----------+------------+------------+  Bilateral ABIs and TBIs appear essentially unchanged compared to prior study on 02/2019.  Summary: Right: Resting right ankle-brachial index is within normal range. The right toe-brachial index is normal.  Left: Resting left ankle-brachial index is within normal range. The left toe-brachial index is normal.  *See table(s) above for measurements and observations.  Electronically signed by Cordella Shawl MD on 04/17/2024 at 8:04:03 AM.    Final        Assessment & Plan:   1. Leg swelling (Primary) I had a long discussion with the patient regarding leg swelling.  The patient currently has ascites due to cirrhosis and has been requiring paracentesis.  In addition she is also on dialysis.  These 2 factors are very likely the cause of her ongoing leg swelling.  We also discussed there could be an underlying venous component so we will have her return for venous studies.  In addition she is advised to continue to again with conservative therapy.  This includes medical grade compression, elevation and activity to help with the swelling.  Will have her return at her convenience for venous evaluation.  2. PAD (peripheral artery disease) The patient does have some evidence of PAD.  Her noninvasive studies do not indicate any critical stenosis.  The previous study she had noted wounds to the indicate tibial level  disease which at this time this do not require intervention.  We can continue to monitor this.  3. Cirrhosis of liver with ascites, unspecified hepatic cirrhosis type (HCC) This is likely contributing greatly to her swelling  4. ESRD (end stage renal disease) (HCC) This likely contributes as well.   Current Outpatient Medications on File Prior to Visit  Medication Sig Dispense Refill   bictegravir-emtricitabine -tenofovir  AF (BIKTARVY ) 50-200-25 MG TABS tablet Take 1 tablet by mouth at bedtime. 30 tablet 0   calcium  acetate (PHOSLO ) 667 MG capsule Take 2,001 mg by mouth 3 (three) times daily.     dapsone  25 MG tablet Take 2 tablets (50 mg total) by mouth 2 (two) times daily. 30 tablet 0   gabapentin  (NEURONTIN ) 300 MG capsule Take 300 mg by mouth at bedtime.     traZODone  (DESYREL ) 50 MG tablet Take 50 mg by mouth at bedtime as needed for sleep.     Dextromethorphan -guaiFENesin  20-200 MG/20ML LIQD Take 10 mLs by mouth every 4 (four) hours as needed. (Patient not taking: Reported on 01/19/2024) 236 mL 0  metoprolol  succinate (TOPROL -XL) 25 MG 24 hr tablet Take 0.5 tablets (12.5 mg total) by mouth daily. (Patient not taking: Reported on 01/19/2024)     oxyCODONE -acetaminophen  (PERCOCET) 5-325 MG tablet Take 1 tablet by mouth every 6 (six) hours as needed for severe pain (pain score 7-10). (Patient not taking: Reported on 01/19/2024) 12 tablet 0   pantoprazole  (PROTONIX ) 40 MG tablet Take 40 mg by mouth daily. (Patient not taking: Reported on 04/14/2024)     torsemide  (DEMADEX ) 20 MG tablet Take 2 tablets by mouth daily. (Patient not taking: Reported on 04/14/2024)     No current facility-administered medications on file prior to visit.    There are no Patient Instructions on file for this visit. No follow-ups on file.   Teagen Bucio E Byan Poplaski, NP

## 2024-04-25 ENCOUNTER — Other Ambulatory Visit: Payer: Self-pay | Admitting: Internal Medicine

## 2024-04-25 DIAGNOSIS — R188 Other ascites: Secondary | ICD-10-CM

## 2024-04-27 ENCOUNTER — Ambulatory Visit
Admission: RE | Admit: 2024-04-27 | Discharge: 2024-04-27 | Disposition: A | Discharge status: Home / Self Care | Source: Ambulatory Visit | Attending: Internal Medicine | Admitting: Internal Medicine

## 2024-04-27 ENCOUNTER — Ambulatory Visit

## 2024-04-27 DIAGNOSIS — N186 End stage renal disease: Secondary | ICD-10-CM | POA: Diagnosis not present

## 2024-04-27 DIAGNOSIS — R188 Other ascites: Secondary | ICD-10-CM | POA: Insufficient documentation

## 2024-04-27 DIAGNOSIS — Z992 Dependence on renal dialysis: Secondary | ICD-10-CM | POA: Diagnosis not present

## 2024-04-27 MED ORDER — LIDOCAINE HCL (PF) 1 % IJ SOLN
8.0000 mL | Freq: Once | INTRAMUSCULAR | Status: DC
  Filled 2024-04-27: qty 8

## 2024-04-27 NOTE — Progress Notes (Deleted)
  Cardiology Office Note   Date:  04/27/2024  ID:  Rebecca Bridges, DOB [birth year], MRN [id number] PCP: [facility]  [facility] Providers Cardiologist:  Evalene Lunger, MD { Click to update primary MD,subspecialty MD or APP then REFRESH:1}    History of Present Illness Rebecca Bridges is a 48 y.o. 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  ***  Relevant CVD History -TTE 04/2023 LVEF 20 to 25% with global hypokinesis, moderately reduced RV function, mildly elevated PASP, moderate MR, moderate to severe TR - Monitor 02/2019 mean heart rate 85 bpm with 4 runs of SVT (longest 7 beats, max heart rate 160), rare ectopy. - Invasive coronary angiography 02/2019 nonobstructive CAD - LV thrombus 01/2019 complicated by left lower extremity thromboembolism status post thromboembolectomy   ROS: Pt denies any chest discomfort, jaw pain, arm pain, palpitations, syncope, presyncope, orthopnea, PND, or LE edema.  Studies Reviewed I have independently reviewed the patient's ECG, previous cardiac testing, previous medical records.  Physical Exam VS:  LMP 05/09/2019        Wt Readings from Last 3 Encounters:  04/14/24 111 lb (50.3 kg)  01/19/24 116 lb (52.6 kg)  08/13/23 110 lb 3.7 oz (50 kg)    GEN: No acute distress. NECK: No JVD; No carotid bruits. CARDIAC: ***RRR, no murmurs, rubs, gallops. RESPIRATORY:  Clear to auscultation. EXTREMITIES:  Warm and well-perfused. No edema.  ASSESSMENT AND PLAN NICM LBBB Moderate MR Moderate to severe TR History of LV thrombus complicated by thromboembolism to left lower extremity LBBB ESRD        {Are you ordering a CV Procedure (e.g. stress test, cath, DCCV, TEE, etc)?   Press F2        :[id number]}  Dispo:  ***  Signed, Caron Poser, MD

## 2024-04-27 NOTE — Procedures (Signed)
 PROCEDURE SUMMARY:  Successful US  guided paracentesis from left lateral abdomen.  Yielded 2.9 liters of amber fluid.  No immediate complications.  Patient tolerated well.  EBL = trace   Apolonia Ellwood CHRISTELLA Bal PA-C 04/27/2024 10:48 AM

## 2024-05-04 ENCOUNTER — Other Ambulatory Visit (INDEPENDENT_AMBULATORY_CARE_PROVIDER_SITE_OTHER): Payer: Self-pay | Admitting: Nurse Practitioner

## 2024-05-04 ENCOUNTER — Other Ambulatory Visit: Payer: Self-pay | Admitting: Internal Medicine

## 2024-05-04 DIAGNOSIS — R188 Other ascites: Secondary | ICD-10-CM

## 2024-05-04 DIAGNOSIS — M7989 Other specified soft tissue disorders: Secondary | ICD-10-CM

## 2024-05-07 NOTE — Progress Notes (Deleted)
  Cardiology Office Note   Date:  05/07/2024  ID:  Brittannie Tawney, DOB 07-22-1975, MRN [id number] PCP: [facility]  [facility] Providers Cardiologist:  Evalene Lunger, MD { Click to update primary MD,subspecialty MD or APP then REFRESH:1}    History of Present Illness Rebecca Bridges is a 48 y.o. 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  ***  Relevant CVD History -TTE 04/2023 LVEF 20 to 25% with global hypokinesis, moderately reduced RV function, mildly elevated PASP, moderate MR, moderate to severe TR - Monitor 02/2019 mean heart rate 85 bpm with 4 runs of SVT (longest 7 beats, max heart rate 160), rare ectopy. - Invasive coronary angiography 02/2019 nonobstructive CAD - LV thrombus 01/2019 complicated by left lower extremity thromboembolism status post thromboembolectomy   ROS: Pt denies any chest discomfort, jaw pain, arm pain, palpitations, syncope, presyncope, orthopnea, PND, or LE edema.  Studies Reviewed I have independently reviewed the patient's ECG, previous cardiac testing, previous medical records.  Physical Exam VS:  LMP 05/09/2019        Wt Readings from Last 3 Encounters:  04/14/24 111 lb (50.3 kg)  01/19/24 116 lb (52.6 kg)  08/13/23 110 lb 3.7 oz (50 kg)    GEN: No acute distress. NECK: No JVD; No carotid bruits. CARDIAC: ***RRR, no murmurs, rubs, gallops. RESPIRATORY:  Clear to auscultation. EXTREMITIES:  Warm and well-perfused. No edema.  ASSESSMENT AND PLAN NICM LBBB Moderate MR Moderate to severe TR History of LV thrombus complicated by thromboembolism to left lower extremity LBBB ESRD        {Are you ordering a CV Procedure (e.g. stress test, cath, DCCV, TEE, etc)?   Press F2        :[id number]}  Dispo:  ***  Signed, Caron Poser, MD

## 2024-05-08 ENCOUNTER — Ambulatory Visit (INDEPENDENT_AMBULATORY_CARE_PROVIDER_SITE_OTHER)

## 2024-05-08 ENCOUNTER — Ambulatory Visit (INDEPENDENT_AMBULATORY_CARE_PROVIDER_SITE_OTHER): Admitting: Vascular Surgery

## 2024-05-08 ENCOUNTER — Ambulatory Visit
Admission: RE | Admit: 2024-05-08 | Discharge: 2024-05-08 | Disposition: A | Discharge status: Home / Self Care | Source: Ambulatory Visit | Attending: Internal Medicine | Admitting: Internal Medicine

## 2024-05-08 DIAGNOSIS — Z992 Dependence on renal dialysis: Secondary | ICD-10-CM | POA: Insufficient documentation

## 2024-05-08 DIAGNOSIS — N186 End stage renal disease: Secondary | ICD-10-CM | POA: Insufficient documentation

## 2024-05-08 DIAGNOSIS — R188 Other ascites: Secondary | ICD-10-CM | POA: Diagnosis present

## 2024-05-08 MED ORDER — ALBUMIN HUMAN 25 % IV SOLN
25.0000 g | Freq: Once | INTRAVENOUS | Status: AC
  Administered 2024-05-08: 25 g via INTRAVENOUS

## 2024-05-08 MED ORDER — LIDOCAINE HCL (PF) 1 % IJ SOLN
10.0000 mL | Freq: Once | INTRAMUSCULAR | Status: AC
  Administered 2024-05-08: 10 mL via INTRADERMAL
  Filled 2024-05-08: qty 10

## 2024-05-08 MED ORDER — ALBUMIN HUMAN 25 % IV SOLN
INTRAVENOUS | Status: AC
  Filled 2024-05-08: qty 100

## 2024-05-08 NOTE — Procedures (Signed)
 PROCEDURE SUMMARY:  Successful image-guided paracentesis from the left abdomen.  Yielded 3.5 liters of clear, straw-colored peritoneal fluid.  No immediate complications.  EBL: zero Patient tolerated well.   Please see imaging section of Epic for full dictation.  Carlin LABOR Holston Oyama PA-C 05/08/2024 9:06 AM

## 2024-05-10 ENCOUNTER — Ambulatory Visit

## 2024-05-16 ENCOUNTER — Other Ambulatory Visit: Payer: Self-pay | Admitting: Internal Medicine

## 2024-05-16 DIAGNOSIS — R188 Other ascites: Secondary | ICD-10-CM

## 2024-05-18 ENCOUNTER — Ambulatory Visit: Admission: RE | Admit: 2024-05-18 | Discharge: 2024-05-18 | Attending: Internal Medicine | Admitting: Internal Medicine

## 2024-05-18 DIAGNOSIS — R188 Other ascites: Secondary | ICD-10-CM

## 2024-05-18 MED ORDER — LIDOCAINE HCL (PF) 1 % IJ SOLN
10.0000 mL | Freq: Once | INTRAMUSCULAR | Status: AC
  Administered 2024-05-18: 10 mL via INTRADERMAL
  Filled 2024-05-18: qty 10

## 2024-05-18 NOTE — Procedures (Signed)
 PROCEDURE SUMMARY:  Successful image-guided paracentesis from the left abdomen.  Yielded 2.9 liters of clear, straw-colored peritoneal fluid.  No immediate complications.  EBL: zero Patient tolerated well.   Please see imaging section of Epic for full dictation.  Carlin LABOR Quadry Kampa PA-C 05/18/2024 8:49 AM

## 2024-05-25 ENCOUNTER — Other Ambulatory Visit: Payer: Self-pay | Admitting: Internal Medicine

## 2024-05-25 DIAGNOSIS — R188 Other ascites: Secondary | ICD-10-CM

## 2024-05-26 ENCOUNTER — Ambulatory Visit
Admission: RE | Admit: 2024-05-26 | Discharge: 2024-05-26 | Disposition: A | Source: Ambulatory Visit | Attending: Internal Medicine | Admitting: Internal Medicine

## 2024-05-26 DIAGNOSIS — R188 Other ascites: Secondary | ICD-10-CM

## 2024-05-26 MED ORDER — LIDOCAINE HCL (PF) 1 % IJ SOLN
5.0000 mL | Freq: Once | INTRAMUSCULAR | Status: DC
  Filled 2024-05-26: qty 5

## 2024-05-26 NOTE — Procedures (Signed)
 PROCEDURE SUMMARY:  Successful image-guided paracentesis from the left abdomen.  Yielded 3.4 liters of clear, amber colored peritoneal fluid.  No immediate complications.  EBL: zero Patient tolerated well.   Please see imaging section of Epic for full dictation.  Carlin LABOR Kassidee Narciso PA-C 05/26/2024 11:32 AM

## 2024-06-05 ENCOUNTER — Other Ambulatory Visit: Payer: Self-pay | Admitting: Internal Medicine

## 2024-06-05 DIAGNOSIS — R188 Other ascites: Secondary | ICD-10-CM

## 2024-06-07 ENCOUNTER — Ambulatory Visit
Admission: RE | Admit: 2024-06-07 | Discharge: 2024-06-07 | Disposition: A | Discharge status: Home / Self Care | Source: Ambulatory Visit | Attending: Internal Medicine | Admitting: Internal Medicine

## 2024-06-07 DIAGNOSIS — N186 End stage renal disease: Secondary | ICD-10-CM | POA: Diagnosis not present

## 2024-06-07 DIAGNOSIS — Z992 Dependence on renal dialysis: Secondary | ICD-10-CM | POA: Insufficient documentation

## 2024-06-07 DIAGNOSIS — R188 Other ascites: Secondary | ICD-10-CM | POA: Diagnosis present

## 2024-06-07 MED ORDER — LIDOCAINE HCL (PF) 1 % IJ SOLN
5.0000 mL | Freq: Once | INTRAMUSCULAR | Status: AC
  Administered 2024-06-07: 5 mL via INTRADERMAL

## 2024-06-07 NOTE — Procedures (Signed)
 PROCEDURE SUMMARY:  Successful image-guided paracentesis from the left lower abdomen.  Yielded 2.7 liters of clear, amber fluid.  No immediate complications.  EBL: zero Patient tolerated well.   Specimen was not sent for labs.  Please see imaging section of Epic for full dictation.  Clotilda DELENA Hesselbach PA-C 06/07/2024 2:48 PM

## 2024-06-20 ENCOUNTER — Other Ambulatory Visit: Payer: Self-pay | Admitting: Internal Medicine

## 2024-06-20 DIAGNOSIS — R188 Other ascites: Secondary | ICD-10-CM

## 2024-06-22 ENCOUNTER — Ambulatory Visit
Admission: RE | Admit: 2024-06-22 | Discharge: 2024-06-22 | Disposition: A | Discharge status: Home / Self Care | Source: Ambulatory Visit | Attending: Internal Medicine | Admitting: Internal Medicine

## 2024-06-22 DIAGNOSIS — R188 Other ascites: Secondary | ICD-10-CM | POA: Insufficient documentation

## 2024-06-22 DIAGNOSIS — Z992 Dependence on renal dialysis: Secondary | ICD-10-CM | POA: Insufficient documentation

## 2024-06-22 DIAGNOSIS — N186 End stage renal disease: Secondary | ICD-10-CM | POA: Insufficient documentation

## 2024-06-22 MED ORDER — LIDOCAINE HCL (PF) 1 % IJ SOLN
10.0000 mL | Freq: Once | INTRAMUSCULAR | Status: AC
  Administered 2024-06-22: 10 mL via INTRADERMAL

## 2024-06-22 NOTE — Procedures (Signed)
 PROCEDURE SUMMARY:  Successful US  guided paracentesis from left lateral abdomen.  Yielded 2.9 liters of amber fluid.  No immediate complications.  Patient tolerated well.  EBL = trace   Rebekkah Powless M Catrice Zuleta PA-C 06/22/2024 5:00 PM

## 2024-07-04 ENCOUNTER — Other Ambulatory Visit: Payer: Self-pay | Admitting: Internal Medicine

## 2024-07-04 DIAGNOSIS — R188 Other ascites: Secondary | ICD-10-CM

## 2024-07-06 ENCOUNTER — Ambulatory Visit
Admission: RE | Admit: 2024-07-06 | Discharge: 2024-07-06 | Disposition: A | Discharge status: Home / Self Care | Source: Ambulatory Visit | Attending: Internal Medicine | Admitting: Internal Medicine

## 2024-07-06 DIAGNOSIS — Z992 Dependence on renal dialysis: Secondary | ICD-10-CM | POA: Diagnosis not present

## 2024-07-06 DIAGNOSIS — R188 Other ascites: Secondary | ICD-10-CM | POA: Insufficient documentation

## 2024-07-06 DIAGNOSIS — N186 End stage renal disease: Secondary | ICD-10-CM | POA: Diagnosis not present

## 2024-07-06 MED ORDER — LIDOCAINE HCL (PF) 1 % IJ SOLN
10.0000 mL | Freq: Once | INTRAMUSCULAR | Status: AC
  Administered 2024-07-06: 10 mL via INTRADERMAL

## 2024-07-06 MED ORDER — ALBUMIN HUMAN 25 % IV SOLN: 25.0000 g | Freq: Once | INTRAVENOUS | Status: AC

## 2024-07-06 MED ORDER — ALBUMIN HUMAN 25 % IV SOLN
INTRAVENOUS | Status: AC
  Administered 2024-07-06: 25 g via INTRAVENOUS
  Filled 2024-07-06: qty 100

## 2024-07-06 NOTE — Procedures (Signed)
 PROCEDURE SUMMARY:  Successful US  guided paracentesis from left lateral abdomen.  Yielded 3.4 liters of serous fluid.  No immediate complications.  Patient tolerated well.  EBL = trace   Malaisha Silliman CHRISTELLA Bal PA-C 07/06/2024 4:13 PM

## 2024-07-19 ENCOUNTER — Other Ambulatory Visit: Payer: Self-pay | Admitting: Internal Medicine

## 2024-07-19 DIAGNOSIS — R188 Other ascites: Secondary | ICD-10-CM

## 2024-07-20 ENCOUNTER — Ambulatory Visit: Admission: RE | Admit: 2024-07-20 | Discharge: 2024-07-20 | Attending: Internal Medicine | Admitting: Internal Medicine

## 2024-07-20 DIAGNOSIS — R188 Other ascites: Secondary | ICD-10-CM

## 2024-07-20 MED ORDER — LIDOCAINE HCL (PF) 1 % IJ SOLN
10.0000 mL | Freq: Once | INTRAMUSCULAR | Status: AC
  Administered 2024-07-20: 10 mL via INTRADERMAL

## 2024-07-20 MED ORDER — ALBUMIN HUMAN 25 % IV SOLN
INTRAVENOUS | Status: AC
  Filled 2024-07-20: qty 100

## 2024-07-20 MED ORDER — ALBUMIN HUMAN 25 % IV SOLN
25.0000 g | Freq: Once | INTRAVENOUS | Status: AC
  Administered 2024-07-20: 25 g via INTRAVENOUS

## 2024-07-20 NOTE — Procedures (Signed)
 PROCEDURE SUMMARY:  Successful image-guided paracentesis from the left lower abdomen.  Yielded 2.9 liters of amber fluid.  No immediate complications.  EBL: zero Patient tolerated well.   Please see imaging section of Epic for full dictation.  Thersia LULLA Rummer PA-C 07/20/2024 2:58 PM  \
